# Patient Record
Sex: Male | Born: 1940
Health system: Southern US, Community
[De-identification: ages and names within clinical notes are randomized; demographics above are authoritative.]

## PROBLEM LIST (undated history)

## (undated) DIAGNOSIS — C801 Malignant (primary) neoplasm, unspecified: Secondary | ICD-10-CM

## (undated) DIAGNOSIS — H409 Unspecified glaucoma: Secondary | ICD-10-CM

## (undated) DIAGNOSIS — G610 Guillain-Barre syndrome: Secondary | ICD-10-CM

## (undated) DIAGNOSIS — D649 Anemia, unspecified: Secondary | ICD-10-CM

## (undated) DIAGNOSIS — K5730 Diverticulosis of large intestine without perforation or abscess without bleeding: Secondary | ICD-10-CM

## (undated) DIAGNOSIS — Z8781 Personal history of (healed) traumatic fracture: Secondary | ICD-10-CM

## (undated) DIAGNOSIS — Z860100 Personal history of colon polyps, unspecified: Secondary | ICD-10-CM

## (undated) DIAGNOSIS — N401 Enlarged prostate with lower urinary tract symptoms: Secondary | ICD-10-CM

## (undated) DIAGNOSIS — J189 Pneumonia, unspecified organism: Secondary | ICD-10-CM

## (undated) DIAGNOSIS — E039 Hypothyroidism, unspecified: Secondary | ICD-10-CM

## (undated) DIAGNOSIS — N138 Other obstructive and reflux uropathy: Secondary | ICD-10-CM

## (undated) DIAGNOSIS — R011 Cardiac murmur, unspecified: Secondary | ICD-10-CM

## (undated) DIAGNOSIS — I2510 Atherosclerotic heart disease of native coronary artery without angina pectoris: Secondary | ICD-10-CM

## (undated) DIAGNOSIS — I6523 Occlusion and stenosis of bilateral carotid arteries: Secondary | ICD-10-CM

## (undated) DIAGNOSIS — H9319 Tinnitus, unspecified ear: Secondary | ICD-10-CM

## (undated) DIAGNOSIS — E785 Hyperlipidemia, unspecified: Secondary | ICD-10-CM

## (undated) DIAGNOSIS — Z8551 Personal history of malignant neoplasm of bladder: Secondary | ICD-10-CM

## (undated) DIAGNOSIS — I8393 Asymptomatic varicose veins of bilateral lower extremities: Secondary | ICD-10-CM

## (undated) DIAGNOSIS — I714 Abdominal aortic aneurysm, without rupture, unspecified: Secondary | ICD-10-CM

## (undated) DIAGNOSIS — I350 Nonrheumatic aortic (valve) stenosis: Secondary | ICD-10-CM

## (undated) DIAGNOSIS — I8390 Asymptomatic varicose veins of unspecified lower extremity: Secondary | ICD-10-CM

## (undated) DIAGNOSIS — Z8709 Personal history of other diseases of the respiratory system: Secondary | ICD-10-CM

## (undated) HISTORY — DX: Unspecified glaucoma: H40.9

## (undated) HISTORY — DX: Pneumonia, unspecified organism: J18.9

## (undated) HISTORY — DX: Asymptomatic varicose veins of unspecified lower extremity: I83.90

## (undated) HISTORY — DX: Cardiac murmur, unspecified: R01.1

## (undated) HISTORY — DX: Malignant (primary) neoplasm, unspecified: C80.1

## (undated) HISTORY — PX: TONSILLECTOMY: SUR1361

## (undated) HISTORY — DX: Guillain-Barre syndrome: G61.0

## (undated) HISTORY — DX: Hypothyroidism, unspecified: E03.9

## (undated) HISTORY — DX: Abdominal aortic aneurysm, without rupture, unspecified: I71.40

## (undated) HISTORY — DX: Hyperlipidemia, unspecified: E78.5

## (undated) HISTORY — PX: OTHER SURGICAL HISTORY: SHX169

---

## 1998-08-02 ENCOUNTER — Ambulatory Visit: Admission: RE | Admit: 1998-08-02 | Discharge: 1998-08-02 | Payer: Self-pay | Admitting: *Deleted

## 1999-12-10 DIAGNOSIS — Z8781 Personal history of (healed) traumatic fracture: Secondary | ICD-10-CM

## 1999-12-10 DIAGNOSIS — Z8709 Personal history of other diseases of the respiratory system: Secondary | ICD-10-CM

## 1999-12-10 DIAGNOSIS — J189 Pneumonia, unspecified organism: Secondary | ICD-10-CM

## 1999-12-10 DIAGNOSIS — G610 Guillain-Barre syndrome: Secondary | ICD-10-CM

## 1999-12-10 HISTORY — DX: Personal history of (healed) traumatic fracture: Z87.81

## 1999-12-10 HISTORY — DX: Pneumonia, unspecified organism: J18.9

## 1999-12-10 HISTORY — DX: Guillain-Barre syndrome: G61.0

## 1999-12-10 HISTORY — DX: Personal history of other diseases of the respiratory system: Z87.09

## 2000-04-28 ENCOUNTER — Encounter: Payer: Self-pay | Admitting: *Deleted

## 2000-04-28 ENCOUNTER — Inpatient Hospital Stay (HOSPITAL_COMMUNITY): Admission: AD | Admit: 2000-04-28 | Discharge: 2000-05-06 | Payer: Self-pay | Admitting: Family Medicine

## 2000-04-29 ENCOUNTER — Encounter: Payer: Self-pay | Admitting: *Deleted

## 2000-05-01 ENCOUNTER — Encounter: Payer: Self-pay | Admitting: Family Medicine

## 2000-05-01 HISTORY — PX: VIDEO ASSISTED THORACOSCOPY (VATS)/EMPYEMA: SHX6172

## 2000-05-02 ENCOUNTER — Encounter: Payer: Self-pay | Admitting: Family Medicine

## 2000-05-03 ENCOUNTER — Encounter: Payer: Self-pay | Admitting: Thoracic Surgery (Cardiothoracic Vascular Surgery)

## 2000-05-04 ENCOUNTER — Encounter: Payer: Self-pay | Admitting: Family Medicine

## 2000-05-05 ENCOUNTER — Encounter: Payer: Self-pay | Admitting: Thoracic Surgery (Cardiothoracic Vascular Surgery)

## 2000-05-05 ENCOUNTER — Encounter: Payer: Self-pay | Admitting: Family Medicine

## 2000-05-06 ENCOUNTER — Encounter: Payer: Self-pay | Admitting: Family Medicine

## 2000-05-09 ENCOUNTER — Encounter: Admission: RE | Admit: 2000-05-09 | Discharge: 2000-05-09 | Payer: Self-pay | Admitting: Family Medicine

## 2000-05-14 ENCOUNTER — Encounter: Payer: Self-pay | Admitting: Thoracic Surgery (Cardiothoracic Vascular Surgery)

## 2000-05-14 ENCOUNTER — Encounter
Admission: RE | Admit: 2000-05-14 | Discharge: 2000-05-14 | Payer: Self-pay | Admitting: Thoracic Surgery (Cardiothoracic Vascular Surgery)

## 2000-05-16 ENCOUNTER — Encounter: Admission: RE | Admit: 2000-05-16 | Discharge: 2000-05-16 | Payer: Self-pay | Admitting: Family Medicine

## 2000-05-16 ENCOUNTER — Encounter: Payer: Self-pay | Admitting: Family Medicine

## 2000-05-17 ENCOUNTER — Inpatient Hospital Stay (HOSPITAL_COMMUNITY): Admission: EM | Admit: 2000-05-17 | Discharge: 2000-05-29 | Payer: Self-pay | Admitting: Emergency Medicine

## 2000-05-17 ENCOUNTER — Encounter: Payer: Self-pay | Admitting: Family Medicine

## 2000-05-29 ENCOUNTER — Inpatient Hospital Stay (HOSPITAL_COMMUNITY)
Admission: RE | Admit: 2000-05-29 | Discharge: 2000-06-20 | Payer: Self-pay | Admitting: Physical Medicine & Rehabilitation

## 2000-06-24 ENCOUNTER — Encounter
Admission: RE | Admit: 2000-06-24 | Discharge: 2000-08-04 | Payer: Self-pay | Admitting: Physical Medicine & Rehabilitation

## 2000-07-25 ENCOUNTER — Emergency Department (HOSPITAL_COMMUNITY): Admission: EM | Admit: 2000-07-25 | Discharge: 2000-07-25 | Payer: Self-pay | Admitting: Emergency Medicine

## 2000-07-25 ENCOUNTER — Encounter: Payer: Self-pay | Admitting: Emergency Medicine

## 2000-11-06 ENCOUNTER — Encounter (INDEPENDENT_AMBULATORY_CARE_PROVIDER_SITE_OTHER): Payer: Self-pay | Admitting: Specialist

## 2000-11-06 ENCOUNTER — Other Ambulatory Visit: Admission: RE | Admit: 2000-11-06 | Discharge: 2000-11-06 | Payer: Self-pay | Admitting: Gastroenterology

## 2002-12-09 DIAGNOSIS — Z8551 Personal history of malignant neoplasm of bladder: Secondary | ICD-10-CM

## 2002-12-09 DIAGNOSIS — C801 Malignant (primary) neoplasm, unspecified: Secondary | ICD-10-CM

## 2002-12-09 HISTORY — DX: Malignant (primary) neoplasm, unspecified: C80.1

## 2002-12-09 HISTORY — DX: Personal history of malignant neoplasm of bladder: Z85.51

## 2003-06-07 ENCOUNTER — Encounter (INDEPENDENT_AMBULATORY_CARE_PROVIDER_SITE_OTHER): Payer: Self-pay | Admitting: Specialist

## 2003-06-07 ENCOUNTER — Observation Stay (HOSPITAL_COMMUNITY): Admission: RE | Admit: 2003-06-07 | Discharge: 2003-06-08 | Payer: Self-pay | Admitting: Urology

## 2003-06-07 HISTORY — PX: TRANSURETHRAL RESECTION OF BLADDER TUMOR WITH MITOMYCIN-C: SHX6459

## 2005-11-15 ENCOUNTER — Ambulatory Visit: Payer: Self-pay | Admitting: Gastroenterology

## 2005-11-29 ENCOUNTER — Ambulatory Visit: Payer: Self-pay | Admitting: Gastroenterology

## 2007-01-28 ENCOUNTER — Encounter: Admission: RE | Admit: 2007-01-28 | Discharge: 2007-01-28 | Payer: Self-pay | Admitting: Family Medicine

## 2007-03-26 ENCOUNTER — Ambulatory Visit: Payer: Self-pay | Admitting: Vascular Surgery

## 2007-12-29 ENCOUNTER — Emergency Department (HOSPITAL_COMMUNITY): Admission: EM | Admit: 2007-12-29 | Discharge: 2007-12-29 | Payer: Self-pay | Admitting: Emergency Medicine

## 2007-12-31 ENCOUNTER — Ambulatory Visit (HOSPITAL_BASED_OUTPATIENT_CLINIC_OR_DEPARTMENT_OTHER): Admission: RE | Admit: 2007-12-31 | Discharge: 2007-12-31 | Payer: Self-pay | Admitting: Orthopaedic Surgery

## 2007-12-31 HISTORY — PX: OTHER SURGICAL HISTORY: SHX169

## 2009-03-12 IMAGING — CR DG WRIST COMPLETE 3+V*R*
4 series · 4 of 4 positions shown · non-contrast
Comparison: none

CLINICAL DATA: 66-year-old who fell.
 RIGHT WRIST ? 4 VIEW:

[x wrist pa right]
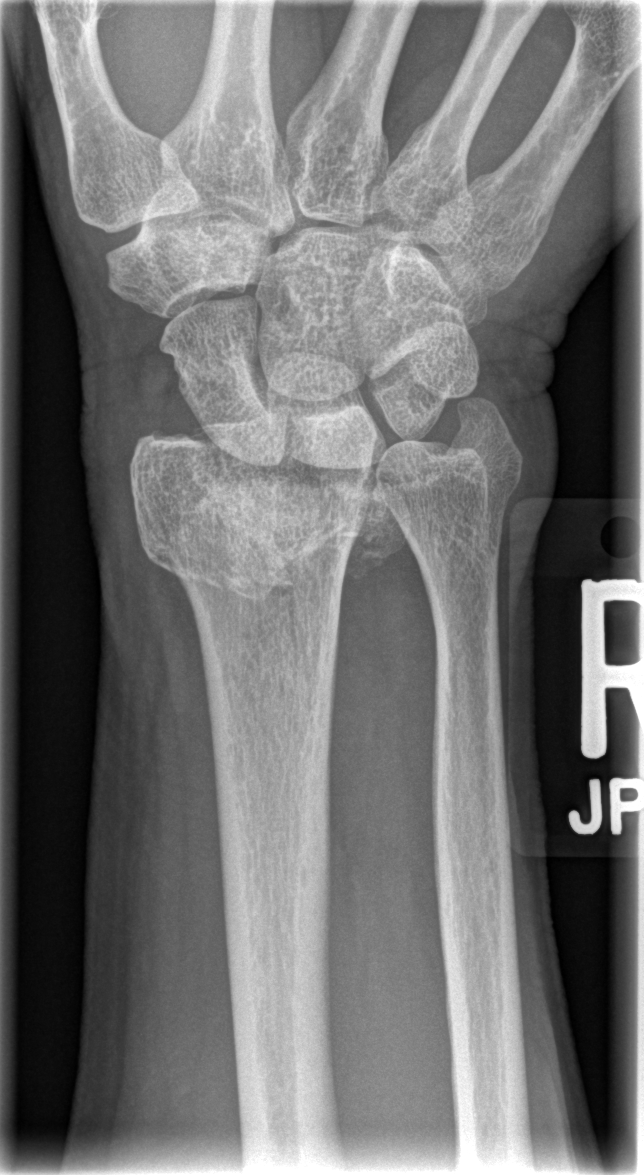

[x wrist obl right]
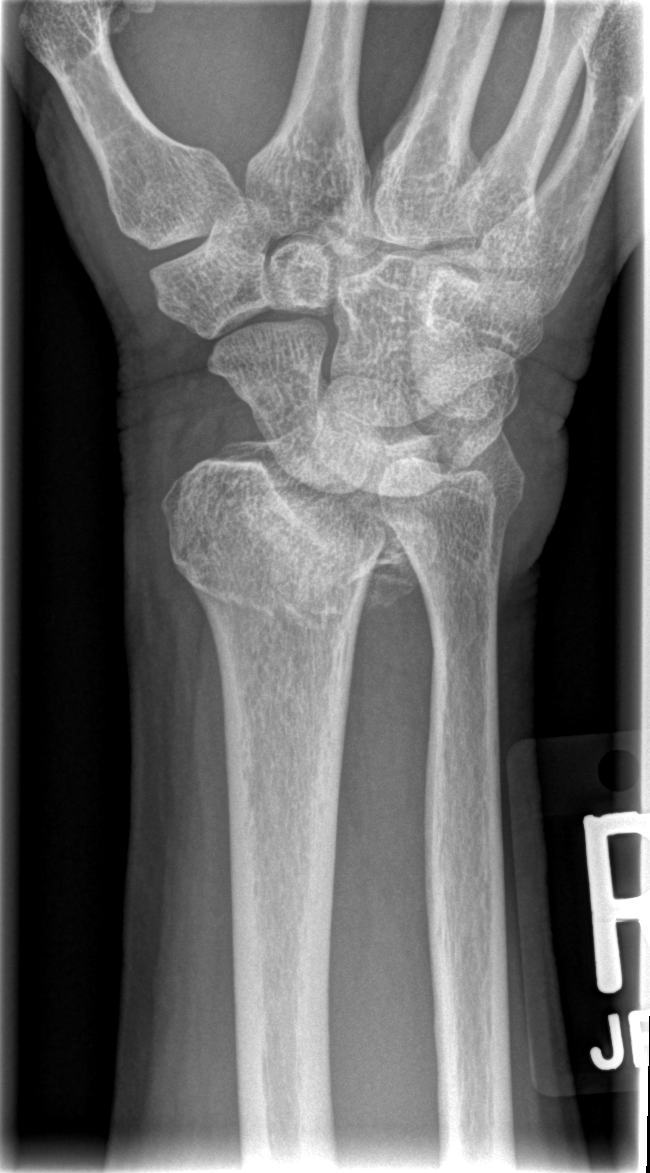

[x wrist lat right]
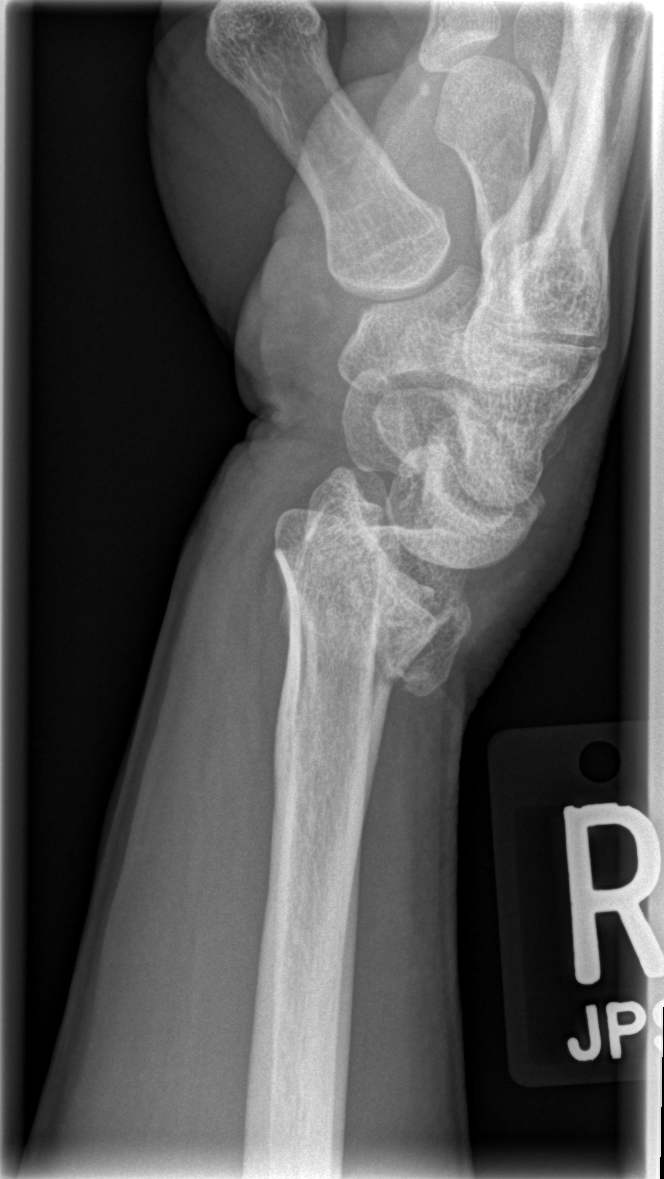

[x navicular]
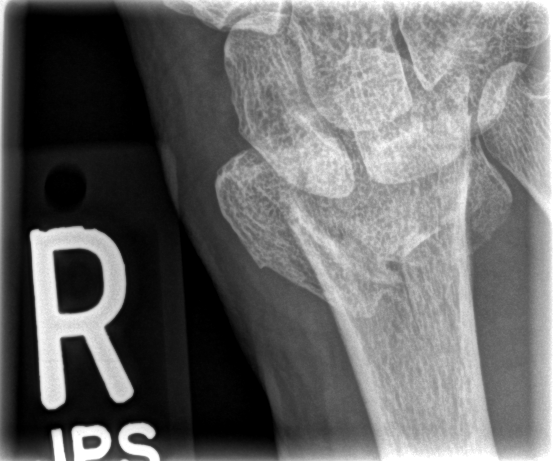

[4 of 4 positions shown; findings below may reference images not displayed]

FINDINGS: There is a comminuted intraarticular dorsally impacted distal radius fracture.  No ulnar fracture is seen.  The carpal bones are intact.
IMPRESSION: Dorsally impacted comminuted intraarticular distal radius fracture.

## 2009-03-29 ENCOUNTER — Encounter: Admission: RE | Admit: 2009-03-29 | Discharge: 2009-03-29 | Payer: Self-pay | Admitting: Family Medicine

## 2010-08-23 ENCOUNTER — Encounter (INDEPENDENT_AMBULATORY_CARE_PROVIDER_SITE_OTHER): Payer: Self-pay | Admitting: *Deleted

## 2011-01-08 NOTE — Letter (Signed)
Summary: Colonoscopy Date Change Letter  [facility]  [address] [place], [place] [address]   Phone: [phone]  Fax: [phone]      August 23, 2010 MRN: [id number]   Anthony Noble [address] [place], [place]  [address]   Dear Mr. Wolaver,   Previously you were recommended to have a repeat colonoscopy around this time. Your chart was recently reviewed by Dr. Judie Petit T. Russella Dar of [facility]. Follow up colonoscopy is now recommended in December 2016. This revised recommendation is based on current, nationally recognized guidelines for colorectal cancer screening and polyp surveillance. These guidelines are endorsed by the American Cancer Society, [employer] on Colorectal Cancer as well as numerous other major medical organizations.  Please understand that our recommendation assumes that you do not have any new symptoms such as bleeding, a change in bowel habits, anemia, or significant abdominal discomfort. If you do have any concerning GI symptoms or want to discuss the guideline recommendations, please call to arrange an office visit at your earliest convenience. Otherwise we will keep you in our reminder system and contact you 1-2 months prior to the date listed above to schedule your next colonoscopy.  Thank you,  Judie Petit T. Russella Dar, M.D.  [facility] [phone]

## 2011-04-23 NOTE — Op Note (Signed)
NAMENYLAN, NEVEL NO.:  [id number]   MEDICAL RECORD NO.:  [id number]          PATIENT TYPE:  AMB   LOCATION:  DSC                          FACILITY:  [facility]   PHYSICIAN:  Vanita Panda. Magnus Ivan, M.D.DATE OF BIRTH:  1941-08-04   DATE OF PROCEDURE:  12/31/2007  DATE OF DISCHARGE:                               OPERATIVE REPORT   PREOPERATIVE DIAGNOSIS:  Right unstable intra-articular distal radius  fracture.   POSTOPERATIVE DIAGNOSIS:  Right unstable intra-articular distal radius  fracture.   PROCEDURE:  Open reduction internal fixation of right unstable intra-  articular distal radius fracture.   IMPLANTS:  Synthes volar anatomic locking plate.   SURGEON:  Doneen Poisson, MD   ANESTHESIA:  1. Right shoulder interscalene block.  2. General anesthesia.   ANTIBIOTICS:  1 gram IV Ancef.   TOURNIQUET TIME:  1 hour 14 minutes.   COMPLICATIONS:  None.   INDICATIONS:  Briefly Mr. Xu is a right-hand-dominant 70 year old  who two days ago slipped on icy surface on his driveway falling on his  outstretched right wrist.  He was found to have a displaced intra-  articular distal radius fracture.  I saw him in the emergency room and  had the forearm closed reduction to improve the alignment.  He now  presents for definitive fixation of this intra-articular unstable  fracture.  The risks, benefits of surgery have been explained to him and  well understood.  He agrees proceed surgery.   PROCEDURE DESCRIPTION:  After informed consent, appropriate right arm  was marked and interscalene block was obtained by anesthesia.  He was  then brought to the operating room, placed supine on the operating  table.  General anesthesia was then obtained.  Of note, the patient had  asked me prior to coming to the room if I would provide a steroid  injection in a heel spur on his right foot while he was asleep and he  did put this on the permit and so I did oblige before  starting the case  and after cleaning his heel with alcohol provided a 1 mL 0.5% plain  Sensorcaine with 1 mL of 40 Depo-Medrol in the heel without  complications.  A band aid was placed over this.  I then placed a  nonsterile tourniquet around his upper right arm.  His arm was prepped  and draped from the upper arm down to the fingers with DuraPrep and  sterile drapes.  A time-out was called and he was identified as the  correct patient, correct extremity.  I then used Esmarch to wrap out the  wrist and hand.  The tourniquet inflated to 200 mmHg pressure.  I then  took a volar approach to the wrist and the skin was cut with knife and I  went down to the interval between the flexor carpi radialis tendon and  the radial artery.  I was easily able to see the pronator quadratus  tendon.  I took this down as a sleeve from radial to ulnar to expose the  volar cortex of this radius.  Slight reduction  maneuver was performed  and I was able to tease the fracture into reduced position.  I then used  a Synthes standard volar locking plate and fashioned this along the  volar cortex.  This was performed under direct fluoroscopy I then  secured bicortical screw in the slotted diaphyseal portion of the plate  to allow me for positioning the plate and along the metaphyseal and  articular aspect of the distal radius.  I then used locking screws to  further secure the fracture.  I put the wrist through a range of motion  under direct fluoroscopy and the wrist did move normally and there was  no widening of any interval of the carpal bones of the wrist.  I then  secured the wrist with two additional locking screws.  In the diaphysis.  I then copiously irrigated the wound and closed the pronator quadratus  back to this position with a 2-0 Vicryl suture.  I then used interrupted  3-0 Vicryl suture in subcutaneous tissue and interrupted 3-0 nylon on  the skin.  Adaptic followed by well-padded sterile dressing  applied.  I  put the patient's wrist in a removable Velcro wrist splint.  The  tourniquet was let down, the fingers did pink nicely.  The patient was  awakened, extubated, taken to recovery room in stable condition.  All  final counts were correct and no complications noted.      Vanita Panda. Magnus Ivan, M.D.  Electronically Signed     CYB/MEDQ  D:  12/31/2007  T:  12/31/2007  Job:  [id number]

## 2011-04-23 NOTE — Consult Note (Signed)
NAMEKALANI, STHILAIRE NO.:  [id number]   MEDICAL RECORD NO.:  [id number]          PATIENT TYPE:  EMS   LOCATION:  MAJO                         FACILITY:  [facility]   PHYSICIAN:  Vanita Panda. Magnus Ivan, M.D.DATE OF BIRTH:  [birth year]   DATE OF CONSULTATION:  12/29/2007  DATE OF DISCHARGE:                                 CONSULTATION   REASON FOR CONSULTATION:  Right displaced intra-articular distal radius  fracture.   HISTORY OF PRESENT ILLNESS:  Briefly, Marlinda Mike is a 70 year old right-hand-  dominant male who is a very active individual.  He slipped in the snow  on his driveway this morning and fell on an outstretched right wrist.  He had obvious deformity of his wrist and came to [facility]  Room for further evaluation and treatment.  X-ray showed a displaced  intra-articular distal radius fracture.  Orthopedic Surgery was called  for consultation for further management.  The patient denies any other  injuries, denies numbness and tingling in his fingers, and only reports  the pain from the deformity of the wrist.   PAST MEDICAL HISTORY:  1. Increased lipids.  2. Hypothyroid.   ALLERGIES:  SULFA.   MEDICATIONS:  Lipitor, Synthroid, baby aspirin, Omega-3, fish oil,  multivitamin.   SOCIAL HISTORY:  He denies smoking or drinking.  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   REVIEW OF SYSTEMS:  Negative for chest pain, shortness of breath, fever,  chills, nausea, vomiting.   VITAL SIGNS:  He is afebrile with stable vital signs.  GENERAL:  He is alert and oriented in no acute distress or obvious  discomfort.  EXTREMITIES:  Examination of the right upper extremity shows obvious  deformity with swelling at the wrist and an obvious dorsal displaced  fracture.  He has palpable pulses and normal sensation in his hand.  He  moves his fingers and thumb easily.  There is no tenderness of the elbow  or the  shoulder.  REMAINDER OF THE SYSTEMS:  Normal on exam.   X-rays reviewed and show a dorsally displaced intra-articular and  shortened distal radius fracture.   IMPRESSION:  This is a 70 year old right-hand-dominant male with a  displaced unstable right distal radius fracture.   PLAN:  In the emergency room, I was able to perform a hematoma block  after prepping the dorsum of the wrist with Betadine and alcohol, and  this was with 2% plain Xylocaine.  I then put him in 10 pounds of finger-  trap traction and then gradually increased this to 20 pounds in 5-minute  intervals.  Anatomically, he appeared to have much improved alignment of  the wrist; and I performed gentle reduction of this and placed him in a  well-padded plaster sugar-tong  splint.  Postreduction x-rays showed much better alignment.  He was  released from the ER in stable condition with close followup in my  office.  This will likely need an operative intervention, given this  gentleman's high level of activity, the unstable nature of the  fracture,  and this being his dominant wrist.  I will set up surgery at a later  date.      Vanita Panda. Magnus Ivan, M.D.  Electronically Signed     CYB/MEDQ  D:  12/29/2007  T:  12/29/2007  Job:  [id number]

## 2011-04-26 NOTE — Consult Note (Signed)
[place]  Patient:    Anthony Noble, Anthony Noble                        MRN: [id number] Proc. Date: 05/18/00 Adm. Date:  [id number] Disc. Date: [id number] Attending:  McDiarmid, Leighton Roach CC:         Family Practice                          Consultation Report  CHIEF COMPLAINT:  Cannot walk, vesiculations.  HISTORY OF PRESENT ILLNESS:  Anthony Noble is a 70 year old right-handed white male with a complicated history.  He was admitted in May of this year with right-sided empyema, had a VATS procedure to decorticate on the 21st.  On discharge, he did  well overall, but was active and then midway through this past week, began to feel weak.  He had some low back pain, Levaquin was stopped.  He had been on Lipitor for several years and that was stopped.  He has had multiple falls.  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  MRI of the lumbar spine and thoracic spine done to rule out epidural abscess and were largely unremarkable.  As mentioned, he was noted to be in SIADH.  He says that his arms are slightly weak this morning as well, although that is fairly new.  He has some tingling of his  feet.  REVIEW OF SYSTEMS:  Positive for back pain, leg pain and weakness.  No fever or  chills.  PAST MEDICAL HISTORY:  Significant for the recent VATS procedure for the right empyema, SIADH, hypothyroidism, tonsillectomy, which is remote, and dental extractions.  MEDICATIONS:  Currently, Synthroid, Percocet, Tylenol.  At home, he was also on  aspirin, vitamin E, iron, Pepto-Bismol, and he had been on Lipitor.  He had also been on Levaquin, that was stopped in case that might be causing toxicity.  ALLERGIES:  SEPTRA and LEVAQUIN.  SOCIAL HISTORY:  He is married with children.  Does smoke cigarettes, remotely. No alcohol use currently.  FAMILY HISTORY:  Positive for stroke and ovarian cancer, I believe.  PHYSICAL EXAMINATION:  Vital signs:   Temperature 97, pulse 84, respiratory rate 20, blood pressure 150/90.   HEAD:  Normocephalic, atraumatic.  NECK:  Supple without bruits.  LUNGS:  Clear to auscultation.  HEART:  Regular rate and rhythm, slightly tachycardic.  ABDOMEN:  Positive bowel sounds, soft and nontender.  EXTREMITIES:  Without edema.  NEUROLOGIC:  Mental status:  Awake and alert, fully oriented with normal language and comprehension.  Cranial nerves:  Pupils are equal and reactive, disk margins are poorly seen, visual fields are full to confrontation, extraocular movements aer intact.  Facial sensation is normal, facial motor activity is normal.  Hearing s intact.  Palate is symmetric and tongue is midline.  On the motor exam, I see no vesiculations anywhere when he is at rest.  He has mild bilateral upper extremity drift, fairly normal strength in the distal right upper extremity, but a little bit weak proximally, 3+/5, fairly weak in the left upper extremity with 3+ weakness  diffusely, lower extremities about 1/5 proximal, and 2-3/5 distal strength.  As  mentioned, no vesiculations to my exam.  Reflexes are absent diffusely.  He has no deep tendon reflexes.  He is areflexic.  Coordination:  Finger-to-nose is barely intact due to the weakness, cannot do heel-to-shin.  Sensory exam is  intact.  PRIMARY MODALITIES:  MRI of the thoracic and lumbar spine are unrevealing.  IMPRESSION:  Subacute progressive weakness in the lower extremities proceeding o the upper extremities, areflexia, and SIADH about three weeks after surgical procedure to me all add up to ______ syndrome.  RECOMMENDATIONS:  Lumbar puncture to look for elevated protein with normal white blood cells.  He needs to start on IVIg 400 mg/kg per day for five days.  We will need close monitoring of cardiac and respiratory function, subject to arrhythmias and respiratory compromise.  He will probably need long-term  rehabilitation. DD:  05/18/00 TD:  05/18/00 Job: [id number]

## 2011-04-26 NOTE — Discharge Summary (Signed)
[place]. [facility]  Patient:    Anthony Noble, Anthony Noble                        MRN: [id number] Adm. Date:  [id number] Disc. Date: 05/06/00 Attending:  McDiarmid, Anthony Roach. Dictator:   Anthony Noble, P.A. CC:         Anthony Noble. Anthony Noble, M.D.             Anthony Noble, M.D.                           Discharge Summary  DATE OF BIRTH:  [birth year]  ATTENDING SURGEON:  Dr. Charlett Noble.  PRIMARY CAREGIVER:  Dr. Rudi Noble, [facility].  FINAL DIAGNOSES: 1. Loculated right pleural effusion, empyema. 2. Postoperative anemia, not requiring transfusion. 3. Prominent nodal tissue in right hilar region on computerized tomographic    scan this admission.  Follow-up chest computerized tomography in three    months recommended.  SECONDARY DIAGNOSES: 1. Hypothyroidism. 2. Hyperlipidemia. 3. History of hematuria/urinary tract infection. 4. History of tobacco habituation, having quit in the early 1980s. 5. Status post tonsillectomy.  PROCEDURES:  May 01, 2000, bronchoscopy, right video-assisted thoracoscopy, drainage of a right pleural effusion, right minithoracotomy with decortication, Dr. Charlett Noble, surgeon.  The patient tolerated the procedure well and was transferred in stable and satisfactory condition to the recovery room.  DISPOSITION:  [name] is judged a suitable candidate for discharge on postoperative day #5, May 06, 2000.  He was having temperature elevations on his initial admission.  These have moderated and ameliorated by postoperative day #3.  He has been afebrile on postoperative days #4 and 5. He has been maintained on IV Rocephin during his whole hospitalization.  He will go home on p.o. Levaquin 500 mg daily for seven days.  His chest tube never did develop an air leak, and chest tubes were discontinued by postoperative day #4.  Pleural fluid showed no evidence of aerobic or anaerobic  contamination.  The studies were negative for Legionella, negative for acid-fast bacteria, negative for yeast and fungi.  Pathology showed granulation tissue with acute inflammation and necrosis.  No evidence of malignancy either in the decortication tissue or in the bronchial washings. Anthony Noble did experience postoperative anemia with a nadir, meaning low point, on postoperative day #6, a hemoglobin of 7.6.  It had risen to 7.8 by postoperative day #3.  He has not been symptomatic despite his anemia.  He will go home on iron supplementation.  He was relieved of all supplemental oxygen by postoperative day #2.  He was achieving oxygen saturations on room air at that time.  He was ambulating independently.  He has full GI tract function.  His appetite has returned.  His mental status has been clear throughout his hospitalization.  He has experienced no respiratory compromise, no cardiac dysrhythmias.  His incision is healing nicely.  There is no evidence of erythema, swelling, or drainage.  Pain is well controlled with oral analgesia.  DISCHARGE MEDICATIONS:  1. Percocet 5/325 1-2 tablets p.o. q.4-6h. p.r.n. pain.  2. Humibid L.A. 600 mg 2 p.o. b.i.d.  3. Levaquin 500 mg 1 p.o. q.d. x 7 days.  4. Chromagen twice daily with food.  5. Enteric-coated aspirin 81 mg daily.  6. Lipitor 10 mg at bedtime daily.  7. Synthroid 50 mcg daily.  8. Vitamin E 400 IU  daily.  9. Celebrex 100 mg b.i.d. 10. Zantac/Pepto-Bismol as needed. 11. Multivitamin daily.  ACTIVITY:  He may ambulate as tolerated.  He is asked not to drive for the next week.  DIET:  Low sodium, low cholesterol diet.  WOUND CARE:  He may bathe daily, which means shower, keeping his incisions clean and dry.  Sutures from chest tubes will be removed Wednesday, May 07, 2000, by his wife.  She is to paint the sutures with Betadine, place Steri-Strips over the incisions, and take out the sutures.  FOLLOW-UP:  He has an office  visit follow-up with Dr. Dorris Noble two weeks after his discharge.  He will get a chest x-ray one hour before the office visit is scheduled, and the chest x-ray will be taken at [facility] one hour before the visit.  HISTORY OF PRESENT ILLNESS:  Anthony Noble is a 70 year old male referred from the [facility]  He was demonstrated to have right-sided pneumonia with large right pleural effusion.  He describes 1-1/2 to 2 weeks of productive cough and right-sided chest pain.  He was treated with erythromycin May 15 through Apr 24, 2000.  Chest x-ray showed right-sided infiltrate on Apr 24, 2000.  He was started then on intramuscular Rocephin. He has had intermittent fevers during his illness.  Overall, he feels better than he did a week ago.  He did have some mild dyspnea, but this has resolved at the time of his admission.  Chest pain is well controlled with Percocet and Naprosyn.  He was referred to [facility] Apr 28, 2000, when follow-up x-rays showed large right pleural effusion and when his white blood cell count remained elevated at 17.9.  HOSPITAL COURSE:  Anthony Noble was admitted to [facility] Apr 28, 2000, and started on IV Rocephin.  His white cell count was 15.6 on admission.  He was having intermittent fevers in the 101-degree Fahrenheit range.  Chest x-ray showed loculated pleural effusion.  A CT scan of the chest was ordered on admission.  The CT of the chest, which was performed on Apr 29, 2000, showed a moderate amount of pleural effusion on the right.  The largest amount is loculated in the posterior and lateral component.  There is also a loculated medial component anteriorly.  There is also associated compressive atelectasis of the right lung, as well as a patchy right lung air space opacity with air bronchograms.  There is a mildly prominent soft tissue mass in the right hilar region at the  level of the distal right main pulmonary  artery.  No definite discrete mass is seen.  There is mild narrowing of the right mainstem bronchus due to compressive effects of the adjacent posterior loculated fluid.  A 1.3 cm right paratracheal lymph node is noted.  Minimal left pleural effusion noted posteriorly.  The mildly prominent nodal tissue in the right hilar region and right paratracheal region most likely represents mild reactive adenopathy.  A follow-up chest CT in three months would be useful for excluding an enlarging right hilar mass or progressive adenopathy.  Anthony Noble was seen here on Apr 29, 2000, by Dr. Edwyna Shell [facility], who recommended drainage of the right pleural effusion with decortication.  Due to scheduling difficulties, he was unable to perform the procedure.  Dr. Dorris Noble, in sequence, saw Anthony Noble.  Reviewed the risks and benefits.  Anthony Noble elected to undergo the treatment.  This was done on May 01, 2000.  A right video-assisted thoracoscopy with decortication and drainage of right pleural effusion was done.  The patient transferred the procedure well.  As dictated above, the pleural fluid was negative for aerobic, anaerobic, Legionella, acid-fast bacteria, yeast, and fungi.  Pathology of the tissue showed granulation tissue with acute inflammation and necrosis.  Abundant white blood cells were also seen.  On postoperative day #1, Anthony Noble was achieving 96% oxygen saturation on 2 L nasal cannula.  His hemoglobin was 8.6.  He was still with elevated temperature of 101.2.  He was in a sinus rhythm and remained in sinus rhythm throughout his hospitalization.  Chest tube had 100 cc per eight-hour shift, with no air leak.  On postoperative day #2, he was achieving 90% oxygen saturations on room air.  He still was febrile.  His hemoglobin was 7.6, a low point during his hospitalization.  He was off all  supplemental oxygen, ambulating.  Pain was controlled well with oral analgesia.  On postoperative day #3, his elevated temperatures were ameliorating.  He had been undergoing aggressive incentive spirometry throughout the postoperative period.  He was achieving 92% oxygen saturations on room air.  There was decreased chest tube output.  The PCA morphine pump was discontinued.  This had sustained him in the postoperative period.  Hemoglobin had risen from 7.6 on postoperative day #2 to 7.8.  On postoperative day #4, he was achieving 94% oxygen saturations on room air.  He was ambulating well.  His incision was healing nicely.  There was no drainage from the chest tube, no air leak.  The chest tube was discontinued, and he was judged a suitable candidate for discharge on postoperative day #5, going home on Levaquin, iron supplementation, and follow-up CT scan for the right hilar mass, thought to be a reactive lymph node currently, but would bear watching. DD:  05/05/00 TD:  05/06/00 Job: [id number]

## 2011-04-26 NOTE — H&P (Signed)
[place]. [facility]  Patient:    Anthony Noble, Anthony Noble                        MRN: [id number] Adm. Date:  [id number] Attending:  McDiarmid, Leighton Roach. Dictator:   Cheree Ditto, M.D. CC:         Dr. Tona Sensing or Jenetta Loges.             [facility]                         History and Physical  CHIEF COMPLAINT:  Right-sided chest pain.  HISTORY OF PRESENT ILLNESS:  A 70 year old male referred from [facility] for admission and evaluation with right-sided pneumonia and large pleural effusion.  He describes 1-1/2 to 2 weeks of productive cough and right-sided chest pain.  He was treated with azithromycin from 04/22/00 to 04/24/00.  After a chest x-ray revealed a right-sided infiltrate on 5/17, he was given IM Rocephin and sent home from clinic on Avelox.  He has had intermittent fevers during this illness.  Overall he feels better than he did a week ago.  His mild dyspnea has resolved.  His chest pain is well controlled with Percocet and Naprosyn.  He was referred to [facility] today when his follow-up chest x-ray showed a large right pleural effusion and his white blood count remained at 17.9.  PAST MEDICAL HISTORY:  Hyperlipidemia.  Hypothyroidism.  History of hematuria and possible UTI.  Obesity.  PAST SURGICAL HISTORY:  Tonsillectomy.  MEDICATIONS: 1. Lipitor 10 mg q.d. 2. Synthroid 0.05 q.d. 3. Multivitamin q.d. 4. Aspirin 81 mg q.d. 5. Vitamin E 400 mg q.d. 6. Zantac and Pepto-Bismol p.r.n.  ALLERGIES:  SEPTRA.  FAMILY HISTORY:  Mother died at 3 with renal failure and ovarian cancer.  His maternal grandmother had cancer.  His father is living at 87 with cataracts and is status post carotid endarterectomy.  SOCIAL HISTORY:  A 25 pack-year history of smoking.  He quit in the early 1980s.  He drinks one alcoholic beverage every Friday.  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  REVIEW OF  SYSTEMS:  No weight loss.  Occasional night sweats.  No fevers prior to this illness.  No hemoptysis.  No blood in the stool.  As above, otherwise unremarkable.  PHYSICAL EXAMINATION:  VITAL SIGNS:  Temperature 99.3, heart rate 100, respiratory rate 20, blood pressure 140/80, O2 saturations 91% on room air.  GENERAL:  He is pleasant and in no distress.  HEENT:  Pupils equal, round, and reactive to light and accommodation. Extraocular movements are intact.  Oropharynx is clear.  NECK:  Supple, without lymphadenopathy.  HEART:  Regular rate and rhythm with no murmur.  LUNGS:  Normal respiratory effort.  There are decreased breath sounds with crackles and dullness to percussion one-half to two-thirds the way up on the right.  ABDOMEN:  Mildly obese.  Positive bowel sounds.  Soft, nontender, no organomegaly.  EXTREMITIES:  No clubbing, cyanosis, or edema.  NEUROLOGIC:  Cranial nerves II-XII are intact.  He has normal strength and reflexes in the upper and lower extremities bilaterally.  LABORATORY DATA:  On 04/28/00 white count 17.9, hemoglobin 11.8, hematocrit 35.9, platelets 548.  MCV 87.5, RDW 14.  On 04/28/00 chest x-ray (PA and lateral) shows a right-sided infiltrate and effusion.  ASSESSMENT AND PLAN:  A 70 year old  male who presents with pneumonia and a right-sided pleural effusion.  #1 - RESPIRATORY:  We will start IV Levaquin.  Check decubitus.  X-ray to see if fluid layers out.  Consider thoracentesis.  Pleural effusion is less likely peripneumonic, but given history of smoking, postobstructive pneumonia from lung cancer is in the differential.  Follow-up chest x-ray and CBC in the a.m. Naprosyn and Darvocet for pleuritic chest pain.  #2 - HYPOTHYROIDISM:  Continue Synthroid.  #3 - HYPERLIPIDEMIA:  Continue Lipitor.  #4 - HISTORY OF GASTRITIS SYMPTOMS:  Given Pepcid while on Naprosyn. DD:  05/01/00 TD:  05/02/00 Job: [id number]

## 2011-04-26 NOTE — H&P (Signed)
[place]. [facility]  Patient:    Anthony Noble, Anthony Noble                        MRN: [id number] Adm. Date:  [id number] Disc. Date: [id number] Attending:  McDiarmid, Leighton Roach. Dictator:   Lavonia Dana, M.D.                         History and Physical  CHIEF COMPLAINT:  Lower extremity weakness.  HISTORY OF PRESENT ILLNESS:  This is a 70 year old who was admitted Apr 29, 1999 for a pneumonia that failed outpatient treatment and evolved into a right-sided empyema.  At that hospitalization he underwent VATS procedure, drainage of the effusion, and decortication.  He was then discharged on Levaquin and was doing well in follow-up until the past three days when he has developed relatively sudden onset of lower extremity weakness.  He has had multiple falls.  It is located mostly in his hips.  He has had some knee pain which he describes as more in the muscles surrounding his knee.  He has had no bowel or bladder incontinence.  He had been seen at [facility] and laboratory work was obtained on June 7, showing white count of 12.4, platelets of 967, hemoglobin of 10, and hematocrit of 30.  His sodium at that time was 133 and a TSH was normal, along with T4 and T3.  The patient notably has a history of hypothyroidism.  He was also discontinued on his Lipitor at that time.  He had follow-up with Dr. Dorris Fetch, his CVTS surgeon, who had ordered a CT of the chest and abdomen which results are pending at [facility].  REVIEW OF SYSTEMS:  He is not having any fevers.  He has been out playing golf up until three days ago.  He has described some possible visual changes, some heat intolerance.  Some slight constipation with bowel movements every other day.  No incontinence of urine, no incontinence of stool.  He has paresthesias in his waist and upper legs.  There is no radiating pain into his feet.  No URI symptoms.  He is having minimal chest discomfort from his procedure.   He has draining site from where his chest tube was taken out, which his wife has been caring for.  He is having no diplopia, no dyspnea or shortness of breath or orthopnea.  He has had a slight macular rash from the tape that has also shown up on his chest without pruritus.  He has had no personality changes or slurred speech.  PAST MEDICAL HISTORY:  Hyperlipidemia, hypothyroidism, history of hematuria, questionable UTI.  PAST SURGICAL HISTORY:  VATS procedure and decortication as above and tonsillectomy.  MEDICATIONS: 1. Lipitor 10 mg q.d. which is discontinued. 2. Synthroid 0.05 mg q.d. 3. Multivitamin. 4. Aspirin. 5. Vitamin E. 6. Zantac. 7. Pepto-Bismol p.r.n.  ALLERGIES:  SEPTRA.  FAMILY HISTORY:  His mother died at 26 with renal failure and ovarian cancer. Paternal grandmother had cancer.  Father living at 67 with cataracts.  Had carotid endarterectomy.  SOCIAL HISTORY:  He has a 24-pack-year history of smoking, quit in the early 80s.  He drinks one alcoholic beverage a day.  He plays golf avidly.  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  PHYSICAL EXAMINATION:  VITAL SIGNS:  He is afebrile, blood pressure 151/92, heart rate 192, 97% on room air.  GENERAL:  He is a well-appearing gentleman in no distress lying on his left side because his legs ache if he lays on his back.  HEENT:  Normocephalic, atraumatic.  TMs clear bilaterally.  Oropharynx moist, no erythema.  NECK:  Supple, no JVD, no lymphadenopathy.  CHEST:  Clear to auscultation bilaterally.  No wheezes, rales, or rhonchi.  He has scars from his thoracotomy and a healing chest tube wound which is no drainage or erythema.  CARDIOVASCULAR:  Normal S1, S2, regular rhythm.  ABDOMEN:  Soft, positive bowel sounds.  There is ecchymosis from heparin injection.  EXTREMITIES:  Lower extremities show no evidence of atrophy.  There are fasciculations noted in the calves bilaterally and in his  quadriceps intermittently.  NEUROLOGIC:  Extraocular movements are intact.  His pupils are equal, round, and reactive to light.  Funduscopic:  His discs appear normal bilaterally. Tongue is midline.  He has a normal gag.  No facial asymmetry.  Strength in upper extremities is equal, 5/5 bilaterally.  His upper extremity reflexes are symmetric.  Lower extremity reveals normal strength in his ankle dorsiflexors and plantar flexors.  He has downgoing Babinskis bilaterally.  He has inability to raise his entire leg off of the bed, left is slightly stronger than the right, but his proximal hip muscles show significant weakness bilaterally.  His sensation is grossly intact.  We were unable to elicit his patellar reflexes bilaterally.  LABORATORY DATA:  Sodium 115, potassium 4.1, chloride 81, bicarb 24, glucose 115, BUN 15, creatinine 0.7, calcium 8.6, total protein 7.8, albumin 3, AST 16, ALT 35, alkaline phosphatase 124, bilirubin 0.7.  White count 18.2, hemoglobin 10.7, hematocrit 32, MCV 79.5, platelets 967, 86% neutrophils, no eosinophilia.  ASSESSMENT/PLAN:  A 70 year old with recent history of pulmonary empyema, status post decortication and drainage, who now presents with acute onset of proximal leg weakness bilaterally and leukocytosis.  Most worrisome would be epidural abscess or other infectious process or other mass process of the spinal cord.  Will immediately get MRI of the T and L spine to rule out anatomic lesion.  If positive, we will consult neurosurgery.  Other possibilities could be a myositis secondary to medications.  It appears this is not thyroid related.  Will check a CK.  Will consider neurologic consult. In the meantime we will admit for further observation.DD:  05/17/00 TD:  05/17/00 Job: [id number]

## 2011-04-26 NOTE — Op Note (Signed)
   NAME:  Anthony Noble, Anthony Noble                      ACCOUNT NO.:  [id number]   MEDICAL RECORD NO.:  [id number]                   PATIENT TYPE:  AMB   LOCATION:  DAY                                  FACILITY:  [facility]   PHYSICIAN:  Excell Seltzer. Annabell Howells, M.D.                 DATE OF BIRTH:  08-19-1941   DATE OF PROCEDURE:  06/07/2003  DATE OF DISCHARGE:                                 OPERATIVE REPORT   PREOPERATIVE DIAGNOSIS:  Bladder tumor.   POSTOPERATIVE DIAGNOSIS:  Bladder tumor.   PROCEDURES:  1. Cystoscopy.  2. Transverse resection of bladder tumor (approximately 3 cm).  3. Instillation of mitomycin-C.   SURGEON:  Excell Seltzer. Annabell Howells, M.D.   ASSISTANT:  Melvyn Novas, M.D.   ANESTHESIA:  General.   ESTIMATED BLOOD LOSS:  Minimal.   DRAINS:  Foley catheter.   SPECIMENS:  TUR of the bladder chips.   DESCRIPTION OF PROCEDURE:  Following the administration of antibiotics and  anesthesia, the patient was prepped and draped in the dorsal supine position  and a cystoscopy was performed using 22-French sheath and 12_________.  The  entire bladder was visualized.  Both ureteral orifices were located in the  normal anatomic positions.  A questionable 3 cm papillary-appearing tumor  was present on the right lateral bladder wall.  There were no mucosal  abnormalities.  Following cystoscopy the cystoscope was removed.  The  urethra was calibrated to 30-French.  A 28-French resectoscope sheath was  placed using the obturator.  We proceeded to resect the tumor in its  entirety, using pier-cutting current.  The base and edges of the resection  bladder was then fulgurated, using Bovie cautery.  The bladder tips were  evacuated using Toomey syringe.  The bladder was then reinspected; there was  no evidence of any bleeding.  There were no retained tumor chips.  A Foley  catheter was then placed and the bladder emptied.  Then 30 mg of mitomycin-C  30 cc was then instilled into the bladder through the  Foley catheter, and  then the Foley catheter was clamped.  The procedure was then terminated.    DISPOSITION:  The patient was taken to the recovery room in stable  condition.  He will have his Foley catheter unclamped in 30 min.      Melvyn Novas, M.D.                      Excell Seltzer. Annabell Howells, M.D.    DK/MEDQ  D:  06/07/2003  T:  06/07/2003  Job:  [id number]

## 2011-04-26 NOTE — Discharge Summary (Signed)
. [facility]  Patient:    Anthony Noble, Anthony Noble                   MRN: [id number] Adm. Date:  [id number] Disc. Date: 06/20/00 Attending:  Herold Harms Dictator:   Mcarthur Rossetti. Angiulli, P.A. CC:         Daniel L. Thomasena Edis, M.D.             Catherine A. Orlin Hilding, M.D.             Wayne A. Sheffield Slider, M.D.                           Discharge Summary  DISCHARGE DIAGNOSES:  1. Guillain-Barre syndrome.  2. Syndrome of inappropriate antidiuretic hormone, resolved.  3. Hypothyroidism.  4. History of video assisted thoracoscopy procedure for right empyema,     May 2001.  HISTORY OF PRESENT ILLNESS: This patient is a 70 year old white male admitted May 17, 2000, recent admission Apr 08, 2000 for right-sided empyema and VATS procedure.  He was discharged home doing well until he developed increased weakness and gait disorder x 1 week.  On evaluation MRI of the thoracic, lumbar, and sacral spine was essentially unremarkable.  Neurosurgery (Dr. Orlin Hilding) saw the patient and lumbar puncture was carried out, with protein 173, glucose 69.  Suspected was Guillain-Barre syndrome and the patient was placed on IVIG therapy.  Ongoing hyponatremia.  December 24, 1999 treated for SIADH, 1000 cc fluid restriction.  Sodium improved to 134.  Lasix was slowly decreased.  He was moderate assistance for bed mobility, total assistance for transfers.  No chest pain or shortness of breath.  Wearing bilateral foot splints 8 a.m. to 8 p.m.  Subcutaneous Lovenox for deep vein thrombosis prophylaxis.  Latest hemoglobin 10, hematocrit 28.  He was admitted for comprehensive rehabilitation program.  PAST MEDICAL HISTORY: Hypothyroidism.  PAST SURGICAL HISTORY:  1. History of dental extractions.  2. Tonsillectomy.  3. VATS procedure, Apr 08, 2000, for right empyema.  ALLERGIES:  1. TRIMETHOPRIM.  2. SULFA.  PRIMARY CARE PHYSICIAN: Rudi Heap, M.D., [place], [place].  MEDICATIONS PRIOR TO ADMISSION:  1. Aspirin q.d.  2. Synthroid.  3. Vitamin E.  4. Lipitor.  SOCIAL HISTORY: [place] wife and 82 year old daughter.  Independent prior to admission, since his VATS procedure.  Employed as a Medical illustrator.  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  A second daughter, also in [place], [place].  HOSPITAL COURSE: The patient did well on rehabilitation services with therapies initiated on a b.i.d. basis.  The following issues were followed during the patients rehabilitation course.  Pertaining to [name] Guillain-Barre syndrome, this remained stable with progressive excellent overall progress, followed by neurology services.  He was on subcutaneous Lovenox for deep vein thrombosis prophylaxis and this was discontinued after his mobility improved.  His quadriceps strength greatly improved at 4/5.  He had some decreased knee extension.  He was now independent in his room for transfers, ambulating extended distances of 50 feet with contact guard assistance.  He was modified independent for activities of daily living.  He would receive ongoing therapies.  His hyponatremia had resolved, with latest sodium level of 137.  His fluid restrictions were discontinued.  He had completed his diuretics.  He continued on his hormone supplement for hypothyroidism as prior to hospital admission.  He had no bowel or bladder disturbances.  Full family teaching was  completed.  All day passes went well. The plan was to be discharged to home.  The latest laboratories show a sodium of 137, potassium 4.0, BUN 13, creatinine 0.8.  Hemoglobin 11.2, hematocrit 33.3, WBC 6.  DISCHARGE MEDICATIONS:  1. Vioxx 25 mg q.d.  2. Multivitamin q.d.  3. Synthroid 50 mcg q.d.  4. Darvocet-N 100 as-needed for pain.  DISCHARGE ACTIVITY: As tolerated, with walker and contact guard for assistance.  DISCHARGE DIET: Regular.  SPECIAL INSTRUCTIONS: No driving.   Ongoing therapies as per rehabilitation services.  FOLLOW-UP: Follow up with Dr. Orlin Hilding of neurologg services, Dr. Dorothy Puffer to take over medical care.  Follow up with Dr. Ellwood Dense with rehabilitation services as-needed. DD:  06/19/00 TD:  06/19/00 Job: [id number]

## 2011-04-26 NOTE — Op Note (Signed)
[place]. [facility]  Patient:    Anthony Noble, Anthony Noble                        MRN: [id number] Proc. Date: 05/01/00 Adm. Date:  [id number] Disc. Date: [id number] Attending:  McDiarmid, Leighton Roach.                           Operative Report  PREOPERATIVE DIAGNOSIS:  Complex right peripneumonic effusion.  POSTOPERATIVE DIAGNOSIS:  Right empyema.  PROCEDURE:  Bronchoscopy, right VATS, decortication.  SURGEON:  Salvatore Decent. Dorris Fetch, M.D.  ASSISTANT:  Sherrie George, P.A.  ANESTHESIA:  General.  FINDINGS:  Complicated early organizing empyema with thick ____ and visceral parietal peel with multiple loculated fluid collections.  Specimen sent for culture and pathology.  Bronchoscopy was within normal limits.  CLINICAL NOTE:  The patient is a 70 year old gentleman who has had a two to three week history of bronchitis/walking pneumonia.  He was treated with antibiotics as an outpatient, but had persistent fevers.  He was also found to have an elevated white blood cell count.  A chest x-ray was obtained which showed a right peripneumonic effusion.  The patient was admitted and started on IV antibiotics, and had a chest CT obtained.  The chest CT revealed a complex peripneumonic effusion, possible empyema in the right chest.  The patient was initially seen in consultation by D. Karle Plumber, M.D. who recommended that VATS approach under general anesthesia, and felt that a chest tube would not be adequate.  The patient then was referred to me for surgical consideration.  I discussed the indications, risks, benefits, and alternative treatments in detail with the patient and his family.  He understood the risks and agreed to proceed.  DESCRIPTION OF PROCEDURE:  The patient was brought to the preoperative holding area on May 01, 2000.  Arterial blood pressure monitoring and intravenous access were obtained.  The patient was taken to the operating room, anesthetized, and  intubated with a single lumen endotracheal tube.  Flexible fiberoptic bronchoscopy was performed via the endotracheal tube.  It revealed normal endobronchial anatomy and no endobronchial lesions.  Bronchoalveolar lavage was performed of the right lower and middle lobe bronchi, and the specimen was sent for cultures for bacteria, fungus, AFB, and virus.  The patient then was reintubated with a double lumen endotracheal tube and placed in a left lateral decubitus position.  The right chest was prepped and draped in the usual fashion.  An incision was made in the right chest, in the mid axillary line at the 8th intercostal space.  It was carried through the skin and subcutaneous tissue. Hemostasis was achieved with cautery.  A single lung ventilation of the left lung was carried out and the right lung was deflated.  The chest was entered bluntly using a hemostat.  A port was inserted and the thoracoscope was inserted into the chest.  It was obvious immediately that there was a complex multiloculated effusion with thick peels on both the visceral and parietal pleura.  Fluid was drained and sent for cultures.  A second incision was made.  This was 1.5 cm long.  It was located at approximately the 5th interspace posteriorly.  Instrumentation was placed through this incision and stripping of the parietal pleural peel was initiated.  This resulted in considerable bleeding.  It was a thick peel, but it did strip off easily.  Specimens were sent for  both fungal, bacterial, viral, and AFB cultures, as well as to pathology.  An extensive parietal pleurectomy was performed.  The majority of the peel was located in the mid half of the chest wall, extending posteriorly and anteriorly.  The apex and base of the parietal pleural surface were relatively spared, as were the apex of the lung and the lower lobe basilar segments.  Attempts to strip the visceral pleural peel were unsuccessful via this  limited incision.  Therefore, the incision was lengthened to approximately 5 cm in length.  The latissimus was divided.  The serratus was mobilized anteriorly. The intercostal muscles were divided.  A laminar spreader was used to spread the ribs.  No ribs were cut or broken.  Using the thoracoscope for visualization, the additional room for instrumentation allowed stripping of the visceral peel, and this was most heavily concentrated on the superior segment of the lower lobe, as well as the right middle lobe.  The basilar segments were relatively spared posteriorly.  After stripping the pleural peel, there was some air leakage from the lung when it was inflated.  The chest was irrigated with a large amount of warm normal saline containing vancomycin.  Inspection was made for hemostasis. There was diffuse oozing from the chest wall.  A 36-French straight chest tube was placed anteriorly, and directed to the apex, and a 36-French right angle chest tube was placed posteriorly and directed into the paravertebral gutter. The chest tubes were placed to suction.  The scope was removed.  The mini-thoracotomy incision was closed with running 0 Vicryl in the serratus area, a running 0 Vicryl in the latissimus fascia, and 2-0 Vicryl subcutaneous suture, and a 3-0 Vicryl subcuticular suture.  All sponge, needle, and instrument counts were correct at the end of the procedure.  There were no intraoperative complications.  The patient was taken from the operating room to the postanesthetic care unit, extubated, and in stable condition. DD:  05/01/00 TD:  05/06/00 Job: [id number]

## 2011-04-26 NOTE — Discharge Summary (Signed)
[place]  Patient:    Anthony Noble, Anthony Noble                   MRN: [id number] Adm. Date:  [id number] Disc. Date: 05/29/00 Attending:  Tobin Chad Dictator:   Gwenlyn Perking, M.D. CC:         Monica Becton, M.D.             [employer] [phone]             Santina Evans A. Orlin Hilding, M.D.             Daniel L. Thomasena Edis, M.D.                           Discharge Summary  SERVICE:  [facility].  RESIDENTS: 1. Wyline Copas, M.D. 2. Gwenlyn Perking, M.D.  ADMITTING DIAGNOSES: 1. Lower extremity muscle weakness. 2. Hyponatremia. 3. History of hyperlipidemia. 4. History of hypothyroidism. 5. History of hematuria. 6. History of heme positive stools. 7. Recent history of a severe right-sided pneumonia with empyema status post    decortication and VATs procedure.  DISCHARGE DIAGNOSES: 1. Guillain-Barre syndrome, resolving. 2. Syndrome of inappropriate antidiuretic hormone secretions (SIADH). 3. Otherwise see above.  REFERRING PHYSICIAN:  Monica Becton, M.D. at [facility].  CONSULTANTS: 1. Catherine A. Orlin Hilding, M.D., neurology. 2. Rande Brunt. Thomasena Edis, M.D., physical rehabilitation medicine.  PROCEDURES: 1. Magnetic resonance imaging of the lumbar and thoracic spine on    May 17, 2000. 2. Lumbar puncture on May 18, 2000.  HISTORY OF PRESENT ILLNESS:  [name] is a 70 year old white male who was just recently admitted Apr 28, 2000 for a pneumonia that failed outpatient treatment.  It evolved into a right-sided empyema.  He comes in with a chief complaint to [facility] of lower extremity weakness.  At the previous hospitalization, he underwent VATs procedure, drainage of the effusion, as well as a decortication procedure.  He was subsequently discharged on Levaquin and was doing well in follow-up until  the past three days prior to presentation to the emergency room, when he has developed relatively sudden onset of lower extremity weakness.  He has had multiple falls and is located mostly in his hips.  He has had some knee pain which he describes more in the muscle surrounding his knee.  He has had no bowel or bladder incontinence.  He had been seen at [facility], Dr. Monica Becton, and laboratory work had been obtained on May 15, 2000 showing a white count of 12.4, platelets of 967,000, hemoglobin of 10, and hematocrit of 30. His sodium at that time was 133 and TSH was normal along with T4 and T3.  The patient notably has a history of hypothyroidism.  He was also discontinued on his Lipitor at that time.  He had follow-up with Dr. Viviann Spare C. Hendrickson, his Physicist, medical, who had ordered a CT of the chest and abdomen which results are pending at the time of presentation.  REVIEW OF SYSTEMS:  The patient denies a history of fevers.  He has been out playing golf up until three days ago.  He describes some possible visual changes, some heat intolerance, some slight constipation with bowel movements. No incontinence of urine, no incontinence of stool.  He has paresthesias  in his waist and upper legs which he describes as needle pricking sensation. There is no radiating pain into his feet or back.  No upper respiratory symptoms.  He is having minimal chest discomfort from his procedure.  He has a draining site from his chest tube which was taken out, which his wife had been taking care of.  His wife is R.N. occupational Geneticist, molecular.  He is having no diplopia, no dyspnea, no shortness of breath, no orthopnea.  He has had a macular rash from the tape that has also shown up on his chest without pruritus.  He has had no personality changes or slurred speech.  PAST MEDICAL HISTORY:  Again, significant for hyperlipidemia, hypothyroidism, history of hematuria,  questionable UTI.  PAST SURGICAL HISTORY:  VATs procedure.  Decortication as above. Tonsillectomy.  MEDICATIONS: 1. Lipitor 10 mg p.o. q.d. which was discontinued. 2. Synthroid 0.05 mg p.o. q.d. 3. Multivitamins. 4. Aspirin. 5. Vitamin E. 6. Zantac. 7. Pepto-Bismol p.r.n.  ALLERGIES:  SEPTRA, SULFA.  FAMILY HISTORY:  His mother died at 43 with renal failure and ovarian cancer. Paternal grandmother had cancer.  Father living at 27 with cataracts.  He had carotid endarterectomy.  SOCIAL HISTORY:  He has a 24-year history of smoking and quit in the early 80s.  He drinks one alcoholic beverage a day.  He plays gold avidly.  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  PHYSICAL EXAMINATION:  VITAL SIGNS:  Vital signs stable and afebrile.  Blood pressure 151/92, heart rate 102.  O2 saturation is 97% on room air.  GENERAL:  Well-developed, well-nourished gentleman lying on his left side because of legs ache if he lays on his back.  HEENT:  Normocephalic and atraumatic.  Pupils are equal, round and reactive to light and accommodation.  Tympanic membranes are clear bilaterally. Oropharynx is moist.  No erythema.  NECK:  Supple.  No JVD.  No lymphadenopathy.  CHEST:  Clear to auscultation bilaterally.  No wheezes, rales, or rhonchi.  He has scar from his thoracotomy.  A healing chest tube wound which is not draining or has no erythema.  CARDIOVASCULAR:  Regular rate and rhythm without murmurs, rubs, or gallops.  ABDOMEN:  Soft.  Positive bowel sounds.  There is ecchymosis from heparin injection.  EXTREMITIES:  Lower extremity shows no evidence of atrophy.  There are fasciculations noted in the calves bilaterally and in his quadriceps intermittently.  NEUROLOGICAL:  Extraocular movements are intact.  Pupils are equal, round and reactive to light and accommodation.  Funduscopic disk appear normal  bilaterally.  Tongue is midline.  Normal gag.  No facial asymmetry.   Strength in the upper extremity is biceps 5/5 bilaterally, triceps 5/5 bilaterally. Upper extremity reflexes are symmetric and appreciable.  Lower extremity reveals normal strength in his ankle and plantar flexors; however, there is some diminished strength in his ankle dorsiflexors.  He has downgoing Babinskis bilaterally.  He has ability to raise the entire leg off the bed, left is slightly stronger than the right but his proximal limb muscle shows significant weakness bilaterally.  His sensation is grossly intact.  We are only able to elicit his patella reflexes or his ankle reflexes bilaterally.  LABORATORY DATA:  On admission, sodium 115, potassium 4.1, chloride 81, bicarbonate 24, glucose 115, BUN 15, creatinine 0.7, calcium 8.6, total protein 7.8, albumin 3, AST 165, ALT 35, alkaline phosphatase 124, bilirubin 0.7.  White count 18.2, hemoglobin 10.7, hematocrit 32, MCV of 79.5, platelets 967,000  with the differential 86% neutrophils, no eosinophils.  ASSESSMENT:  A 70 year old with recent history of pulmonary empyema status post decortication and drainage who now presents with acute onset of proximal leg weakness bilaterally and leukocytosis which is worrisome with the epidural abscess or other infectious process/mass process of the spinal cord.  We will immediately get MRI of the thoracic and lumbar spine to rule out anatomic lesions.  If positive, we will consult neurosurgery.  Other possibility could be myositis secondary to medications or an immune neuropathy such as Guillain-Barre.  It appears that this is not thyroid related.  We will check a CK.  We will consider neurologic consult.  In the meantime, we will admit for further observation.  HOSPITAL COURSE:  The patient was admitted with the above assessment and plan. Neurology was consulted who came to evaluate this patient immediately.  Of note, that further labs were obtained in light of this patients hyponatremia. A  urine osmolarity was 14, serum osmolarity was 249.  In light of these findings, it was found that the patient had an inappropriate concentrated urine and a diagnosis of syndrome of inappropriate antidiuretic hormone secretion was made and treated accordingly with fluid restriction and Lasix.  Neurology came back to evaluate this patient and suggested that this would likely be a picture of Guillain-Barre syndrome with findings of a progressive neuropathy, peripheral, after a pulmonary infection and pulmonary surgery. Suggestion was made to get a lumbar puncture.  This was readily obtained.  The procedure was carried out at May 18, 2000 without complications.  The CSF studies revealed a very high protein at 173 and one white blood cell and otherwise within normal limits.  This was suggestive of Guillain-Barre syndrome.  Neurology therefore recommended a five-day course of IVIG treatment.  This was subsequently initiated.  The weakness progressed and on May 19, 2000 the patient had torso and upper extremity weakness as well.  Respiratory was consulted to follow this patient with negative inspiratory forces as well as peak flows to alert [place] to any possible ventilatory problems with this patient.  The patient was also subsequently moved to a subacute step-down bed to monitor him closely for any autonomic dysfunction or ventilatory depression.  The patient was also started on PT.  Prevent issues were addressed with compression stockings as well as daily injections of Lovenox to prevent deep venous thromboses.  In addition to fluid restriction and Lasix, the patient was also started on salt tablets which the patient tolerated well.  The patient continued to progress with muscle weakness and on May 20, 2000 he had his nadir with fairly pronounced deltoid weakness bilaterally.  However, this subsequently plateaued over the ensuing days.  On May 23, 2000, after having received the full  course of five days of IVIG, the patient continued to stabilize.  His SIADH improved on our regimen prescribed and on May 28, 2000, it was decided the patient had benefited maximally from an acute hospital stay and should be safely discharged to acute rehabilitation after a physical and rehabilitation medicine consult was obtained to assess this patient for his rehabilitation needs and status.  On May 28, 2000, the patient had Gibbs negative inspiratory force.  Bottle capacity was at 1.3 liters.  He was otherwise stable.  His SIADH had almost resolved with a sodium of 134 and 4.2.  Therefore the patient was discharged to acute rehabilitation.  CONDITION ON DISCHARGE:  Stable.  DISPOSITION:  Discharge the patient to acute rehabilitation.  DISCHARGE  MEDICATIONS: 1. Lasix 20 mg p.o. q.d. 2. Desitin 30 gm 2 t.i.d. to perineal and sacral area p.r.n. 3. Potassium chloride tablets 40 mEq p.o. q.d. 4. Docusate sodium 100 mg 1 p.o. q.d. 5. Guaifenesin 600 mg 1 p.o. q.12h. p.o. p.r.n. for cough. 6. Lovenox 40 mg subcutaneously q.d. 7. Levothyroxine 50 mcg 1 p.o. q.d. 8. Laxative of choice p.r.n.  DISCHARGE INSTRUCTIONS:  Activity as prescribed by physical and rehabilitation medicine.  Diet is regular with some fiber for irregularity.  Symptoms to warn further assessment should the patient have any signs and symptoms of ventilatory compromise, he will need to be reevaluated and assessed.  FOLLOW-UP:  The patient is to follow up after acute rehabilitation with his primary care physician, Dr. Monica Becton, at [facility].  This patient is also to follow up with neurology.  He is to call Dr. Gustavus Messing. [facility] for an appointment.  Lastly, the patient will need to follow up with Dr. Viviann Spare C. Hendricksons office to follow up his empyema. DD:  05/29/00 TD:  05/29/00 Job: [id number]

## 2011-06-06 ENCOUNTER — Other Ambulatory Visit: Payer: Self-pay | Admitting: Family Medicine

## 2011-06-06 DIAGNOSIS — R0989 Other specified symptoms and signs involving the circulatory and respiratory systems: Secondary | ICD-10-CM

## 2011-06-18 ENCOUNTER — Other Ambulatory Visit: Payer: Self-pay

## 2011-06-18 ENCOUNTER — Ambulatory Visit
Admission: RE | Admit: 2011-06-18 | Discharge: 2011-06-18 | Disposition: A | Discharge status: Home / Self Care | Payer: Medicare Other | Source: Ambulatory Visit | Attending: Family Medicine | Admitting: Family Medicine

## 2011-06-18 DIAGNOSIS — R0989 Other specified symptoms and signs involving the circulatory and respiratory systems: Secondary | ICD-10-CM

## 2011-07-19 ENCOUNTER — Encounter: Payer: Self-pay | Admitting: Vascular Surgery

## 2011-07-25 ENCOUNTER — Ambulatory Visit (INDEPENDENT_AMBULATORY_CARE_PROVIDER_SITE_OTHER): Payer: Medicare Other | Admitting: Vascular Surgery

## 2011-07-25 ENCOUNTER — Other Ambulatory Visit (INDEPENDENT_AMBULATORY_CARE_PROVIDER_SITE_OTHER): Payer: Medicare Other

## 2011-07-25 ENCOUNTER — Encounter: Payer: Self-pay | Admitting: Vascular Surgery

## 2011-07-25 VITALS — BP 144/73 | HR 64 | Ht 69.0 in | Wt 172.0 lb

## 2011-07-25 DIAGNOSIS — I6529 Occlusion and stenosis of unspecified carotid artery: Secondary | ICD-10-CM

## 2011-07-25 NOTE — Progress Notes (Signed)
Subjective:     Patient ID: Anthony Noble, male   DOB: [birth year], 70 y.o.   MRN: [id number]  HPI Patient is a 70 year old male referred by Dr. Rudi Heap for evaluation of carotid stenosis. He denies symptoms of TIA amaurosis or stroke. His carotid stenosis was picked up on physical exam by left-sided carotid bruit. Recent carotid duplex scan that showed progression of  his stenosis.  Carotid duplex dated 06/18/2011 from [facility] was reviewed which shows his stenosis was 50-70% on the left internal carotid artery and less than 50% on the right internal carotid artery. He also had bilateral external carotid artery stenosis. He had antegrade vertebral flow. He takes aspirin every other day.   Other chronic medical problems include elevated cholesterol, hyperlipidemia, and bladder cancer. All these problems are currently controlled and followed by Dr. Christell Constant.  Past Medical History  Diagnosis Date  . GERD (gastroesophageal reflux disease)   . Carotid stenosis   . Varicose veins   . Hyperlipidemia   . Arthritis   . Diverticulosis   . Leg cramps   . Guillain-Barre syndrome   . Colon polyps   . Cancer 2004    Bladder , had chemo  . Guillain-Barre syndrome   . Closed T12 spinal fracture     Family History  Problem Relation Age of Onset  . Other Mother     varicose veins  . COPD Father     History  Substance Use Topics  . Smoking status: Former Smoker -- 20 years    Types: Cigarettes    Quit date: 12/09/1981  . Smokeless tobacco: Not on file  . Alcohol Use: No      Review of Systems  HENT: Positive for hearing loss.   Eyes: Positive for visual disturbance.       Objective:   Physical Exam Blood pressure 144/73, pulse 64, height 5\' 9"  (1.753 m), weight 172 lb (78.019 kg), SpO2 99.00%.  HEENT: Negative  Neck: 2+ carotid pulses no bruit appreciated today  Cardiac: Regular rate and rhythm without murmur  Abdomen: Soft nontender nondistended no  mass  Musculoskeletal: No major joint deformities  Neuro: Symmetric upper extremity and lower extremity motor strength which is 5 over 5  Skin: No open ulcer or rash    Carotid duplex exam: Repeated the patient's carotid duplex exam in our lab today. This showed less than 40% carotid stenosis bilaterally with external carotid artery stenosis bilaterally. He had antegrade vertebral flow.     Assessment:     External carotid artery stenosis bilaterally with mild carotid plaquing. He had no significant stenosis of the internal carotid artery on our exam today. He is asymptomatic the    Plan:     #1 continue aspirin every other day  #2 followup carotid duplex exam in 6 months time if he has less than 40% stenosis at that time we can probably space at his follow up appointments to every 2 years. If he has a moderate carotid stenosis we will consider followup every 6 months  3 patient was informed of signs symptoms of TIA or stroke in if he has any symptoms prior to his scheduled duplex exam he'll return for followup sooner.

## 2011-07-30 ENCOUNTER — Ambulatory Visit (HOSPITAL_BASED_OUTPATIENT_CLINIC_OR_DEPARTMENT_OTHER)
Admission: RE | Admit: 2011-07-30 | Discharge: 2011-07-30 | Disposition: A | Discharge status: Home / Self Care | Payer: Medicare Other | Source: Ambulatory Visit | Attending: Urology | Admitting: Urology

## 2011-07-30 ENCOUNTER — Other Ambulatory Visit: Payer: Self-pay | Admitting: Urology

## 2011-07-30 DIAGNOSIS — N138 Other obstructive and reflux uropathy: Secondary | ICD-10-CM | POA: Insufficient documentation

## 2011-07-30 DIAGNOSIS — Z79899 Other long term (current) drug therapy: Secondary | ICD-10-CM | POA: Insufficient documentation

## 2011-07-30 DIAGNOSIS — C679 Malignant neoplasm of bladder, unspecified: Secondary | ICD-10-CM | POA: Insufficient documentation

## 2011-07-30 DIAGNOSIS — Z01812 Encounter for preprocedural laboratory examination: Secondary | ICD-10-CM | POA: Insufficient documentation

## 2011-07-30 DIAGNOSIS — Z01810 Encounter for preprocedural cardiovascular examination: Secondary | ICD-10-CM | POA: Insufficient documentation

## 2011-07-30 DIAGNOSIS — N401 Enlarged prostate with lower urinary tract symptoms: Secondary | ICD-10-CM | POA: Insufficient documentation

## 2011-07-30 DIAGNOSIS — E039 Hypothyroidism, unspecified: Secondary | ICD-10-CM | POA: Insufficient documentation

## 2011-07-30 DIAGNOSIS — E7800 Pure hypercholesterolemia, unspecified: Secondary | ICD-10-CM | POA: Insufficient documentation

## 2011-07-30 DIAGNOSIS — Z7982 Long term (current) use of aspirin: Secondary | ICD-10-CM | POA: Insufficient documentation

## 2011-07-30 DIAGNOSIS — Z8551 Personal history of malignant neoplasm of bladder: Secondary | ICD-10-CM | POA: Insufficient documentation

## 2011-07-30 HISTORY — PX: OTHER SURGICAL HISTORY: SHX169

## 2011-07-30 LAB — POCT HEMOGLOBIN-HEMACUE: Hemoglobin: 14.1 g/dL (ref 13.0–17.0)

## 2011-08-02 NOTE — Op Note (Signed)
NAMEOLUWATOMISIN, DEMAN NO.:  [id number]  MEDICAL RECORD NO.:  [id number]  LOCATION:                                 FACILITY:  PHYSICIAN:  Excell Seltzer. Annabell Howells, M.D.    DATE OF BIRTH:  [birth year]  DATE OF PROCEDURE: DATE OF DISCHARGE:                              OPERATIVE REPORT   PROCEDURE:  Cystoscopy; bilateral retrograde pyelograms with interpretation; and transurethral resection of bladder tumor, 2-5 cm.  PREOPERATIVE DIAGNOSIS:  Bladder lesion, rule out tumor.  POSTOPERATIVE DIAGNOSIS:  Bladder tumors of the left lateral wall and bladder neck and right trigone.  SURGEON:  Dr. Bjorn Pippin.  ANESTHESIA:  General.  SPECIMEN:  Biopsies from the left bladder neck, left lateral wall, and right trigone.  DRAINS:  20-French Foley catheter.  COMPLICATIONS:  None.  INDICATIONS:  Mr. Cloe is a 70 year old white male with a history of bladder tumors, who underwent surveillance cystoscopy recently, who was found to have red patch on the left wall of the bladder worrisome for carcinoma in situ.  It was felt that cysto, bladder biopsy, and bilateral retrograde pyelograms were indicated.  FINDINGS OF PROCEDURE:  He was taken to operating room where he was given Cipro.  General anesthetic was induced.  He was fitted with PAS hose and placed in lithotomy positions.  His perineum and genitalia were prepped with Betadine solution.  He was draped in usual sterile fashion. Time-out was performed.  Cystoscopy was performed using a 22-French scope and 12 degrees lens. Examination revealed a normal urethra.  The external sphincter was intact.  The prostatic urethra showed bilobar hyperplasia with minimal obstruction.  Examination of bladder revealed the ureteral orifices in the normal anatomic position.  Just lateral to the right ureteral orifice was a small area of tiny papillary lesions, worrisome for tumor. On the left of bladder neck, there was a patch of fronds  consistent with transitional cell carcinoma and on the left lateral wall, there was some patchy fronds as well as some small erythematous areas that are worrisome for neoplasm.  After a thorough cystoscopic inspection the to 12 and 70 degrees lenses, the bilateral retrograde pyelograms were performed with a 5-French open- end catheter and Omnipaque.  The right retrograde pyelogram revealed a normal internal collecting system and ureter.  Left retrograde pyelogram revealed a normal left renal collecting system and ureter.  After completion of the retrograde pyelography, a cup biopsy forceps with a 12 degrees lens was passed through the scope and two biopsies were obtained from left bladder neck; one biopsy from the left lateral wall and then a separate biopsy from the right trigone.  I then passed the 28-French resectoscope sheath and fulgurated the remaining tumors which were in an area approximately 3 x 4 cm in size, so this entire area was fulgurated and then the area lateral to the right trigone was fulgurated with care being taken to avoid the ureteral orifice.  Once all lesions had been fulgurated, further inspection revealed no residual lesions.  The resectoscope was removed and a 20-French Foley catheter was inserted.  The balloon was filled with 10 mL sterile fluid. The catheter was placed  for straight drainage.  The patient was taken down from lithotomy position.  His anesthetic was reversed.  He was moved to recovery room in stable condition.  There were no complications.     Excell Seltzer. Annabell Howells, M.D.     JJW/MEDQ  D:  07/30/2011  T:  07/30/2011  Job:  [id number]  cc:   Ernestina Penna, M.D. Fax: [phone]  Electronically Signed by Bjorn Pippin M.D. on 08/02/2011 07:37:10 AM

## 2011-08-09 NOTE — Procedures (Unsigned)
CAROTID DUPLEX EXAM  INDICATION:  Follow up known carotid stenosis.  HISTORY: Diabetes:  No. Cardiac:  No. Hypertension:  No. Smoking:  Quit in 1983. Previous Surgery:  No. CV History: Amaurosis Fugax No, Paresthesias No, Hemiparesis No.                                      RIGHT             LEFT Brachial systolic pressure:         112               112 Brachial Doppler waveforms:         WNL               WNL Vertebral direction of flow:        Antegrade         Antegrade DUPLEX VELOCITIES (cm/sec) CCA peak systolic                   104               102 ECA peak systolic                   323               303 ICA peak systolic                   106               91 ICA end diastolic                   34                23 PLAQUE MORPHOLOGY:                  Heterogenous      Heterogenous PLAQUE AMOUNT:                      Moderate          Moderate PLAQUE LOCATION:                    ECA/ICA           CCA/ECA/ICA  IMPRESSION: 1. 1% to 39% bilateral internal carotid artery plaquing. 2. Significant stenosis of the bilateral external carotid artery     stenoses. 3. Bilateral vertebral arteries are within normal limits.  ___________________________________________ Janetta Hora Fields, MD  LT/MEDQ  D:  07/25/2011  T:  07/25/2011  Job:  [id number]

## 2012-01-13 ENCOUNTER — Encounter: Payer: Self-pay | Admitting: Internal Medicine

## 2012-01-13 ENCOUNTER — Ambulatory Visit: Payer: Medicare Other | Admitting: Internal Medicine

## 2012-01-13 ENCOUNTER — Ambulatory Visit (INDEPENDENT_AMBULATORY_CARE_PROVIDER_SITE_OTHER): Payer: Medicare Other | Admitting: Internal Medicine

## 2012-01-13 VITALS — BP 158/81 | HR 85 | Temp 97.9°F | Ht 69.0 in | Wt 180.0 lb

## 2012-01-13 DIAGNOSIS — N451 Epididymitis: Secondary | ICD-10-CM | POA: Insufficient documentation

## 2012-01-13 DIAGNOSIS — N453 Epididymo-orchitis: Secondary | ICD-10-CM

## 2012-01-13 MED ORDER — ISONIAZID 300 MG PO TABS: 300.0000 mg | ORAL_TABLET | Freq: Every day | ORAL | Status: DC

## 2012-01-13 MED ORDER — PYRIDOXINE HCL 50 MG PO TABS: 50.0000 mg | ORAL_TABLET | Freq: Every day | ORAL | Status: DC

## 2012-01-13 NOTE — Progress Notes (Signed)
  Subjective:    Patient ID: Anthony Noble, male    DOB: Apr 29, 1941, 71 y.o.   MRN: [id number]  HPI this is a new patient sent by Dr. Annabell Howells with a concern of granulomatous epididymitis.  He has a remote history of smoking and was noted to have bladder cancer in mid 2012 and underwent chemo and then BCG therapy 10/2011.  He then had symptoms of prostatitis after a prostate exam and took a course of cipro but then had improvement of symptoms on Flomax.  Then he developed pain in his right testicle and noted to have a mass in the right epididymis, concerning for the epididymitis.  Additionally, he has noted having drenching night sweats that occurred for about a week not long after treatment, though it had resolved until it happened again last night.  He does not feel a fever is associated with it and denies any recent significant weight loss.      Review of Systems  Constitutional: Negative for fever, chills, fatigue and unexpected weight change.       +night sweats  Genitourinary: Positive for difficulty urinating. Negative for hematuria, discharge and testicular pain.  Musculoskeletal: Negative for myalgias and arthralgias.  Skin: Negative for pallor and rash.  Hematological: Negative for adenopathy.       Objective:   Physical Exam  Constitutional: He appears well-developed and well-nourished. No distress.  Cardiovascular: Normal rate, regular rhythm and normal heart sounds.   Genitourinary: Penis normal.  Skin: Skin is warm and dry. No rash noted. No erythema.  Psychiatric: He has a normal mood and affect. His behavior is normal.          Assessment & Plan:

## 2012-01-13 NOTE — Assessment & Plan Note (Signed)
It is unclear if he has an active infection at this time related to the BCG.  He does have an enlarged epididymis and did have pain, though that has resolved.  I am also concerned about the night sweats and a more disseminated course.  Though night sweats can be transient after therapy, it returned last night.  At this point, I think it would be best to start with INH and if he continues to have night sweats at his follow up in 2 weeks, I would consider adding rifampin and extending his course.  He is very concerned about side effects and I discussed at length the possible side effects and what to watch out for.  I also will check baseline LFTs today and he will receive B6.

## 2012-01-13 NOTE — Patient Instructions (Signed)
Start the medicine and follow up in 2 weeks

## 2012-01-15 ENCOUNTER — Ambulatory Visit: Payer: Medicare Other | Admitting: Internal Medicine

## 2012-01-23 ENCOUNTER — Other Ambulatory Visit (INDEPENDENT_AMBULATORY_CARE_PROVIDER_SITE_OTHER): Payer: Medicare Other | Admitting: Vascular Surgery

## 2012-01-23 DIAGNOSIS — I6529 Occlusion and stenosis of unspecified carotid artery: Secondary | ICD-10-CM

## 2012-01-28 ENCOUNTER — Ambulatory Visit (INDEPENDENT_AMBULATORY_CARE_PROVIDER_SITE_OTHER): Payer: Medicare Other | Admitting: Internal Medicine

## 2012-01-28 ENCOUNTER — Encounter: Payer: Self-pay | Admitting: Internal Medicine

## 2012-01-28 VITALS — BP 170/76 | HR 90 | Temp 98.7°F | Wt 180.0 lb

## 2012-01-28 DIAGNOSIS — N451 Epididymitis: Secondary | ICD-10-CM

## 2012-01-28 DIAGNOSIS — N453 Epididymo-orchitis: Secondary | ICD-10-CM

## 2012-01-28 MED ORDER — PYRIDOXINE HCL 50 MG PO TABS: 50.0000 mg | ORAL_TABLET | Freq: Every day | ORAL | Status: DC

## 2012-01-28 MED ORDER — ISONIAZID 300 MG PO TABS: 300.0000 mg | ORAL_TABLET | Freq: Every day | ORAL | Status: DC

## 2012-01-29 ENCOUNTER — Encounter: Payer: Self-pay | Admitting: Internal Medicine

## 2012-01-29 NOTE — Assessment & Plan Note (Signed)
At this time, symptoms concerning for M. Bovis infection have resolved.  I have advised him to finish two more weeks of INH therapy and then stop.  He is to return if he has any further episodes of night sweats or testicular pain, swelling.     He can return PRN.

## 2012-01-29 NOTE — Progress Notes (Signed)
  Subjective:    Patient ID: Anthony Noble, male    DOB: 12-06-1941, 71 y.o.   MRN: [id number]  HPI He comes in for follow up of possible BCG;/bovis infection following therapy for his bladder cancer.  Concern of infection was raised with the possible epididymitis and also with persistent night sweats.  He had initial night sweats following therapy that had resolved but returned x 1 prior to his first appointment with me.  He also had some signs of epididymitis that resolved as well.  He was started on single therapy with INH for the above concerns and returns today for follow up.  He has had no further episodes of night sweats nor new concerns.  He does still complain of symtpoms of prostatitis, which predates the therapy and several other complaints that also predate the therapy, but nothing related to the therapy.      Review of Systems  Constitutional: Negative for fever, chills, fatigue and unexpected weight change.  Respiratory: Negative for cough, shortness of breath and wheezing.   Cardiovascular: Negative for chest pain, palpitations and leg swelling.  Genitourinary: Positive for difficulty urinating. Negative for urgency, frequency, penile swelling, scrotal swelling and testicular pain.  Skin: Negative for pallor and rash.  Neurological: Negative for headaches.  Hematological: Negative for adenopathy.  Psychiatric/Behavioral: Negative for dysphoric mood. The patient is not nervous/anxious.        Objective:   Physical Exam  Constitutional: He appears well-developed and well-nourished. No distress.  Cardiovascular: Normal rate, regular rhythm and normal heart sounds.  Exam reveals no gallop and no friction rub.   No murmur heard. Pulmonary/Chest: Effort normal and breath sounds normal. No respiratory distress. He has no wheezes.          Assessment & Plan:

## 2012-01-31 ENCOUNTER — Telehealth: Payer: Self-pay | Admitting: *Deleted

## 2012-01-31 ENCOUNTER — Encounter: Payer: Self-pay | Admitting: Vascular Surgery

## 2012-01-31 NOTE — Telephone Encounter (Signed)
Patient's wife called c/o he has fever of 102 and back pain.  Spoke with Dr. Luciana Axe and he thinks he may have a UTI, advised to call PCP or urologist. Patient agreed to call. Wendall Mola CMA

## 2012-01-31 NOTE — Procedures (Unsigned)
CAROTID DUPLEX EXAM  INDICATION:  Carotid stenosis.  HISTORY: Diabetes:  No Cardiac:  No Hypertension:  No Smoking:  Previous Previous Surgery:  No CV History:  Currently asymptomatic. Amaurosis Fugax No, Paresthesias No, Hemiparesis No                                      RIGHT             LEFT Brachial systolic pressure:         122               128 Brachial Doppler waveforms:         WNL               WNL Vertebral direction of flow:        antegrade         antegrade DUPLEX VELOCITIES (cm/sec) CCA peak systolic                   112               107 ECA peak systolic                   193               413 ICA peak systolic                   109               82 ICA end diastolic                   26                23 PLAQUE MORPHOLOGY:                  heterogeneous     heterogeneous PLAQUE AMOUNT:                      mild              mild PLAQUE LOCATION:                    CCA/ICA/ECA       CCA/ICA/ECA  IMPRESSION: 1. Bilateral internal carotid artery stenosis present in the 1% to 39%     range. 2. Bilateral external carotid artery stenosis present. 3. Bilateral vertebral arteries are patent and antegrade. 4. Essentially unchanged since previous study on 07/25/2011.  ___________________________________________ Janetta Hora. Fields, MD  SH/MEDQ  D:  01/23/2012  T:  01/23/2012  Job:  [id number]

## 2012-02-05 ENCOUNTER — Telehealth: Payer: Self-pay | Admitting: *Deleted

## 2012-02-05 NOTE — Telephone Encounter (Signed)
Dr. Christell Constant wants Dr. Luciana Axe to call him . His cell is [phone] & back door office number is [phone]. I offered him our md's pager, as Dr. Luciana Axe will not be here until 2pm. He said it could wait until he gets in

## 2012-02-06 ENCOUNTER — Encounter: Payer: Self-pay | Admitting: Internal Medicine

## 2012-02-06 ENCOUNTER — Ambulatory Visit (INDEPENDENT_AMBULATORY_CARE_PROVIDER_SITE_OTHER): Payer: Medicare Other | Admitting: Internal Medicine

## 2012-02-06 VITALS — BP 143/70 | HR 72 | Temp 97.4°F | Ht 69.0 in | Wt 183.0 lb

## 2012-02-06 DIAGNOSIS — A491 Streptococcal infection, unspecified site: Secondary | ICD-10-CM

## 2012-02-06 DIAGNOSIS — T50A95A Adverse effect of other bacterial vaccines, initial encounter: Secondary | ICD-10-CM

## 2012-02-06 MED ORDER — ISONIAZID 300 MG PO TABS: 300.0000 mg | ORAL_TABLET | Freq: Every day | ORAL | Status: DC

## 2012-02-06 MED ORDER — ISONIAZID 300 MG PO TABS: 300.0000 mg | ORAL_TABLET | Freq: Every day | ORAL | Status: AC

## 2012-02-06 MED ORDER — PYRIDOXINE HCL 50 MG PO TABS: 50.0000 mg | ORAL_TABLET | Freq: Every day | ORAL | Status: AC

## 2012-02-06 MED ORDER — PYRIDOXINE HCL 50 MG PO TABS: 50.0000 mg | ORAL_TABLET | Freq: Every day | ORAL | Status: DC

## 2012-02-06 MED ORDER — RIFAMPIN 300 MG PO CAPS: 600.0000 mg | ORAL_CAPSULE | Freq: Every day | ORAL | Status: DC

## 2012-02-06 MED ORDER — RIFAMPIN 300 MG PO CAPS: 600.0000 mg | ORAL_CAPSULE | Freq: Every day | ORAL | Status: AC

## 2012-02-06 NOTE — Assessment & Plan Note (Addendum)
These symptoms are concerning for disseminated BCG/bovis infection and I have upped his therapy to Rifampin + INH.  I cautioned him that it can take weeks for the fever and night sweats to dissapate in some cases.  I also cautioned on the signs of SIRS like symtpoms, though this is rare.  Other possible manifestations can be hepatitis, osteomyelitis, pneumonitis, arthritis, but he has no localizing signs at this time.    He will return here in 2 weeks to monitor symptoms and recheck his LFTs and he was advised to contact myself or Dr. Christell Constant if he has any problems prior to that.

## 2012-02-06 NOTE — Progress Notes (Signed)
  Subjective:    Patient ID: Anthony Noble, male    DOB: March 27, 1941, 71 y.o.   MRN: [id number]  HPI here for follow up after discussing with his PCP.  He was last seen 1 week ago and doing well with no night sweats, fevers or other new symptoms however starting Friday had a fever to 104 and night sweats described as soaking the sheets at least 2 nights.  No SOB, no new pain anywhere, though has had some myalgias attributed to exercising.  He did have one episode of night sweats previously outside the window of post treatment but had resolved.  He has been on INH for possible granulomatous epididymitis but is not having any signficant symptoms with this.      Review of Systems  Constitutional: Positive for fever and chills. Negative for appetite change and unexpected weight change.       Night sweats  Respiratory: Negative for cough, chest tightness, shortness of breath and wheezing.   Cardiovascular: Negative for chest pain, palpitations and leg swelling.  Gastrointestinal: Negative for vomiting, abdominal pain and diarrhea.  Musculoskeletal: Positive for myalgias. Negative for joint swelling and arthralgias.  Skin: Positive for rash.  Neurological: Negative for headaches.  Hematological: Negative for adenopathy.       Objective:   Physical Exam  Constitutional: He appears well-developed and well-nourished. No distress.  HENT:  Mouth/Throat: No oropharyngeal exudate.  Cardiovascular: Normal rate, regular rhythm and normal heart sounds.  Exam reveals no gallop and no friction rub.   No murmur heard. Pulmonary/Chest: Effort normal and breath sounds normal. No respiratory distress. He has no wheezes. He has no rales.  Abdominal: Soft. Bowel sounds are normal. He exhibits no distension. There is no tenderness. There is no rebound.  Lymphadenopathy:    He has no cervical adenopathy.  Skin: Skin is warm. Rash noted.       + maculopapilary rash on right arm  Psychiatric: He has a normal  mood and affect. His behavior is normal.          Assessment & Plan:

## 2012-02-12 ENCOUNTER — Other Ambulatory Visit: Payer: Self-pay | Admitting: *Deleted

## 2012-02-12 DIAGNOSIS — I6529 Occlusion and stenosis of unspecified carotid artery: Secondary | ICD-10-CM

## 2012-02-20 ENCOUNTER — Ambulatory Visit (INDEPENDENT_AMBULATORY_CARE_PROVIDER_SITE_OTHER): Payer: Medicare Other | Admitting: Internal Medicine

## 2012-02-20 VITALS — BP 147/76 | HR 64 | Temp 97.4°F | Wt 173.2 lb

## 2012-02-20 DIAGNOSIS — A491 Streptococcal infection, unspecified site: Secondary | ICD-10-CM

## 2012-02-20 DIAGNOSIS — D649 Anemia, unspecified: Secondary | ICD-10-CM

## 2012-02-21 NOTE — Assessment & Plan Note (Addendum)
He is doing better and I encouraged continued compliance.  I also urged avoiding alcohol due to the LFTs.  I do not think any of the other complaints are associated with the medicines.  I will have him back in one month and plan on 3 months of therapy if it continues to go well.  If he is still symptomatic in a month, I would consider a 6 month course.

## 2012-02-21 NOTE — Progress Notes (Signed)
  Subjective:    Patient ID: Anthony Noble, male    DOB: 02/08/1941, 71 y.o.   MRN: [id number]  HPI Here for follow up on his presumed BCG infection.  He has multiple complaints but his systemic symptoms have actually improved.  He is having less fever and night sweats.  He does complain about fatigue, less activity, rash, anemia but overall tolerating the therapy well.  LFTs from Dr.  Christell Constant noted and ok, no significant elevation.     Review of Systems  Constitutional: Positive for activity change and fatigue. Negative for fever, chills and unexpected weight change.  Respiratory: Negative for cough, shortness of breath and wheezing.   Cardiovascular: Negative for chest pain, palpitations and leg swelling.  Gastrointestinal: Negative for abdominal pain and abdominal distention.  Skin: Positive for rash.  Hematological: Negative for adenopathy.  Psychiatric/Behavioral: Negative for dysphoric mood. The patient is not nervous/anxious.        Objective:   Physical Exam  Constitutional: He appears well-developed and well-nourished. No distress.  Cardiovascular: Normal rate, regular rhythm and normal heart sounds.  Exam reveals no gallop and no friction rub.   No murmur heard. Pulmonary/Chest: Breath sounds normal. No respiratory distress. He has no wheezes. He has no rales.          Assessment & Plan:

## 2012-03-24 ENCOUNTER — Ambulatory Visit (INDEPENDENT_AMBULATORY_CARE_PROVIDER_SITE_OTHER): Payer: Medicare Other | Admitting: Internal Medicine

## 2012-03-24 ENCOUNTER — Encounter: Payer: Self-pay | Admitting: Internal Medicine

## 2012-03-24 VITALS — BP 153/66 | HR 73 | Temp 97.8°F | Ht 69.0 in | Wt 175.0 lb

## 2012-03-24 DIAGNOSIS — T50A95A Adverse effect of other bacterial vaccines, initial encounter: Secondary | ICD-10-CM

## 2012-03-24 DIAGNOSIS — R21 Rash and other nonspecific skin eruption: Secondary | ICD-10-CM

## 2012-03-24 NOTE — Assessment & Plan Note (Signed)
He continues to do well on his current therapy. He is having no further signs of disseminated infection. I have discussed with him that he should continue to complete a three-month course which will end on May 28. If he continues to be asymptomatic, I have told him that he can then stop his therapy and if desired, continue with the BCG treatments if urology deems appropriate. Additionally, I told him that he can followup p.r.n. But if he does have a reoccurrence of any symptoms that he should call and return to see me to consider prolonging the therapy beyond 3 months.

## 2012-03-24 NOTE — Progress Notes (Signed)
  Subjective:    Patient ID: Anthony Noble, male    DOB: [birth year], 71 y.o.   MRN: [id number]  HPI He comes in today for followup of his BCG infection. He continues on his regimen of INH and rifampin. He reports no further episodes of night sweats, no lymphadenopathy, no significant rashes and his energy level is improving. He is able to continue with his activities almost as much as prior to this illness. He has had no weight loss and today has no significant complaints.   Review of Systems  Constitutional: Negative for fever, chills, diaphoresis, activity change, appetite change, fatigue and unexpected weight change.  Respiratory: Negative for cough.   Cardiovascular: Negative for leg swelling.  Skin: Positive for rash.       Not c/w a drug rash  Hematological: Negative for adenopathy.       Objective:   Physical Exam  Constitutional: He appears well-developed and well-nourished. No distress.  Cardiovascular: Normal rate, regular rhythm and normal heart sounds.  Exam reveals no gallop and no friction rub.   No murmur heard. Lymphadenopathy:    He has no cervical adenopathy.  Skin:       Several pinpoint erythematous lesions on stomach          Assessment & Plan:

## 2013-01-22 ENCOUNTER — Ambulatory Visit: Payer: Medicare Other | Admitting: Neurosurgery

## 2013-01-22 ENCOUNTER — Other Ambulatory Visit: Payer: Medicare Other

## 2013-01-27 ENCOUNTER — Encounter: Payer: Self-pay | Admitting: Neurosurgery

## 2013-01-28 ENCOUNTER — Other Ambulatory Visit (INDEPENDENT_AMBULATORY_CARE_PROVIDER_SITE_OTHER): Payer: 59 | Admitting: *Deleted

## 2013-01-28 ENCOUNTER — Other Ambulatory Visit: Payer: Medicare Other

## 2013-01-28 ENCOUNTER — Encounter: Payer: Self-pay | Admitting: Neurosurgery

## 2013-01-28 ENCOUNTER — Ambulatory Visit: Payer: Medicare Other | Admitting: Neurosurgery

## 2013-01-28 ENCOUNTER — Ambulatory Visit (INDEPENDENT_AMBULATORY_CARE_PROVIDER_SITE_OTHER): Payer: 59 | Admitting: Neurosurgery

## 2013-01-28 VITALS — BP 139/79 | HR 68 | Resp 16 | Ht 69.0 in | Wt 176.0 lb

## 2013-01-28 DIAGNOSIS — I6529 Occlusion and stenosis of unspecified carotid artery: Secondary | ICD-10-CM

## 2013-01-28 NOTE — Progress Notes (Signed)
[facility] Carotid Office Note  CC: Carotid surveillance Referring Physician: Fields  History of Present Illness: 72 year old male patient of Dr. Darrick Penna with known mild bilateral carotid stenosis. The patient denies any signs or symptoms of CVA, TIA, amaurosis fugax. The patient denies any new medical diagnoses or recent surgery.  Past Medical History  Diagnosis Date  . GERD (gastroesophageal reflux disease)   . Carotid stenosis   . Varicose veins   . Hyperlipidemia   . Arthritis   . Diverticulosis   . Leg cramps   . Guillain-Barre syndrome   . Colon polyps   . Cancer 2004    Bladder , had chemo  . Guillain-Barre syndrome   . Closed T12 spinal fracture     ROS: [x]  Positive   [ ]  Denies    General: [ ]  Weight loss, [ ]  Fever, [ ]  chills Neurologic: [ ]  Dizziness, [ ]  Blackouts, [ ]  Seizure [ ]  Stroke, [ ]  "Mini stroke", [ ]  Slurred speech, [ ]  Temporary blindness; [ ]  weakness in arms or legs, [ ]  Hoarseness Cardiac: [ ]  Chest pain/pressure, [ ]  Shortness of breath at rest [ ]  Shortness of breath with exertion, [ ]  Atrial fibrillation or irregular heartbeat Vascular: [ ]  Pain in legs with walking, [ ]  Pain in legs at rest, [ ]  Pain in legs at night,  [ ]  Non-healing ulcer, [ ]  Blood clot in vein/DVT,   Pulmonary: [ ]  Home oxygen, [ ]  Productive cough, [ ]  Coughing up blood, [ ]  Asthma,  [ ]  Wheezing Musculoskeletal:  [ ]  Arthritis, [ ]  Low back pain, [ ]  Joint pain Hematologic: [ ]  Easy Bruising, [ ]  Anemia; [ ]  Hepatitis Gastrointestinal: [ ]  Blood in stool, [ ]  Gastroesophageal Reflux/heartburn, [ ]  Trouble swallowing Urinary: [ ]  chronic Kidney disease, [ ]  on HD - [ ]  MWF or [ ]  TTHS, [ ]  Burning with urination, [ ]  Difficulty urinating Skin: [ ]  Rashes, [ ]  Wounds Psychological: [ ]  Anxiety, [ ]  Depression   Social History History  Substance Use Topics  . Smoking status: Former Smoker -- 20 years    Types: Cigarettes    Quit date:  12/09/1981  . Smokeless tobacco: Never Used  . Alcohol Use: No     Comment: occasional wine    Family History Family History  Problem Relation Age of Onset  . Other Mother     varicose veins  . Cancer Mother   . COPD Father   . Cancer Maternal Grandmother     Allergies  Allergen Reactions  . Septra (Bactrim)     Muscle weakness, numbness  . Sulfa Antibiotics     Current Outpatient Prescriptions  Medication Sig Dispense Refill  . aspirin 81 MG EC tablet Take 81 mg by mouth daily.        Marland Kitchen atorvastatin (LIPITOR) 10 MG tablet Take 20 mg by mouth daily.       . Calcium Carbonate (CALCIUM 500 PO) Take by mouth.        . Cholecalciferol (VITAMIN D) 2000 UNITS tablet Take 2,000 Units by mouth daily.        . fish oil-omega-3 fatty acids 1000 MG capsule Take 2 g by mouth daily.        Marland Kitchen isoniazid (NYDRAZID) 300 MG tablet Take 1 tablet (300 mg total) by mouth daily.  30 tablet  3  . levothyroxine (SYNTHROID, LEVOTHROID) 100 MCG tablet Take 100 mcg by mouth  daily.        . Multiple Vitamin (MULTIVITAMIN PO) Take by mouth daily.        Marland Kitchen triamcinolone cream (KENALOG) 0.1 % continuous as needed.      . pyridOXINE (B-6) 50 MG tablet Take 1 tablet (50 mg total) by mouth daily.  30 tablet  3  . rifampin (RIFADIN) 300 MG capsule Take 300 mg by mouth 2 (two) times daily.       No current facility-administered medications for this visit.    Physical Examination  Filed Vitals:   01/28/13 1358  BP: 139/79  Pulse: 68  Resp: 16    Body mass index is 25.98 kg/(m^2).  General:  WDWN in NAD Gait: Normal HEENT: WNL Eyes: Pupils equal Pulmonary: normal non-labored breathing , without Rales, rhonchi,  wheezing Cardiac: RRR, without  Murmurs, rubs or gallops; Abdomen: soft, NT, no masses Skin: no rashes, ulcers noted  Vascular Exam Pulses: 2+ radial pulses bilaterally Carotid bruits: Carotid pulses to auscultation no bruits are heard Extremities without ischemic changes, no Gangrene  , no cellulitis; no open wounds;  Musculoskeletal: no muscle wasting or atrophy   Neurologic: A&O X 3; Appropriate Affect ; SENSATION: normal; MOTOR FUNCTION:  moving all extremities equally. Speech is fluent/normal  Non-Invasive Vascular Imaging CAROTID DUPLEX 01/28/2013  Right ICA 20 - 39 % stenosis Left ICA 20 - 39 % stenosis   ASSESSMENT/PLAN: Asymptomatic patient will followup in one year with repeat carotid duplex. The carotid duplex today is unchanged from previous exam. The patient's questions were encouraged and answered, he is in agreement with this plan.  Lauree Chandler ANP   Clinic MD: Darrick Penna

## 2013-01-29 NOTE — Addendum Note (Signed)
Addended by: Sharee Pimple on: 01/29/2013 08:33 AM   Modules accepted: Orders

## 2013-03-24 ENCOUNTER — Other Ambulatory Visit (INDEPENDENT_AMBULATORY_CARE_PROVIDER_SITE_OTHER): Payer: 59

## 2013-03-24 DIAGNOSIS — E785 Hyperlipidemia, unspecified: Secondary | ICD-10-CM

## 2013-03-24 DIAGNOSIS — E559 Vitamin D deficiency, unspecified: Secondary | ICD-10-CM

## 2013-03-24 DIAGNOSIS — Z125 Encounter for screening for malignant neoplasm of prostate: Secondary | ICD-10-CM

## 2013-03-24 DIAGNOSIS — Z0000 Encounter for general adult medical examination without abnormal findings: Secondary | ICD-10-CM

## 2013-03-24 LAB — POCT CBC
MCH, POC: 29.4 pg (ref 27–31.2)
MCHC: 33.5 g/dL (ref 31.8–35.4)
MCV: 87.5 fL (ref 80–97)
POC LYMPH PERCENT: 21.2 %L (ref 10–50)
Platelet Count, POC: 241 10*3/uL (ref 142–424)
RDW, POC: 14 %
WBC: 6.1 10*3/uL (ref 4.6–10.2)

## 2013-03-24 LAB — LIPID PANEL
HDL: 64 mg/dL (ref >39)
LDL Cholesterol: 158 mg/dL — ABNORMAL HIGH (ref 0–99)
Total CHOL/HDL Ratio: 3.7 Ratio

## 2013-03-24 LAB — COMPREHENSIVE METABOLIC PANEL
AST: 24 U/L (ref 0–37)
Alkaline Phosphatase: 50 U/L (ref 39–117)
BUN: 15 mg/dL (ref 6–23)
Calcium: 9.5 mg/dL (ref 8.4–10.5)
Creat: 1.08 mg/dL (ref 0.50–1.35)
Total Bilirubin: 0.6 mg/dL (ref 0.3–1.2)

## 2013-03-24 LAB — THYROID PANEL WITH TSH: TSH: 4.36 u[IU]/mL (ref 0.350–4.500)

## 2013-03-24 LAB — PSA: PSA: 1.16 ng/mL (ref <=4.00)

## 2013-03-24 NOTE — Progress Notes (Signed)
Pt came in for labs for upcoming appt

## 2013-03-30 ENCOUNTER — Ambulatory Visit (INDEPENDENT_AMBULATORY_CARE_PROVIDER_SITE_OTHER): Payer: 59 | Admitting: Family Medicine

## 2013-03-30 ENCOUNTER — Encounter: Payer: Self-pay | Admitting: Family Medicine

## 2013-03-30 ENCOUNTER — Ambulatory Visit (INDEPENDENT_AMBULATORY_CARE_PROVIDER_SITE_OTHER): Payer: 59

## 2013-03-30 VITALS — BP 122/65 | HR 69 | Temp 97.1°F | Ht 67.5 in | Wt 176.2 lb

## 2013-03-30 DIAGNOSIS — E785 Hyperlipidemia, unspecified: Secondary | ICD-10-CM

## 2013-03-30 DIAGNOSIS — Z87891 Personal history of nicotine dependence: Secondary | ICD-10-CM

## 2013-03-30 DIAGNOSIS — E039 Hypothyroidism, unspecified: Secondary | ICD-10-CM | POA: Insufficient documentation

## 2013-03-30 DIAGNOSIS — Z8551 Personal history of malignant neoplasm of bladder: Secondary | ICD-10-CM

## 2013-03-30 DIAGNOSIS — Z0000 Encounter for general adult medical examination without abnormal findings: Secondary | ICD-10-CM

## 2013-03-30 DIAGNOSIS — N529 Male erectile dysfunction, unspecified: Secondary | ICD-10-CM

## 2013-03-30 LAB — POCT URINALYSIS DIPSTICK
Glucose, UA: NEGATIVE
Nitrite, UA: NEGATIVE
Urobilinogen, UA: NEGATIVE

## 2013-03-30 LAB — POCT UA - MICROSCOPIC ONLY
Crystals, Ur, HPF, POC: NEGATIVE
Yeast, UA: NEGATIVE

## 2013-03-30 NOTE — Patient Instructions (Addendum)
FOBT given Continue current meds and therapeutic lifestyle changes Cialis samples 5 mg given for patient to try for erectile dysfunction, one daily as needed Consider trying a prescription drug for hyperlipidemia versus red yeast rice,- consider trying a statin drug instead

## 2013-03-30 NOTE — Progress Notes (Signed)
Subjective:    Patient ID: Anthony Noble, male    DOB: March 27, 1941, 72 y.o.   MRN: [id number]  HPI Patient comes in today for complete physical exam and health maintenance checkup. He has a history of hyperlipidemia, hypothyroidism, and bladder cancer. See review of systems.   Review of Systems  Constitutional: Negative.   HENT: Negative.   Eyes: Negative.   Respiratory: Negative.   Cardiovascular: Negative.   Gastrointestinal: Negative.   Endocrine: Negative.   Genitourinary: Negative.   Musculoskeletal: Negative.   Skin: Positive for rash (dry skin, top of feet, back of ankles).  Allergic/Immunologic: Negative.   Neurological: Negative.   Psychiatric/Behavioral: Negative.    Last eye exam January 2014.,. in addition he notices some tinnitus at times and  some mild ED. He also recently has some carotid Dopplers. Recent lab work was reviewed with patient.    Objective:   Physical Exam BP 122/65  Pulse 69  Temp(Src) 97.1 F (36.2 C) (Oral)  Ht 5' 7.5" (1.715 m)  Wt 176 lb 3.2 oz (79.924 kg)  BMI 27.17 kg/m2  The patient appeared well nourished and normally developed, alert and oriented to time and place. Speech, behavior and judgement appear normal. Vital signs as documented.  Head exam is unremarkable. No scleral icterus or pallor noted. Ears and mouth were normal. Neck is without jugular venous distension or thyromegaly. He does have carotid bruits left greater than right. Carotid upstrokes are brisk bilaterally. No cervical adenopathy. Lungs are clear anteriorly and posteriorly to auscultation. Normal respiratory effort. Cardiac exam reveals regular rate and rhythm@ 72/min. First and second heart sounds normal. No murmurs, rubs or gallops.  Abdominal exam reveals normal bowl sounds, no masses, no organomegaly and no aortic enlargement. No inguinal adenopathy. Rectal exam was normal. There were no inguinal hernias. Prostate was slightly enlarged right greater than left.  No nodule. Extremities are nonedematous and both femoral and pedal pulses are normal. He did have significant varicose veins in both lower extremity Skin without pallor or jaundice.  Warm and dry, without rash. Neurologic exam reveals normal deep tendon reflexes and normal sensation. Results for orders placed in visit on 03/24/13  COMPREHENSIVE METABOLIC PANEL      Result Value Range   Sodium 136  135 - 145 mEq/L   Potassium 4.0  3.5 - 5.3 mEq/L   Chloride 101  96 - 112 mEq/L   CO2 25  19 - 32 mEq/L   Glucose, Bld 94  70 - 99 mg/dL   BUN 15  6 - 23 mg/dL   Creat 7.82  9.56 - 2.13 mg/dL   Total Bilirubin 0.6  0.3 - 1.2 mg/dL   Alkaline Phosphatase 50  39 - 117 U/L   AST 24  0 - 37 U/L   ALT 13  0 - 53 U/L   Total Protein 6.9  6.0 - 8.3 g/dL   Albumin 4.5  3.5 - 5.2 g/dL   Calcium 9.5  8.4 - 08.6 mg/dL  LIPID PANEL      Result Value Range   Cholesterol 238 (*) 0 - 200 mg/dL   Triglycerides 79  <578 mg/dL   HDL 64  >46 mg/dL   Total CHOL/HDL Ratio 3.7     VLDL 16  0 - 40 mg/dL   LDL Cholesterol 962 (*) 0 - 99 mg/dL  PSA      Result Value Range   PSA 1.16  <=4.00 ng/mL  THYROID PANEL WITH TSH  Result Value Range   T4, Total 8.7  5.0 - 12.5 ug/dL   T3 Uptake 16.1  09.6 - 37.0 %   Free Thyroxine Index 2.4  1.0 - 3.9   TSH 4.360  0.350 - 4.500 uIU/mL  VITAMIN D 25 HYDROXY      Result Value Range   Vit D, 25-Hydroxy 42  30 - 89 ng/mL  POCT CBC      Result Value Range   WBC 6.1  4.6 - 10.2 K/uL   Lymph, poc 1.3  0.6 - 3.4   POC LYMPH PERCENT 21.2  10 - 50 %L   MID (cbc)    0 - 0.9   POC MID %    0 - 12 %M   POC Granulocyte 4.5  2 - 6.9   Granulocyte percent 73.7  37 - 80 %G   RBC 4.8  4.69 - 6.13 M/uL   Hemoglobin 14.2  14.1 - 18.1 g/dL   HCT, POC 04.5 (*) 40.9 - 53.7 %   MCV 87.5  80 - 97 fL   MCH, POC 29.4  27 - 31.2 pg   MCHC 33.5  31.8 - 35.4 g/dL   RDW, POC 81.1     Platelet Count, POC 241.0  142 - 424 K/uL   MPV 6.8  0 - 99.8 fL     [facility] reading  (PRIMARY) by  Dr. Christell Constant: No acute abnormality                                EKG: normal EKG, normal sinus rhythm, unchanged from previous tracings.        Assessment & Plan:  1. Encounter for Medicare annual wellness exam - DG Chest 2 View; Future - EKG 12-Lead  2. Hyperlipidemia  3. Hypothyroidism  4. History of bladder cancer - POCT urinalysis dipstick - POCT UA - Microscopic Only - Urine culture  5. Erectile dysfunction Samples of Cialis given for patient to try

## 2013-03-31 LAB — URINE CULTURE
Colony Count: NO GROWTH
Organism ID, Bacteria: NO GROWTH

## 2013-04-02 ENCOUNTER — Telehealth: Payer: Self-pay | Admitting: *Deleted

## 2013-04-02 NOTE — Telephone Encounter (Signed)
Pt notified of result Copy sent to Dr Annabell Howells

## 2013-04-02 NOTE — Telephone Encounter (Signed)
Message copied by Bearl Mulberry on Fri Apr 02, 2013 12:29 PM ------      Message from: Ernestina Penna      Created: Tue Mar 30, 2013  9:52 PM       Urinalysis within normal limits except 4+ protein. Send copy of this report to Dr. Annabell Howells.      Repeat urinalysis in 2 week ------

## 2014-01-04 ENCOUNTER — Telehealth: Payer: Self-pay | Admitting: Family Medicine

## 2014-01-04 NOTE — Telephone Encounter (Signed)
Nothing in epic at this time-jhb

## 2014-01-28 ENCOUNTER — Ambulatory Visit: Payer: 59 | Admitting: Family

## 2014-01-28 ENCOUNTER — Encounter: Payer: Self-pay | Admitting: Family

## 2014-01-28 ENCOUNTER — Other Ambulatory Visit (HOSPITAL_COMMUNITY): Payer: 59

## 2014-01-31 ENCOUNTER — Other Ambulatory Visit (HOSPITAL_COMMUNITY): Payer: 59

## 2014-01-31 ENCOUNTER — Ambulatory Visit: Payer: 59 | Admitting: Family

## 2014-02-01 ENCOUNTER — Ambulatory Visit (HOSPITAL_COMMUNITY)
Admission: RE | Admit: 2014-02-01 | Discharge: 2014-02-01 | Disposition: A | Discharge status: Home / Self Care | Payer: 59 | Source: Ambulatory Visit | Attending: Family | Admitting: Family

## 2014-02-01 ENCOUNTER — Ambulatory Visit (INDEPENDENT_AMBULATORY_CARE_PROVIDER_SITE_OTHER): Payer: 59 | Admitting: Family

## 2014-02-01 ENCOUNTER — Encounter: Payer: Self-pay | Admitting: Family

## 2014-02-01 VITALS — BP 128/80 | HR 61 | Resp 16 | Ht 69.0 in | Wt 178.0 lb

## 2014-02-01 DIAGNOSIS — I658 Occlusion and stenosis of other precerebral arteries: Secondary | ICD-10-CM | POA: Diagnosis not present

## 2014-02-01 DIAGNOSIS — I6529 Occlusion and stenosis of unspecified carotid artery: Secondary | ICD-10-CM | POA: Diagnosis present

## 2014-02-01 NOTE — Patient Instructions (Signed)

## 2014-02-01 NOTE — Progress Notes (Signed)
Established Carotid Patient   History of Present Illness  Anthony Noble is a 73 y.o. male patient of Dr. Oneida Alar with known mild bilateral internal carotid artery stenosis. He returns today for routine surveillance.  Patient has not had previous carotid artery intervention. Works out 5 days/week for an hour, and golfs 3 days /week. He denies claudication symptoms.  Patient has Negative history of TIA or stroke symptom.  The patient denies amaurosis fugax or monocular blindness.  The patient  denies facial drooping.  Pt. denies hemiplegia.  The patient denies receptive or expressive aphasia.  Pt. denies extremity weakness.  Patient denies New Medical or Surgical History:    Pt Diabetic: No Pt smoker: former smoker, quit in 1983  Pt meds include: Statin : No: takes Red Yeast Rice instead ASA: Yes, 81 mg every other day, bruises easily Other anticoagulants/antiplatelets: no   Past Medical History  Diagnosis Date  . GERD (gastroesophageal reflux disease)   . Carotid stenosis   . Varicose veins   . Hyperlipidemia   . Arthritis   . Diverticulosis   . Leg cramps   . Colon polyps   . Cancer 2004    Bladder , had chemo  . Closed T12 spinal fracture   . Guillain-Barre syndrome   . Guillain-Barre syndrome     Social History History  Substance Use Topics  . Smoking status: Former Smoker -- 20 years    Types: Cigarettes    Quit date: 12/09/1981  . Smokeless tobacco: Never Used  . Alcohol Use: No     Comment: occasional wine    Family History Family History  Problem Relation Age of Onset  . Other Mother     varicose veins  . Cancer Mother   . COPD Father   . Cancer Maternal Grandmother     Surgical History Past Surgical History  Procedure Laterality Date  . Tonsillectomy    . Thoracotomy  2001    Right side for empyema  . Fracture surgery  2009    ORIF    Allergies  Allergen Reactions  . Bcg Live Other (See Comments)  . Septra [Bactrim]     Muscle  weakness, numbness  . Sulfa Antibiotics     Current Outpatient Prescriptions  Medication Sig Dispense Refill  . aspirin 81 MG EC tablet Take 81 mg by mouth daily.        . Calcium Carbonate-Vitamin D (CALCIUM 500/VITAMIN D PO) Take 1 tablet by mouth. 800 IU      . Cholecalciferol (VITAMIN D) 2000 UNITS tablet Take 2,000 Units by mouth daily.        . Coenzyme Q10 (COQ10) 100 MG CAPS Take 1 tablet by mouth daily.      . fish oil-omega-3 fatty acids 1000 MG capsule Take 2 g by mouth daily.        Marland Kitchen levothyroxine (SYNTHROID, LEVOTHROID) 100 MCG tablet Take 100 mcg by mouth daily. 1/2 tablet qd      . magnesium oxide (MAG-OX) 400 MG tablet Take 400 mg by mouth daily.      . Multiple Vitamin (MULTIVITAMIN PO) Take by mouth daily.        . Red Yeast Rice 600 MG CAPS Take 1 capsule by mouth daily.      . Saw Palmetto 450 MG CAPS Take 1 capsule by mouth.       No current facility-administered medications for this visit.    Review of Systems : See HPI for pertinent positives  and negatives.  Physical Examination  Filed Vitals:   02/01/14 1512  BP: 128/80  Pulse: 61  Resp: 16   Filed Weights   02/01/14 1512  Weight: 178 lb (80.74 kg)   Body mass index is 26.27 kg/(m^2).  General: WDWN male in NAD GAIT: normal Eyes: PERRLA Pulmonary:  Non-labored, CTAB, Negative  Rales, Negative rhonchi, & Negative wheezing.  Cardiac: regular Rhythm ,  No detected murmur.  VASCULAR EXAM Carotid Bruits Left Right   Negative Negative     Radial pulses are 3+ palpable and equal.                                                                                                                            LE Pulses LEFT RIGHT       POPLITEAL  not palpable   not palpable       POSTERIOR TIBIAL   palpable    palpable        DORSALIS PEDIS      ANTERIOR TIBIAL  palpable   palpable     Gastrointestinal: soft, nontender, BS WNL, no r/g,  negative masses.  Musculoskeletal: Negative muscle  atrophy/wasting. M/S 5/5 throughout, Extremities without ischemic changes.  Neurologic: A&O X 3; Appropriate Affect ; SENSATION ;normal;  Speech is normal CN 2-12 intact, Pain and light touch intact in extremities, Motor exam as listed above.   Non-Invasive Vascular Imaging CAROTID DUPLEX 02/01/2014   CEREBROVASCULAR DUPLEX EVALUATION    INDICATION: Follow-up carotid artery disease     PREVIOUS INTERVENTION(S):     DUPLEX EXAM:     RIGHT  LEFT  Peak Systolic Velocities (cm/s) End Diastolic Velocities (cm/s) Plaque LOCATION Peak Systolic Velocities (cm/s) End Diastolic Velocities (cm/s) Plaque  83 12  CCA PROXIMAL 112 25   76 17  CCA MID 104 24   83 19  CCA DISTAL 86 24   301 18 HT ECA 255 28 HT  116 28 HT ICA PROXIMAL 81 27 HT  106 30  ICA MID 87 28   105 33  ICA DISTAL 83 24     1.4 ICA / CCA Ratio (PSV) .94  Antegrade  Vertebral Flow Antegrade   [id number] Brachial Systolic Pressure (mmHg) [id number]  Within normal limits  Brachial Artery Waveforms Within normal limits     Plaque Morphology:  HM = Homogeneous, HT = Heterogeneous, CP = Calcific Plaque, SP = Smooth Plaque, IP = Irregular Plaque     ADDITIONAL FINDINGS:     IMPRESSION: 1. Evidence of < 40% stenosis of the bilateral internal carotid artery. 2. Stenosis is present in the bilateral external carotid artery. 3. Bilateral vertebral artery is antegrade.    Compared to the previous exam:  No significant change compared to prior exam.      Assessment: Anthony Noble is a 73 y.o. male who presents with asymptomatic minimal bilateral internal carotid artery stenosis. The  ICA stenosis is  Unchanged from previous exam.  Plan: Follow-up in 1 year with Carotid Duplex scan.   I discussed in depth with the patient the nature of atherosclerosis, and emphasized the importance of maximal medical management including strict control of blood pressure, blood glucose, and lipid levels, obtaining regular exercise, and continued  cessation of smoking.  The patient is aware that without maximal medical management the underlying atherosclerotic disease process will progress, limiting the benefit of any interventions. The patient was given information about stroke prevention and what symptoms should prompt the patient to seek immediate medical care. Thank you for allowing [place] to participate in this patient's care.  Clemon Chambers, RN, MSN, FNP-C [facility] Office: [phone]  Clinic Physician: Early  02/01/2014 2:46 PM

## 2014-02-02 NOTE — Addendum Note (Signed)
Addended by: Dorthula Rue L on: 02/02/2014 12:16 PM   Modules accepted: Orders

## 2014-03-28 ENCOUNTER — Other Ambulatory Visit: Payer: 59

## 2014-03-28 DIAGNOSIS — Z1212 Encounter for screening for malignant neoplasm of rectum: Secondary | ICD-10-CM

## 2014-03-28 NOTE — Progress Notes (Signed)
Pt came in for labs only 

## 2014-03-29 LAB — FECAL OCCULT BLOOD, IMMUNOCHEMICAL: FECAL OCCULT BLOOD: POSITIVE — AB

## 2014-03-31 ENCOUNTER — Other Ambulatory Visit (INDEPENDENT_AMBULATORY_CARE_PROVIDER_SITE_OTHER): Payer: 59

## 2014-03-31 DIAGNOSIS — E039 Hypothyroidism, unspecified: Secondary | ICD-10-CM

## 2014-03-31 DIAGNOSIS — Z0000 Encounter for general adult medical examination without abnormal findings: Secondary | ICD-10-CM

## 2014-03-31 DIAGNOSIS — Z1212 Encounter for screening for malignant neoplasm of rectum: Secondary | ICD-10-CM

## 2014-03-31 LAB — POCT CBC
Granulocyte percent: 75.9 %G (ref 37–80)
HCT, POC: 41.3 % — AB (ref 43.5–53.7)
HEMOGLOBIN: 13.3 g/dL — AB (ref 14.1–18.1)
LYMPH, POC: 1.4 (ref 0.6–3.4)
MCH: 28.3 pg (ref 27–31.2)
MCHC: 32.2 g/dL (ref 31.8–35.4)
MCV: 88.1 fL (ref 80–97)
MPV: 7.2 fL (ref 0–99.8)
PLATELET COUNT, POC: 219 10*3/uL (ref 142–424)
POC GRANULOCYTE: 4.9 (ref 2–6.9)
POC LYMPH %: 21 % (ref 10–50)
RBC: 4.7 M/uL (ref 4.69–6.13)
RDW, POC: 14.2 %
WBC: 6.5 10*3/uL (ref 4.6–10.2)

## 2014-03-31 NOTE — Progress Notes (Signed)
Pt came in for labs only 

## 2014-04-01 ENCOUNTER — Other Ambulatory Visit: Payer: 59

## 2014-04-01 DIAGNOSIS — Z1212 Encounter for screening for malignant neoplasm of rectum: Secondary | ICD-10-CM

## 2014-04-01 LAB — THYROID PANEL WITH TSH
FREE THYROXINE INDEX: 2.2 (ref 1.2–4.9)
T3 Uptake Ratio: 26 % (ref 24–39)
T4, Total: 8.6 ug/dL (ref 4.5–12.0)
TSH: 6.84 u[IU]/mL — AB (ref 0.450–4.500)

## 2014-04-01 LAB — BMP8+EGFR
BUN/Creatinine Ratio: 15 (ref 10–22)
BUN: 16 mg/dL (ref 8–27)
CALCIUM: 9.9 mg/dL (ref 8.6–10.2)
CHLORIDE: 99 mmol/L (ref 97–108)
CO2: 25 mmol/L (ref 18–29)
Creatinine, Ser: 1.09 mg/dL (ref 0.76–1.27)
GFR calc Af Amer: 79 mL/min/{1.73_m2} (ref >59)
GFR, EST NON AFRICAN AMERICAN: 68 mL/min/{1.73_m2} (ref >59)
GLUCOSE: 89 mg/dL (ref 65–99)
Potassium: 5.2 mmol/L (ref 3.5–5.2)
SODIUM: 140 mmol/L (ref 134–144)

## 2014-04-01 LAB — LIPID PANEL
CHOLESTEROL TOTAL: 235 mg/dL — AB (ref 100–199)
Chol/HDL Ratio: 3.5 ratio units (ref 0.0–5.0)
HDL: 68 mg/dL (ref >39)
LDL Calculated: 150 mg/dL — ABNORMAL HIGH (ref 0–99)
TRIGLYCERIDES: 84 mg/dL (ref 0–149)
VLDL CHOLESTEROL CAL: 17 mg/dL (ref 5–40)

## 2014-04-01 LAB — HEPATIC FUNCTION PANEL
ALT: 16 IU/L (ref 0–44)
AST: 23 IU/L (ref 0–40)
Albumin: 4.3 g/dL (ref 3.5–4.8)
Alkaline Phosphatase: 55 IU/L (ref 39–117)
BILIRUBIN DIRECT: 0.11 mg/dL (ref 0.00–0.40)
TOTAL PROTEIN: 6.4 g/dL (ref 6.0–8.5)
Total Bilirubin: 0.4 mg/dL (ref 0.0–1.2)

## 2014-04-01 LAB — VITAMIN D 25 HYDROXY (VIT D DEFICIENCY, FRACTURES): Vit D, 25-Hydroxy: 34.5 ng/mL (ref 30.0–100.0)

## 2014-04-01 NOTE — Progress Notes (Signed)
Pt came in for labs only 

## 2014-04-03 LAB — FECAL OCCULT BLOOD, IMMUNOCHEMICAL: FECAL OCCULT BLD: NEGATIVE

## 2014-04-04 ENCOUNTER — Encounter: Payer: Self-pay | Admitting: *Deleted

## 2014-04-06 ENCOUNTER — Ambulatory Visit (INDEPENDENT_AMBULATORY_CARE_PROVIDER_SITE_OTHER): Payer: 59 | Admitting: Family Medicine

## 2014-04-06 ENCOUNTER — Encounter: Payer: Self-pay | Admitting: Family Medicine

## 2014-04-06 VITALS — BP 156/75 | HR 63 | Temp 97.5°F | Ht 69.0 in | Wt 179.0 lb

## 2014-04-06 DIAGNOSIS — Z0000 Encounter for general adult medical examination without abnormal findings: Secondary | ICD-10-CM

## 2014-04-06 DIAGNOSIS — E785 Hyperlipidemia, unspecified: Secondary | ICD-10-CM

## 2014-04-06 DIAGNOSIS — Z860100 Personal history of colon polyps, unspecified: Secondary | ICD-10-CM | POA: Insufficient documentation

## 2014-04-06 DIAGNOSIS — Z8669 Personal history of other diseases of the nervous system and sense organs: Secondary | ICD-10-CM

## 2014-04-06 DIAGNOSIS — E039 Hypothyroidism, unspecified: Secondary | ICD-10-CM

## 2014-04-06 DIAGNOSIS — Z8551 Personal history of malignant neoplasm of bladder: Secondary | ICD-10-CM

## 2014-04-06 DIAGNOSIS — N529 Male erectile dysfunction, unspecified: Secondary | ICD-10-CM | POA: Insufficient documentation

## 2014-04-06 DIAGNOSIS — N400 Enlarged prostate without lower urinary tract symptoms: Secondary | ICD-10-CM | POA: Insufficient documentation

## 2014-04-06 DIAGNOSIS — C679 Malignant neoplasm of bladder, unspecified: Secondary | ICD-10-CM | POA: Insufficient documentation

## 2014-04-06 MED ORDER — SILDENAFIL CITRATE 20 MG PO TABS: ORAL_TABLET | ORAL | Status: DC

## 2014-04-06 MED ORDER — ATORVASTATIN CALCIUM 20 MG PO TABS: 20.0000 mg | ORAL_TABLET | Freq: Every day | ORAL | Status: DC

## 2014-04-06 NOTE — Addendum Note (Signed)
Addended by: Zannie Cove on: 04/06/2014 03:46 PM   Modules accepted: Orders

## 2014-04-06 NOTE — Progress Notes (Signed)
Subjective:    Patient ID: Anthony Noble, male    DOB: 09-Dec-1941, 73 y.o.   MRN: [id number]  HPI Patient is here today for annual wellness exam and follow up of chronic medical problems. The patient has no particular complaints. He Z. to have a prostate and PSA today and we will go over the remainder of his lab work. He refuses the flu shot and a Prevnar vaccine. He is up-to-date on his other help maintenance parameters. After talking with the patient he has agreed to take the Prevnar vaccine and he will wait one year and take the Pneumovax.       Patient Active Problem List   Diagnosis Date Noted  . Occlusion and stenosis of carotid artery without mention of cerebral infarction 02/01/2014  . Hypothyroidism 03/30/2013  . BCG vaccine causing adverse effect in therapeutic use 02/06/2012  . Epididymitis 01/13/2012  . Carotid stenosis 07/25/2011   Outpatient Encounter Prescriptions as of 04/06/2014  Medication Sig  . aspirin 81 MG EC tablet Take 81 mg by mouth daily.    . Calcium Carbonate-Vitamin D (CALCIUM 500/VITAMIN D PO) Take 1 tablet by mouth. 800 IU  . Cholecalciferol (VITAMIN D) 2000 UNITS tablet Take 2,000 Units by mouth daily.    . Coenzyme Q10 (COQ10) 100 MG CAPS Take 1 tablet by mouth daily.  . fish oil-omega-3 fatty acids 1000 MG capsule Take 2 g by mouth daily.    Marland Kitchen levothyroxine (SYNTHROID, LEVOTHROID) 100 MCG tablet Take 100 mcg by mouth daily. 1/2 tablet qd  . magnesium oxide (MAG-OX) 400 MG tablet Take 400 mg by mouth daily.  . Multiple Vitamin (MULTIVITAMIN PO) Take by mouth daily.    . Red Yeast Rice 600 MG CAPS Take 1 capsule by mouth daily.  . [DISCONTINUED] Saw Palmetto 450 MG CAPS Take 1 capsule by mouth.    Review of Systems  Constitutional: Negative.   HENT: Negative.   Eyes: Negative.   Respiratory: Negative.   Cardiovascular: Negative.   Gastrointestinal: Negative.   Endocrine: Negative.   Genitourinary: Negative.   Musculoskeletal: Negative.     Skin: Negative.   Allergic/Immunologic: Negative.   Neurological: Negative.   Hematological: Negative.   Psychiatric/Behavioral: Negative.        Objective:   Physical Exam  Nursing note and vitals reviewed. Constitutional: He is oriented to person, place, and time. He appears well-developed and well-nourished. No distress.  HENT:  Head: Normocephalic and atraumatic.  Right Ear: External ear normal.  Left Ear: External ear normal.  Nose: Nose normal.  Mouth/Throat: Oropharynx is clear and moist. No oropharyngeal exudate.  Eyes: Conjunctivae and EOM are normal. Pupils are equal, round, and reactive to light. Right eye exhibits no discharge. Left eye exhibits no discharge. No scleral icterus.  Neck: Normal range of motion. Neck supple. No thyromegaly present.  Cardiovascular: Normal rate, regular rhythm, normal heart sounds and intact distal pulses.  Exam reveals no gallop and no friction rub.   No murmur heard. At 60 per minute  Pulmonary/Chest: Effort normal and breath sounds normal. No respiratory distress. He has no wheezes. He has no rales. He exhibits no tenderness.  Abdominal: Soft. Bowel sounds are normal. He exhibits no mass. There is no tenderness. There is no rebound and no guarding.  Genitourinary: Rectum normal and penis normal.  Minimal prostate enlargement  Musculoskeletal: Normal range of motion. He exhibits no edema and no tenderness.  Lymphadenopathy:    He has no cervical adenopathy.  Neurological: He  is alert and oriented to person, place, and time. He has normal reflexes. No cranial nerve deficit.  Skin: Skin is warm and dry. No rash noted. No erythema. No pallor.  Psychiatric: He has a normal mood and affect. His behavior is normal. Judgment and thought content normal.   BP 156/75  Pulse 63  Temp(Src) 97.5 F (36.4 C) (Oral)  Ht 5\' 9"  (1.753 m)  Wt 179 lb (81.194 kg)  BMI 26.42 kg/m2        Assessment & Plan:  1. Hypothyroidism  2. Annual  physical exam - PSA, total and free  3. BPH (benign prostatic hyperplasia) - PSA, total and free  4. History of bladder cancer  5. History of Guillain-Barre syndrome  6. Personal history of colonic polyp  7. Hyperlipidemia - atorvastatin (LIPITOR) 20 MG tablet; Take 1 tablet (20 mg total) by mouth daily.  Dispense: 90 tablet; Refill: 3  8. Erectile dysfunction -Prescription for generic Viagra 20 mg  Meds ordered this encounter  Medications  . atorvastatin (LIPITOR) 20 MG tablet    Sig: Take 1 tablet (20 mg total) by mouth daily.    Dispense:  90 tablet    Refill:  3   Patient Instructions                       Medicare Annual Wellness Visit  [facility] and the medical providers at [facility] strive to bring you the best medical care.  In doing so we not only want to address your current medical conditions and concerns but also to detect new conditions early and prevent illness, disease and health-related problems.    Medicare offers a yearly Wellness Visit which allows our clinical staff to assess your need for preventative services including immunizations, lifestyle education, counseling to decrease risk of preventable diseases and screening for fall risk and other medical concerns.    This visit is provided free of charge (no copay) for all Medicare recipients. The clinical pharmacists at [facility] have begun to conduct these Wellness Visits which will also include a thorough review of all your medications.    As you primary medical provider recommend that you make an appointment for your Annual Wellness Visit if you have not done so already this year.  You may set up this appointment before you leave today or you may call back ([phone]) and schedule an appointment.  Please make sure when you call that you mention that you are scheduling your Annual Wellness Visit with the clinical pharmacist so that the appointment may be made  for the proper length of time.       Continue current medications. Continue good therapeutic lifestyle changes which include good diet and exercise. Fall precautions discussed with patient. If an FOBT was given today- please return it to our front desk. If you are over 57 years old - you may need Prevnar 24 or the adult Pneumonia vaccine.  Repeat return the FOBT in about one week At the same time we will repeat the CBC A prescription has been called in for Lipitor #90  ,20 mg with refills He should have her liver function tests checked in about 4 weeks after starting the Lipitor Continue with aggressive diet habits and exercise Leave off the red yeast rice The generic Viagra prescription will be given to you today to refill. Keep followup visits with urology Increase vitamin D3 to 2000 daily   Arrie Senate  MD   

## 2014-04-06 NOTE — Patient Instructions (Addendum)
Medicare Annual Wellness Visit  [facility] and the medical providers at [facility] strive to bring you the best medical care.  In doing so we not only want to address your current medical conditions and concerns but also to detect new conditions early and prevent illness, disease and health-related problems.    Medicare offers a yearly Wellness Visit which allows our clinical staff to assess your need for preventative services including immunizations, lifestyle education, counseling to decrease risk of preventable diseases and screening for fall risk and other medical concerns.    This visit is provided free of charge (no copay) for all Medicare recipients. The clinical pharmacists at [facility] have begun to conduct these Wellness Visits which will also include a thorough review of all your medications.    As you primary medical provider recommend that you make an appointment for your Annual Wellness Visit if you have not done so already this year.  You may set up this appointment before you leave today or you may call back ([phone]) and schedule an appointment.  Please make sure when you call that you mention that you are scheduling your Annual Wellness Visit with the clinical pharmacist so that the appointment may be made for the proper length of time.       Continue current medications. Continue good therapeutic lifestyle changes which include good diet and exercise. Fall precautions discussed with patient. If an FOBT was given today- please return it to our front desk. If you are over 67 years old - you may need Prevnar 52 or the adult Pneumonia vaccine.  Repeat return the FOBT in about one week At the same time we will repeat the CBC A prescription has been called in for Lipitor #90  ,20 mg with refills He should have her liver function tests checked in about 4 weeks after starting the Lipitor Continue with aggressive  diet habits and exercise Leave off the red yeast rice The generic Viagra prescription will be given to you today to refill. Keep followup visits with urology Increase vitamin D3 to 2000 daily

## 2014-04-07 LAB — PSA, TOTAL AND FREE
PSA FREE PCT: 30 %
PSA, Free: 0.18 ng/mL
PSA: 0.6 ng/mL (ref 0.0–4.0)

## 2014-04-20 ENCOUNTER — Other Ambulatory Visit: Payer: 59

## 2014-04-20 DIAGNOSIS — Z1212 Encounter for screening for malignant neoplasm of rectum: Secondary | ICD-10-CM

## 2014-04-20 NOTE — Progress Notes (Signed)
Pt came in for labs only 

## 2014-04-22 LAB — FECAL OCCULT BLOOD, IMMUNOCHEMICAL: FECAL OCCULT BLD: NEGATIVE

## 2014-04-24 ENCOUNTER — Other Ambulatory Visit: Payer: Self-pay | Admitting: Family Medicine

## 2014-04-27 ENCOUNTER — Telehealth: Payer: Self-pay | Admitting: Family Medicine

## 2014-04-27 NOTE — Telephone Encounter (Signed)
PAtient awrae

## 2014-06-14 ENCOUNTER — Telehealth: Payer: Self-pay | Admitting: Family Medicine

## 2014-06-15 NOTE — Telephone Encounter (Signed)
Please discuss this with me

## 2014-06-15 NOTE — Telephone Encounter (Signed)
Patient says Dr.moore knows about this

## 2014-06-20 ENCOUNTER — Telehealth: Payer: Self-pay | Admitting: Family Medicine

## 2014-06-20 NOTE — Telephone Encounter (Signed)
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 Not a medical matter. Will be handled outside of EMR.

## 2014-10-06 ENCOUNTER — Telehealth: Payer: Self-pay | Admitting: Family Medicine

## 2014-10-06 NOTE — Telephone Encounter (Signed)
Pt is seen yearly- appt postponed- aware

## 2014-10-11 ENCOUNTER — Ambulatory Visit: Payer: 59 | Admitting: Family Medicine

## 2014-10-20 ENCOUNTER — Ambulatory Visit: Payer: 59 | Admitting: Family Medicine

## 2014-12-15 ENCOUNTER — Telehealth: Payer: Self-pay | Admitting: Family Medicine

## 2014-12-15 ENCOUNTER — Other Ambulatory Visit (INDEPENDENT_AMBULATORY_CARE_PROVIDER_SITE_OTHER): Payer: Medicare Other

## 2014-12-15 DIAGNOSIS — R350 Frequency of micturition: Secondary | ICD-10-CM

## 2014-12-15 DIAGNOSIS — M545 Low back pain: Secondary | ICD-10-CM

## 2014-12-15 LAB — POCT URINALYSIS DIPSTICK
BILIRUBIN UA: NEGATIVE
GLUCOSE UA: NEGATIVE
Ketones, UA: NEGATIVE
Nitrite, UA: NEGATIVE
PROTEIN UA: NEGATIVE
Spec Grav, UA: >=1.03
Urobilinogen, UA: NEGATIVE
pH, UA: 6

## 2014-12-15 LAB — POCT UA - MICROSCOPIC ONLY
Casts, Ur, LPF, POC: NEGATIVE
Mucus, UA: NEGATIVE
Yeast, UA: NEGATIVE

## 2014-12-15 NOTE — Addendum Note (Signed)
Addended by: Earlene Plater on: 12/15/2014 12:37 PM   Modules accepted: Orders

## 2014-12-15 NOTE — Progress Notes (Signed)
Lab only 

## 2014-12-15 NOTE — Telephone Encounter (Signed)
Patient will leave a urine specimen and we will call with results.

## 2014-12-18 LAB — URINE CULTURE

## 2014-12-19 ENCOUNTER — Other Ambulatory Visit: Payer: Self-pay | Admitting: *Deleted

## 2014-12-19 DIAGNOSIS — N3001 Acute cystitis with hematuria: Secondary | ICD-10-CM

## 2014-12-19 MED ORDER — DOXYCYCLINE HYCLATE 100 MG PO TABS: 100.0000 mg | ORAL_TABLET | Freq: Two times a day (BID) | ORAL | Status: DC

## 2014-12-19 NOTE — Progress Notes (Signed)
Pt aware of urine cx & rx sent to pharmacy. Informed to return after antibiotic is finished.

## 2015-01-02 DIAGNOSIS — Z86008 Personal history of in-situ neoplasm of other site: Secondary | ICD-10-CM | POA: Diagnosis not present

## 2015-01-09 ENCOUNTER — Other Ambulatory Visit: Payer: Self-pay | Admitting: Urology

## 2015-01-09 ENCOUNTER — Encounter (HOSPITAL_BASED_OUTPATIENT_CLINIC_OR_DEPARTMENT_OTHER): Payer: Self-pay | Admitting: *Deleted

## 2015-01-10 ENCOUNTER — Encounter (HOSPITAL_BASED_OUTPATIENT_CLINIC_OR_DEPARTMENT_OTHER): Payer: Self-pay | Admitting: *Deleted

## 2015-01-11 ENCOUNTER — Encounter (HOSPITAL_BASED_OUTPATIENT_CLINIC_OR_DEPARTMENT_OTHER): Payer: Self-pay | Admitting: *Deleted

## 2015-01-11 NOTE — H&P (Signed)
Active Problems Problems  1. Balanitis (N48.1) 2. Benign prostatic hyperplasia with urinary obstruction (N40.1) 3. Bladder cancer (C67.9) 4. History of carcinoma in situ of bladder (Z86.008) 5. Nocturia (R35.1) 6. Post-void dribbling (N39.43)  History of Present Illness Anthony Noble returns today in f/u. He was seen by Dr. Laurance Flatten on 1/7 for back pain and a UA had 7-10 RBC's. The culture grew e. coli and he was treated with doxycycline. He had no dysuria or hematuria. He has a history of low grade non-invasive bladder tumors and a small patch of CIS resected on 07/30/11. He completed a course of BCG on 10/30/11. He had a systemic BCG infection so he was not given maintenance. His last cytology was negative. He continues to use the mycolog for his recurrent balanitis and that has really helped. His PSA was 0.6 in 4/15. He had a negative CT AP in 1/15.   Past Medical History Problems  1. Bladder cancer (C67.9) 2. History of Epididymitis, right (N45.1) 3. History of Guillain-Barre Syndrome 4. History of carcinoma in situ of bladder (Z86.008) 5. History of hypercholesterolemia (Z86.39) 6. History of prostatitis (Z87.438) 7. History of urinary tract infection (Z87.440) 8. History of Primary hypothyroidism (E03.9) 9. History of Vertebral Artery Stenosis  Surgical History Problems  1. History of Cystoscopy With Fulguration Medium Lesion (2-5cm) 2. History of Cystoscopy With Fulguration Medium Lesion (2-5cm) 3. History of Tonsillectomy 4. History of Wrist Surgery  Current Meds 1. Aspirin 81 MG Oral Tablet;  Therapy: (Recorded:08Aug2012) to Recorded 2. Calcium Citrate-Vitamin D3 TABS;  Therapy: (Recorded:16Jul2014) to Recorded 3. CoQ-10 200 MG Oral Capsule;  Therapy: (Recorded:16Jul2014) to Recorded 4. Fish Oil CAPS;  Therapy: (Recorded:16Jul2014) to Recorded 5. Levothyroxine Sodium 50 MCG Oral Tablet;  Therapy: (Recorded:16Jul2014) to Recorded 6. Magnesium 400 MG Oral Capsule;  Therapy:  (Recorded:16Jul2014) to Recorded 7. Multi-Vitamin TABS;  Therapy: (Recorded:03May2010) to Recorded 8. Nystatin 100000 UNIT/GM External Ointment; apply to affected area sparingly 2-3x daily;  Therapy: [id number] to (Last Rx:24Sep2015)  Requested for: 24Sep2015 Ordered 9. Nystatin-Triamcinolone 010932-3.5 UNIT/GM-% External Cream; APPLY SPARINGLY TO  AFFECTED AREA(S) 3 TIMES A DAY  Requested for:  21Jan2015; Last Rx:21Jan2015 Ordered 10. Vitamin D3 2000 UNIT Oral Tablet;   Therapy: (Recorded:16Jul2014) to Recorded 11. Zinc TABS;   Therapy: (Recorded:16Jul2014) to Recorded  Allergies Medication  1. Septra TABS 2. Sulfur  Family History Problems  1. Family history of Breast Cancer : Maternal Grandmother 2. Family history of Family Health Status Number Of Children : Maternal Grandmother   2 daughters 3. Family history of Ovarian Cancer : Mother  Social History Problems  1. Alcohol Use   ocassionally 2. Caffeine Use   4 3. Former smoker [phone]) 4. Marital History - Currently Married 5. Occupation:   Press photographer 6. History of Tobacco Use   quit 20 years ago; smoked one pack per day for 18 years  Review of Systems  Genitourinary: penile lesion, but no dysuria and no hematuria.  Musculoskeletal: no back pain.    Vitals Vital Signs [Data Includes: Last 1 Day]  Recorded: 25Jan2016 03:13PM  Height: 5 ft 9 in Weight: 176 lb  BMI Calculated: 25.99 BSA Calculated: 1.96 Blood Pressure: 145 / 72 Temperature: 97.9 F Heart Rate: 73  Results/Data Urine [Data Includes: Last 1 Day]   [id number]  COLOR YELLOW   APPEARANCE CLEAR   SPECIFIC GRAVITY 1.030   pH 5.5   GLUCOSE NEG mg/dL  BILIRUBIN NEG   KETONE NEG mg/dL  BLOOD TRACE   PROTEIN NEG mg/dL  UROBILINOGEN  0.2 mg/dL  NITRITE NEG   LEUKOCYTE ESTERASE NEG   SQUAMOUS EPITHELIAL/HPF MODERATE   WBC NONE SEEN WBC/hpf  RBC 0-2 RBC/hpf  BACTERIA NONE SEEN   CRYSTALS NONE SEEN   CASTS NONE SEEN     Procedure  Procedure: Cystoscopy   Indication: History of Urothelial Carcinoma.  Informed Consent: Risks, benefits, and potential adverse events were discussed and informed consent was obtained from the patient.  Prep: The patient was prepped with betadine.  Antibiotic prophylaxis: Ciprofloxacin.  Procedure Note:  Urethral meatus:. No abnormalities.  Anterior urethra: No abnormalities.  Prostatic urethra: No abnormalities . Estimated length was 2 cm. There was visual obstruction of the prostatic urethra. The lateral prostatic lobes were enlarged.  Bladder: Visulization was clear. The ureteral orifices were in the normal anatomic position bilaterally and had clear efflux of urine. A systematic survey of the bladder demonstrated no bladder tumors or stones. The mucosa was smooth without abnormalities. Examination of the bladder demonstrated mild trabeculation erythematous mucosa (there is a small faintly erythematous area on the right base of the bladder about 1.5cm in size and there is some increased vascularity on the left bladder wall. ). A saline bladder washing was obtained and sent for cytologic analysis. The patient tolerated the procedure well.  Complications: None.    Assessment Assessed  1. History of carcinoma in situ of bladder (Z86.008) 2. History of urinary tract infection (Z87.440)  He has two small areas of erythema in the bladder but was just treated for a UTI.   Plan Balanitis, Benign prostatic hyperplasia with urinary obstruction, Bladder cancer, Health Maintenance, PMH: History of carcinoma in situ of bladder, Nocturia  1. Follow-up Month x 3 Office  Follow-up  Status: Hold For - Appointment,Date of Service   Requested for: 25Jan2016 2. Cysto; Status:Hold For - Appointment,Date of Service; Requested [id number];  Health Maintenance  3. UA With REFLEX; [Do Not Release]; Status:Resulted - Requires Verification;   Done:  [id number] 03:01PM PMH: History of carcinoma  in situ of bladder  4. URINE CYTOLOGY; Status:In Progress - Specimen/Data Collected;   Done: [id number]  I am going to send a cytology and if that is clear I will just repeat a cystoscopy in 3 months.  If the cytology is abnormal I will set him up for an outpatient biopsy. I reviewed the risks of bleeding, infection, bladder and urethral injury and anesthetic risks.   Discussion/Summary CC: Dr. Morrie Sheldon.

## 2015-01-11 NOTE — Progress Notes (Signed)
NPO AFTER MN. ARRIVE AT 0830. NEEDS HG .  WILL TAKE LIPITOR AND SYNTHROID AM DOS W/ SIPS OF WATER.

## 2015-01-12 ENCOUNTER — Ambulatory Visit (HOSPITAL_BASED_OUTPATIENT_CLINIC_OR_DEPARTMENT_OTHER): Payer: 59 | Admitting: Anesthesiology

## 2015-01-12 ENCOUNTER — Encounter (HOSPITAL_BASED_OUTPATIENT_CLINIC_OR_DEPARTMENT_OTHER): Payer: Self-pay | Admitting: Anesthesiology

## 2015-01-12 ENCOUNTER — Ambulatory Visit (HOSPITAL_BASED_OUTPATIENT_CLINIC_OR_DEPARTMENT_OTHER)
Admission: RE | Admit: 2015-01-12 | Discharge: 2015-01-12 | Disposition: A | Discharge status: Home / Self Care | Payer: 59 | Source: Ambulatory Visit | Attending: Urology | Admitting: Urology

## 2015-01-12 ENCOUNTER — Encounter (HOSPITAL_BASED_OUTPATIENT_CLINIC_OR_DEPARTMENT_OTHER)
Admission: RE | Disposition: A | Discharge status: Home / Self Care | Payer: Self-pay | Source: Ambulatory Visit | Attending: Urology

## 2015-01-12 DIAGNOSIS — D090 Carcinoma in situ of bladder: Secondary | ICD-10-CM | POA: Diagnosis not present

## 2015-01-12 DIAGNOSIS — N3020 Other chronic cystitis without hematuria: Secondary | ICD-10-CM | POA: Insufficient documentation

## 2015-01-12 DIAGNOSIS — Z882 Allergy status to sulfonamides status: Secondary | ICD-10-CM | POA: Diagnosis not present

## 2015-01-12 DIAGNOSIS — N401 Enlarged prostate with lower urinary tract symptoms: Secondary | ICD-10-CM | POA: Insufficient documentation

## 2015-01-12 DIAGNOSIS — Z87440 Personal history of urinary (tract) infections: Secondary | ICD-10-CM | POA: Diagnosis not present

## 2015-01-12 DIAGNOSIS — Z7982 Long term (current) use of aspirin: Secondary | ICD-10-CM | POA: Insufficient documentation

## 2015-01-12 DIAGNOSIS — Z87891 Personal history of nicotine dependence: Secondary | ICD-10-CM | POA: Insufficient documentation

## 2015-01-12 DIAGNOSIS — Z888 Allergy status to other drugs, medicaments and biological substances status: Secondary | ICD-10-CM | POA: Insufficient documentation

## 2015-01-12 DIAGNOSIS — E039 Hypothyroidism, unspecified: Secondary | ICD-10-CM | POA: Insufficient documentation

## 2015-01-12 DIAGNOSIS — G610 Guillain-Barre syndrome: Secondary | ICD-10-CM | POA: Insufficient documentation

## 2015-01-12 DIAGNOSIS — Z8551 Personal history of malignant neoplasm of bladder: Secondary | ICD-10-CM | POA: Diagnosis not present

## 2015-01-12 DIAGNOSIS — C675 Malignant neoplasm of bladder neck: Secondary | ICD-10-CM | POA: Diagnosis not present

## 2015-01-12 DIAGNOSIS — E780 Pure hypercholesterolemia: Secondary | ICD-10-CM | POA: Insufficient documentation

## 2015-01-12 DIAGNOSIS — N139 Obstructive and reflux uropathy, unspecified: Secondary | ICD-10-CM | POA: Diagnosis not present

## 2015-01-12 DIAGNOSIS — N3289 Other specified disorders of bladder: Secondary | ICD-10-CM | POA: Diagnosis not present

## 2015-01-12 DIAGNOSIS — C679 Malignant neoplasm of bladder, unspecified: Secondary | ICD-10-CM | POA: Diagnosis present

## 2015-01-12 DIAGNOSIS — C672 Malignant neoplasm of lateral wall of bladder: Secondary | ICD-10-CM | POA: Insufficient documentation

## 2015-01-12 HISTORY — DX: Personal history of other diseases of the respiratory system: Z87.09

## 2015-01-12 HISTORY — DX: Personal history of (healed) traumatic fracture: Z87.81

## 2015-01-12 HISTORY — DX: Personal history of colonic polyps: Z86.010

## 2015-01-12 HISTORY — DX: Benign prostatic hyperplasia with lower urinary tract symptoms: N40.1

## 2015-01-12 HISTORY — DX: Other obstructive and reflux uropathy: N13.8

## 2015-01-12 HISTORY — DX: Personal history of malignant neoplasm of bladder: Z85.51

## 2015-01-12 HISTORY — DX: Personal history of colon polyps, unspecified: Z86.0100

## 2015-01-12 HISTORY — DX: Occlusion and stenosis of bilateral carotid arteries: I65.23

## 2015-01-12 HISTORY — PX: CYSTOSCOPY WITH BIOPSY: SHX5122

## 2015-01-12 HISTORY — DX: Diverticulosis of large intestine without perforation or abscess without bleeding: K57.30

## 2015-01-12 LAB — POCT HEMOGLOBIN-HEMACUE: Hemoglobin: 13.7 g/dL (ref 13.0–17.0)

## 2015-01-12 SURGERY — CYSTOSCOPY, WITH BIOPSY
Anesthesia: General

## 2015-01-12 MED ORDER — STERILE WATER FOR IRRIGATION IR SOLN
Status: DC | PRN
  Administered 2015-01-12: 3000 mL

## 2015-01-12 MED ORDER — DEXAMETHASONE SODIUM PHOSPHATE 4 MG/ML IJ SOLN
INTRAMUSCULAR | Status: DC | PRN
  Administered 2015-01-12: 10 mg via INTRAVENOUS

## 2015-01-12 MED ORDER — FENTANYL CITRATE 0.05 MG/ML IJ SOLN
INTRAMUSCULAR | Status: DC | PRN
  Administered 2015-01-12: 50 ug via INTRAVENOUS

## 2015-01-12 MED ORDER — SODIUM CHLORIDE 0.9 % IV SOLN
250.0000 mL | INTRAVENOUS | Status: DC | PRN
  Filled 2015-01-12: qty 250

## 2015-01-12 MED ORDER — IOHEXOL 350 MG/ML SOLN
INTRAVENOUS | Status: DC | PRN
  Administered 2015-01-12: 10 mL via URETHRAL

## 2015-01-12 MED ORDER — HYDROMORPHONE HCL 1 MG/ML IJ SOLN
0.2500 mg | INTRAMUSCULAR | Status: DC | PRN
  Filled 2015-01-12: qty 1

## 2015-01-12 MED ORDER — HYDROCODONE-ACETAMINOPHEN 5-325 MG PO TABS: 1.0000 | ORAL_TABLET | Freq: Four times a day (QID) | ORAL | Status: DC | PRN

## 2015-01-12 MED ORDER — PHENAZOPYRIDINE HCL 200 MG PO TABS: 200.0000 mg | ORAL_TABLET | Freq: Three times a day (TID) | ORAL | Status: DC | PRN

## 2015-01-12 MED ORDER — FENTANYL CITRATE 0.05 MG/ML IJ SOLN
25.0000 ug | INTRAMUSCULAR | Status: DC | PRN
  Filled 2015-01-12: qty 1

## 2015-01-12 MED ORDER — CIPROFLOXACIN IN D5W 400 MG/200ML IV SOLN
400.0000 mg | INTRAVENOUS | Status: AC
  Administered 2015-01-12: 400 mg via INTRAVENOUS
  Filled 2015-01-12: qty 200

## 2015-01-12 MED ORDER — OXYCODONE HCL 5 MG PO TABS
5.0000 mg | ORAL_TABLET | ORAL | Status: DC | PRN
  Filled 2015-01-12: qty 2

## 2015-01-12 MED ORDER — OXYCODONE HCL 5 MG PO TABS
5.0000 mg | ORAL_TABLET | Freq: Once | ORAL | Status: DC | PRN
  Filled 2015-01-12: qty 1

## 2015-01-12 MED ORDER — ACETAMINOPHEN 650 MG RE SUPP
650.0000 mg | RECTAL | Status: DC | PRN
  Filled 2015-01-12: qty 1

## 2015-01-12 MED ORDER — ONDANSETRON HCL 4 MG/2ML IJ SOLN
INTRAMUSCULAR | Status: DC | PRN
  Administered 2015-01-12: 4 mg via INTRAVENOUS

## 2015-01-12 MED ORDER — PROMETHAZINE HCL 25 MG/ML IJ SOLN
6.2500 mg | INTRAMUSCULAR | Status: DC | PRN
  Filled 2015-01-12: qty 1

## 2015-01-12 MED ORDER — SODIUM CHLORIDE 0.9 % IJ SOLN
3.0000 mL | INTRAMUSCULAR | Status: DC | PRN
  Filled 2015-01-12: qty 3

## 2015-01-12 MED ORDER — SODIUM CHLORIDE 0.9 % IJ SOLN
3.0000 mL | Freq: Two times a day (BID) | INTRAMUSCULAR | Status: DC
  Filled 2015-01-12: qty 3

## 2015-01-12 MED ORDER — EPHEDRINE SULFATE 50 MG/ML IJ SOLN
INTRAMUSCULAR | Status: DC | PRN
  Administered 2015-01-12 (×2): 10 mg via INTRAVENOUS

## 2015-01-12 MED ORDER — CIPROFLOXACIN IN D5W 400 MG/200ML IV SOLN
INTRAVENOUS | Status: AC
  Filled 2015-01-12: qty 200

## 2015-01-12 MED ORDER — PROPOFOL 10 MG/ML IV BOLUS
INTRAVENOUS | Status: DC | PRN
  Administered 2015-01-12: 150 mg via INTRAVENOUS

## 2015-01-12 MED ORDER — ACETAMINOPHEN 325 MG PO TABS
650.0000 mg | ORAL_TABLET | ORAL | Status: DC | PRN
Start: 2015-01-12 — End: 2015-01-12
  Filled 2015-01-12: qty 2

## 2015-01-12 MED ORDER — OXYCODONE HCL 5 MG/5ML PO SOLN
5.0000 mg | Freq: Once | ORAL | Status: DC | PRN
  Filled 2015-01-12: qty 5

## 2015-01-12 MED ORDER — LIDOCAINE HCL (CARDIAC) 20 MG/ML IV SOLN
INTRAVENOUS | Status: DC | PRN
  Administered 2015-01-12: 50 mg via INTRAVENOUS

## 2015-01-12 MED ORDER — LACTATED RINGERS IV SOLN
INTRAVENOUS | Status: DC
  Administered 2015-01-12: 10:00:00 via INTRAVENOUS
  Filled 2015-01-12: qty 1000

## 2015-01-12 MED ORDER — FENTANYL CITRATE 0.05 MG/ML IJ SOLN
INTRAMUSCULAR | Status: AC
  Filled 2015-01-12: qty 4

## 2015-01-12 SURGICAL SUPPLY — 20 items
BAG DRAIN URO-CYSTO SKYTR STRL (DRAIN) ×2 IMPLANT
BAG DRN UROCATH (DRAIN) ×1
CANISTER SUCT LVC 12 LTR MEDI- (MISCELLANEOUS) ×1 IMPLANT
CATH FOLEY 2WAY SLVR  5CC 16FR (CATHETERS)
CATH FOLEY 2WAY SLVR 5CC 16FR (CATHETERS) IMPLANT
CATH URET 5FR 28IN OPEN ENDED (CATHETERS) ×1 IMPLANT
CLOTH BEACON ORANGE TIMEOUT ST (SAFETY) ×2 IMPLANT
DRAPE CAMERA CLOSED 9X96 (DRAPES) ×2 IMPLANT
ELECT REM PT RETURN 9FT ADLT (ELECTROSURGICAL) ×2
ELECTRODE REM PT RTRN 9FT ADLT (ELECTROSURGICAL) ×1 IMPLANT
GLOVE SURG SS PI 8.0 STRL IVOR (GLOVE) ×6 IMPLANT
GOWN PREVENTION PLUS LG XLONG (DISPOSABLE) ×4 IMPLANT
GOWN STRL REIN XL XLG (GOWN DISPOSABLE) ×1 IMPLANT
NDL SAFETY ECLIPSE 18X1.5 (NEEDLE) IMPLANT
NEEDLE HYPO 18GX1.5 SHARP (NEEDLE)
NEEDLE HYPO 22GX1.5 SAFETY (NEEDLE) IMPLANT
NS IRRIG 500ML POUR BTL (IV SOLUTION) IMPLANT
PACK CYSTO (CUSTOM PROCEDURE TRAY) ×2 IMPLANT
SYR 20CC LL (SYRINGE) IMPLANT
WATER STERILE IRR 3000ML UROMA (IV SOLUTION) ×1 IMPLANT

## 2015-01-12 NOTE — Anesthesia Postprocedure Evaluation (Signed)
Anesthesia Post Note  Patient: Anthony Noble [facility].  Procedure(s) Performed: Procedure(s) (LRB): CYSTOSCOPY WITH BLADDER  BIOPSY, FULGERATION (N/A)  Anesthesia type: general  Patient location: PACU  Post pain: Pain level controlled  Post assessment: Patient's Cardiovascular Status Stable  Last Vitals:  Filed Vitals:   01/12/15 1115  BP: 155/75  Pulse: 67  Temp:   Resp: 11    Post vital signs: Reviewed and stable  Level of consciousness: sedated  Complications: No apparent anesthesia complications

## 2015-01-12 NOTE — Transfer of Care (Signed)
Immediate Anesthesia Transfer of Care Note  Patient: Anthony Noble.  Procedure(s) Performed: Procedure(s): CYSTOSCOPY WITH BLADDER  BIOPSY, FULGERATION (N/A)  Patient Location: PACU  Anesthesia Type:General  Level of Consciousness: awake and oriented  Airway & Oxygen Therapy: Patient Spontanous Breathing and Patient connected to nasal cannula oxygen  Post-op Assessment: Report given to RN  Post vital signs: Reviewed and stable  Last Vitals:  Filed Vitals:   01/12/15 0843  BP: 149/75  Pulse: 65  Temp: 36.6 C  Resp: 16    Complications: No apparent anesthesia complications

## 2015-01-12 NOTE — Anesthesia Procedure Notes (Signed)
Procedure Name: LMA Insertion Date/Time: 01/12/2015 10:02 AM Performed by: Bethena Roys T Pre-anesthesia Checklist: Patient identified, Emergency Drugs available, Suction available and Patient being monitored Patient Re-evaluated:Patient Re-evaluated prior to inductionOxygen Delivery Method: Circle System Utilized Preoxygenation: Pre-oxygenation with 100% oxygen Intubation Type: IV induction Ventilation: Mask ventilation without difficulty LMA: LMA inserted LMA Size: 4.0 Number of attempts: 1 Airway Equipment and Method: bite block Placement Confirmation: positive ETCO2 Dental Injury: Teeth and Oropharynx as per pre-operative assessment

## 2015-01-12 NOTE — Interval H&P Note (Signed)
History and Physical Interval Note:  01/12/2015 8:44 AM  Anthony Noble.  has presented today for surgery, with the diagnosis of HISTORY OF BLADDER CANCER  The various methods of treatment have been discussed with the patient and family. After consideration of risks, benefits and other options for treatment, the patient has consented to  Procedure(s): CYSTOSCOPY WITH BLADDER  BIOPSY (N/A) as a surgical intervention .  The patient's history has been reviewed, patient examined, no change in status, stable for surgery.  I have reviewed the patient's chart and labs.  Questions were answered to the patient's satisfaction.     Giorgio Chabot J

## 2015-01-12 NOTE — Anesthesia Preprocedure Evaluation (Addendum)
Anesthesia Evaluation  Patient identified by MRN, date of birth, ID band Patient awake    Reviewed: Allergy & Precautions, NPO status , Patient's Chart, lab work & pertinent test results  History of Anesthesia Complications Negative for: history of anesthetic complications  Airway Mallampati: II  TM Distance: >3 FB Neck ROM: Full    Dental  (+) Teeth Intact, Dental Advisory Given   Pulmonary COPDformer smoker,    Pulmonary exam normal       Cardiovascular + Peripheral Vascular Disease Rhythm:Regular Rate:Normal + Systolic murmurs    Neuro/Psych    GI/Hepatic Neg liver ROS, GERD-  ,  Endo/Other  Hypothyroidism   Renal/GU negative Renal ROS     Musculoskeletal  (+) Arthritis -,   Abdominal   Peds  Hematology negative hematology ROS (+)   Anesthesia Other Findings   Reproductive/Obstetrics                            Anesthesia Physical Anesthesia Plan  ASA: III  Anesthesia Plan: General   Post-op Pain Management:    Induction: Intravenous  Airway Management Planned: LMA  Additional Equipment:   Intra-op Plan:   Post-operative Plan: Extubation in OR  Informed Consent: I have reviewed the patients History and Physical, chart, labs and discussed the procedure including the risks, benefits and alternatives for the proposed anesthesia with the patient or authorized representative who has indicated his/her understanding and acceptance.   Dental advisory given  Plan Discussed with: CRNA  Anesthesia Plan Comments:         Anesthesia Quick Evaluation

## 2015-01-12 NOTE — Discharge Instructions (Addendum)
CYSTOSCOPY HOME CARE INSTRUCTIONS  Activity: Rest for the remainder of the day.  Do not drive or operate equipment today.  You may resume normal activities on Sunday as instructed by your physician.   Meals: Drink plenty of liquids and eat light foods such as gelatin or soup this evening.  You may return to a normal meal plan tomorrow.  Return to Work: You may return to work in one to two days or as instructed by your physician.  Special Instructions / Symptoms: Call your physician if any of these symptoms occur:   -persistent or heavy bleeding  -bleeding which continues after first few urination  -large blood clots that are difficult to pass  -urine stream diminishes or stops completely  -fever equal to or higher than 101 degrees Farenheit.  -cloudy urine with a strong, foul odor  -severe pain  Females should always wipe from front to back after elimination.  You may feel some burning pain when you urinate.  This should disappear with time.  Applying moist heat to the lower abdomen or a hot tub bath may help relieve the pain. \   Patient Signature:  ________________________________________________________  Nurse's Signature:  ________________________________________________________  Post Anesthesia Home Care Instructions  Activity: Get plenty of rest for the remainder of the day. A responsible adult should stay with you for 24 hours following the procedure.  For the next 24 hours, DO NOT: -Drive a car -Paediatric nurse -Drink alcoholic beverages -Take any medication unless instructed by your physician -Make any legal decisions or sign important papers.  Meals: Start with liquid foods such as gelatin or soup. Progress to regular foods as tolerated. Avoid greasy, spicy, heavy foods. If nausea and/or vomiting occur, drink only clear liquids until the nausea and/or vomiting subsides. Call your physician if vomiting continues.  Special Instructions/Symptoms: Your throat may  feel dry or sore from the anesthesia or the breathing tube placed in your throat during surgery. If this causes discomfort, gargle with warm salt water. The discomfort should disappear within 24 hours.

## 2015-01-12 NOTE — Brief Op Note (Signed)
01/12/2015  10:33 AM  PATIENT:  Anthony Noble.  74 y.o. male  PRE-OPERATIVE DIAGNOSIS:  HISTORY OF BLADDER CANCER  POST-OPERATIVE DIAGNOSIS:  Recurrent  bladder cancer  PROCEDURE:  Procedure(s): CYSTOSCOPY WITH BLADDER  BIOPSY, FULGERATION (N/A) of 3cm tumor on right bladder neck and 0.44mm on left lateral wall. BILATERAL RTG's with Interp.   SURGEON:  Surgeon(s) and Role:    * Malka So, MD - Primary  PHYSICIAN ASSISTANT:   ASSISTANTS: none   ANESTHESIA:   general  EBL:  Total I/O In: 200 [I.V.:200] Out: -   BLOOD ADMINISTERED:none  DRAINS: none   LOCAL MEDICATIONS USED:  NONE  SPECIMEN:  Source of Specimen:  biopsies from right bladder neck, right lateral bladder neck and left lateral wall.   DISPOSITION OF SPECIMEN:  PATHOLOGY  COUNTS:  YES  TOURNIQUET:  * No tourniquets in log *  DICTATION: .Other Dictation: Dictation Number [id number]  PLAN OF CARE: Discharge to home after PACU  PATIENT DISPOSITION:  PACU - hemodynamically stable.   Delay start of Pharmacological VTE agent (>24hrs) due to surgical blood loss or risk of bleeding: yes

## 2015-01-13 ENCOUNTER — Encounter (HOSPITAL_BASED_OUTPATIENT_CLINIC_OR_DEPARTMENT_OTHER): Payer: Self-pay | Admitting: Urology

## 2015-01-13 NOTE — Op Note (Signed)
NAME:  Anthony Noble, Anthony Noble             ACCOUNT NO.:  [id number]  MEDICAL RECORD NO.:  [id number]  LOCATION:                                 FACILITY:  PHYSICIAN:  Marshall Cork. Jeffie Pollock, M.D.    DATE OF BIRTH:  1941-04-08  DATE OF PROCEDURE:  01/12/2015 DATE OF DISCHARGE:  01/12/2015                              OPERATIVE REPORT   PROCEDURE: 1. Cystoscopy with bilateral retrograde pyelograms and interpretation. 2. Biopsy and fulguration of right bladder neck and lateral wall     tumor, 3 cm in size. 3. Biopsy and fulguration of 0.5 mm tumor on left lateral wall.  PREOPERATIVE DIAGNOSIS:  History of bladder cancer with probable recurrence.  POSTOPERATIVE DIAGNOSIS:  Recurrent tumors of the right bladder neck and left lateral wall.  SURGEON:  Marshall Cork. Jeffie Pollock, M.D.  ANESTHESIA:  General.  SPECIMEN:  Cup biopsies from the right bladder neck, right lateral bladder neck, and left lateral wall.  BLOOD LOSS:  Minimal.  DRAINS:  None.  COMPLICATIONS:  None.  INDICATIONS:  Su is a 74 year old white male with a history of bladder tumors, who was found on recent surveillance cystoscopy to have possible recurrence at the right bladder neck that was felt to require biopsy.  FINDINGS AND PROCEDURE:  He was given Cipro.  He was taken to the operating room where general anesthetic was induced.  He was placed in lithotomy position.  His perineum and genitalia were prepped with Betadine solution and he was draped in usual sterile fashion.  He was fitted with PAS hose.  Cystoscopy was performed using the 22-French scope, 12 and 70-degree lenses.  Examination revealed a normal urethra.  The external sphincter was intact.  The prostatic urethra showed bilobar hyperplasia with minimal obstruction.  Examination of the bladder revealed mild trabeculation.  Ureteral orifice was in normal anatomic position effluxing clear urine.  On the left lateral wall, there was a very small approximately 5 mm area at  the confluence of some blood vessels with some early papillary changes.  There were couple of satellite lesions that also had some tiny papillary lesions.  In the right bladder neck, there was an area of papillary frond formation with mucosal changes that extended out approximately 3 cm on the lateral wall that had multiple individual papilla without convalescence into a papillary tumor, it was worrisome for carcinoma in situ.  Once thorough cystoscopy had been performed, bilateral retrograde pyelograms were performed with a 5-French open-end catheter and Omnipaque.  Right retrograde pyelogram revealed a normal ureter and intrarenal collecting system.  Left retrograde pyelogram revealed a normal ureter and intrarenal collecting system.  After completion of the retrograde pyelograms, a cup biopsy forceps was inserted and biopsies were obtained from the largest tumor at the right bladder neck.  Additional biopsy was obtained from the right lateral bladder neck followed by biopsy from the left lateral wall.  The Bugbee electrode was then used to generously fulgurate the biopsy sites, but also the surrounding area of involved mucosa, which in the right bladder neck and lateral bladder neck was approximately 3 cm in diameter.  The left lateral wall 5 mm tumor was removed with biopsy, but that and  the surrounding satellite lesions were fulgurated in an area approximately 10 x 20 mm in size.  Once all the biopsy sites had been fulgurated and all abnormal mucosa was destroyed, the bladder was drained with clear return.  The patient was taken down from lithotomy position.  His anesthetic was reversed. He was moved to recovery room in stable condition.  There were no complications.     Marshall Cork. Jeffie Pollock, M.D.     JJW/MEDQ  D:  01/12/2015  T:  01/12/2015  Job:  [id number]

## 2015-01-24 DIAGNOSIS — D090 Carcinoma in situ of bladder: Secondary | ICD-10-CM | POA: Diagnosis not present

## 2015-01-30 DIAGNOSIS — C679 Malignant neoplasm of bladder, unspecified: Secondary | ICD-10-CM | POA: Diagnosis not present

## 2015-01-30 DIAGNOSIS — Z5111 Encounter for antineoplastic chemotherapy: Secondary | ICD-10-CM | POA: Diagnosis not present

## 2015-02-06 DIAGNOSIS — B9620 Unspecified Escherichia coli [E. coli] as the cause of diseases classified elsewhere: Secondary | ICD-10-CM | POA: Diagnosis not present

## 2015-02-06 DIAGNOSIS — N390 Urinary tract infection, site not specified: Secondary | ICD-10-CM | POA: Diagnosis not present

## 2015-02-06 DIAGNOSIS — R509 Fever, unspecified: Secondary | ICD-10-CM | POA: Diagnosis not present

## 2015-02-07 ENCOUNTER — Ambulatory Visit: Payer: 59 | Admitting: Family

## 2015-02-07 ENCOUNTER — Other Ambulatory Visit (HOSPITAL_COMMUNITY): Payer: 59

## 2015-02-13 DIAGNOSIS — B9620 Unspecified Escherichia coli [E. coli] as the cause of diseases classified elsewhere: Secondary | ICD-10-CM | POA: Diagnosis not present

## 2015-02-13 DIAGNOSIS — N390 Urinary tract infection, site not specified: Secondary | ICD-10-CM | POA: Diagnosis not present

## 2015-02-13 DIAGNOSIS — D090 Carcinoma in situ of bladder: Secondary | ICD-10-CM | POA: Diagnosis not present

## 2015-02-20 DIAGNOSIS — Z5111 Encounter for antineoplastic chemotherapy: Secondary | ICD-10-CM | POA: Diagnosis not present

## 2015-02-20 DIAGNOSIS — D090 Carcinoma in situ of bladder: Secondary | ICD-10-CM | POA: Diagnosis not present

## 2015-02-23 ENCOUNTER — Encounter: Payer: Self-pay | Admitting: Family Medicine

## 2015-02-23 ENCOUNTER — Ambulatory Visit (INDEPENDENT_AMBULATORY_CARE_PROVIDER_SITE_OTHER): Payer: 59 | Admitting: Family Medicine

## 2015-02-23 ENCOUNTER — Encounter (INDEPENDENT_AMBULATORY_CARE_PROVIDER_SITE_OTHER): Payer: Self-pay

## 2015-02-23 VITALS — BP 129/69 | HR 79 | Temp 97.0°F | Ht 69.0 in

## 2015-02-23 DIAGNOSIS — E031 Congenital hypothyroidism without goiter: Secondary | ICD-10-CM

## 2015-02-23 DIAGNOSIS — Z8551 Personal history of malignant neoplasm of bladder: Secondary | ICD-10-CM | POA: Diagnosis not present

## 2015-02-23 DIAGNOSIS — R011 Cardiac murmur, unspecified: Secondary | ICD-10-CM | POA: Diagnosis not present

## 2015-02-23 DIAGNOSIS — R252 Cramp and spasm: Secondary | ICD-10-CM | POA: Diagnosis not present

## 2015-02-23 NOTE — Patient Instructions (Signed)
Make a special effort to drink a lot more water, carry a bottle of water with you in the car, drink a couple bottles of water while you're playing golf, drink water when you're exercising and when you finished exercising We will call you with the lab work results once we get those results back Because of the systolic ejection murmur we will arrange for you to see the cardiologist and possibly have an echocardiogram Please get the carotid Dopplers that you have planned to make sure if you've get those that you we get a copy of the results and that you take a copy of the results to the cardiology visit

## 2015-02-23 NOTE — Progress Notes (Signed)
Subjective:    Patient ID: [name], male    DOB: 17-Feb-1941, 74 y.o.   MRN: [id number]  HPI Patient here today for cramps in hands and legs frequently. He also states that Dr Jeffie Pollock mentioned that he has a heart murmur. He is receiving treatments for his bladder cancer. He gives a history that his mom had a lot of cramps 2. He says he drinks plenty of water. He indicates the cramps seem to be worse after playing golf and after more activity or even after exercising. He indicates he may not be drinking enough water. We will look at other causes for his cramping with the lab work that we plan to do today.         Patient Active Problem List   Diagnosis Date Noted  . History of bladder cancer 04/06/2014  . History of Guillain-Barre syndrome 04/06/2014  . Personal history of colonic polyps 04/06/2014  . BPH (benign prostatic hyperplasia) 04/06/2014  . Erectile dysfunction 04/06/2014  . Hyperlipidemia 04/06/2014  . Occlusion and stenosis of carotid artery without mention of cerebral infarction 02/01/2014  . Hypothyroidism 03/30/2013  . BCG vaccine causing adverse effect in therapeutic use 02/06/2012  . Epididymitis 01/13/2012  . Carotid stenosis 07/25/2011   Outpatient Encounter Prescriptions as of 02/23/2015  Medication Sig  . aspirin 81 MG EC tablet Take 81 mg by mouth daily.    Marland Kitchen atorvastatin (LIPITOR) 20 MG tablet Take 1 tablet (20 mg total) by mouth daily. (Patient taking differently: Take 20 mg by mouth every morning. )  . Calcium Carbonate-Vitamin D (CALCIUM 500/VITAMIN D PO) Take 1 tablet by mouth. 800 IU  . Cholecalciferol (VITAMIN D) 2000 UNITS tablet Take 2,000 Units by mouth daily.    . Coenzyme Q10 (COQ10) 200 MG CAPS Take 1 capsule by mouth daily.  Marland Kitchen levothyroxine (SYNTHROID, LEVOTHROID) 50 MCG tablet Take 50 mcg by mouth daily before breakfast.  . Magnesium 500 MG CAPS Take 1 capsule by mouth daily.  . Multiple Minerals-Vitamins (CALCIUM-MAGNESIUM-ZINC-D3)  TABS Take 1 capsule by mouth daily.  . Multiple Vitamin (MULTIVITAMIN PO) Take 1 tablet by mouth daily.   . Omega-3 Fatty Acids (FISH OIL) 1000 MG CAPS Take 1 capsule by mouth daily.  . [DISCONTINUED] HYDROcodone-acetaminophen (NORCO) 5-325 MG per tablet Take 1 tablet by mouth every 6 (six) hours as needed for moderate pain.  . [DISCONTINUED] phenazopyridine (PYRIDIUM) 200 MG tablet Take 1 tablet (200 mg total) by mouth 3 (three) times daily as needed for pain.    Review of Systems  Constitutional: Negative.   HENT: Negative.   Eyes: Negative.   Respiratory: Negative.   Cardiovascular: Negative.   Gastrointestinal: Negative.   Endocrine: Negative.   Genitourinary: Negative.   Musculoskeletal: Negative.        Cramping of legs and hands  Skin: Negative.   Allergic/Immunologic: Negative.   Neurological: Negative.   Hematological: Negative.   Psychiatric/Behavioral: Negative.        Objective:   Physical Exam  Constitutional: He is oriented to person, place, and time. He appears well-developed and well-nourished.  HENT:  Head: Normocephalic and atraumatic.  Eyes: Conjunctivae and EOM are normal. Pupils are equal, round, and reactive to light. Right eye exhibits no discharge. Left eye exhibits no discharge. No scleral icterus.  Neck: Normal range of motion. Neck supple. No thyromegaly present.  Cardiovascular: Normal rate, regular rhythm and intact distal pulses.  Exam reveals no gallop and no friction rub.   Murmur heard.  Grade 2/6 systolic ejection murmur. At 72/m  Pulmonary/Chest: Effort normal and breath sounds normal. No respiratory distress. He has no wheezes. He has no rales. He exhibits no tenderness.  Abdominal: Soft. Bowel sounds are normal. He exhibits no mass. There is no tenderness. There is no rebound and no guarding.  Musculoskeletal: Normal range of motion. He exhibits no edema or tenderness.  The patient has very) on both feet and ankle  Lymphadenopathy:    He  has no cervical adenopathy.  Neurological: He is alert and oriented to person, place, and time. He has normal reflexes.  Skin: Skin is warm and dry. No rash noted. No erythema. No pallor.  Psychiatric: He has a normal mood and affect. His behavior is normal. Judgment and thought content normal.  Nursing note and vitals reviewed.  BP 129/69 mmHg  Pulse 79  Temp(Src) 97 F (36.1 C) (Oral)  Ht 5' 9"  (1.753 m)  PF 184 L/min        Assessment & Plan:  1. Hand cramps - BMP8+EGFR - Anemia Profile B - Magnesium  2. Cramp of both lower extremities -These are worse particularly after exercise and later in the day - BMP8+EGFR - Anemia Profile B - Magnesium  3. 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 History of primary bladder cancer -This is been followed by the urologist  5. Systolic ejection murmur -We will arrange for him to have a visit with the cardiologist and get an echocardiogram  Patient Instructions  Make a special effort to drink a lot more water, carry a bottle of water with you in the car, drink a couple bottles of water while you're playing golf, drink water when you're exercising and when you finished exercising We will call you with the lab work results once we get those results back Because of the systolic ejection murmur we will arrange for you to see the cardiologist and possibly have an echocardiogram Please get the carotid Dopplers that you have planned to make sure if you've get those that you we get a copy of the results and that you take a copy of the results to the cardiology visit   Arrie Senate MD

## 2015-02-23 NOTE — Addendum Note (Signed)
Addended by: Zannie Cove on: 02/23/2015 05:11 PM   Modules accepted: Orders

## 2015-02-24 ENCOUNTER — Other Ambulatory Visit: Payer: Self-pay | Admitting: *Deleted

## 2015-02-24 LAB — BMP8+EGFR
BUN/Creatinine Ratio: 15 (ref 10–22)
BUN: 15 mg/dL (ref 8–27)
CO2: 22 mmol/L (ref 18–29)
Calcium: 8.9 mg/dL (ref 8.6–10.2)
Chloride: 104 mmol/L (ref 97–108)
Creatinine, Ser: 1 mg/dL (ref 0.76–1.27)
GFR calc Af Amer: 87 mL/min/{1.73_m2} (ref >59)
GFR calc non Af Amer: 75 mL/min/{1.73_m2} (ref >59)
Glucose: 131 mg/dL — ABNORMAL HIGH (ref 65–99)
Potassium: 4.3 mmol/L (ref 3.5–5.2)
Sodium: 144 mmol/L (ref 134–144)

## 2015-02-24 LAB — ANEMIA PROFILE B
Basophils Absolute: 0 10*3/uL (ref 0.0–0.2)
Basos: 1 %
Eos: 2 %
Eosinophils Absolute: 0.1 10*3/uL (ref 0.0–0.4)
FOLATE: 19.3 ng/mL (ref >3.0)
Ferritin: 437 ng/mL — ABNORMAL HIGH (ref 30–400)
HEMATOCRIT: 37.1 % — AB (ref 37.5–51.0)
Hemoglobin: 12.1 g/dL — ABNORMAL LOW (ref 12.6–17.7)
Immature Grans (Abs): 0 10*3/uL (ref 0.0–0.1)
Immature Granulocytes: 0 %
Iron Saturation: 14 % — ABNORMAL LOW (ref 15–55)
Iron: 38 ug/dL (ref 38–169)
LYMPHS: 29 %
Lymphocytes Absolute: 1.7 10*3/uL (ref 0.7–3.1)
MCH: 29.2 pg (ref 26.6–33.0)
MCHC: 32.6 g/dL (ref 31.5–35.7)
MCV: 90 fL (ref 79–97)
Monocytes Absolute: 0.4 10*3/uL (ref 0.1–0.9)
Monocytes: 7 %
Neutrophils Absolute: 3.6 10*3/uL (ref 1.4–7.0)
Neutrophils Relative %: 61 %
Platelets: 348 10*3/uL (ref 150–379)
RBC: 4.14 x10E6/uL (ref 4.14–5.80)
RDW: 13.9 % (ref 12.3–15.4)
Retic Ct Pct: 1.4 % (ref 0.6–2.6)
Total Iron Binding Capacity: 276 ug/dL (ref 250–450)
UIBC: 238 ug/dL (ref 111–343)
Vitamin B-12: 479 pg/mL (ref 211–946)
WBC: 5.9 10*3/uL (ref 3.4–10.8)

## 2015-02-24 LAB — MAGNESIUM: Magnesium: 2.2 mg/dL (ref 1.6–2.3)

## 2015-02-24 MED ORDER — INTEGRA F 125-1 MG PO CAPS: 1.0000 | ORAL_CAPSULE | Freq: Every day | ORAL | Status: DC

## 2015-02-27 ENCOUNTER — Telehealth: Payer: Self-pay | Admitting: Family Medicine

## 2015-02-27 NOTE — Telephone Encounter (Signed)
[  pt aware that this would have to be drawn - grey tube septately. He does not want a order placed at this time.     For cramps

## 2015-03-21 ENCOUNTER — Ambulatory Visit (INDEPENDENT_AMBULATORY_CARE_PROVIDER_SITE_OTHER): Payer: 59 | Admitting: *Deleted

## 2015-03-21 ENCOUNTER — Encounter: Payer: Self-pay | Admitting: *Deleted

## 2015-03-21 VITALS — BP 134/70 | HR 69 | Ht 67.0 in | Wt 183.0 lb

## 2015-03-21 DIAGNOSIS — Z0000 Encounter for general adult medical examination without abnormal findings: Secondary | ICD-10-CM

## 2015-03-21 NOTE — Patient Instructions (Signed)

## 2015-03-29 NOTE — Progress Notes (Signed)
Subjective:   Anthony Fireman Sulaiman Imbert. is a 74 y.o. male who presents for an Initial Medicare Annual Wellness Visit. Anthony Noble lives at home with his wife. He is active and plays golf 3 days per week.    Review of Systems   Cardiac Risk Factors include: advanced age (>50men, >48 women);dyslipidemia;male gender Other systems negative.    Objective:    Today's Vitals   03/21/15 1414  BP: 134/70  Pulse: 69  Height: 5\' 7"  (1.702 m)  Weight: 183 lb (83.008 kg)  BMI                 28.66  Current Medications (verified) Outpatient Encounter Prescriptions as of 03/21/2015  Medication Sig  . aspirin 81 MG EC tablet Take 81 mg by mouth daily.    Marland Kitchen atorvastatin (LIPITOR) 20 MG tablet Take 1 tablet (20 mg total) by mouth daily. (Patient taking differently: Take 20 mg by mouth every morning. )  . Calcium Carbonate-Vitamin D (CALCIUM 500/VITAMIN D PO) Take 1 tablet by mouth. 800 IU  . Cholecalciferol (VITAMIN D) 2000 UNITS tablet Take 2,000 Units by mouth daily.    . Coenzyme Q10 (COQ10) 200 MG CAPS Take 1 capsule by mouth daily.  . Fe Fum-FePoly-FA-Vit C-Vit B3 (INTEGRA F) 125-1 MG CAPS Take 1 tablet by mouth daily.  Marland Kitchen levothyroxine (SYNTHROID, LEVOTHROID) 50 MCG tablet Take 50 mcg by mouth daily before breakfast.  . Magnesium 500 MG CAPS Take 1 capsule by mouth daily.  . Multiple Minerals-Vitamins (CALCIUM-MAGNESIUM-ZINC-D3) TABS Take 1 capsule by mouth daily.  . Omega-3 Fatty Acids (FISH OIL) 1000 MG CAPS Take 1 capsule by mouth daily.  . [DISCONTINUED] Multiple Vitamin (MULTIVITAMIN PO) Take 1 tablet by mouth daily.     Allergies (verified) Bcg live; Septra; and Sulfa antibiotics   History: Past Medical History  Diagnosis Date  . Varicose veins   . Hyperlipidemia   . Leg cramps   . History of bladder cancer     first dx 2004--  tx with chemo bladder instillation  . Bilateral carotid artery stenosis without cerebral infarction     Asymptomatic---bilateral ICA <40%  and mild  stenosis bilateral ECA  . History of colon polyps   . Diverticulosis of colon   . History of pleural effusion     2001  . Guillain-Barre syndrome     DX 2001  . BPH (benign prostatic hypertrophy) with urinary obstruction   . History of vertebral fracture     2001--   T12  . Nocturia   . Wears glasses   . Atypical rash     winter rash  . Guillain-Barre 2001   Past Surgical History  Procedure Laterality Date  . Tonsillectomy  as child  . Video assisted thoracoscopy (vats)/empyema Right 05-01-2000    and Drainage of effusion  . Orif right distal radius fx  12-31-2007  . Transurethral resection of bladder tumor with mitomycin-c  06-07-2003  . Cysto/  bilateral retrograde pylogram/  resection bladder tumor  07-30-2011  . Negative sleep study  YRS AGO per pt  . Cystoscopy with biopsy N/A 01/12/2015    Procedure: CYSTOSCOPY WITH BLADDER  BIOPSY, FULGERATION;  Surgeon: Malka So, MD;  Location[facility];  Service: Urology;  Laterality: N/A;   Family History  Problem Relation Age of Onset  . Other Mother     varicose veins  . Cancer Mother   . COPD Father   . Cancer Maternal Grandmother   .  Bipolar disorder Daughter    Social History   Occupational History  . Not on file.   Social History Main Topics  . Smoking status: Former Smoker -- 1.00 packs/day for 20 years    Types: Cigarettes    Quit date: 12/09/1981  . Smokeless tobacco: Never Used  . Alcohol Use: Yes     Comment: occasional wine  . Drug Use: No  . Sexual Activity: Not on file    Activities of Daily Living In your present state of health, do you have any difficulty performing the following activities: 03/21/2015 01/12/2015  Hearing? N N  Vision? N N  Difficulty concentrating or making decisions? N N  Walking or climbing stairs? N N  Dressing or bathing? N N  Doing errands, shopping? N -  Preparing Food and eating ? N -  Using the Toilet? N -  In the past six months, have you accidently  leaked urine? N -  Do you have problems with loss of bowel control? N -  Managing your Medications? N -  Managing your Finances? N -  Housekeeping or managing your Housekeeping? N -    Immunizations and Health Maintenance  There is no immunization history on file for this patient. Health Maintenance Due  Topic Date Due  . PNA vac Low Risk Adult (1 of 2 - PCV13) 04/10/2007  . TETANUS/TDAP  08/09/2014    Patient Care Team: Chipper Herb, MD as PCP - General (Family Medicine) Irine Seal, MD (Urology) Rolm Bookbinder, MD (Dermatology) Ruta Hinds, MD (Vascular)     Assessment:   This is a routine wellness examination for Anthony Noble.  Hearing/Vision screen No hearing or vision deficits noted during visit today.   Dietary issues and exercise activities discussed: Current Exercise Habits:: Home exercise routine, Type of exercise: strength training/weights;walking;Other - see comments (Elipt[employer]. Plays 18 holes of golf 3 days per week), Time (Minutes): 60, Frequency (Times/Week): 5, Weekly Exercise (Minutes/Week): 300, Intensity: Moderate  Goals    . Exercise 3x per week (30 min per time)      Depression Screen PHQ 2/9 Scores 03/21/2015 04/06/2014 03/24/2012 02/06/2012  PHQ - 2 Score 0 0 0 0    Fall Risk Fall Risk  03/21/2015 04/06/2014  Falls in the past year? No No    Cognitive Function: MMSE - Mini Mental State Exam 03/21/2015  Orientation to time 5  Orientation to Place 5  Registration 3  Attention/ Calculation 5  Recall 3  Language- name 2 objects 2  Language- repeat 1  Language- follow 3 step command 3  Language- read & follow direction 1  Write a sentence 1  Copy design 1  Total score 30    Screening Tests Health Maintenance  Topic Date Due  . PNA vac Low Risk Adult (1 of 2 - PCV13) 04/10/2007  . TETANUS/TDAP  08/09/2014  . ZOSTAVAX  04/06/2016 (Originally 04/09/2002)  . INFLUENZA VACCINE  07/10/2015  . COLONOSCOPY  11/09/2015        Plan:  Schedule eye exam  and have them send a copy of their report to our office.  Keep physical appointment with Dr Laurance Flatten on 04/12/15.   During the course of the visit Anthony Noble was educated and counseled about the following appropriate screening and preventive services:   Vaccines to include Pneumoccal-Prevnar due, Influenza-yearly, Tdap-check cost at next visit, Zostavax-check cost at next visit  Electrocardiogram-last done 2014  Colorectal cancer screening-colonoscopy due this year  Cardiovascular disease screening-Followed by cardiology  Diabetes  screening-glucose checked at eat visit  Glaucoma screening-Schedule eye exam   Nutrition counseling-Balanced diet with lean proteins, fruits, and vegetables  Prostate cancer screening-due at physical on 04/12/15  Exercise-Continue exercise routine  Patient Instructions (the written plan) were given to the patient.   Chong Sicilian, RN   03/29/2015       I have reviewed and agree with the above AWV documentation.  Claretta Fraise, M.D.

## 2015-04-02 ENCOUNTER — Other Ambulatory Visit: Payer: Self-pay | Admitting: Family Medicine

## 2015-04-07 ENCOUNTER — Other Ambulatory Visit (HOSPITAL_COMMUNITY): Payer: Medicare Other

## 2015-04-07 ENCOUNTER — Ambulatory Visit: Payer: Medicare Other | Admitting: Family

## 2015-04-10 ENCOUNTER — Other Ambulatory Visit (INDEPENDENT_AMBULATORY_CARE_PROVIDER_SITE_OTHER): Payer: 59

## 2015-04-10 DIAGNOSIS — Z8551 Personal history of malignant neoplasm of bladder: Secondary | ICD-10-CM

## 2015-04-10 DIAGNOSIS — N400 Enlarged prostate without lower urinary tract symptoms: Secondary | ICD-10-CM

## 2015-04-10 DIAGNOSIS — E031 Congenital hypothyroidism without goiter: Secondary | ICD-10-CM | POA: Diagnosis not present

## 2015-04-10 DIAGNOSIS — Z8669 Personal history of other diseases of the nervous system and sense organs: Secondary | ICD-10-CM | POA: Diagnosis not present

## 2015-04-10 DIAGNOSIS — E559 Vitamin D deficiency, unspecified: Secondary | ICD-10-CM

## 2015-04-10 DIAGNOSIS — Z0000 Encounter for general adult medical examination without abnormal findings: Secondary | ICD-10-CM

## 2015-04-10 DIAGNOSIS — E785 Hyperlipidemia, unspecified: Secondary | ICD-10-CM | POA: Diagnosis not present

## 2015-04-10 LAB — POCT CBC
GRANULOCYTE PERCENT: 80 % (ref 37–80)
HEMATOCRIT: 42.4 % — AB (ref 43.5–53.7)
HEMOGLOBIN: 13.3 g/dL — AB (ref 14.1–18.1)
LYMPH, POC: 1.2 (ref 0.6–3.4)
MCH, POC: 28 pg (ref 27–31.2)
MCHC: 31.3 g/dL — AB (ref 31.8–35.4)
MCV: 89.5 fL (ref 80–97)
MPV: 7.9 fL (ref 0–99.8)
PLATELET COUNT, POC: 200 10*3/uL (ref 142–424)
POC Granulocyte: 6.2 (ref 2–6.9)
POC LYMPH PERCENT: 16.1 %L (ref 10–50)
RBC: 4.74 M/uL (ref 4.69–6.13)
RDW, POC: 15.2 %
WBC: 7.7 10*3/uL (ref 4.6–10.2)

## 2015-04-10 NOTE — Addendum Note (Signed)
Addended by: Zannie Cove on: 04/10/2015 11:11 AM   Modules accepted: Orders

## 2015-04-10 NOTE — Progress Notes (Signed)
Lab only 

## 2015-04-11 DIAGNOSIS — H40033 Anatomical narrow angle, bilateral: Secondary | ICD-10-CM | POA: Diagnosis not present

## 2015-04-11 DIAGNOSIS — H43393 Other vitreous opacities, bilateral: Secondary | ICD-10-CM | POA: Diagnosis not present

## 2015-04-11 LAB — NMR, LIPOPROFILE
CHOLESTEROL: 182 mg/dL (ref 100–199)
HDL CHOLESTEROL BY NMR: 73 mg/dL (ref >39)
HDL PARTICLE NUMBER: 40.9 umol/L (ref >=30.5)
LDL Particle Number: 1148 nmol/L — ABNORMAL HIGH (ref <1000)
LDL SIZE: 20.6 nm (ref >20.5)
LDL-C: 93 mg/dL (ref 0–99)
Small LDL Particle Number: 391 nmol/L (ref <=527)
TRIGLYCERIDES BY NMR: 79 mg/dL (ref 0–149)

## 2015-04-11 LAB — BMP8+EGFR
BUN / CREAT RATIO: 13 (ref 10–22)
BUN: 15 mg/dL (ref 8–27)
CHLORIDE: 100 mmol/L (ref 97–108)
CO2: 23 mmol/L (ref 18–29)
Calcium: 9.4 mg/dL (ref 8.6–10.2)
Creatinine, Ser: 1.16 mg/dL (ref 0.76–1.27)
GFR calc Af Amer: 72 mL/min/{1.73_m2} (ref >59)
GFR calc non Af Amer: 62 mL/min/{1.73_m2} (ref >59)
Glucose: 91 mg/dL (ref 65–99)
Potassium: 4.7 mmol/L (ref 3.5–5.2)
SODIUM: 139 mmol/L (ref 134–144)

## 2015-04-11 LAB — HEPATIC FUNCTION PANEL
ALK PHOS: 62 IU/L (ref 39–117)
ALT: 15 IU/L (ref 0–44)
AST: 25 IU/L (ref 0–40)
Albumin: 4.6 g/dL (ref 3.5–4.8)
BILIRUBIN, DIRECT: 0.11 mg/dL (ref 0.00–0.40)
Bilirubin Total: 0.5 mg/dL (ref 0.0–1.2)
Total Protein: 6.6 g/dL (ref 6.0–8.5)

## 2015-04-11 LAB — VITAMIN D 25 HYDROXY (VIT D DEFICIENCY, FRACTURES): Vit D, 25-Hydroxy: 43.7 ng/mL (ref 30.0–100.0)

## 2015-04-12 ENCOUNTER — Encounter: Payer: Self-pay | Admitting: Family Medicine

## 2015-04-12 ENCOUNTER — Ambulatory Visit (INDEPENDENT_AMBULATORY_CARE_PROVIDER_SITE_OTHER): Payer: 59

## 2015-04-12 ENCOUNTER — Encounter: Payer: Self-pay | Admitting: Family

## 2015-04-12 ENCOUNTER — Ambulatory Visit (INDEPENDENT_AMBULATORY_CARE_PROVIDER_SITE_OTHER): Payer: 59 | Admitting: Family Medicine

## 2015-04-12 VITALS — BP 133/68 | HR 70 | Temp 97.0°F | Ht 67.0 in | Wt 184.0 lb

## 2015-04-12 DIAGNOSIS — Z8551 Personal history of malignant neoplasm of bladder: Secondary | ICD-10-CM

## 2015-04-12 DIAGNOSIS — E785 Hyperlipidemia, unspecified: Secondary | ICD-10-CM | POA: Diagnosis not present

## 2015-04-12 DIAGNOSIS — N400 Enlarged prostate without lower urinary tract symptoms: Secondary | ICD-10-CM

## 2015-04-12 DIAGNOSIS — Z0000 Encounter for general adult medical examination without abnormal findings: Secondary | ICD-10-CM

## 2015-04-12 DIAGNOSIS — Z8669 Personal history of other diseases of the nervous system and sense organs: Secondary | ICD-10-CM

## 2015-04-12 DIAGNOSIS — E031 Congenital hypothyroidism without goiter: Secondary | ICD-10-CM

## 2015-04-12 DIAGNOSIS — M79643 Pain in unspecified hand: Secondary | ICD-10-CM

## 2015-04-12 LAB — POCT UA - MICROSCOPIC ONLY
Bacteria, U Microscopic: NEGATIVE
Casts, Ur, LPF, POC: NEGATIVE
Crystals, Ur, HPF, POC: NEGATIVE
Epithelial cells, urine per micros: NEGATIVE
YEAST UA: NEGATIVE

## 2015-04-12 LAB — POCT URINALYSIS DIPSTICK
BILIRUBIN UA: NEGATIVE
GLUCOSE UA: NEGATIVE
Ketones, UA: NEGATIVE
LEUKOCYTES UA: NEGATIVE
Nitrite, UA: NEGATIVE
PH UA: 6
Protein, UA: NEGATIVE
Spec Grav, UA: 1.02
Urobilinogen, UA: NEGATIVE

## 2015-04-12 NOTE — Patient Instructions (Signed)
The patient should take ibuprofen or Tylenol as needed for aches pains and fever especially in his hands. He should follow-up regularly with his urologist for his bladder cancer He should get his eye exams regularly We will check with infectious disease regarding the administration of tetanus vaccine and Prevnar vaccine in light of the history of  Barr syndrome We will also check on the date for the next colonoscopy We will arrange for you to have an exercise treadmill test this fall hopefully in our office. We will call you with the chest x-ray results as soon as it becomes available Please return the FOBT

## 2015-04-12 NOTE — Progress Notes (Signed)
Subjective:    Patient ID: Anthony Fireman [name], male    DOB: 30-Apr-1941, 74 y.o.   MRN: [id number]  HPI Patient is here today for annual wellness exam and follow up of chronic medical problems which includes hyperlipidemia and hypothyroid. He is taking medications regularly. The patient's only complaints today are bilateral hand pain. The patient's history is significant in that he sees the urologist regularly because of bladder cancer. He also has a history of colon polyps and carotid stenosis.         Patient Active Problem List   Diagnosis Date Noted  . History of bladder cancer 04/06/2014  . History of Guillain-Barre syndrome 04/06/2014  . Personal history of colonic polyps 04/06/2014  . BPH (benign prostatic hyperplasia) 04/06/2014  . Erectile dysfunction 04/06/2014  . Hyperlipidemia 04/06/2014  . Occlusion and stenosis of carotid artery without mention of cerebral infarction 02/01/2014  . Hypothyroidism 03/30/2013  . BCG vaccine causing adverse effect in therapeutic use 02/06/2012  . Epididymitis 01/13/2012  . Carotid stenosis 07/25/2011   Outpatient Encounter Prescriptions as of 04/12/2015  Medication Sig  . aspirin 81 MG EC tablet Take 81 mg by mouth daily.    Marland Kitchen atorvastatin (LIPITOR) 20 MG tablet TAKE 1 TABLET (20 MG TOTAL) BY MOUTH DAILY.  . Calcium Carbonate-Vitamin D (CALCIUM 500/VITAMIN D PO) Take 1 tablet by mouth. 800 IU  . Cholecalciferol (VITAMIN D) 2000 UNITS tablet Take 2,000 Units by mouth daily.    . Coenzyme Q10 (COQ10) 200 MG CAPS Take 1 capsule by mouth daily.  . Fe Fum-FePoly-FA-Vit C-Vit B3 (INTEGRA F) 125-1 MG CAPS Take 1 tablet by mouth daily.  Marland Kitchen levothyroxine (SYNTHROID, LEVOTHROID) 50 MCG tablet Take 50 mcg by mouth daily before breakfast.  . Magnesium 500 MG CAPS Take 1 capsule by mouth daily.  . Multiple Minerals-Vitamins (CALCIUM-MAGNESIUM-ZINC-D3) TABS Take 1 capsule by mouth daily.  . Omega-3 Fatty Acids (FISH OIL) 1000 MG CAPS Take 1  capsule by mouth daily.   No facility-administered encounter medications on file as of 04/12/2015.     Review of Systems  Constitutional: Negative.   HENT: Negative.   Eyes: Negative.   Respiratory: Negative.   Cardiovascular: Negative.   Gastrointestinal: Negative.   Endocrine: Negative.   Genitourinary: Negative.   Musculoskeletal: Arthralgias: bilateral hand pain.  Skin: Negative.   Allergic/Immunologic: Negative.   Neurological: Negative.   Hematological: Negative.   Psychiatric/Behavioral: Negative.        Objective:   Physical Exam  Constitutional: He is oriented to person, place, and time. He appears well-developed and well-nourished. No distress.  HENT:  Head: Normocephalic and atraumatic.  Right Ear: External ear normal.  Left Ear: External ear normal.  Nose: Nose normal.  Mouth/Throat: Oropharynx is clear and moist. No oropharyngeal exudate.  Eyes: Conjunctivae and EOM are normal. Pupils are equal, round, and reactive to light. Right eye exhibits no discharge. Left eye exhibits no discharge. No scleral icterus.  Neck: Normal range of motion. Neck supple. No thyromegaly present.  It is a left carotid bruit and patient is due to get carotid Dopplers tomorrow.  Cardiovascular: Normal rate, regular rhythm, normal heart sounds and intact distal pulses.   No murmur heard. The patient has a grade 2/6 systolic ejection murmur  Pulmonary/Chest: Effort normal and breath sounds normal. No respiratory distress. He has no wheezes. He has no rales. He exhibits no tenderness.  Lungs were clear anteriorly and posteriorly and there is no axillary adenopathy  Abdominal: Soft. Bowel  sounds are normal. He exhibits no mass. There is no tenderness. There is no rebound and no guarding.  No abdominal bruits, no masses or organ enlargement  Genitourinary: Rectum normal and penis normal.  The prostate is slightly enlarged but soft and smooth. There are no rectal masses. There are no inguinal  hernias or inguinal nodes and the external genitalia were normal.  Musculoskeletal: Normal range of motion. He exhibits no edema or tenderness.  Lymphadenopathy:    He has no cervical adenopathy.  Neurological: He is alert and oriented to person, place, and time. He has normal reflexes. No cranial nerve deficit.  Skin: Skin is warm and dry. No rash noted. No erythema. No pallor.  Psychiatric: He has a normal mood and affect. His behavior is normal. Judgment and thought content normal.  Nursing note and vitals reviewed.  BP 133/68 mmHg  Pulse 70  Temp(Src) 97 F (36.1 C) (Oral)  Ht 5\' 7"  (1.702 m)  Wt 184 lb (83.462 kg)  BMI 28.81 kg/m2  [facility] reading (PRIMARY) by  Dr. Laurance Flatten- CXR   --no active disease                                    Assessment & Plan:  1. 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 The patient should continue with his current treatment.  2. History of primary bladder cancer -Continue follow-up with urology  3. Hyperlipidemia -Continue current treatment but try to do better with therapeutic lifestyle changes and diet - DG Chest 2 View; Future  4. History of Guillain-Barre syndrome -No problems with this at the present time. The patient is concerned about any further vaccinations because of the possibility of this flaring up this syndrome. We will discuss these issues with infectious disease and make decisions regarding the Prevnar vaccine and his tetanus vaccine - DG Chest 2 View; Future  5. BPH (benign prostatic hyperplasia) -He is having no specific issues with voiding but he still sees the urologist regularly because of his latter cancer - POCT UA - Microscopic Only - POCT urinalysis dipstick  6. Annual physical exam -Please return the FOBT, we will check into the need for a colonoscopy, arrange a stress test this summer, and look into future vaccinations. - POCT UA - Microscopic Only - POCT urinalysis dipstick - DG Chest 2  View; Future  7. Pain of hand, unspecified laterality -Take Tylenol or ibuprofen on an as-needed basis  Patient Instructions  The patient should take ibuprofen or Tylenol as needed for aches pains and fever especially in his hands. He should follow-up regularly with his urologist for his bladder cancer He should get his eye exams regularly We will check with infectious disease regarding the administration of tetanus vaccine and Prevnar vaccine in light of the history of  Barr syndrome We will also check on the date for the next colonoscopy We will arrange for you to have an exercise treadmill test this fall hopefully in our office. We will call you with the chest x-ray results as soon as it becomes available Please return the FOBT    Arrie Senate MD

## 2015-04-13 ENCOUNTER — Ambulatory Visit (INDEPENDENT_AMBULATORY_CARE_PROVIDER_SITE_OTHER): Payer: 59 | Admitting: Family

## 2015-04-13 ENCOUNTER — Encounter: Payer: Self-pay | Admitting: Family

## 2015-04-13 ENCOUNTER — Ambulatory Visit (HOSPITAL_COMMUNITY)
Admission: RE | Admit: 2015-04-13 | Discharge: 2015-04-13 | Disposition: A | Discharge status: Home / Self Care | Payer: 59 | Source: Ambulatory Visit | Attending: Family | Admitting: Family

## 2015-04-13 ENCOUNTER — Other Ambulatory Visit: Payer: Self-pay | Admitting: Vascular Surgery

## 2015-04-13 VITALS — BP 143/76 | HR 64 | Resp 16 | Ht 69.0 in | Wt 186.0 lb

## 2015-04-13 DIAGNOSIS — I6523 Occlusion and stenosis of bilateral carotid arteries: Secondary | ICD-10-CM | POA: Diagnosis not present

## 2015-04-13 DIAGNOSIS — Z87891 Personal history of nicotine dependence: Secondary | ICD-10-CM | POA: Diagnosis not present

## 2015-04-13 LAB — VAS US CAROTID
LEFT BRACHIAL SYS: 142 cm/s
LEFT CCA MID DIAS: 19 R/I
LEFT CCA MID SYS: 91 cm/s
LEFT ECA DIAS: 30 cm/s
LEFT ICA/CCA RATIO: 0.97 cm/s
LEFT VERTEBRAL DIAS: 8 cm/s
LICAMSYS: 79 cm/s
LVERTSYS: 38 cm/s
Left CCA dist dias: 20 cm/s
Left CCA dist sys: 94 cm/s
Left CCA prox dias: 17 cm/s
Left CCA prox sys: 92 cm/s
Left ECA sys: 275 cm/s
Left ICA dist dias: 21 cm/s
Left ICA dist sys: 71 cm/s
Left ICA mid dias: 22 cm/s
Left ICA prox dias: 24 cm/s
Left ICA prox sys: 89 cm/s
RCCADDIAS: 19 cm/s
RCCAPSYS: 90 cm/s
RECASYS: 326 cm/s
RICADSYS: 77 cm/s
RICAPDIAS: 34 cm/s
RIGHT BRACHIAL SYS: 148 cm/s
RIGHT CCA MID DIAS: 22 cm/s
RIGHT CCA MID SYS: 107 cm/s
RIGHT ECA DIAS: 24 cm/s
RIGHT VERTEBRAL DIAS: 12 cm/s
RVERTSYS: 43 cm/s
Right CCA prox dias: 18 cm/s
Right ICA dist dias: 23 cm/s
Right ICA mid dias: 31 cm/s
Right ICA mid sys: 120 cm/s
Right ICA prox sys: 130 cm/s
Right cca dist sys: 101 cm/s

## 2015-04-13 LAB — URINE CULTURE

## 2015-04-13 NOTE — Patient Instructions (Signed)
Stroke Prevention Some medical conditions and behaviors are associated with an increased chance of having a stroke. You may prevent a stroke by making healthy choices and managing medical conditions. HOW CAN I REDUCE MY RISK OF HAVING A STROKE?   Stay physically active. Get at least 30 minutes of activity on most or all days.  Do not smoke. It may also be helpful to avoid exposure to secondhand smoke.  Limit alcohol use. Moderate alcohol use is considered to be:  No more than 2 drinks per day for men.  No more than 1 drink per day for nonpregnant women.  Eat healthy foods. This involves:  Eating 5 or more servings of fruits and vegetables a day.  Making dietary changes that address high blood pressure (hypertension), high cholesterol, diabetes, or obesity.  Manage your cholesterol levels.  Making food choices that are high in fiber and low in saturated fat, trans fat, and cholesterol may control cholesterol levels.  Take any prescribed medicines to control cholesterol as directed by your health care provider.  Manage your diabetes.  Controlling your carbohydrate and sugar intake is recommended to manage diabetes.  Take any prescribed medicines to control diabetes as directed by your health care provider.  Control your hypertension.  Making food choices that are low in salt (sodium), saturated fat, trans fat, and cholesterol is recommended to manage hypertension.  Take any prescribed medicines to control hypertension as directed by your health care provider.  Maintain a healthy weight.  Reducing calorie intake and making food choices that are low in sodium, saturated fat, trans fat, and cholesterol are recommended to manage weight.  Stop drug abuse.  Avoid taking birth control pills.  Talk to your health care provider about the risks of taking birth control pills if you are over 35 years old, smoke, get migraines, or have ever had a blood clot.  Get evaluated for sleep  disorders (sleep apnea).  Talk to your health care provider about getting a sleep evaluation if you snore a lot or have excessive sleepiness.  Take medicines only as directed by your health care provider.  For some people, aspirin or blood thinners (anticoagulants) are helpful in reducing the risk of forming abnormal blood clots that can lead to stroke. If you have the irregular heart rhythm of atrial fibrillation, you should be on a blood thinner unless there is a good reason you cannot take them.  Understand all your medicine instructions.  Make sure that other conditions (such as anemia or atherosclerosis) are addressed. SEEK IMMEDIATE MEDICAL CARE IF:   You have sudden weakness or numbness of the face, arm, or leg, especially on one side of the body.  Your face or eyelid droops to one side.  You have sudden confusion.  You have trouble speaking (aphasia) or understanding.  You have sudden trouble seeing in one or both eyes.  You have sudden trouble walking.  You have dizziness.  You have a loss of balance or coordination.  You have a sudden, severe headache with no known cause.  You have new chest pain or an irregular heartbeat. Any of these symptoms may represent a serious problem that is an emergency. Do not wait to see if the symptoms will go away. Get medical help at once. Call your local emergency services (911 in [place] Do not drive yourself to the hospital. Document Released: 01/02/2005 Document Revised: 04/11/2014 Document Reviewed: 05/28/2013 [employer] Patient Information 2015 [employer]. This information is not intended to replace advice given   to you by your health care provider. Make sure you discuss any questions you have with your health care provider.  

## 2015-04-13 NOTE — Progress Notes (Signed)
Filed Vitals:   04/13/15 1122 04/13/15 1125 04/13/15 1132 04/13/15 1135  BP: 151/80 146/80 147/80 143/76  Pulse: 62 61 66 64  Resp:  16    Height:  5\' 9"  (1.753 m)    Weight:  186 lb (84.369 kg)    SpO2:  100%

## 2015-04-13 NOTE — Progress Notes (Signed)
Established Carotid Patient   History of Present Illness  Anthony Noble. is a 74 y.o. male patient of Dr. Oneida Alar with known mild bilateral internal carotid artery stenosis. He returns today for routine surveillance.  Patient has not had previous carotid artery intervention. Works out 5 days/week for an hour, and golfs 3 days /week. He denies claudication symptoms.  Patient has a negative history of TIA or stroke symptoms; he denies amaurosis fugax or monocular blindness, denies facial drooping, denies hemiplegia, denies receptive or expressive aphasia.   Patient denies New Medical or Surgical History:    Pt Diabetic: No Pt smoker: former smoker, quit in 1983  Pt meds include: Statin : yes, atrovastatin ASA: Yes, 81 mg every other day, bruises easily Other anticoagulants/antiplatelets: no    Past Medical History  Diagnosis Date  . Varicose veins   . Hyperlipidemia   . Leg cramps   . History of bladder cancer     first dx 2004--  tx with chemo bladder instillation  . Bilateral carotid artery stenosis without cerebral infarction     Asymptomatic---bilateral ICA <40%  and mild stenosis bilateral ECA  . History of colon polyps   . Diverticulosis of colon   . History of pleural effusion     2001  . Guillain-Barre syndrome     DX 2001  . BPH (benign prostatic hypertrophy) with urinary obstruction   . History of vertebral fracture     2001--   T12  . Nocturia   . Wears glasses   . Atypical rash     winter rash  . Guillain-Barre 2001  . Pneumonia 2001  . Heart murmur March /  April 2016    Social History History  Substance Use Topics  . Smoking status: Former Smoker -- 1.00 packs/day for 20 years    Types: Cigarettes    Quit date: 12/09/1981  . Smokeless tobacco: Never Used  . Alcohol Use: Yes     Comment: occasional wine    Family History Family History  Problem Relation Age of Onset  . Other Mother     varicose veins  . Cancer Mother      Ovarian  . Varicose Veins Mother   . COPD Father   . Hyperlipidemia Father   . Cancer Maternal Grandmother   . Bipolar disorder Daughter     Surgical History Past Surgical History  Procedure Laterality Date  . Tonsillectomy  as child  . Video assisted thoracoscopy (vats)/empyema Right 05-01-2000    and Drainage of effusion  . Orif right distal radius fx  12-31-2007  . Transurethral resection of bladder tumor with mitomycin-c  06-07-2003  . Cysto/  bilateral retrograde pylogram/  resection bladder tumor  07-30-2011  . Negative sleep study  YRS AGO per pt  . Cystoscopy with biopsy N/A 01/12/2015    Procedure: CYSTOSCOPY WITH BLADDER  BIOPSY, FULGERATION;  Surgeon: Malka So, MD;  Location[facility];  Service: Urology;  Laterality: N/A;    Allergies  Allergen Reactions  . Bcg Live Other (See Comments)    Treatment pt had- shakes  . Pertussis Vaccines     History of Gillian barrie   . Septra [Bactrim] Other (See Comments)    Muscle weakness, numbness  . Sulfa Antibiotics Other (See Comments)    Severe weakness    Current Outpatient Prescriptions  Medication Sig Dispense Refill  . aspirin 81 MG EC tablet Take 81 mg by mouth every other day.     Marland Kitchen  atorvastatin (LIPITOR) 20 MG tablet TAKE 1 TABLET (20 MG TOTAL) BY MOUTH DAILY. 90 tablet 0  . Calcium Carbonate-Vitamin D (CALCIUM 500/VITAMIN D PO) Take 1 tablet by mouth. 800 IU    . Cholecalciferol (VITAMIN D) 2000 UNITS tablet Take 2,000 Units by mouth daily.      . Coenzyme Q10 (COQ10) 200 MG CAPS Take 1 capsule by mouth daily.    . Fe Fum-FePoly-FA-Vit C-Vit B3 (INTEGRA F) 125-1 MG CAPS Take 1 tablet by mouth daily. 30 capsule 1  . levothyroxine (SYNTHROID, LEVOTHROID) 50 MCG tablet Take 50 mcg by mouth daily before breakfast.    . Magnesium 500 MG CAPS Take 1 capsule by mouth daily.    . Multiple Minerals-Vitamins (CALCIUM-MAGNESIUM-ZINC-D3) TABS Take 1 capsule by mouth daily.    . Omega-3 Fatty Acids (FISH  OIL) 1000 MG CAPS Take 1 capsule by mouth daily.     No current facility-administered medications for this visit.    Review of Systems : See HPI for pertinent positives and negatives.  Physical Examination  Filed Vitals:   04/13/15 1122 04/13/15 1125 04/13/15 1132 04/13/15 1135  BP: 151/80 146/80 147/80 143/76  Pulse: 62 61 66 64  Resp:  16    Height:  5\' 9"  (1.753 m)    Weight:  186 lb (84.369 kg)    SpO2:  100%     Body mass index is 27.45 kg/(m^2).  General: WDWN male in NAD GAIT: normal Eyes: PERRLA Pulmonary: Non-labored, CTAB, Negative Rales, Negative rhonchi, & Negative wheezing.  Cardiac: regular Rhythm, + murmur.  VASCULAR EXAM Carotid Bruits Left Right   Negative Negative   aorta is not palpable Radial pulses are 3+ palpable and equal.      LE Pulses LEFT RIGHT   POPLITEAL not palpable  not palpable   POSTERIOR TIBIAL  palpable   palpable    DORSALIS PEDIS  ANTERIOR TIBIAL palpable  palpable     Gastrointestinal: soft, nontender, BS WNL, no r/g, no palpable masses.  Musculoskeletal: Negative muscle atrophy/wasting. M/S 5/5 throughout, Extremities without ischemic changes.  Neurologic: A&O X 3; Appropriate Affect ; SENSATION ;normal;  Speech is normal CN 2-12 intact, Pain and light touch intact in extremities, Motor exam as listed above.         Non-Invasive Vascular Imaging CAROTID DUPLEX 04/13/2015   CEREBROVASCULAR DUPLEX EVALUATION    INDICATION: Carotid stenosis    PREVIOUS INTERVENTION(S): NA    DUPLEX EXAM:     RIGHT  LEFT  Peak Systolic Velocities (cm/s) End Diastolic Velocities (cm/s) Plaque LOCATION Peak Systolic Velocities (cm/s) End Diastolic Velocities (cm/s) Plaque  90 18  CCA PROXIMAL 92 17   107 22  CCA MID 91 19   101 19 HT CCA DISTAL 94 20 HT   326 24 HT ECA 275 30 HT  130 30 HT ICA PROXIMAL 89 24 HT  120 31  ICA MID 78 23   77 23  ICA DISTAL 71 21     1.21 ICA / CCA Ratio (PSV) 0.97  Antegrade Vertebral Flow Antegrade  245 Brachial Systolic Pressure (mmHg) 809  Triphasic Brachial Artery Waveforms Triphasic    Plaque Morphology:  HM = Homogeneous, HT = Heterogeneous, CP = Calcific Plaque, SP = Smooth Plaque, IP = Irregular Plaque  ADDITIONAL FINDINGS:     IMPRESSION: Bilateral internal carotid artery stenosis present in the 1%-49% range. Bilateral external carotid artery stenosis present in the greater than 50% range.    Compared to the previous  exam:  Essentially unchanged since previous study on 02/01/2014.      Assessment: Anthony Fireman Pruitt Taboada. is a 74 y.o. male who has been monitored for mild bilateral internal carotid artery stenosis. He has no history of stroke or TIA.  Today's carotid Duplex suggests bilateral internal carotid artery stenosis present in the 1%-49% range and bilateral external carotid artery stenosis present in the greater than 50% range. Essentially unchanged since previous study on 02/01/2014 and as far back as August of 2012 (review of records).   Plan: Follow-up in 18 months with Carotid Duplex.   I discussed in depth with the patient the nature of atherosclerosis, and emphasized the importance of maximal medical management including strict control of blood pressure, blood glucose, and lipid levels, obtaining regular exercise, and continued cessation of smoking.  The patient is aware that without maximal medical management the underlying atherosclerotic disease process will progress, limiting the benefit of any interventions. The patient was given information about stroke prevention and what symptoms should prompt the patient to seek immediate medical care. Thank you for allowing [place] to participate in this patient's care.  Clemon Chambers, RN, MSN, FNP-C [facility] Office: [facility] Physician: Oneida Alar  04/13/2015 11:56 AM

## 2015-04-14 LAB — PSA, TOTAL AND FREE
PSA, Free Pct: 19.3 %
PSA, Free: 0.27 ng/mL
Prostate Specific Ag, Serum: 1.4 ng/mL (ref 0.0–4.0)

## 2015-04-14 LAB — SPECIMEN STATUS REPORT

## 2015-04-18 ENCOUNTER — Telehealth: Payer: Self-pay | Admitting: *Deleted

## 2015-04-18 NOTE — Telephone Encounter (Signed)
-----   Message from Chipper Herb, MD sent at 04/17/2015  4:53 PM EDT ----- Because the rate of rise of the PSA is 0.8, repeat the PSA again in 4 weeks

## 2015-04-19 ENCOUNTER — Other Ambulatory Visit: Payer: Self-pay | Admitting: Family Medicine

## 2015-04-20 ENCOUNTER — Telehealth: Payer: Self-pay | Admitting: Family Medicine

## 2015-04-20 NOTE — Telephone Encounter (Signed)
Patient aware of results.

## 2015-05-17 ENCOUNTER — Institutional Professional Consult (permissible substitution): Payer: Medicare Other | Admitting: Cardiology

## 2015-06-28 ENCOUNTER — Encounter: Payer: Self-pay | Admitting: Cardiology

## 2015-06-28 ENCOUNTER — Ambulatory Visit (INDEPENDENT_AMBULATORY_CARE_PROVIDER_SITE_OTHER): Payer: Medicare Other | Admitting: Cardiology

## 2015-06-28 VITALS — BP 130/80 | HR 63 | Ht 69.0 in | Wt 187.0 lb

## 2015-06-28 DIAGNOSIS — I351 Nonrheumatic aortic (valve) insufficiency: Secondary | ICD-10-CM | POA: Insufficient documentation

## 2015-06-28 DIAGNOSIS — R011 Cardiac murmur, unspecified: Secondary | ICD-10-CM | POA: Diagnosis not present

## 2015-06-28 NOTE — Patient Instructions (Signed)
Medication Instructions:  Your physician recommends that you continue on your current medications as directed. Please refer to the Current Medication list given to you today.   Follow-Up: As needed  Thank you for choosing [facility]!!

## 2015-06-28 NOTE — Progress Notes (Signed)
Cardiology Office Note   Date:  06/28/2015   ID:  Anthony Fireman [name], DOB 04-04-1941, MRN [id number]  PCP:  Redge Gainer, MD  Cardiologist:   Minus Breeding, MD   No chief complaint on file.     History of Present Illness: Anthony Riling. is a 74 y.o. male who presents for evaluation of a heart murmur.   He has no prior cardiac history other than the stress test years ago. He was noted recently to have a heart murmur. He's never been told this before. He is an active gentleman. He exercises on his elliptical 9000 right daily and walks the golf course. With this he denies any cardiovascular symptoms. The patient denies any new symptoms such as chest discomfort, neck or arm discomfort. There has been no new shortness of breath, PND or orthopnea. There have been no reported palpitations, presyncope or syncope.   Past Medical History  Diagnosis Date  . Varicose veins   . Hyperlipidemia   . History of bladder cancer     first dx 2004--  tx with chemo bladder instillation  . Bilateral carotid artery stenosis without cerebral infarction     Asymptomatic---bilateral ICA <40%  and mild stenosis bilateral ECA  . History of colon polyps   . Diverticulosis of colon   . History of pleural effusion     2001  . Guillain-Barre syndrome     DX 2001  . BPH (benign prostatic hypertrophy) with urinary obstruction   . History of vertebral fracture     2001--   T12  . Pneumonia 2001  . Heart murmur March /  April 2016    Past Surgical History  Procedure Laterality Date  . Tonsillectomy  as child  . Video assisted thoracoscopy (vats)/empyema Right 05-01-2000    and Drainage of effusion  . Orif right distal radius fx  12-31-2007  . Transurethral resection of bladder tumor with mitomycin-c  06-07-2003  . Cysto/  bilateral retrograde pylogram/  resection bladder tumor  07-30-2011  . Negative sleep study  YRS AGO per pt  . Cystoscopy with biopsy N/A 01/12/2015    Procedure:  CYSTOSCOPY WITH BLADDER  BIOPSY, FULGERATION;  Surgeon: Malka So, MD;  Location: [facility];  Service: Urology;  Laterality: N/A;     Current Outpatient Prescriptions  Medication Sig Dispense Refill  . aspirin 81 MG EC tablet Take 81 mg by mouth every other day.     Marland Kitchen atorvastatin (LIPITOR) 20 MG tablet TAKE 1 TABLET (20 MG TOTAL) BY MOUTH DAILY. 90 tablet 0  . BIOTIN 5000 PO Take 5,000 mg by mouth daily.    . Cholecalciferol (VITAMIN D) 2000 UNITS tablet Take 2,000 Units by mouth daily.      . Coenzyme Q10 (COQ10) 200 MG CAPS Take 1 capsule by mouth daily.    Marland Kitchen CRANBERRY PO Take 4,200 mg by mouth daily.    . Fe Fum-FePoly-FA-Vit C-Vit B3 (FOLIVANE-F) 125-1 MG CAPS TAKE ONE CAPSULE BY MOUTH ONE TIME DAILY 30 capsule 5  . levothyroxine (SYNTHROID, LEVOTHROID) 50 MCG tablet Take 50 mcg by mouth daily before breakfast.    . Magnesium 500 MG CAPS Take 1 capsule by mouth daily.    . Omega-3 Fatty Acids (FISH OIL) 1000 MG CAPS Take 1 capsule by mouth daily.    . Probiotic Product (PROBIOTIC ACIDOPHILUS) CAPS Take by mouth daily.     No current facility-administered medications for this visit.    Allergies:  Bcg live; Pertussis vaccines; Septra; and Sulfa antibiotics    Social History:  The patient  reports that he quit smoking about 33 years ago. His smoking use included Cigarettes. He has a 20 pack-year smoking history. He has never used smokeless tobacco. He reports that he drinks alcohol. He reports that he does not use illicit drugs.   Family History:  The patient's family history includes Bipolar disorder in his daughter; COPD in his father; Cancer in his maternal grandmother and mother; Hyperlipidemia in his father; Other in his mother; Varicose Veins in his mother.    ROS:  Please see the history of present illness.   Otherwise, review of systems are positive for leg cramps.   All other systems are reviewed and negative.    PHYSICAL EXAM: VS:  BP 130/80 mmHg   Pulse 63  Ht 5\' 9"  (1.753 m)  Wt 187 lb (84.823 kg)  BMI 27.60 kg/m2 , BMI Body mass index is 27.6 kg/(m^2). GENERAL:  Well appearing HEENT:  Pupils equal round and reactive, fundi not visualized, oral mucosa unremarkable NECK:  No jugular venous distention, waveform within normal limits, carotid upstroke brisk and symmetric, no bruits, no thyromegaly LYMPHATICS:  No cervical, inguinal adenopathy LUNGS:  Clear to auscultation bilaterally BACK:  No CVA tenderness CHEST:  Unremarkable HEART:  PMI not displaced or sustained,S1 and S2 within normal limits, no S3, no S4, no clicks, no rubs, 2 out of 6 apical systolic murmur heard best at the right upper sternal border brief and radiating slightly to the carotids, no diastolic murmurs ABD:  Flat, positive bowel sounds normal in frequency in pitch, no bruits, no rebound, no guarding, no midline pulsatile mass, no hepatomegaly, no splenomegaly EXT:  2 plus pulses throughout, no edema, no cyanosis no clubbing SKIN:  No rashes no nodules NEURO:  Cranial nerves II through XII grossly intact, motor grossly intact throughout PSYCH:  Cognitively intact, oriented to person place and time    EKG:  EKG is ordered today. The ekg ordered today demonstrates sinus rhythm, rate 63, axis within normal limits, intervals within normal limits, no acute ST-T wave changes.   Recent Labs: 02/23/2015: Magnesium 2.2; Platelets 348 04/10/2015: ALT 15; BUN 15; Creatinine, Ser 1.16; Hemoglobin 13.3*; Potassium 4.7; Sodium 139    Lipid Panel    Component Value Date/Time   CHOL 182 04/10/2015 1121   CHOL 235* 03/31/2014 0805   TRIG 79 04/10/2015 1121   TRIG 84 03/31/2014 0805   HDL 73 04/10/2015 1121   HDL 68 03/31/2014 0805   HDL 64 03/24/2013 0832   CHOLHDL 3.5 03/31/2014 0805   CHOLHDL 3.7 03/24/2013 0832   VLDL 16 03/24/2013 0832   LDLCALC 150* 03/31/2014 0805   LDLCALC 158* 03/24/2013 0832      Wt Readings from Last 3 Encounters:  06/28/15 187 lb  (84.823 kg)  04/13/15 186 lb (84.369 kg)  04/12/15 184 lb (83.462 kg)      Other studies Reviewed: Additional studies/ records that were reviewed today include: None. Review of the above records demonstrates:  Please see elsewhere in the note.     ASSESSMENT AND PLAN:  MURMUR:  He has a very systolic murmur representing aortic sclerosis with very mild stenosis. He has no other symptoms and no other physical findings. No further cardiovascular testing is suggested. This can be followed clinically with physical exam alone.  CAROTID STENOSIS:  He has very mild plaque. No change in therapy is indicated.   Current medicines are  reviewed at length with the patient today.  The patient does not have concerns regarding medicines.  The following changes have been made:  no change  Labs/ tests ordered today include: None  No orders of the defined types were placed in this encounter.     Disposition:   FU with me as needed.     Signed, Minus Breeding, MD  06/28/2015 3:26 PM    [place]

## 2015-07-27 ENCOUNTER — Other Ambulatory Visit: Payer: Self-pay | Admitting: Family Medicine

## 2015-08-21 DIAGNOSIS — Z86008 Personal history of in-situ neoplasm of other site: Secondary | ICD-10-CM | POA: Diagnosis not present

## 2015-10-20 ENCOUNTER — Telehealth: Payer: Self-pay

## 2015-10-20 MED ORDER — HYOSCYAMINE SULFATE 0.125 MG SL SUBL
0.1250 mg | SUBLINGUAL_TABLET | Freq: Three times a day (TID) | SUBLINGUAL | Status: DC | PRN
Start: 2015-10-20 — End: 2016-05-03

## 2015-10-20 NOTE — Telephone Encounter (Signed)
Anthony Noble wife saw Dr. Warrick Parisian today and wanted to let you know her husband has been taking her Levsin recently.  She would like for you to send him in a prescription for this to [facility].

## 2015-10-20 NOTE — Telephone Encounter (Signed)
Prescription sent in for Levsin SL 0.125 per Dr. Laurance Flatten

## 2015-10-20 NOTE — Telephone Encounter (Signed)
It is okay to call in a prescription for levsin for this patient

## 2015-10-23 ENCOUNTER — Other Ambulatory Visit: Payer: Self-pay | Admitting: Family Medicine

## 2015-11-14 ENCOUNTER — Telehealth: Payer: Self-pay | Admitting: Family Medicine

## 2015-11-15 ENCOUNTER — Other Ambulatory Visit: Payer: Self-pay | Admitting: *Deleted

## 2015-11-15 DIAGNOSIS — Z1211 Encounter for screening for malignant neoplasm of colon: Secondary | ICD-10-CM

## 2015-11-15 NOTE — Telephone Encounter (Signed)
Pt has a kit at home

## 2015-11-16 ENCOUNTER — Other Ambulatory Visit: Payer: Self-pay | Admitting: Family Medicine

## 2015-11-21 ENCOUNTER — Other Ambulatory Visit: Payer: 59

## 2015-11-21 DIAGNOSIS — Z1211 Encounter for screening for malignant neoplasm of colon: Secondary | ICD-10-CM

## 2015-11-21 DIAGNOSIS — Z1212 Encounter for screening for malignant neoplasm of rectum: Secondary | ICD-10-CM

## 2015-11-21 NOTE — Progress Notes (Signed)
Lab only 

## 2015-11-24 LAB — FECAL OCCULT BLOOD, IMMUNOCHEMICAL: FECAL OCCULT BLD: NEGATIVE

## 2015-11-26 ENCOUNTER — Other Ambulatory Visit: Payer: Self-pay | Admitting: Family Medicine

## 2015-11-28 NOTE — Telephone Encounter (Signed)
This is okay to refill 1. Please remind the patient that it has been over 6 months since he had his liver function and cholesterol checked and these need to be done soon.

## 2015-11-28 NOTE — Telephone Encounter (Signed)
Last seen 04/12/15  DWM  Last lipid 04/10/15

## 2015-11-28 NOTE — Telephone Encounter (Signed)
Last seen 04/12/15  Last lipid 04/10/15

## 2015-12-01 ENCOUNTER — Encounter: Payer: Self-pay | Admitting: Gastroenterology

## 2015-12-28 ENCOUNTER — Other Ambulatory Visit: Payer: Self-pay | Admitting: Family Medicine

## 2016-01-15 ENCOUNTER — Encounter: Payer: Self-pay | Admitting: Gastroenterology

## 2016-01-23 ENCOUNTER — Other Ambulatory Visit: Payer: Self-pay

## 2016-01-23 MED ORDER — ATORVASTATIN CALCIUM 20 MG PO TABS: 20.0000 mg | ORAL_TABLET | Freq: Every day | ORAL | Status: DC

## 2016-01-23 MED ORDER — LEVOTHYROXINE SODIUM 50 MCG PO TABS: 50.0000 ug | ORAL_TABLET | Freq: Every day | ORAL | Status: DC

## 2016-01-23 NOTE — Telephone Encounter (Signed)
Is okay to refill these medications 1,  but patient needs to come in and get lab work and especially a thyroid profile as well as a lipid liver panel

## 2016-01-23 NOTE — Telephone Encounter (Signed)
Last seen 04/12/15  DWM  Last thyroid 3/17/216  This is for mail order

## 2016-02-01 ENCOUNTER — Other Ambulatory Visit: Payer: Self-pay | Admitting: Family Medicine

## 2016-02-01 DIAGNOSIS — Z86008 Personal history of in-situ neoplasm of other site: Secondary | ICD-10-CM | POA: Diagnosis not present

## 2016-02-29 ENCOUNTER — Other Ambulatory Visit: Payer: Self-pay | Admitting: Family Medicine

## 2016-02-29 ENCOUNTER — Ambulatory Visit (AMBULATORY_SURGERY_CENTER): Payer: Self-pay | Admitting: *Deleted

## 2016-02-29 VITALS — Ht 69.0 in | Wt 184.6 lb

## 2016-02-29 DIAGNOSIS — Z86010 Personal history of colonic polyps: Secondary | ICD-10-CM

## 2016-02-29 MED ORDER — NA SULFATE-K SULFATE-MG SULF 17.5-3.13-1.6 GM/177ML PO SOLN: ORAL | Status: DC

## 2016-02-29 NOTE — Progress Notes (Signed)
No allergies to eggs or soy. No problems with anesthesia.  Pt given Emmi instructions for colonoscopy  No oxygen use  No diet drug use  

## 2016-03-01 ENCOUNTER — Encounter: Payer: Self-pay | Admitting: Gastroenterology

## 2016-03-14 ENCOUNTER — Encounter: Payer: Self-pay | Admitting: Gastroenterology

## 2016-03-14 ENCOUNTER — Ambulatory Visit (AMBULATORY_SURGERY_CENTER): Payer: 59 | Admitting: Gastroenterology

## 2016-03-14 VITALS — BP 113/62 | HR 58 | Temp 98.7°F | Resp 11 | Ht 69.0 in | Wt 184.0 lb

## 2016-03-14 DIAGNOSIS — Z1211 Encounter for screening for malignant neoplasm of colon: Secondary | ICD-10-CM

## 2016-03-14 MED ORDER — SODIUM CHLORIDE 0.9 % IV SOLN: 500.0000 mL | INTRAVENOUS | Status: DC

## 2016-03-14 NOTE — Progress Notes (Signed)
A/ox3 pleased with MAC, report to Jane RN 

## 2016-03-14 NOTE — Op Note (Signed)
[facility] Patient Name: Anthony Noble Procedure Date: 03/14/2016 9:37 AM MRN: [id number] Endoscopist: Ladene Artist , MD Age: 75 Date of Birth: 1941-06-02 Gender: Male Procedure:                Colonoscopy Indications:              Screening for colorectal malignant neoplasm Medicines:                Monitored Anesthesia Care Procedure:                Pre-Anesthesia Assessment:                           - Prior to the procedure, a History and Physical                            was performed, and patient medications and                            allergies were reviewed. The patient's tolerance of                            previous anesthesia was also reviewed. The risks                            and benefits of the procedure and the sedation                            options and risks were discussed with the patient.                            All questions were answered, and informed consent                            was obtained. Prior Anticoagulants: The patient has                            taken no previous anticoagulant or antiplatelet                            agents. ASA Grade Assessment: II - A patient with                            mild systemic disease. After reviewing the risks                            and benefits, the patient was deemed in                            satisfactory condition to undergo the procedure.                           After obtaining informed consent, the colonoscope  was passed under direct vision. Throughout the                            procedure, the patient's blood pressure, pulse, and                            oxygen saturations were monitored continuously. The                            Model PCF-H190DL [id number]) scope was introduced                            through the anus and advanced to the the cecum,                            identified by appendiceal orifice and ileocecal                  valve. The colonoscopy was performed without                            difficulty. The patient tolerated the procedure                            well. The quality of the bowel preparation was                            adequate to identify polyps 6 mm and larger in                            size. The ileocecal valve, appendiceal orifice, and                            rectum were photographed. Scope In: 10:24:24 AM Scope Out: 10:38:46 AM Scope Withdrawal Time: 0 hours 12 minutes 12 seconds  Total Procedure Duration: 0 hours 14 minutes 22 seconds  Findings:                 The digital rectal exam was normal.                           Multiple diverticula were found in the sigmoid                            colon and descending colon.                           The exam was otherwise normal throughout the                            examined colon.                           Internal hemorrhoids were found during  retroflexion. The hemorrhoids were small and Grade                            I (internal hemorrhoids that do not prolapse).                           No additional abnormalities were found on                            retroflexion. Complications:            No immediate complications. Estimated Blood Loss:     Estimated blood loss: none. Impression:               - Moderate diverticulosis in the sigmoid colon and                            in the descending colon.                           - Internal hemorrhoids. Recommendation:           - Patient has a contact number available for                            emergencies. The signs and symptoms of potential                            delayed complications were discussed with the                            patient. Return to normal activities tomorrow.                            Written discharge instructions were provided to the                            patient.                            - Resume previous diet.                           - Continue present medications.                           - No repeat colonoscopy due to age. Ladene Artist, MD 03/14/2016 10:51:21 AM This report has been signed electronically.

## 2016-03-14 NOTE — Patient Instructions (Signed)
  YOU HAD AN ENDOSCOPIC PROCEDURE TODAY AT [facility]:   Refer to the procedure report that was given to you for any specific questions about what was found during the examination.  If the procedure report does not answer your questions, please call your gastroenterologist to clarify.  If you requested that your care partner not be given the details of your procedure findings, then the procedure report has been included in a sealed envelope for you to review at your convenience later.  YOU SHOULD EXPECT: Some feelings of bloating in the abdomen. Passage of more gas than usual.  Walking can help get rid of the air that was put into your GI tract during the procedure and reduce the bloating. If you had a lower endoscopy (such as a colonoscopy or flexible sigmoidoscopy) you may notice spotting of blood in your stool or on the toilet paper. If you underwent a bowel prep for your procedure, you may not have a normal bowel movement for a few days.  Please Note:  You might notice some irritation and congestion in your nose or some drainage.  This is from the oxygen used during your procedure.  There is no need for concern and it should clear up in a day or so.  SYMPTOMS TO REPORT IMMEDIATELY:   Following lower endoscopy (colonoscopy or flexible sigmoidoscopy):  Excessive amounts of blood in the stool  Significant tenderness or worsening of abdominal pains  Swelling of the abdomen that is new, acute  Fever of 100F or higher    For urgent or emergent issues, a gastroenterologist can be reached at any hour by calling [phone].   DIET: Your first meal following the procedure should be a small meal and then it is ok to progress to your normal diet. Heavy or fried foods are harder to digest and may make you feel nauseous or bloated.  Likewise, meals heavy in dairy and vegetables can increase bloating.  Drink plenty of fluids but you should avoid alcoholic beverages for 24  hours.  ACTIVITY:  You should plan to take it easy for the rest of today and you should NOT DRIVE or use heavy machinery until tomorrow (because of the sedation medicines used during the test).    FOLLOW UP: Our staff will call the number listed on your records the next business day following your procedure to check on you and address any questions or concerns that you may have regarding the information given to you following your procedure. If we do not reach you, we will leave a message.  However, if you are feeling well and you are not experiencing any problems, there is no need to return our call.  We will assume that you have returned to your regular daily activities without incident.  If any biopsies were taken you will be contacted by phone or by letter within the next 1-3 weeks.  Please call us at [phone] if you have not heard about the biopsies in 3 weeks.    SIGNATURES/CONFIDENTIALITY: You and/or your care partner have signed paperwork which will be entered into your electronic medical record.  These signatures attest to the fact that that the information above on your After Visit Summary has been reviewed and is understood.  Full responsibility of the confidentiality of this discharge information lies with you and/or your care-partner.  Diverticulosis, high fiber diet information and hemorrhoid information given.

## 2016-03-15 ENCOUNTER — Telehealth: Payer: Self-pay | Admitting: *Deleted

## 2016-03-15 NOTE — Telephone Encounter (Signed)
Left message for patient to return our call if he has any questions or concerns. SM

## 2016-04-01 ENCOUNTER — Other Ambulatory Visit: Payer: Self-pay | Admitting: Family Medicine

## 2016-04-02 NOTE — Telephone Encounter (Signed)
This patient needs an appointment to be seen.

## 2016-04-02 NOTE — Telephone Encounter (Signed)
Last seen 5.20.16

## 2016-04-10 DIAGNOSIS — H2513 Age-related nuclear cataract, bilateral: Secondary | ICD-10-CM | POA: Diagnosis not present

## 2016-04-10 DIAGNOSIS — H40033 Anatomical narrow angle, bilateral: Secondary | ICD-10-CM | POA: Diagnosis not present

## 2016-04-15 ENCOUNTER — Encounter (INDEPENDENT_AMBULATORY_CARE_PROVIDER_SITE_OTHER): Payer: Self-pay

## 2016-04-15 ENCOUNTER — Ambulatory Visit (INDEPENDENT_AMBULATORY_CARE_PROVIDER_SITE_OTHER): Payer: 59 | Admitting: Family Medicine

## 2016-04-15 ENCOUNTER — Encounter: Payer: Self-pay | Admitting: Family Medicine

## 2016-04-15 VITALS — BP 115/65 | HR 72 | Temp 97.1°F | Ht 69.0 in | Wt 181.0 lb

## 2016-04-15 DIAGNOSIS — Z8551 Personal history of malignant neoplasm of bladder: Secondary | ICD-10-CM

## 2016-04-15 DIAGNOSIS — E031 Congenital hypothyroidism without goiter: Secondary | ICD-10-CM

## 2016-04-15 DIAGNOSIS — N5201 Erectile dysfunction due to arterial insufficiency: Secondary | ICD-10-CM

## 2016-04-15 DIAGNOSIS — Z0000 Encounter for general adult medical examination without abnormal findings: Secondary | ICD-10-CM

## 2016-04-15 DIAGNOSIS — E785 Hyperlipidemia, unspecified: Secondary | ICD-10-CM

## 2016-04-15 DIAGNOSIS — I6529 Occlusion and stenosis of unspecified carotid artery: Secondary | ICD-10-CM

## 2016-04-15 DIAGNOSIS — N400 Enlarged prostate without lower urinary tract symptoms: Secondary | ICD-10-CM

## 2016-04-15 DIAGNOSIS — Z8669 Personal history of other diseases of the nervous system and sense organs: Secondary | ICD-10-CM

## 2016-04-15 DIAGNOSIS — C679 Malignant neoplasm of bladder, unspecified: Secondary | ICD-10-CM

## 2016-04-15 DIAGNOSIS — R011 Cardiac murmur, unspecified: Secondary | ICD-10-CM

## 2016-04-15 LAB — URINALYSIS, COMPLETE
BILIRUBIN UA: NEGATIVE
GLUCOSE, UA: NEGATIVE
KETONES UA: NEGATIVE
NITRITE UA: NEGATIVE
Protein, UA: NEGATIVE
Specific Gravity, UA: 1.01 (ref 1.005–1.030)
UUROB: 0.2 mg/dL (ref 0.2–1.0)
pH, UA: 6.5 (ref 5.0–7.5)

## 2016-04-15 LAB — MICROSCOPIC EXAMINATION
BACTERIA UA: NONE SEEN
Epithelial Cells (non renal): NONE SEEN /hpf (ref 0–10)

## 2016-04-15 NOTE — Progress Notes (Signed)
Subjective:    Patient ID: Anthony Fireman [name], male    DOB: [birth year], 75 y.o.   MRN: [id number]  HPI Patient is here today for annual wellness exam and follow up of chronic medical problems which includes hypothyroid and hyperlipidemia. He is taking medications regularly.The patient does complain of some low back pain which is worse in the morning. He has a history of bladder cancer and continues to be followed by the urologist. We will do a PSA and prostate today and make sure that a copy of the lab work is sent to his urologist. He sees the urologist every 6 months on a regular basis. This is Dr. Jeffie Pollock. Family history wise his father had a history of hypertension his mother had ovarian cancer. He denies any chest pain or shortness of breath. He has no trouble with heartburn indigestion nausea vomiting diarrhea or blood in the stool. He is passing his water well other than passing frequently. He has no burning. He is seen the eye doctor recently. He recently had a colonoscopy in the gastroenterologist said that he did not need to have another one. He does not have my chart he was encouraged to sign up for this.     Patient Active Problem List   Diagnosis Date Noted  . Systolic murmur 91/66/0600  . History of bladder cancer 04/06/2014  . History of Guillain-Barre syndrome 04/06/2014  . Personal history of colonic polyps 04/06/2014  . BPH (benign prostatic hyperplasia) 04/06/2014  . Erectile dysfunction 04/06/2014  . Hyperlipidemia 04/06/2014  . Occlusion and stenosis of carotid artery without mention of cerebral infarction 02/01/2014  . Hypothyroidism 03/30/2013  . BCG vaccine causing adverse effect in therapeutic use 02/06/2012  . Epididymitis 01/13/2012  . Carotid stenosis 07/25/2011   Outpatient Encounter Prescriptions as of 04/15/2016  Medication Sig  . aspirin 81 MG EC tablet Take 81 mg by mouth every other day.   Marland Kitchen atorvastatin (LIPITOR) 20 MG tablet Take 1 tablet by mouth   daily  . BIOTIN 5000 PO Take 5,000 mg by mouth daily.  . Cholecalciferol (VITAMIN D) 2000 UNITS tablet Take 1,000 Units by mouth daily.   . Coenzyme Q10 (COQ10) 200 MG CAPS Take 1 capsule by mouth daily.  Marland Kitchen CRANBERRY PO Take 4,200 mg by mouth daily.  . Fe Fum-FePoly-FA-Vit C-Vit B3 (FOLIVANE-F) 125-1 MG CAPS TAKE ONE CAPSULE BY MOUTH ONE TIME DAILY  . Flaxseed, Linseed, (FLAX SEED OIL PO) Take 1,000 mg by mouth daily.  . hyoscyamine (LEVSIN SL) 0.125 MG SL tablet Place 1 tablet (0.125 mg total) under the tongue every 8 (eight) hours as needed.  Marland Kitchen levothyroxine (SYNTHROID, LEVOTHROID) 50 MCG tablet Take 1 tablet (50 mcg total) by mouth daily before breakfast.  . Magnesium 500 MG CAPS Take 1 capsule by mouth daily.   No facility-administered encounter medications on file as of 04/15/2016.      Review of Systems  Constitutional: Negative.   HENT: Negative.   Eyes: Negative.   Respiratory: Negative.   Cardiovascular: Negative.   Gastrointestinal: Negative.   Endocrine: Negative.   Genitourinary: Negative.   Musculoskeletal: Positive for back pain (low back - usually early morning).  Skin: Negative.   Allergic/Immunologic: Negative.   Neurological: Negative.   Hematological: Negative.   Psychiatric/Behavioral: Negative.        Objective:   Physical Exam  Constitutional: He is oriented to person, place, and time. He appears well-developed and well-nourished. No distress.  HENT:  Head: Normocephalic and atraumatic.  Right Ear: External ear normal.  Left Ear: External ear normal.  Nose: Nose normal.  Mouth/Throat: Oropharynx is clear and moist. No oropharyngeal exudate.  Eyes: Conjunctivae and EOM are normal. Pupils are equal, round, and reactive to light. Right eye exhibits no discharge. Left eye exhibits no discharge. No scleral icterus.  Neck: Normal range of motion. Neck supple. No thyromegaly present.  Radiated bruit from aortic sclerosis  Cardiovascular: Normal rate, regular  rhythm and intact distal pulses.  Exam reveals no gallop and no friction rub.   Murmur heard. Grade 3/6 systolic ejection murmur with a regular rate and rhythm at 72/m  Pulmonary/Chest: Effort normal and breath sounds normal. No respiratory distress. He has no wheezes. He has no rales. He exhibits no tenderness.  No axillary adenopathy  Abdominal: Soft. Bowel sounds are normal. He exhibits no mass. There is no tenderness. There is no rebound and no guarding.  No abdominal tenderness liver or spleen enlargement or bruits or inguinal adenopathy  Genitourinary: Rectum normal, prostate normal and penis normal.  Minimal if any enlargement with no lumps or masses. No rectal masses. No inguinal hernias bilaterally. No inguinal adenopathy and external genitalia were within normal limits  Musculoskeletal: Normal range of motion. He exhibits no edema or tenderness.  Lymphadenopathy:    He has no cervical adenopathy.  Neurological: He is alert and oriented to person, place, and time. He has normal reflexes. No cranial nerve deficit.  Skin: Skin is warm and dry. Rash noted.  Slight symmetrical rash around the anus on medial buttocks  Psychiatric: He has a normal mood and affect. His behavior is normal. Judgment and thought content normal.  Nursing note and vitals reviewed.  BP 115/65 mmHg  Pulse 72  Temp(Src) 97.1 F (36.2 C) (Oral)  Ht 5' 9"  (1.753 m)  Wt 181 lb (82.101 kg)  BMI 26.72 kg/m2        Assessment & Plan:  1. Annual physical exam -Continue to follow-up with urologist for bladder cancer - Urinalysis, Complete - BMP8+EGFR - CBC with Differential/Platelet - Hepatic function panel - NMR, lipoprofile - PSA, total and free - VITAMIN D 25 Hydroxy (Vit-D Deficiency, Fractures) - Thyroid Panel With TSH  2. BPH (benign prostatic hyperplasia) -Take lab results with you to next visit with urologist - Urinalysis, Complete - CBC with Differential/Platelet - PSA, total and free  3.  History of Guillain-Barre syndrome - CBC with Differential/Platelet  4. Hyperlipidemia -Continue atorvastatin and aggressive therapeutic lifestyle changes - CBC with Differential/Platelet - NMR, lipoprofile  5. History of primary bladder cancer -Continue to follow-up with urology - CBC with Differential/Platelet  6. Congenital hypothyroidism without goiter -Continue current treatment pending results of lab work - CBC with Differential/Platelet - Thyroid Panel With TSH  7. Systolic murmur -The patient has seen the cardiologist and he did not request any further follow-ups. Use thinking murmur was secondary to aortic sclerosis.  8. Erectile dysfunction due to arterial insufficiency -The patient's erectile dysfunction has improved.  9. Carotid stenosis, unspecified laterality  10. Malignant neoplasm of urinary bladder, unspecified site [facility]) -Continue to follow-up with urology currently on a six-month basis, Dr. Jeffie Pollock.  Patient Instructions                       Medicare Annual Wellness Visit  Anthony Noble and the medical providers at [facility] strive to bring you the best medical care.  In doing so we not only want to address your  current medical conditions and concerns but also to detect new conditions early and prevent illness, disease and health-related problems.    Medicare offers a yearly Wellness Visit which allows our clinical staff to assess your need for preventative services including immunizations, lifestyle education, counseling to decrease risk of preventable diseases and screening for fall risk and other medical concerns.    This visit is provided free of charge (no copay) for all Medicare recipients. The clinical pharmacists at [facility] have begun to conduct these Wellness Visits which will also include a thorough review of all your medications.    As you primary medical provider recommend that you make an appointment  for your Annual Wellness Visit if you have not done so already this year.  You may set up this appointment before you leave today or you may call back ([phone]) and schedule an appointment.  Please make sure when you call that you mention that you are scheduling your Annual Wellness Visit with the clinical pharmacist so that the appointment may be made for the proper length of time.     Continue current medications. Continue good therapeutic lifestyle changes which include good diet and exercise. Fall precautions discussed with patient. If an FOBT was given today- please return it to our front desk. If you are over 70 years old - you may need Prevnar 39 or the adult Pneumonia vaccine.  **Flu shots are available--- please call and schedule a FLU-CLINIC appointment**  After your visit with [place] today you will receive a survey in the mail or online from [employer] regarding your care with [place]. Please take a moment to fill this out. Your feedback is very important to [place] as you can help [place] better understand your patient needs as well as improve your experience and satisfaction. WE CARE ABOUT YOU!!!   The patient should continue to follow-up with the urologist and he should take a copy of his lab work with him to his next visit with the urologist. Since he's had a couple falls he needs to be more careful with this and more cautious about putting himself and to circumstances which could increase the risk of falls. We will call with lab work results as soon as those results become available   Arrie Senate MD

## 2016-04-15 NOTE — Patient Instructions (Addendum)
Medicare Annual Wellness Visit  [facility] and the medical providers at [facility] strive to bring you the best medical care.  In doing so we not only want to address your current medical conditions and concerns but also to detect new conditions early and prevent illness, disease and health-related problems.    Medicare offers a yearly Wellness Visit which allows our clinical staff to assess your need for preventative services including immunizations, lifestyle education, counseling to decrease risk of preventable diseases and screening for fall risk and other medical concerns.    This visit is provided free of charge (no copay) for all Medicare recipients. The clinical pharmacists at [facility] have begun to conduct these Wellness Visits which will also include a thorough review of all your medications.    As you primary medical provider recommend that you make an appointment for your Annual Wellness Visit if you have not done so already this year.  You may set up this appointment before you leave today or you may call back [phone] and schedule an appointment.  Please make sure when you call that you mention that you are scheduling your Annual Wellness Visit with the clinical pharmacist so that the appointment may be made for the proper length of time.     Continue current medications. Continue good therapeutic lifestyle changes which include good diet and exercise. Fall precautions discussed with patient. If an FOBT was given today- please return it to our front desk. If you are over 45 years old - you may need Prevnar 18 or the adult Pneumonia vaccine.  **Flu shots are available--- please call and schedule a FLU-CLINIC appointment**  After your visit with [place] today you will receive a survey in the mail or online from [employer] regarding your care with [place]. Please take a moment to fill this out. Your feedback is very  important to [place] as you can help [place] better understand your patient needs as well as improve your experience and satisfaction. WE CARE ABOUT YOU!!!   The patient should continue to follow-up with the urologist and he should take a copy of his lab work with him to his next visit with the urologist. Since he's had a couple falls he needs to be more careful with this and more cautious about putting himself and to circumstances which could increase the risk of falls. We will call with lab work results as soon as those results become available

## 2016-04-16 ENCOUNTER — Telehealth: Payer: Self-pay | Admitting: Family Medicine

## 2016-04-16 DIAGNOSIS — E039 Hypothyroidism, unspecified: Secondary | ICD-10-CM

## 2016-04-16 LAB — CBC WITH DIFFERENTIAL/PLATELET
BASOS: 0 %
Basophils Absolute: 0 10*3/uL (ref 0.0–0.2)
EOS (ABSOLUTE): 0.1 10*3/uL (ref 0.0–0.4)
EOS: 1 %
HEMATOCRIT: 40.7 % (ref 37.5–51.0)
Hemoglobin: 13.5 g/dL (ref 12.6–17.7)
IMMATURE GRANULOCYTES: 0 %
Immature Grans (Abs): 0 10*3/uL (ref 0.0–0.1)
LYMPHS ABS: 1.1 10*3/uL (ref 0.7–3.1)
Lymphs: 14 %
MCH: 29.4 pg (ref 26.6–33.0)
MCHC: 33.2 g/dL (ref 31.5–35.7)
MCV: 89 fL (ref 79–97)
MONOS ABS: 0.5 10*3/uL (ref 0.1–0.9)
Monocytes: 6 %
Neutrophils Absolute: 6.2 10*3/uL (ref 1.4–7.0)
Neutrophils: 79 %
PLATELETS: 196 10*3/uL (ref 150–379)
RBC: 4.59 x10E6/uL (ref 4.14–5.80)
RDW: 14.7 % (ref 12.3–15.4)
WBC: 8 10*3/uL (ref 3.4–10.8)

## 2016-04-16 LAB — NMR, LIPOPROFILE
Cholesterol: 169 mg/dL (ref 100–199)
HDL Cholesterol by NMR: 72 mg/dL (ref >39)
HDL Particle Number: 43.6 umol/L (ref >=30.5)
LDL Particle Number: 1236 nmol/L — ABNORMAL HIGH (ref <1000)
LDL SIZE: 20.4 nm (ref >20.5)
LDL-C: 86 mg/dL (ref 0–99)
LP-IR Score: <25 (ref <=45)
SMALL LDL PARTICLE NUMBER: 620 nmol/L — AB (ref <=527)
Triglycerides by NMR: 57 mg/dL (ref 0–149)

## 2016-04-16 LAB — BMP8+EGFR
BUN / CREAT RATIO: 15 (ref 10–24)
BUN: 17 mg/dL (ref 8–27)
CO2: 24 mmol/L (ref 18–29)
CREATININE: 1.11 mg/dL (ref 0.76–1.27)
Calcium: 9.1 mg/dL (ref 8.6–10.2)
Chloride: 101 mmol/L (ref 96–106)
GFR calc Af Amer: 75 mL/min/{1.73_m2} (ref >59)
GFR, EST NON AFRICAN AMERICAN: 65 mL/min/{1.73_m2} (ref >59)
GLUCOSE: 91 mg/dL (ref 65–99)
Potassium: 4.5 mmol/L (ref 3.5–5.2)
SODIUM: 143 mmol/L (ref 134–144)

## 2016-04-16 LAB — PSA, TOTAL AND FREE
PROSTATE SPECIFIC AG, SERUM: 1.3 ng/mL (ref 0.0–4.0)
PSA FREE: 0.26 ng/mL
PSA, Free Pct: 20 %

## 2016-04-16 LAB — THYROID PANEL WITH TSH
FREE THYROXINE INDEX: 2.2 (ref 1.2–4.9)
T3 Uptake Ratio: 25 % (ref 24–39)
T4 TOTAL: 8.6 ug/dL (ref 4.5–12.0)
TSH: 5.44 u[IU]/mL — ABNORMAL HIGH (ref 0.450–4.500)

## 2016-04-16 LAB — HEPATIC FUNCTION PANEL
ALBUMIN: 4.5 g/dL (ref 3.5–4.8)
ALT: 15 IU/L (ref 0–44)
AST: 21 IU/L (ref 0–40)
Alkaline Phosphatase: 61 IU/L (ref 39–117)
BILIRUBIN TOTAL: 0.5 mg/dL (ref 0.0–1.2)
Bilirubin, Direct: 0.12 mg/dL (ref 0.00–0.40)
TOTAL PROTEIN: 6.4 g/dL (ref 6.0–8.5)

## 2016-04-16 LAB — VITAMIN D 25 HYDROXY (VIT D DEFICIENCY, FRACTURES): Vit D, 25-Hydroxy: 43.3 ng/mL (ref 30.0–100.0)

## 2016-04-16 MED ORDER — LEVOTHYROXINE SODIUM 75 MCG PO TABS: 75.0000 ug | ORAL_TABLET | Freq: Every day | ORAL | Status: DC

## 2016-04-16 NOTE — Telephone Encounter (Signed)
Patient aware of results.

## 2016-05-03 ENCOUNTER — Other Ambulatory Visit: Payer: Self-pay | Admitting: *Deleted

## 2016-05-03 ENCOUNTER — Telehealth: Payer: Self-pay | Admitting: Family Medicine

## 2016-05-03 MED ORDER — HYOSCYAMINE SULFATE 0.125 MG SL SUBL: 0.1250 mg | SUBLINGUAL_TABLET | Freq: Three times a day (TID) | SUBLINGUAL | Status: DC | PRN

## 2016-05-03 NOTE — Telephone Encounter (Signed)
Pharm aware and rx sent

## 2016-05-22 ENCOUNTER — Other Ambulatory Visit: Payer: Self-pay | Admitting: Family Medicine

## 2016-06-10 DIAGNOSIS — G44219 Episodic tension-type headache, not intractable: Secondary | ICD-10-CM | POA: Diagnosis not present

## 2016-07-09 ENCOUNTER — Other Ambulatory Visit: Payer: Self-pay | Admitting: Family Medicine

## 2016-07-10 DIAGNOSIS — Z8551 Personal history of malignant neoplasm of bladder: Secondary | ICD-10-CM | POA: Diagnosis not present

## 2016-07-18 ENCOUNTER — Other Ambulatory Visit: Payer: Self-pay | Admitting: Family Medicine

## 2016-08-08 DIAGNOSIS — D225 Melanocytic nevi of trunk: Secondary | ICD-10-CM | POA: Diagnosis not present

## 2016-08-08 DIAGNOSIS — L821 Other seborrheic keratosis: Secondary | ICD-10-CM | POA: Diagnosis not present

## 2016-08-08 DIAGNOSIS — D1801 Hemangioma of skin and subcutaneous tissue: Secondary | ICD-10-CM | POA: Diagnosis not present

## 2016-08-08 DIAGNOSIS — L820 Inflamed seborrheic keratosis: Secondary | ICD-10-CM | POA: Diagnosis not present

## 2016-08-08 DIAGNOSIS — L570 Actinic keratosis: Secondary | ICD-10-CM | POA: Diagnosis not present

## 2016-08-08 DIAGNOSIS — D692 Other nonthrombocytopenic purpura: Secondary | ICD-10-CM | POA: Diagnosis not present

## 2016-08-20 ENCOUNTER — Ambulatory Visit (INDEPENDENT_AMBULATORY_CARE_PROVIDER_SITE_OTHER): Payer: 59 | Admitting: Pharmacist

## 2016-08-20 ENCOUNTER — Encounter: Payer: Self-pay | Admitting: Pharmacist

## 2016-08-20 VITALS — BP 132/70 | HR 74 | Ht 69.0 in | Wt 186.0 lb

## 2016-08-20 DIAGNOSIS — E039 Hypothyroidism, unspecified: Secondary | ICD-10-CM

## 2016-08-20 DIAGNOSIS — Z0000 Encounter for general adult medical examination without abnormal findings: Secondary | ICD-10-CM

## 2016-08-20 NOTE — Progress Notes (Signed)
Patient ID: Anthony Fireman [name], male   DOB: May 09, 1941, 75 y.o.   MRN: [id number]    Subjective:   Anthony Oakley Mahendra Pruitte. is a 75 y.o. male who presents for a subsequent Medicare Annual Wellness Visit.  Anthony, Noble reports that he feels well today.  He is very active - does yardwork and golfs regularly.   He is married and still works.  He has a Radio producer business.  He hs 2 grown daughters.    Current Medications (verified) Outpatient Encounter Prescriptions as of 08/20/2016  Medication Sig  . aspirin 81 MG EC tablet Take 81 mg by mouth every other day.   Marland Kitchen atorvastatin (LIPITOR) 20 MG tablet TAKE ONE TABLET BY MOUTH ONE TIME DAILY  . BIOTIN 5000 PO Take 5,000 mg by mouth daily.  . Cholecalciferol 10000 units CAPS Take 1,000 Units by mouth daily.  . Coenzyme Q10 (COQ10) 200 MG CAPS Take 1 capsule by mouth daily.  Marland Kitchen CRANBERRY PO Take 4,200 mg by mouth daily.  . Fe Fum-FePoly-FA-Vit C-Vit B3 (FOLIVANE-F) 125-1 MG CAPS TAKE ONE CAPSULE BY MOUTH ONE TIME DAILY  . Flaxseed, Linseed, (FLAX SEED OIL PO) Take 1,000 mg by mouth daily.  . hyoscyamine (LEVSIN SL) 0.125 MG SL tablet Place 1 tablet (0.125 mg total) under the tongue every 8 (eight) hours as needed.  Marland Kitchen levothyroxine (SYNTHROID, LEVOTHROID) 75 MCG tablet TAKE 1 TABLET (75 MCG TOTAL) BY MOUTH DAILY BEFORE BREAKFAST.  . Magnesium 500 MG CAPS Take 1 capsule by mouth daily.  . Multiple Vitamin (MULTIVITAMIN WITH MINERALS) TABS tablet Take 1 tablet by mouth daily.  . [DISCONTINUED] Cholecalciferol (VITAMIN D) 2000 UNITS tablet Take 1,000 Units by mouth daily.    No facility-administered encounter medications on file as of 08/20/2016.     Allergies (verified) Bcg live; Septra [bactrim]; Sulfa antibiotics; and Pertussis vaccines   History: Past Medical History:  Diagnosis Date  . Bilateral carotid artery stenosis without cerebral infarction    Asymptomatic---bilateral ICA <40%  and mild stenosis bilateral ECA  . BPH (benign  prostatic hypertrophy) with urinary obstruction   . Cancer [facility]) 2004   bladder  . Diverticulosis of colon   . Guillain Barr syndrome ([place]) 2001  . Heart murmur March /  April 2016  . History of bladder cancer    first dx 2004--  tx with chemo bladder instillation  . History of colon polyps   . History of pleural effusion    2001  . History of vertebral fracture    2001--   T12  . Hyperlipidemia   . Hypothyroidism   . Pneumonia 2001  . Varicose veins    Past Surgical History:  Procedure Laterality Date  . CYSTO/  BILATERAL RETROGRADE PYLOGRAM/  RESECTION BLADDER TUMOR  07-30-2011  . CYSTOSCOPY WITH BIOPSY N/A 01/12/2015   Procedure: CYSTOSCOPY WITH BLADDER  BIOPSY, FULGERATION;  Surgeon: Malka So, MD;  Location: [facility];  Service: Urology;  Laterality: N/A;  . NEGATIVE SLEEP STUDY  YRS AGO per pt  . ORIF RIGHT DISTAL RADIUS FX  12-31-2007  . TONSILLECTOMY  as child  . TRANSURETHRAL RESECTION OF BLADDER TUMOR WITH MITOMYCIN-C  06-07-2003  . VIDEO ASSISTED THORACOSCOPY (VATS)/EMPYEMA Right 05-01-2000   and Drainage of effusion   Family History  Problem Relation Age of Onset  . Other Mother     varicose veins  . Cancer Mother     Ovarian  . Varicose Veins Mother   . Nephrolithiasis Mother   .  COPD Father   . Hyperlipidemia Father   . Cancer Maternal Grandmother   . Bipolar disorder Daughter   . Colon cancer Neg Hx    Social History   Occupational History  . Not on file.   Social History Main Topics  . Smoking status: Former Smoker    Packs/day: 1.00    Years: 20.00    Types: Cigarettes    Quit date: 12/09/1981  . Smokeless tobacco: Never Used  . Alcohol use 1.2 oz/week    1 Standard drinks or equivalent, 1 Shots of liquor per week  . Drug use: No  . Sexual activity: Not on file    Do you feel safe at home?  No Are there smokers in your home (other than you)? No  Dietary issues and exercise activities discussed: Current Exercise  Habits: Home exercise routine, Type of exercise: Other - see comments (ellipitcal and golfing), Time (Minutes): 45, Frequency (Times/Week): 7, Weekly Exercise (Minutes/Week): 315, Intensity: Moderate  Current Dietary habits:  Eats small portions to maintain weight.    Cardiac Risk Factors include: advanced age (>50men, >68 women);dyslipidemia;male gender  Objective:    Today's Vitals   08/20/16 1026  BP: 132/70  Pulse: 74  Weight: 186 lb (84.4 kg)  Height: 5\' 9"  (1.753 m)  PainSc: 0-No pain   Body mass index is 27.47 kg/m.   Activities of Daily Living In your present state of health, do you have any difficulty performing the following activities: 08/20/2016  Hearing? N  Vision? N  Difficulty concentrating or making decisions? N  Walking or climbing stairs? N  Dressing or bathing? N  Doing errands, shopping? N  Preparing Food and eating ? N  Using the Toilet? N  In the past six months, have you accidently leaked urine? N  Do you have problems with loss of bowel control? N  Managing your Medications? N  Managing your Finances? N  Housekeeping or managing your Housekeeping? N  Some recent data might be hidden     Depression Screen PHQ 2/9 Scores 08/20/2016 04/15/2016 04/12/2015 03/21/2015  PHQ - 2 Score 0 0 0 0     Fall Risk Fall Risk  08/20/2016 04/15/2016 04/12/2015 03/21/2015 04/06/2014  Falls in the past year? Yes No No No No  Number falls in past yr: 1 - - - -  Injury with Fall? No - - - -    Cognitive Function: MMSE - Mini Mental State Exam 03/21/2015  Orientation to time 5  Orientation to Place 5  Registration 3  Attention/ Calculation 5  Recall 3  Language- name 2 objects 2  Language- repeat 1  Language- follow 3 step command 3  Language- read & follow direction 1  Write a sentence 1  Copy design 1  Total score 30    Immunizations and Health Maintenance Immunization History  Administered Date(s) Administered  . Pneumococcal Polysaccharide-23 02/06/2009  .  Td 12/10/2003   There are no preventive care reminders to display for this patient.  Patient Care Team: Chipper Herb, MD as PCP - General (Family Medicine) Irine Seal, MD (Urology) Rolm Bookbinder, MD (Dermatology) Viann Fish, NP as Nurse Practitioner (Vascular Surgery) Ladene Artist, MD as Consulting Physician (Gastroenterology)  Indicate any recent Medical Services you may have received from other than [facility] providers in the past year (date may be approximate).    Assessment:    Annual Wellness Visit  hypothyroidism - due to recheck today due to recent change in levothyroxine  dose.   Screening Tests Health Maintenance  Topic Date Due  . TETANUS/TDAP  11/15/2016 (Originally 08/09/2014)  . ZOSTAVAX  12/16/2016 (Originally 04/09/2002)  . INFLUENZA VACCINE  03/08/2017 (Originally 07/09/2016)  . PNA vac Low Risk Adult (2 of 2 - PCV13) 04/11/2018 (Originally 02/06/2010)        Plan:   During the course of the visit [place] was educated and counseled about the following appropriate screening and preventive services:   Vaccines to include Pneumoccal, Influenza,  Td, Zostavax - patient refuses all vaccines.  Has history of Gullian Barre syndrome  Colorectal cancer screening - UTD  Cardiovascular disease screening - UTD both carotid artery [place] and EKG  Diabetes screening - UTD  Glaucoma screening / Eye Exam - UTD  Nutrition counseling - continue to limit serving sizes.  Increase fruits and vegetables  Prostate cancer screening - UTD  UTD on yearly bladder cancer check (history of bladder cancer)  Advanced Directives - information discussed and given  Physical Activity - continue exercise daily great job   Orders Placed This Encounter  Procedures  . Thyroid Panel With TSH    Patient Instructions (the written plan) were given to the patient.   Cherre Robins, PharmD   08/21/2016

## 2016-08-20 NOTE — Patient Instructions (Addendum)
  Anthony Noble , Thank you for taking time to come for your Medicare Wellness Visit. I appreciate your ongoing commitment to your health goals. Please review the following plan we discussed and let me know if I can assist you in the future.   These are the goals we discussed: Continue to exercise regularly - you are doing great!  Increase intake of fruits and vegetables.    This is a list of the screening recommended for you and due dates:  Health Maintenance  Topic Date Due  . Flu Shot  No vaccines due to history of Gullian Barre Syndrome  . Tetanus Vaccine    . Shingles Vaccine    . Pneumonia vaccines (2 of 2 - PCV13)   *Topic was postponed. The date shown is not the original due date.

## 2016-08-21 LAB — THYROID PANEL WITH TSH
Free Thyroxine Index: 2.1 (ref 1.2–4.9)
T3 Uptake Ratio: 26 % (ref 24–39)
T4 TOTAL: 8 ug/dL (ref 4.5–12.0)
TSH: 2.64 u[IU]/mL (ref 0.450–4.500)

## 2016-10-04 ENCOUNTER — Other Ambulatory Visit: Payer: Self-pay | Admitting: Family Medicine

## 2016-10-15 ENCOUNTER — Other Ambulatory Visit: Payer: Self-pay | Admitting: *Deleted

## 2016-10-15 DIAGNOSIS — I6523 Occlusion and stenosis of bilateral carotid arteries: Secondary | ICD-10-CM

## 2016-10-16 ENCOUNTER — Encounter: Payer: Self-pay | Admitting: Family

## 2016-10-16 DIAGNOSIS — Z8551 Personal history of malignant neoplasm of bladder: Secondary | ICD-10-CM | POA: Diagnosis not present

## 2016-10-17 ENCOUNTER — Ambulatory Visit: Payer: Medicare Other | Admitting: Family

## 2016-10-17 ENCOUNTER — Inpatient Hospital Stay (HOSPITAL_COMMUNITY): Admission: RE | Admit: 2016-10-17 | Payer: Medicare Other | Source: Ambulatory Visit

## 2016-10-19 ENCOUNTER — Other Ambulatory Visit: Payer: Self-pay | Admitting: Family Medicine

## 2016-11-19 ENCOUNTER — Ambulatory Visit (INDEPENDENT_AMBULATORY_CARE_PROVIDER_SITE_OTHER): Payer: 59 | Admitting: Family Medicine

## 2016-11-19 ENCOUNTER — Encounter: Payer: Self-pay | Admitting: Family Medicine

## 2016-11-19 VITALS — BP 134/66 | HR 70 | Temp 97.3°F | Ht 69.0 in | Wt 182.0 lb

## 2016-11-19 DIAGNOSIS — J019 Acute sinusitis, unspecified: Secondary | ICD-10-CM

## 2016-11-19 MED ORDER — FLUTICASONE PROPIONATE 50 MCG/ACT NA SUSP: 1.0000 | Freq: Two times a day (BID) | NASAL | 6 refills | Status: DC | PRN

## 2016-11-19 NOTE — Progress Notes (Addendum)
BP 134/66   Pulse 70   Temp 97.3 F (36.3 C) (Oral)   Ht 5\' 9"  (1.753 m)   Wt 182 lb (82.6 kg)   BMI 26.88 kg/m    Subjective:    Patient ID: Anthony Monte., male    DOB: 1941/06/25, 75 y.o.   MRN: [id number]  HPI: Anthony Noble. is a 75 y.o. male presenting on 11/19/2016 for Sinusitis (nasal congestion, pressure; symptoms began 8 days ago) and Cough (productive; patient is taking Mucinex)   HPI Sinus congestion and drainage and cough Patient has been having sinus congestion and drainage and cough that's been going on for the past 8 days. He denies any fevers or chills or shortness of breath. He thinks he may have a little bit a wheeze last night but does not have it currently. His cough has been productive and he is been taking Mucinex which has been helping cleared out. He denies any sick contacts that he knows of. He is also been having postnasal drainage and nasal drainage.  Relevant past medical, surgical, family and social history reviewed and updated as indicated. Interim medical history since our last visit reviewed. Allergies and medications reviewed and updated.  Review of Systems  Constitutional: Negative for chills and fever.  HENT: Positive for congestion, postnasal drip, rhinorrhea, sinus pressure and sore throat. Negative for ear discharge, ear pain, sneezing and voice change.   Eyes: Negative for pain, discharge, redness and visual disturbance.  Respiratory: Positive for cough and wheezing. Negative for chest tightness and shortness of breath.   Cardiovascular: Negative for chest pain and leg swelling.  Musculoskeletal: Negative for gait problem.  Skin: Negative for rash.  All other systems reviewed and are negative.   Per HPI unless specifically indicated above     Objective:    BP 134/66   Pulse 70   Temp 97.3 F (36.3 C) (Oral)   Ht 5\' 9"  (1.753 m)   Wt 182 lb (82.6 kg)   BMI 26.88 kg/m   Wt Readings from Last 3 Encounters:    11/19/16 182 lb (82.6 kg)  08/20/16 186 lb (84.4 kg)  04/15/16 181 lb (82.1 kg)    Physical Exam  Constitutional: He is oriented to person, place, and time. He appears well-developed and well-nourished. No distress.  HENT:  Right Ear: Tympanic membrane, external ear and ear canal normal.  Left Ear: Tympanic membrane, external ear and ear canal normal.  Nose: Mucosal edema and rhinorrhea present. No sinus tenderness. No epistaxis. Right sinus exhibits no maxillary sinus tenderness and no frontal sinus tenderness. Left sinus exhibits no maxillary sinus tenderness and no frontal sinus tenderness.  Mouth/Throat: Uvula is midline and mucous membranes are normal. Posterior oropharyngeal edema and posterior oropharyngeal erythema present. No oropharyngeal exudate or tonsillar abscesses.  Eyes: Conjunctivae are normal. Right eye exhibits no discharge. Left eye exhibits no discharge. No scleral icterus.  Neck: Neck supple. No thyromegaly present.  Cardiovascular: Normal rate, regular rhythm and intact distal pulses.   Murmur (Holosystolic murmur, patient about this previously) heard. Pulmonary/Chest: Effort normal and breath sounds normal. No respiratory distress. He has no wheezes. He has no rales.  Musculoskeletal: Normal range of motion. He exhibits no edema.  Lymphadenopathy:    He has no cervical adenopathy.  Neurological: He is alert and oriented to person, place, and time. Coordination normal.  Skin: Skin is warm and dry. No rash noted. He is not diaphoretic.  Psychiatric: He has a normal mood  and affect. His behavior is normal.  Nursing note and vitals reviewed.     Assessment & Plan:   Problem List Items Addressed This Visit    None    Visit Diagnoses    Acute rhinosinusitis    -  Primary   Use Flonase at night and Mucinex and antihistamine, not improved in 3 or 4 days call back for Z-Pak   Relevant Medications   fluticasone (FLONASE) 50 MCG/ACT nasal spray       Follow up  plan: Return if symptoms worsen or fail to improve.  Counseling provided for all of the vaccine components No orders of the defined types were placed in this encounter.   Caryl Pina, MD [facility] 11/19/2016, 8:44 AM

## 2016-11-22 ENCOUNTER — Telehealth (HOSPITAL_COMMUNITY): Payer: Self-pay | Admitting: *Deleted

## 2016-11-22 NOTE — Telephone Encounter (Signed)
Anthony Noble will be rescheduled for q4 surveillance of his mild carotid stenosis.  Patient in agreement.

## 2016-12-18 ENCOUNTER — Ambulatory Visit: Payer: 59 | Admitting: Family

## 2016-12-18 ENCOUNTER — Inpatient Hospital Stay (HOSPITAL_COMMUNITY)
Admission: RE | Admit: 2016-12-18 | Discharge: 2016-12-18 | Disposition: A | Discharge status: Home / Self Care | Payer: 59 | Source: Ambulatory Visit

## 2016-12-18 DIAGNOSIS — I6523 Occlusion and stenosis of bilateral carotid arteries: Secondary | ICD-10-CM

## 2017-01-12 ENCOUNTER — Other Ambulatory Visit: Payer: Self-pay | Admitting: Family Medicine

## 2017-01-13 ENCOUNTER — Other Ambulatory Visit: Payer: Self-pay | Admitting: Family Medicine

## 2017-01-29 ENCOUNTER — Other Ambulatory Visit: Payer: Self-pay | Admitting: *Deleted

## 2017-01-29 DIAGNOSIS — Z8551 Personal history of malignant neoplasm of bladder: Secondary | ICD-10-CM | POA: Diagnosis not present

## 2017-01-29 MED ORDER — LEVOTHYROXINE SODIUM 75 MCG PO TABS: 75.0000 ug | ORAL_TABLET | Freq: Every day | ORAL | 1 refills | Status: DC

## 2017-03-20 DIAGNOSIS — H578 Other specified disorders of eye and adnexa: Secondary | ICD-10-CM | POA: Diagnosis not present

## 2017-03-24 ENCOUNTER — Other Ambulatory Visit: Payer: Self-pay

## 2017-03-24 MED ORDER — FOLIVANE-F 125-1 MG PO CAPS: 1.0000 | ORAL_CAPSULE | Freq: Every day | ORAL | 0 refills | Status: DC

## 2017-04-22 ENCOUNTER — Other Ambulatory Visit: Payer: Self-pay | Admitting: Family Medicine

## 2017-04-25 ENCOUNTER — Other Ambulatory Visit: Payer: Medicare Other

## 2017-04-25 DIAGNOSIS — E7800 Pure hypercholesterolemia, unspecified: Secondary | ICD-10-CM

## 2017-04-25 DIAGNOSIS — Z0000 Encounter for general adult medical examination without abnormal findings: Secondary | ICD-10-CM

## 2017-04-25 DIAGNOSIS — E039 Hypothyroidism, unspecified: Secondary | ICD-10-CM | POA: Diagnosis not present

## 2017-04-25 DIAGNOSIS — E559 Vitamin D deficiency, unspecified: Secondary | ICD-10-CM | POA: Diagnosis not present

## 2017-04-25 DIAGNOSIS — N400 Enlarged prostate without lower urinary tract symptoms: Secondary | ICD-10-CM

## 2017-04-25 LAB — URINALYSIS, COMPLETE
Bilirubin, UA: NEGATIVE
GLUCOSE, UA: NEGATIVE
Leukocytes, UA: NEGATIVE
Nitrite, UA: NEGATIVE
SPEC GRAV UA: 1.02 (ref 1.005–1.030)
Urobilinogen, Ur: 0.2 mg/dL (ref 0.2–1.0)
pH, UA: 6 (ref 5.0–7.5)

## 2017-04-25 LAB — MICROSCOPIC EXAMINATION
Bacteria, UA: NONE SEEN
Epithelial Cells (non renal): NONE SEEN /hpf (ref 0–10)
RBC MICROSCOPIC, UA: NONE SEEN /HPF (ref 0–?)
Renal Epithel, UA: NONE SEEN /hpf
WBC UA: NONE SEEN /HPF (ref 0–?)

## 2017-04-26 LAB — BMP8+EGFR
BUN / CREAT RATIO: 14 (ref 10–24)
BUN: 15 mg/dL (ref 8–27)
CHLORIDE: 102 mmol/L (ref 96–106)
CO2: 24 mmol/L (ref 18–29)
Calcium: 9.3 mg/dL (ref 8.6–10.2)
Creatinine, Ser: 1.06 mg/dL (ref 0.76–1.27)
GFR calc Af Amer: 79 mL/min/{1.73_m2} (ref >59)
GFR calc non Af Amer: 68 mL/min/{1.73_m2} (ref >59)
GLUCOSE: 95 mg/dL (ref 65–99)
POTASSIUM: 4.4 mmol/L (ref 3.5–5.2)
Sodium: 139 mmol/L (ref 134–144)

## 2017-04-26 LAB — CBC WITH DIFFERENTIAL/PLATELET
BASOS ABS: 0 10*3/uL (ref 0.0–0.2)
BASOS: 0 %
EOS (ABSOLUTE): 0.1 10*3/uL (ref 0.0–0.4)
Eos: 1 %
HEMOGLOBIN: 13.2 g/dL (ref 13.0–17.7)
Hematocrit: 41.1 % (ref 37.5–51.0)
Immature Grans (Abs): 0 10*3/uL (ref 0.0–0.1)
Immature Granulocytes: 0 %
LYMPHS ABS: 1.1 10*3/uL (ref 0.7–3.1)
Lymphs: 14 %
MCH: 29 pg (ref 26.6–33.0)
MCHC: 32.1 g/dL (ref 31.5–35.7)
MCV: 90 fL (ref 79–97)
Monocytes Absolute: 0.5 10*3/uL (ref 0.1–0.9)
Monocytes: 7 %
Neutrophils Absolute: 5.8 10*3/uL (ref 1.4–7.0)
Neutrophils: 78 %
PLATELETS: 200 10*3/uL (ref 150–379)
RBC: 4.55 x10E6/uL (ref 4.14–5.80)
RDW: 14.3 % (ref 12.3–15.4)
WBC: 7.5 10*3/uL (ref 3.4–10.8)

## 2017-04-26 LAB — LIPID PANEL
Chol/HDL Ratio: 2.5 ratio (ref 0.0–5.0)
Cholesterol, Total: 161 mg/dL (ref 100–199)
HDL: 65 mg/dL
LDL Calculated: 83 mg/dL (ref 0–99)
Triglycerides: 66 mg/dL (ref 0–149)
VLDL Cholesterol Cal: 13 mg/dL (ref 5–40)

## 2017-04-26 LAB — HEPATIC FUNCTION PANEL
ALK PHOS: 54 IU/L (ref 39–117)
ALT: 15 IU/L (ref 0–44)
AST: 22 IU/L (ref 0–40)
Albumin: 4.3 g/dL (ref 3.5–4.8)
BILIRUBIN, DIRECT: 0.18 mg/dL (ref 0.00–0.40)
Bilirubin Total: 0.7 mg/dL (ref 0.0–1.2)
TOTAL PROTEIN: 6.5 g/dL (ref 6.0–8.5)

## 2017-04-26 LAB — THYROID PANEL WITH TSH
Free Thyroxine Index: 2 (ref 1.2–4.9)
T3 UPTAKE RATIO: 27 % (ref 24–39)
T4 TOTAL: 7.4 ug/dL (ref 4.5–12.0)
TSH: 3.3 u[IU]/mL (ref 0.450–4.500)

## 2017-04-26 LAB — VITAMIN D 25 HYDROXY (VIT D DEFICIENCY, FRACTURES): VIT D 25 HYDROXY: 49.4 ng/mL (ref 30.0–100.0)

## 2017-04-28 ENCOUNTER — Ambulatory Visit (INDEPENDENT_AMBULATORY_CARE_PROVIDER_SITE_OTHER): Payer: Medicare Other | Admitting: Family Medicine

## 2017-04-28 ENCOUNTER — Ambulatory Visit (INDEPENDENT_AMBULATORY_CARE_PROVIDER_SITE_OTHER): Payer: Medicare Other

## 2017-04-28 ENCOUNTER — Ambulatory Visit: Payer: 59 | Admitting: Family Medicine

## 2017-04-28 ENCOUNTER — Encounter: Payer: Self-pay | Admitting: Family Medicine

## 2017-04-28 VITALS — BP 115/67 | HR 69 | Temp 96.7°F | Ht 69.0 in | Wt 177.0 lb

## 2017-04-28 DIAGNOSIS — H6122 Impacted cerumen, left ear: Secondary | ICD-10-CM

## 2017-04-28 DIAGNOSIS — E7800 Pure hypercholesterolemia, unspecified: Secondary | ICD-10-CM | POA: Diagnosis not present

## 2017-04-28 DIAGNOSIS — E039 Hypothyroidism, unspecified: Secondary | ICD-10-CM

## 2017-04-28 DIAGNOSIS — R011 Cardiac murmur, unspecified: Secondary | ICD-10-CM

## 2017-04-28 DIAGNOSIS — R0989 Other specified symptoms and signs involving the circulatory and respiratory systems: Secondary | ICD-10-CM

## 2017-04-28 DIAGNOSIS — Z0000 Encounter for general adult medical examination without abnormal findings: Secondary | ICD-10-CM | POA: Diagnosis not present

## 2017-04-28 DIAGNOSIS — Z8669 Personal history of other diseases of the nervous system and sense organs: Secondary | ICD-10-CM

## 2017-04-28 DIAGNOSIS — Z8551 Personal history of malignant neoplasm of bladder: Secondary | ICD-10-CM

## 2017-04-28 DIAGNOSIS — L570 Actinic keratosis: Secondary | ICD-10-CM | POA: Diagnosis not present

## 2017-04-28 DIAGNOSIS — N400 Enlarged prostate without lower urinary tract symptoms: Secondary | ICD-10-CM

## 2017-04-28 NOTE — Patient Instructions (Signed)
Medicare Annual Wellness Visit  [facility] and the medical providers at [facility] strive to bring you the best medical care.  In doing so we not only want to address your current medical conditions and concerns but also to detect new conditions early and prevent illness, disease and health-related problems.    Medicare offers a yearly Wellness Visit which allows our clinical staff to assess your need for preventative services including immunizations, lifestyle education, counseling to decrease risk of preventable diseases and screening for fall risk and other medical concerns.    This visit is provided free of charge (no copay) for all Medicare recipients. The clinical pharmacists at [facility] have begun to conduct these Wellness Visits which will also include a thorough review of all your medications.    As you primary medical provider recommend that you make an appointment for your Annual Wellness Visit if you have not done so already this year.  You may set up this appointment before you leave today or you may call back ([phone]) and schedule an appointment.  Please make sure when you call that you mention that you are scheduling your Annual Wellness Visit with the clinical pharmacist so that the appointment may be made for the proper length of time.     Continue current medications. Continue good therapeutic lifestyle changes which include good diet and exercise. Fall precautions discussed with patient. If an FOBT was given today- please return it to our front desk. If you are over 50 years old - you may need Prevnar 13 or the adult Pneumonia vaccine.  **Flu shots are available--- please call and schedule a FLU-CLINIC appointment**  After your visit with us today you will receive a survey in the mail or online from [employer] regarding your care with us. Please take a moment to fill this out. Your feedback is very  important to us as you can help us better understand your patient needs as well as improve your experience and satisfaction. WE CARE ABOUT YOU!!!    

## 2017-04-28 NOTE — Progress Notes (Signed)
Subjective:    Patient ID: Anthony Fireman [name], male    DOB: 1941/05/01, 76 y.o.   MRN: [id number]  HPI Patient is here today for annual wellness exam and follow up of chronic medical problems which includes hyperlipidemia and hypothyroid. He is taking medication regularly.The patient is followed regularly by the urologist for his PSA and prostate. He also has bladder cancer. He will be given an FOBT to return and he is had lab work done today we will will review during the visit today. He will also get a chest x-ray today. His vital signs are stable. Today he complains of some ringing in his ears and some tingling in his right hip. He also wants his face checked for skin lesions. 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 He also has hypothyroidism and is on thyroid replacement. His last colonoscopy was in April 2017. The patient's lab work has a urinalysis that is within normal limits. There are no white blood cells and no red blood cells. The blood sugar was good at 95 and the creatinine was within normal limits with good electrolytes including potassium. The CBC including white count and hemoglobin and platelet count were adequate. All liver function tests were good. The vitamin D level was good. All thyroid tests were within normal limits and he should continue with his current treatment. Cholesterol numbers with traditional lipid testing were also good with an LDL C being 83 and he should continue with his atorvastatin and diet and exercise. The patient denies any chest pain or shortness of breath. He denies any trouble with swallowing heartburn indigestion nausea vomiting diarrhea or blood in the stool. He's passing his water well and sees the urologist for his bladder cancer again in August. The urologist does not check his prostate. So we will do this. He does complain of some tinnitus which seems to be going on all the time now. He does not have any trouble  with his hearing. He has determined that his wallet has caused tingling in his right hip when he wears his wallet regularly. He also sees the dermatologist every year and we'll see her in the fall but also said check some dry patches of skin on his face today.    Patient Active Problem List   Diagnosis Date Noted  . Systolic murmur 21/19/4174  . Bladder cancer ([place]) 04/06/2014  . History of Guillain-Barre syndrome 04/06/2014  . Personal history of colonic polyps 04/06/2014  . BPH (benign prostatic hyperplasia) 04/06/2014  . Erectile dysfunction 04/06/2014  . Hyperlipidemia 04/06/2014  . Occlusion and stenosis of carotid artery without mention of cerebral infarction 02/01/2014  . Hypothyroidism 03/30/2013  . BCG vaccine causing adverse effect in therapeutic use 02/06/2012  . Epididymitis 01/13/2012  . Carotid stenosis 07/25/2011   Outpatient Encounter Prescriptions as of 04/28/2017  Medication Sig  . aspirin 81 MG EC tablet Take 81 mg by mouth every other day.   Marland Kitchen atorvastatin (LIPITOR) 20 MG tablet TAKE ONE TABLET BY MOUTH ONE TIME DAILY  . BIOTIN 5000 PO Take 5,000 mg by mouth daily.  . cholecalciferol (VITAMIN D) 1000 units tablet Take 5,000 Units by mouth daily.   . Coenzyme Q10 (COQ10) 200 MG CAPS Take 1 capsule by mouth daily.  Marland Kitchen CRANBERRY PO Take 4,200 mg by mouth daily.  . Fe Fum-FePoly-FA-Vit C-Vit B3 (FOLIVANE-F) 125-1 MG CAPS Take 1 capsule by mouth daily.  . Flaxseed, Linseed, (FLAX SEED OIL  PO) Take 1,000 mg by mouth daily.  Marland Kitchen levothyroxine (SYNTHROID, LEVOTHROID) 75 MCG tablet Take 1 tablet (75 mcg total) by mouth daily before breakfast.  . Magnesium 500 MG CAPS Take 1 capsule by mouth daily.  . Multiple Vitamin (MULTIVITAMIN WITH MINERALS) TABS tablet Take 1 tablet by mouth daily.  . fluticasone (FLONASE) 50 MCG/ACT nasal spray Place 1 spray into both nostrils 2 (two) times daily as needed for allergies or rhinitis. (Patient not taking: Reported on 04/28/2017)  .  [DISCONTINUED] hyoscyamine (LEVSIN SL) 0.125 MG SL tablet Place 1 tablet (0.125 mg total) under the tongue every 8 (eight) hours as needed.   No facility-administered encounter medications on file as of 04/28/2017.       Review of Systems  Constitutional: Negative.   HENT: Negative.        Ringing in ears  Eyes: Negative.   Respiratory: Negative.   Cardiovascular: Negative.   Gastrointestinal: Negative.   Endocrine: Negative.   Genitourinary: Negative.   Musculoskeletal: Negative.        Numbness in right hip area  Skin: Negative.   Allergic/Immunologic: Negative.   Neurological: Negative.   Hematological: Negative.   Psychiatric/Behavioral: Negative.        Objective:   Physical Exam  Constitutional: He is oriented to person, place, and time. He appears well-developed and well-nourished. No distress.  HENT:  Head: Normocephalic and atraumatic.  Right Ear: External ear normal.  Nose: Nose normal.  Mouth/Throat: Oropharynx is clear and moist. No oropharyngeal exudate.  Ear cerumen left ear canal  Eyes: Conjunctivae and EOM are normal. Pupils are equal, round, and reactive to light. Right eye exhibits no discharge. Left eye exhibits no discharge. No scleral icterus.  Neck: Normal range of motion. Neck supple. No thyromegaly present.  Bilateral carotid bruits left more prominent than right  Cardiovascular: Normal rate, regular rhythm and intact distal pulses.   No murmur heard. The heart has a regular rate and rhythm with a grade 1/6 systolic ejection murmur with a rate of 72/m  Pulmonary/Chest: Effort normal and breath sounds normal. No respiratory distress. He has no wheezes. He has no rales. He exhibits no tenderness.  Clear anteriorly and posteriorly and no axillary adenopathy  Abdominal: Soft. Bowel sounds are normal. He exhibits no mass. There is tenderness. There is no rebound and no guarding.  No abdominal  masses or bruits. Slight epigastric tenderness. No inguinal  adenopathy.  Genitourinary: Rectum normal and penis normal.  Genitourinary Comments: The prostate was minimally enlarged without any lumps or masses. The rectal exam was negative. There were no inguinal hernias palpable in neck supple genitalia were within normal limits.  Musculoskeletal: Normal range of motion. He exhibits no edema.  Lower extremity varicosities  Lymphadenopathy:    He has no cervical adenopathy.  Neurological: He is alert and oriented to person, place, and time. He has normal reflexes. No cranial nerve deficit.  Skin: Skin is warm and dry. No rash noted.  There were several actinic keratoses on the patient's face and cryotherapy was performed on these and the patient tolerated the procedure well.  Psychiatric: He has a normal mood and affect. His behavior is normal. Judgment and thought content normal.  Nursing note and vitals reviewed.   BP 115/67 (BP Location: Left Arm)   Pulse 69   Temp (!) 96.7 F (35.9 C) (Oral)   Ht 5\' 9"  (1.753 m)   Wt 177 lb (80.3 kg)   BMI 26.14 kg/m  Assessment & Plan:  1. Annual physical exam -All labs were reviewed with patient and he had no questions regarding these labs. - DG Chest 2 View; Future  2. Hypothyroidism (acquired) -Continue current dose of thyroid medicine  3. Benign prostatic hyperplasia, unspecified whether lower urinary tract symptoms present -The prostate gland is only slightly enlarged and he's having no symptoms other than his regular follow-up for his bladder cancer with the urologist.  4. Pure hypercholesterolemia -Cholesterol numbers were good and he will continue to follow-up with taking his cholesterol medicine and following aggressive therapeutic lifestyle changes - DG Chest 2 View; Future  5. History of primary bladder cancer -Continue with urology visits  6. History of Guillain-Barre syndrome -No tetanus given due to this history of DM, Wray syndrome - DG Chest 2 View; Future  7. Left ear  impacted cerumen -Ear irrigation  8. Systolic murmur -This is stable and it is a systolic ejection murmur.  9. Bilateral carotid bruits -The patient will be referred back for carotid Dopplers.  10. Actinic keratoses -Cryotherapy to 3 separate lesions on face and patient tolerated procedure well  No orders of the defined types were placed in this encounter.  Patient Instructions                       Medicare Annual Wellness Visit  [facility] and the medical providers at [facility] strive to bring you the best medical care.  In doing so we not only want to address your current medical conditions and concerns but also to detect new conditions early and prevent illness, disease and health-related problems.    Medicare offers a yearly Wellness Visit which allows our clinical staff to assess your need for preventative services including immunizations, lifestyle education, counseling to decrease risk of preventable diseases and screening for fall risk and other medical concerns.    This visit is provided free of charge (no copay) for all Medicare recipients. The clinical pharmacists at [facility] have begun to conduct these Wellness Visits which will also include a thorough review of all your medications.    As you primary medical provider recommend that you make an appointment for your Annual Wellness Visit if you have not done so already this year.  You may set up this appointment before you leave today or you may call back ([phone]) and schedule an appointment.  Please make sure when you call that you mention that you are scheduling your Annual Wellness Visit with the clinical pharmacist so that the appointment may be made for the proper length of time.     Continue current medications. Continue good therapeutic lifestyle changes which include good diet and exercise. Fall precautions discussed with patient. If an FOBT was given today- please  return it to our front desk. If you are over 1 years old - you may need Prevnar 51 or the adult Pneumonia vaccine.  **Flu shots are available--- please call and schedule a FLU-CLINIC appointment**  After your visit with [place] today you will receive a survey in the mail or online from [employer] regarding your care with [place]. Please take a moment to fill this out. Your feedback is very important to [place] as you can help [place] better understand your patient needs as well as improve your experience and satisfaction. WE CARE ABOUT YOU!!!     Arrie Senate MD

## 2017-07-14 ENCOUNTER — Telehealth: Payer: Self-pay | Admitting: Family Medicine

## 2017-07-14 NOTE — Telephone Encounter (Signed)
Please review and advise.

## 2017-07-15 MED ORDER — HYOSCYAMINE SULFATE 0.125 MG SL SUBL: 0.1250 mg | SUBLINGUAL_TABLET | Freq: Three times a day (TID) | SUBLINGUAL | 1 refills | Status: DC | PRN

## 2017-07-15 NOTE — Telephone Encounter (Signed)
Hyoscyamine is okay to refill

## 2017-07-15 NOTE — Telephone Encounter (Signed)
Pt notified of RX Okayed per Dr Laurance Flatten

## 2017-07-24 ENCOUNTER — Other Ambulatory Visit: Payer: Self-pay | Admitting: Family Medicine

## 2017-07-30 DIAGNOSIS — N401 Enlarged prostate with lower urinary tract symptoms: Secondary | ICD-10-CM | POA: Diagnosis not present

## 2017-07-30 DIAGNOSIS — Z8551 Personal history of malignant neoplasm of bladder: Secondary | ICD-10-CM | POA: Diagnosis not present

## 2017-08-07 DIAGNOSIS — L821 Other seborrheic keratosis: Secondary | ICD-10-CM | POA: Diagnosis not present

## 2017-08-07 DIAGNOSIS — L820 Inflamed seborrheic keratosis: Secondary | ICD-10-CM | POA: Diagnosis not present

## 2017-08-07 DIAGNOSIS — L570 Actinic keratosis: Secondary | ICD-10-CM | POA: Diagnosis not present

## 2017-08-13 ENCOUNTER — Other Ambulatory Visit: Payer: Self-pay | Admitting: Family Medicine

## 2017-10-20 ENCOUNTER — Other Ambulatory Visit: Payer: Self-pay | Admitting: Family Medicine

## 2017-10-20 NOTE — Telephone Encounter (Signed)
Last lipid 04/25/17  DWM

## 2017-10-21 NOTE — Telephone Encounter (Signed)
Last refill without being seen 

## 2017-11-10 ENCOUNTER — Other Ambulatory Visit: Payer: Self-pay | Admitting: Family Medicine

## 2017-12-16 DIAGNOSIS — R69 Illness, unspecified: Secondary | ICD-10-CM | POA: Diagnosis not present

## 2017-12-18 ENCOUNTER — Telehealth: Payer: Self-pay | Admitting: Family Medicine

## 2017-12-18 NOTE — Telephone Encounter (Signed)
Please have patient speak with the pharmacist and see if there is an alternative that will provide the same ingredients at a lower cost

## 2017-12-18 NOTE — Telephone Encounter (Signed)
LM for pt to call insurance or pharmacy - and to let us know what we need to send in

## 2018-01-24 ENCOUNTER — Other Ambulatory Visit: Payer: Self-pay | Admitting: Nurse Practitioner

## 2018-01-26 DIAGNOSIS — Z8551 Personal history of malignant neoplasm of bladder: Secondary | ICD-10-CM | POA: Diagnosis not present

## 2018-01-26 DIAGNOSIS — R351 Nocturia: Secondary | ICD-10-CM | POA: Diagnosis not present

## 2018-01-26 DIAGNOSIS — N401 Enlarged prostate with lower urinary tract symptoms: Secondary | ICD-10-CM | POA: Diagnosis not present

## 2018-01-26 NOTE — Telephone Encounter (Signed)
Last lipid 04/25/17  DWM

## 2018-02-10 ENCOUNTER — Other Ambulatory Visit: Payer: Self-pay | Admitting: Family Medicine

## 2018-02-15 ENCOUNTER — Other Ambulatory Visit: Payer: Self-pay | Admitting: Family Medicine

## 2018-02-25 ENCOUNTER — Other Ambulatory Visit: Payer: Self-pay | Admitting: Family Medicine

## 2018-02-25 MED ORDER — LEVOTHYROXINE SODIUM 75 MCG PO TABS: 75.0000 ug | ORAL_TABLET | Freq: Every day | ORAL | 2 refills | Status: DC

## 2018-02-25 NOTE — Telephone Encounter (Signed)
Refill sent in

## 2018-03-19 ENCOUNTER — Encounter: Payer: Self-pay | Admitting: *Deleted

## 2018-03-19 ENCOUNTER — Ambulatory Visit (INDEPENDENT_AMBULATORY_CARE_PROVIDER_SITE_OTHER): Payer: Medicare HMO | Admitting: *Deleted

## 2018-03-19 VITALS — BP 110/58 | HR 64 | Ht 67.0 in | Wt 184.0 lb

## 2018-03-19 DIAGNOSIS — Z0000 Encounter for general adult medical examination without abnormal findings: Secondary | ICD-10-CM | POA: Diagnosis not present

## 2018-03-19 NOTE — Patient Instructions (Signed)
  Mr. Sollenberger , Thank you for taking time to come for your Medicare Wellness Visit. I appreciate your ongoing commitment to your health goals. Please review the following plan we discussed and let me know if I can assist you in the future.   These are the goals we discussed: Goals    . Exercise 3x per week (30 min per time)       This is a list of the screening recommended for you and due dates:  Health Maintenance  Topic Date Due  . Flu Shot  Discontinued  . Tetanus Vaccine  Discontinued  . Pneumonia vaccines  Discontinued

## 2018-03-19 NOTE — Progress Notes (Addendum)
Pl  Subjective:   Anthony Noble. is a 77 y.o. male who presents for a subsequent Medicare Annual Wellness Visit. Mr Macrae is married and lives at home with his wife. They have two adult daughters. Their oldest daughter has bipolar disorder and lives with them. Their youngest is married and lives in [place]. She is doing well. He is very active physically and socially. He plays golf several times a week and works out at [employer] every weekday morning. He uses the elliptical and weights. 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 He was recently named [employer] [place] of the [place]. 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   Review of Systems  Health is about the same as last year.   Cardiac Risk Factors include: advanced age (>25men, >63 women);dyslipidemia;male gender    Objective:    Today's Vitals   03/19/18 1504  BP: (!) 110/58  Pulse: 64  Weight: 184 lb (83.5 kg)  Height: 5\' 7"  (1.702 m)   Body mass index is 28.82 kg/m.  Advanced Directives 08/20/2016 02/29/2016 04/13/2015 03/21/2015 01/12/2015  Does Patient Have a Medical Advance Directive? No No No No No  Would patient like information on creating a medical advance directive? Yes - Scientist, clinical (histocompatibility and immunogenetics) given - Yes - Educational materials given Yes - Scientist, clinical (histocompatibility and immunogenetics) given Yes - Educational materials given    Current Medications (verified) Outpatient Encounter Medications as of 03/19/2018  Medication Sig  . aspirin 81 MG EC tablet Take 81 mg by mouth every other day.   Marland Kitchen atorvastatin (LIPITOR) 20 MG tablet TAKE 1 TABLET BY MOUTH EVERY DAY  . BIOTIN 5000 PO Take 5,000 mg by mouth daily.  . cholecalciferol (VITAMIN D) 1000 units tablet Take 5,000 Units by mouth daily.   . Coenzyme Q10 (COQ10) 200 MG CAPS Take 1 capsule by mouth daily.  Marland Kitchen CRANBERRY PO Take 4,200 mg by mouth daily.  . Fe  Fum-FePoly-FA-Vit C-Vit B3 (FOLIVANE-F) 125-1 MG CAPS TAKE 1 CAPSULE BY MOUTH EVERY DAY  . Flaxseed, Linseed, (FLAX SEED OIL PO) Take 1,000 mg by mouth daily.  . fluticasone (FLONASE) 50 MCG/ACT nasal spray Place 1 spray into both nostrils 2 (two) times daily as needed for allergies or rhinitis.  . hyoscyamine (LEVSIN SL) 0.125 MG SL tablet Place 1 tablet (0.125 mg total) under the tongue every 8 (eight) hours as needed.  Marland Kitchen levothyroxine (SYNTHROID, LEVOTHROID) 75 MCG tablet Take 1 tablet (75 mcg total) by mouth daily before breakfast.  . Magnesium 500 MG CAPS Take 1 capsule by mouth daily.  . Multiple Vitamin (MULTIVITAMIN WITH MINERALS) TABS tablet Take 1 tablet by mouth daily.   No facility-administered encounter medications on file as of 03/19/2018.     Allergies (verified) Bcg live; Septra [bactrim]; Sulfa antibiotics; and Pertussis vaccines   History: Past Medical History:  Diagnosis Date  . Bilateral carotid artery stenosis without cerebral infarction    Asymptomatic---bilateral ICA <40%  and mild stenosis bilateral ECA  . BPH (benign prostatic hypertrophy) with urinary obstruction   . Cancer [facility]) 2004   bladder  . Diverticulosis of colon   . Guillain Barr syndrome ([place]) 2001  . Heart murmur March /  April 2016  . History of bladder cancer    first dx 2004--  tx with chemo bladder instillation  . History of colon polyps   . History  of pleural effusion    2001  . History of vertebral fracture    2001--   T12  . Hyperlipidemia   . Hypothyroidism   . Pneumonia 2001  . Varicose veins    Past Surgical History:  Procedure Laterality Date  . CYSTO/  BILATERAL RETROGRADE PYLOGRAM/  RESECTION BLADDER TUMOR  07-30-2011  . CYSTOSCOPY WITH BIOPSY N/A 01/12/2015   Procedure: CYSTOSCOPY WITH BLADDER  BIOPSY, FULGERATION;  Surgeon: Malka So, MD;  Location: [facility];  Service: Urology;  Laterality: N/A;  . NEGATIVE SLEEP STUDY  YRS AGO per pt  . ORIF RIGHT  DISTAL RADIUS FX  12-31-2007  . TONSILLECTOMY  as child  . TRANSURETHRAL RESECTION OF BLADDER TUMOR WITH MITOMYCIN-C  06-07-2003  . VIDEO ASSISTED THORACOSCOPY (VATS)/EMPYEMA Right 05-01-2000   and Drainage of effusion   Family History  Problem Relation Age of Onset  . Other Mother        varicose veins  . Cancer Mother        Ovarian  . Varicose Veins Mother   . Nephrolithiasis Mother   . COPD Father   . Hyperlipidemia Father   . Cancer Maternal Grandmother   . Bipolar disorder Daughter   . Healthy Daughter   . Hypertension Sister   . Healthy Daughter   . Colon cancer Neg Hx    Social History   Socioeconomic History  . Marital status: Married    Spouse name: Not on file  . Number of children: 2  . Years of education: Not on file  . Highest education level: Not on file  Occupational History  . Occupation: retired    Comment: Electrical engineer  Social Needs  . Financial resource strain: Not hard at all  . Food insecurity:    Worry: Never true    Inability: Never true  . Transportation needs:    Medical: No    Non-medical: No  Tobacco Use  . Smoking status: Former Smoker    Packs/day: 1.00    Years: 20.00    Pack years: 20.00    Types: Cigarettes    Last attempt to quit: 12/09/1981    Years since quitting: 36.3  . Smokeless tobacco: Never Used  Substance and Sexual Activity  . Alcohol use: Yes    Alcohol/week: 1.2 oz    Types: 1 Shots of liquor, 1 Standard drinks or equivalent per week  . Drug use: No  . Sexual activity: Not on file  Lifestyle  . Physical activity:    Days per week: 5 days    Minutes per session: 60 min  . Stress: Not at all  Relationships  . Social connections:    Talks on phone: More than three times a week    Gets together: More than three times a week    Attends religious service: More than 4 times per year    Active member of club or organization: Yes    Attends meetings of clubs or organizations: More than 4 times per year     Relationship status: Not on file  Other Topics Concern  . Not on file  Social History Narrative   Lives at home with wife and daughter.      Tobacco Counseling No tobacco use  Clinical Intake: Pain : No/denies pain  Nutritional Status: BMI of 19-24  Normal Diabetes: No  How often do you need to have someone help you when you read instructions, pamphlets, or other written materials from your  doctor or pharmacy?: 1 - Never What is the last grade level you completed in school?: associates degree in management  Interpreter Needed?: No  Information entered by :: Chong Sicilian, RN    Activities of Daily Living In your present state of health, do you have any difficulty performing the following activities: 03/19/2018  Hearing? N  Comment tinittus, mostly at night  Vision? N  Difficulty concentrating or making decisions? N  Walking or climbing stairs? N  Dressing or bathing? N  Preparing Food and eating ? N  Using the Toilet? N  In the past six months, have you accidently leaked urine? N  Do you have problems with loss of bowel control? N  Managing your Medications? N  Managing your Finances? N  Housekeeping or managing your Housekeeping? N  Some recent data might be hidden     Immunizations and Health Maintenance Immunization History  Administered Date(s) Administered  . Pneumococcal Polysaccharide-23 02/06/2009  . Td 12/10/2003   There are no preventive care reminders to display for this patient.  Patient Care Team: Chipper Herb, MD as PCP - General (Family Medicine) Irine Seal, MD (Urology) Rolm Bookbinder, MD (Dermatology) Nickel, Sharmon Leyden, NP as Nurse Practitioner (Vascular Surgery) Ladene Artist, MD as Consulting Physician (Gastroenterology)  No hospitalizations, ER visits, or surgeries this past year.     Assessment:   This is a routine wellness examination for [place].  Hearing/Vision screen No deficits noted during visit. Last eye exam was 3 years ago.     Dietary issues and exercise activities discussed:  Diet Drinks about 32 oz water. She also drinks tea and 2 or 3 cokes a day. Eats two meals a day and popcorn as a snack and trail mix  Current Exercise Habits: Structured exercise class, Type of exercise: walking;strength training/weights;Other - see comments(golf and eliptical), Time (Minutes): 60, Frequency (Times/Week): 5, Weekly Exercise (Minutes/Week): 300, Intensity: Moderate, Exercise limited by: None identified  Goals    . Exercise 3x per week (30 min per time)      Depression Screen PHQ 2/9 Scores 03/19/2018 11/19/2016 08/20/2016 04/15/2016  PHQ - 2 Score 0 0 0 0    Fall Risk Fall Risk  03/19/2018 11/19/2016 08/20/2016 04/15/2016 04/12/2015  Falls in the past year? Yes Yes Yes No No  Number falls in past yr: 1 1 1  - -  Injury with Fall? Yes Yes No - -  Comment minor knee abrasion skinned elbow and bruised hip - - -  Risk for fall due to : History of fall(s) - - - -  Follow up Falls prevention discussed - - - -    Is the patient's home free of loose throw rugs in walkways, pet beds, electrical cords, etc?   yes      Grab bars in the bathroom? no      Handrails on the stairs?   yes      Adequate lighting?   no  Cognitive Function: MMSE - Mini Mental State Exam 03/19/2018 03/21/2015  Orientation to time 5 5  Orientation to Place 5 5  Registration 3 3  Attention/ Calculation 4 5  Recall 3 3  Language- name 2 objects 2 2  Language- repeat 1 1  Language- follow 3 step command 3 3  Language- read & follow direction 1 1  Write a sentence 1 1  Copy design 1 1  Total score 29 30  Normal  Screening Tests Health Maintenance  Topic Date Due  .  INFLUENZA VACCINE  Discontinued  . TETANUS/TDAP  Discontinued  . PNA vac Low Risk Adult  Discontinued      Plan:  Schedule eye exam Keep follow up appt with PCP Continue to stay active for at least 150 minutes a week  I have personally reviewed and noted the following in the  patient's chart:   . Medical and social history . Use of alcohol, tobacco or illicit drugs  . Current medications and supplements . Functional ability and status . Nutritional status . Physical activity . Advanced directives . List of other physicians . Hospitalizations, surgeries, and ER visits in previous 12 months . Vitals . Screenings to include cognitive, depression, and falls . Referrals and appointments  In addition, I have reviewed and discussed with patient certain preventive protocols, quality metrics, and best practice recommendations. A written personalized care plan for preventive services as well as general preventive health recommendations were provided to patient.     Chong Sicilian, RN   03/19/2018  I have reviewed and agree with the above AWV documentation.   Mary-Margaret Hassell Done, FNP

## 2018-04-28 ENCOUNTER — Other Ambulatory Visit: Payer: Medicare HMO

## 2018-04-28 DIAGNOSIS — E559 Vitamin D deficiency, unspecified: Secondary | ICD-10-CM

## 2018-04-28 DIAGNOSIS — N400 Enlarged prostate without lower urinary tract symptoms: Secondary | ICD-10-CM

## 2018-04-28 DIAGNOSIS — Z8669 Personal history of other diseases of the nervous system and sense organs: Secondary | ICD-10-CM | POA: Diagnosis not present

## 2018-04-28 DIAGNOSIS — Z8551 Personal history of malignant neoplasm of bladder: Secondary | ICD-10-CM

## 2018-04-28 DIAGNOSIS — E7800 Pure hypercholesterolemia, unspecified: Secondary | ICD-10-CM | POA: Diagnosis not present

## 2018-04-28 DIAGNOSIS — E039 Hypothyroidism, unspecified: Secondary | ICD-10-CM

## 2018-04-28 LAB — URINALYSIS, COMPLETE
Bilirubin, UA: NEGATIVE
Glucose, UA: NEGATIVE
Ketones, UA: NEGATIVE
Leukocytes, UA: NEGATIVE
NITRITE UA: NEGATIVE
Protein, UA: NEGATIVE
RBC, UA: NEGATIVE
Specific Gravity, UA: 1.01 (ref 1.005–1.030)
Urobilinogen, Ur: 0.2 mg/dL (ref 0.2–1.0)
pH, UA: 5.5 (ref 5.0–7.5)

## 2018-04-28 LAB — MICROSCOPIC EXAMINATION
BACTERIA UA: NONE SEEN
Epithelial Cells (non renal): NONE SEEN /hpf (ref 0–10)
RBC, UA: NONE SEEN /hpf (ref 0–2)
Renal Epithel, UA: NONE SEEN /hpf
WBC UA: NONE SEEN /HPF (ref 0–5)

## 2018-04-29 ENCOUNTER — Ambulatory Visit (INDEPENDENT_AMBULATORY_CARE_PROVIDER_SITE_OTHER): Payer: Medicare HMO | Admitting: Family Medicine

## 2018-04-29 ENCOUNTER — Telehealth: Payer: Self-pay | Admitting: Family Medicine

## 2018-04-29 ENCOUNTER — Encounter: Payer: Self-pay | Admitting: Family Medicine

## 2018-04-29 VITALS — BP 124/60 | HR 67 | Temp 95.7°F | Ht 67.0 in | Wt 179.0 lb

## 2018-04-29 DIAGNOSIS — N400 Enlarged prostate without lower urinary tract symptoms: Secondary | ICD-10-CM

## 2018-04-29 DIAGNOSIS — Z8669 Personal history of other diseases of the nervous system and sense organs: Secondary | ICD-10-CM

## 2018-04-29 DIAGNOSIS — J019 Acute sinusitis, unspecified: Secondary | ICD-10-CM

## 2018-04-29 DIAGNOSIS — E7800 Pure hypercholesterolemia, unspecified: Secondary | ICD-10-CM | POA: Diagnosis not present

## 2018-04-29 DIAGNOSIS — C679 Malignant neoplasm of bladder, unspecified: Secondary | ICD-10-CM

## 2018-04-29 DIAGNOSIS — Z6828 Body mass index (BMI) 28.0-28.9, adult: Secondary | ICD-10-CM | POA: Diagnosis not present

## 2018-04-29 DIAGNOSIS — D692 Other nonthrombocytopenic purpura: Secondary | ICD-10-CM | POA: Diagnosis not present

## 2018-04-29 DIAGNOSIS — R011 Cardiac murmur, unspecified: Secondary | ICD-10-CM | POA: Diagnosis not present

## 2018-04-29 DIAGNOSIS — E039 Hypothyroidism, unspecified: Secondary | ICD-10-CM

## 2018-04-29 DIAGNOSIS — H6122 Impacted cerumen, left ear: Secondary | ICD-10-CM | POA: Diagnosis not present

## 2018-04-29 DIAGNOSIS — Z8551 Personal history of malignant neoplasm of bladder: Secondary | ICD-10-CM | POA: Diagnosis not present

## 2018-04-29 LAB — BMP8+EGFR
BUN/Creatinine Ratio: 12 (ref 10–24)
BUN: 14 mg/dL (ref 8–27)
CHLORIDE: 105 mmol/L (ref 96–106)
CO2: 23 mmol/L (ref 20–29)
Calcium: 9.1 mg/dL (ref 8.6–10.2)
Creatinine, Ser: 1.16 mg/dL (ref 0.76–1.27)
GFR calc Af Amer: 70 mL/min/{1.73_m2} (ref >59)
GFR calc non Af Amer: 61 mL/min/{1.73_m2} (ref >59)
GLUCOSE: 91 mg/dL (ref 65–99)
POTASSIUM: 4.5 mmol/L (ref 3.5–5.2)
SODIUM: 142 mmol/L (ref 134–144)

## 2018-04-29 LAB — CBC WITH DIFFERENTIAL/PLATELET
BASOS ABS: 0 10*3/uL (ref 0.0–0.2)
Basos: 1 %
EOS (ABSOLUTE): 0.1 10*3/uL (ref 0.0–0.4)
Eos: 1 %
Hematocrit: 40.9 % (ref 37.5–51.0)
Hemoglobin: 13.6 g/dL (ref 13.0–17.7)
IMMATURE GRANS (ABS): 0 10*3/uL (ref 0.0–0.1)
Immature Granulocytes: 0 %
LYMPHS: 18 %
Lymphocytes Absolute: 1.2 10*3/uL (ref 0.7–3.1)
MCH: 29.2 pg (ref 26.6–33.0)
MCHC: 33.3 g/dL (ref 31.5–35.7)
MCV: 88 fL (ref 79–97)
Monocytes Absolute: 0.5 10*3/uL (ref 0.1–0.9)
Monocytes: 7 %
NEUTROS ABS: 4.8 10*3/uL (ref 1.4–7.0)
Neutrophils: 73 %
PLATELETS: 217 10*3/uL (ref 150–450)
RBC: 4.66 x10E6/uL (ref 4.14–5.80)
RDW: 14.2 % (ref 12.3–15.4)
WBC: 6.5 10*3/uL (ref 3.4–10.8)

## 2018-04-29 LAB — HEPATIC FUNCTION PANEL
ALBUMIN: 4.3 g/dL (ref 3.5–4.8)
ALT: 17 IU/L (ref 0–44)
AST: 24 IU/L (ref 0–40)
Alkaline Phosphatase: 59 IU/L (ref 39–117)
BILIRUBIN TOTAL: 0.3 mg/dL (ref 0.0–1.2)
BILIRUBIN, DIRECT: 0.1 mg/dL (ref 0.00–0.40)
TOTAL PROTEIN: 6.4 g/dL (ref 6.0–8.5)

## 2018-04-29 LAB — LIPID PANEL
CHOLESTEROL TOTAL: 162 mg/dL (ref 100–199)
Chol/HDL Ratio: 2.6 ratio (ref 0.0–5.0)
HDL: 62 mg/dL (ref >39)
LDL Calculated: 88 mg/dL (ref 0–99)
Triglycerides: 61 mg/dL (ref 0–149)
VLDL Cholesterol Cal: 12 mg/dL (ref 5–40)

## 2018-04-29 LAB — URINE CULTURE

## 2018-04-29 LAB — THYROID PANEL WITH TSH
Free Thyroxine Index: 1.6 (ref 1.2–4.9)
T3 Uptake Ratio: 22 % — ABNORMAL LOW (ref 24–39)
T4, Total: 7.4 ug/dL (ref 4.5–12.0)
TSH: 4.1 u[IU]/mL (ref 0.450–4.500)

## 2018-04-29 LAB — VITAMIN D 25 HYDROXY (VIT D DEFICIENCY, FRACTURES): VIT D 25 HYDROXY: 58.3 ng/mL (ref 30.0–100.0)

## 2018-04-29 LAB — PSA, TOTAL AND FREE
PROSTATE SPECIFIC AG, SERUM: 1.3 ng/mL (ref 0.0–4.0)
PSA, Free Pct: 17.7 %
PSA, Free: 0.23 ng/mL

## 2018-04-29 MED ORDER — ATORVASTATIN CALCIUM 20 MG PO TABS: 20.0000 mg | ORAL_TABLET | Freq: Every day | ORAL | 3 refills | Status: DC

## 2018-04-29 MED ORDER — FLUTICASONE PROPIONATE 50 MCG/ACT NA SUSP: 1.0000 | Freq: Two times a day (BID) | NASAL | 11 refills | Status: DC | PRN

## 2018-04-29 NOTE — Progress Notes (Signed)
Subjective:    Patient ID: Anthony Fireman [name], male    DOB: 1941/03/26, 77 y.o.   MRN: [id number]  HPI Pt here for follow up and management of chronic medical problems which includes hyperlipidemia and hypothyroid. He is taking medication regularly.  Patient today is doing well with no specific complaints.  He sees the urologist regularly because of bladder cancer.  He is requesting refills on his Flonase and atorvastatin.  His vital signs are stable and his weight is actually down from the previous visit by 5 pounds and this is good.  He has had lab work done and we will review this with him during the visit today.  He will also need to get an EKG.  The CBC was within normal limits with a normal white blood cell count good hemoglobin and adequate platelet count.  The blood sugar creatinine and electrolytes were normal.  All cholesterol numbers were good and at goal with traditional lipid testing.  The vitamin D level was good at 58.3 and all liver function tests were normal.  TSH was good at 4.1 and the PSA was stable at 1.3.  The urinalysis was clear of infection and red blood cells.  The patient's history is remarkable in that he has bladder cancer and he had Guyon Barr syndrome several years ago.  He is on thyroid replacement a statin drug vitamin D replacement and continues to take multivitamin with iron.  And is doing well looks great and has no complaints.  He is active in the gym and plays golf regularly.  He denies any chest pain pressure tightness or shortness of breath.  He denies any reflux symptoms but does occasionally have these but rarely.  He denies any trouble with swallowing heartburn indigestion nausea vomiting diarrhea blood in the stool or black tarry bowel movements.  He is passing his water without problems.  He sees the urologist only once a year now in February and he continues to see Dr. Jeffie Pollock for this bladder cancer.  He has been very blessed to have had this cleared up.  He is  in need of getting an eye exam as his last ophthalmology visit was 3 years ago.  With his exercise and activity his weight is down and this is great.  His last colonoscopy was in 2017 and he was told by the gastroenterologist that he did not need any further colonoscopies.   Patient Active Problem List   Diagnosis Date Noted  . Systolic murmur 24/26/8341  . Bladder cancer ([place]) 04/06/2014  . History of Guillain-Barre syndrome 04/06/2014  . Personal history of colonic polyps 04/06/2014  . BPH (benign prostatic hyperplasia) 04/06/2014  . Erectile dysfunction 04/06/2014  . Hyperlipidemia 04/06/2014  . Occlusion and stenosis of carotid artery without mention of cerebral infarction 02/01/2014  . Hypothyroidism 03/30/2013  . BCG vaccine causing adverse effect in therapeutic use 02/06/2012  . Epididymitis 01/13/2012  . Carotid stenosis 07/25/2011   Outpatient Encounter Medications as of 04/29/2018  Medication Sig  . aspirin 81 MG EC tablet Take 81 mg by mouth every other day.   Marland Kitchen atorvastatin (LIPITOR) 20 MG tablet TAKE 1 TABLET BY MOUTH EVERY DAY  . BIOTIN 5000 PO Take 5,000 mg by mouth daily.  . cholecalciferol (VITAMIN D) 1000 units tablet Take 5,000 Units by mouth daily.   . Coenzyme Q10 (COQ10) 200 MG CAPS Take 1 capsule by mouth daily.  Marland Kitchen CRANBERRY PO Take 4,200 mg by mouth daily.  Marland Kitchen Fe  Fum-FePoly-FA-Vit C-Vit B3 (FOLIVANE-F) 125-1 MG CAPS TAKE 1 CAPSULE BY MOUTH EVERY DAY  . Flaxseed, Linseed, (FLAX SEED OIL PO) Take 1,000 mg by mouth daily.  . fluticasone (FLONASE) 50 MCG/ACT nasal spray Place 1 spray into both nostrils 2 (two) times daily as needed for allergies or rhinitis.  . hyoscyamine (LEVSIN SL) 0.125 MG SL tablet Place 1 tablet (0.125 mg total) under the tongue every 8 (eight) hours as needed.  Marland Kitchen levothyroxine (SYNTHROID, LEVOTHROID) 75 MCG tablet Take 1 tablet (75 mcg total) by mouth daily before breakfast.  . Magnesium 500 MG CAPS Take 1 capsule by mouth daily.  . Multiple  Vitamin (MULTIVITAMIN WITH MINERALS) TABS tablet Take 1 tablet by mouth daily.   No facility-administered encounter medications on file as of 04/29/2018.       Review of Systems  Constitutional: Negative.   HENT: Negative.   Eyes: Negative.   Respiratory: Negative.   Cardiovascular: Negative.   Gastrointestinal: Negative.   Endocrine: Negative.   Genitourinary: Negative.   Musculoskeletal: Negative.   Skin: Negative.   Allergic/Immunologic: Negative.   Neurological: Negative.   Hematological: Negative.   Psychiatric/Behavioral: Negative.        Objective:   Physical Exam  Constitutional: He is oriented to person, place, and time. He appears well-developed and well-nourished.  The patient is pleasant and relaxed and seems to be in very good spirits with his activity levels and lifestyle  HENT:  Head: Normocephalic and atraumatic.  Right Ear: External ear normal.  Left Ear: External ear normal.  Mouth/Throat: Oropharynx is clear and moist. No oropharyngeal exudate.  Nasal turbinate congestion  Eyes: Pupils are equal, round, and reactive to light. Conjunctivae and EOM are normal. Right eye exhibits no discharge. Left eye exhibits no discharge. No scleral icterus.  Patient has not had an eye exam in over 3 years and he was reminded this is important to get this done.  Neck: Normal range of motion. Neck supple. No thyromegaly present.  No bruits thyromegaly or anterior cervical adenopathy  Cardiovascular: Normal rate, regular rhythm and intact distal pulses.  Murmur heard. Patient has systolic ejection murmur at 60/min with a regular rate and rhythm  Pulmonary/Chest: Effort normal and breath sounds normal. He has no wheezes. He has no rales. He exhibits no tenderness.  Clear anteriorly and posteriorly and no axillary adenopathy  Abdominal: Soft. Bowel sounds are normal. He exhibits no mass. There is no tenderness.  No liver or spleen enlargement no epigastric tenderness no  bruits no inguinal adenopathy and no masses.  Genitourinary: Rectum normal and penis normal.  Genitourinary Comments: The prostate was minimally enlarged if any.  There were no rectal masses.  The external genitalia were within normal limits with no testicular masses and no inguinal hernias being palpated.  Musculoskeletal: Normal range of motion. He exhibits no edema.  Lymphadenopathy:    He has no cervical adenopathy.  Neurological: He is alert and oriented to person, place, and time. He has normal reflexes. No cranial nerve deficit.  Skin: Skin is warm and dry. No rash noted.  Psychiatric: He has a normal mood and affect. His behavior is normal. Judgment and thought content normal.  Mood affect and behavior normal.  Nursing note and vitals reviewed.   BP 124/60 (BP Location: Left Arm)   Pulse 67   Temp (!) 95.7 F (35.4 C) (Oral)   Ht 5\' 7"  (1.702 m)   Wt 179 lb (81.2 kg)   BMI 28.04 kg/m  EKG with results pending     Assessment & Plan:  1. Hypothyroidism (acquired) -All thyroid tests are good and the patient will continue with current treatment  2. Pure hypercholesterolemia -Continue with atorvastatin and as aggressive therapeutic lifestyle changes as possible including diet and exercise - EKG 12-Lead  3. Benign prostatic hyperplasia, unspecified whether lower urinary tract symptoms present -The prostate has minimal enlargement with no lumps or masses.  He is having no symptoms with voiding.  4. History of primary bladder cancer -The patient will continue to follow-up yearly with the urologist because of his bladder cancer.  5. History of Guillain-Barre syndrome   6. Acute rhinosinusitis -This is more allergic rhinitis than sinusitis.  He will continue with his regular use of Flonase. - fluticasone (FLONASE) 50 MCG/ACT nasal spray; Place 1 spray into both nostrils 2 (two) times daily as needed for allergies or rhinitis.  Dispense: 16 g; Refill: 11  7. Systolic  murmur -The murmur is still present grade 2/6 and he will continue with his current physical activity as he is having no problems with chest pain pressure tightness or shortness of breath.  8. Left ear impacted cerumen -Ear irrigation to remove cerumen  9. BMI 28.0-28.9,adult -Continue aggressive therapeutic lifestyle changes with all efforts through diet and exercise to reduce BMI closer to 25.  10.  Senile purpura -There are bruises with thin skin under both forearms.  11.  Bladder cancer -The patient will continue to be followed yearly by the urologist with cystoscopies.  Meds ordered this encounter  Medications  . fluticasone (FLONASE) 50 MCG/ACT nasal spray    Sig: Place 1 spray into both nostrils 2 (two) times daily as needed for allergies or rhinitis.    Dispense:  16 g    Refill:  11  . atorvastatin (LIPITOR) 20 MG tablet    Sig: Take 1 tablet (20 mg total) by mouth daily.    Dispense:  90 tablet    Refill:  3   Patient Instructions                       Medicare Annual Wellness Visit  [facility] and the medical providers at [facility] strive to bring you the best medical care.  In doing so we not only want to address your current medical conditions and concerns but also to detect new conditions early and prevent illness, disease and health-related problems.    Medicare offers a yearly Wellness Visit which allows our clinical staff to assess your need for preventative services including immunizations, lifestyle education, counseling to decrease risk of preventable diseases and screening for fall risk and other medical concerns.    This visit is provided free of charge (no copay) for all Medicare recipients. The clinical pharmacists at [facility] have begun to conduct these Wellness Visits which will also include a thorough review of all your medications.    As you primary medical provider recommend that you make an  appointment for your Annual Wellness Visit if you have not done so already this year.  You may set up this appointment before you leave today or you may call back ([phone]) and schedule an appointment.  Please make sure when you call that you mention that you are scheduling your Annual Wellness Visit with the clinical pharmacist so that the appointment may be made for the proper length of time.     Continue current medications. Continue good therapeutic lifestyle  changes which include good diet and exercise. Fall precautions discussed with patient. If an FOBT was given today- please return it to our front desk. If you are over 28 years old - you may need Prevnar 65 or the adult Pneumonia vaccine.  **Flu shots are available--- please call and schedule a FLU-CLINIC appointment**  After your visit with [place] today you will receive a survey in the mail or online from [employer] regarding your care with [place]. Please take a moment to fill this out. Your feedback is very important to [place] as you can help [place] better understand your patient needs as well as improve your experience and satisfaction. WE CARE ABOUT YOU!!!   Continue to follow-up yearly with the urologist Make sure that you get an eye exam and make sure they send [place] a hard copy of that report Stay active physically and watch her diet closely Continue with Flonase for allergic rhinitis and consider starting this at an earlier date rather than waiting until you have more allergy problems. Drink plenty of fluids and stay well-hydrated Consider doing a Cologuard in the future    Arrie Senate MD

## 2018-04-29 NOTE — Patient Instructions (Addendum)
Medicare Annual Wellness Visit  [facility] and the medical providers at [facility] strive to bring you the best medical care.  In doing so we not only want to address your current medical conditions and concerns but also to detect new conditions early and prevent illness, disease and health-related problems.    Medicare offers a yearly Wellness Visit which allows our clinical staff to assess your need for preventative services including immunizations, lifestyle education, counseling to decrease risk of preventable diseases and screening for fall risk and other medical concerns.    This visit is provided free of charge (no copay) for all Medicare recipients. The clinical pharmacists at [facility] have begun to conduct these Wellness Visits which will also include a thorough review of all your medications.    As you primary medical provider recommend that you make an appointment for your Annual Wellness Visit if you have not done so already this year.  You may set up this appointment before you leave today or you may call back ([phone]) and schedule an appointment.  Please make sure when you call that you mention that you are scheduling your Annual Wellness Visit with the clinical pharmacist so that the appointment may be made for the proper length of time.     Continue current medications. Continue good therapeutic lifestyle changes which include good diet and exercise. Fall precautions discussed with patient. If an FOBT was given today- please return it to our front desk. If you are over 54 years old - you may need Prevnar 68 or the adult Pneumonia vaccine.  **Flu shots are available--- please call and schedule a FLU-CLINIC appointment**  After your visit with [place] today you will receive a survey in the mail or online from [employer] regarding your care with [place]. Please take a moment to fill this out. Your feedback is very  important to [place] as you can help [place] better understand your patient needs as well as improve your experience and satisfaction. WE CARE ABOUT YOU!!!   Continue to follow-up yearly with the urologist Make sure that you get an eye exam and make sure they send [place] a hard copy of that report Stay active physically and watch her diet closely Continue with Flonase for allergic rhinitis and consider starting this at an earlier date rather than waiting until you have more allergy problems. Drink plenty of fluids and stay well-hydrated Consider doing a Cologuard in the future

## 2018-05-03 ENCOUNTER — Other Ambulatory Visit: Payer: Self-pay | Admitting: Nurse Practitioner

## 2018-05-14 DIAGNOSIS — H5200 Hypermetropia, unspecified eye: Secondary | ICD-10-CM | POA: Diagnosis not present

## 2018-05-14 DIAGNOSIS — H40003 Preglaucoma, unspecified, bilateral: Secondary | ICD-10-CM | POA: Diagnosis not present

## 2018-05-26 ENCOUNTER — Other Ambulatory Visit: Payer: Self-pay | Admitting: Family Medicine

## 2018-06-01 ENCOUNTER — Other Ambulatory Visit: Payer: Self-pay | Admitting: Family Medicine

## 2018-06-22 DIAGNOSIS — R69 Illness, unspecified: Secondary | ICD-10-CM | POA: Diagnosis not present

## 2018-09-03 DIAGNOSIS — R69 Illness, unspecified: Secondary | ICD-10-CM | POA: Diagnosis not present

## 2018-11-13 ENCOUNTER — Other Ambulatory Visit: Payer: Self-pay | Admitting: Family Medicine

## 2018-11-14 ENCOUNTER — Other Ambulatory Visit: Payer: Self-pay | Admitting: Family Medicine

## 2018-11-16 NOTE — Telephone Encounter (Signed)
OV 04/29/18 rtc 1 yr

## 2018-12-25 ENCOUNTER — Telehealth: Payer: Self-pay | Admitting: *Deleted

## 2018-12-25 NOTE — Telephone Encounter (Signed)
Left message requesting a call from this office unknown reason. No answer left message.

## 2018-12-31 DIAGNOSIS — L570 Actinic keratosis: Secondary | ICD-10-CM | POA: Diagnosis not present

## 2018-12-31 DIAGNOSIS — L300 Nummular dermatitis: Secondary | ICD-10-CM | POA: Diagnosis not present

## 2018-12-31 DIAGNOSIS — L821 Other seborrheic keratosis: Secondary | ICD-10-CM | POA: Diagnosis not present

## 2019-01-04 DIAGNOSIS — R69 Illness, unspecified: Secondary | ICD-10-CM | POA: Diagnosis not present

## 2019-01-29 DIAGNOSIS — R69 Illness, unspecified: Secondary | ICD-10-CM | POA: Diagnosis not present

## 2019-02-03 DIAGNOSIS — Z8551 Personal history of malignant neoplasm of bladder: Secondary | ICD-10-CM | POA: Diagnosis not present

## 2019-02-03 DIAGNOSIS — R69 Illness, unspecified: Secondary | ICD-10-CM | POA: Diagnosis not present

## 2019-02-04 DIAGNOSIS — R69 Illness, unspecified: Secondary | ICD-10-CM | POA: Diagnosis not present

## 2019-02-08 DIAGNOSIS — E119 Type 2 diabetes mellitus without complications: Secondary | ICD-10-CM | POA: Diagnosis not present

## 2019-02-08 DIAGNOSIS — H401221 Low-tension glaucoma, left eye, mild stage: Secondary | ICD-10-CM | POA: Diagnosis not present

## 2019-02-08 DIAGNOSIS — H401212 Low-tension glaucoma, right eye, moderate stage: Secondary | ICD-10-CM | POA: Diagnosis not present

## 2019-02-08 DIAGNOSIS — H2513 Age-related nuclear cataract, bilateral: Secondary | ICD-10-CM | POA: Diagnosis not present

## 2019-02-21 ENCOUNTER — Other Ambulatory Visit: Payer: Self-pay | Admitting: Family Medicine

## 2019-04-26 ENCOUNTER — Other Ambulatory Visit: Payer: Self-pay | Admitting: Family Medicine

## 2019-04-26 NOTE — Telephone Encounter (Signed)
OV 05/05/19

## 2019-05-05 ENCOUNTER — Ambulatory Visit: Payer: Medicare HMO | Admitting: Family Medicine

## 2019-05-11 ENCOUNTER — Other Ambulatory Visit: Payer: Self-pay | Admitting: Family Medicine

## 2019-05-11 DIAGNOSIS — J019 Acute sinusitis, unspecified: Secondary | ICD-10-CM

## 2019-05-16 ENCOUNTER — Other Ambulatory Visit: Payer: Self-pay | Admitting: Family Medicine

## 2019-05-20 ENCOUNTER — Other Ambulatory Visit: Payer: Self-pay

## 2019-05-20 ENCOUNTER — Other Ambulatory Visit: Payer: Medicare Other

## 2019-05-20 ENCOUNTER — Encounter: Payer: Self-pay | Admitting: Family Medicine

## 2019-05-20 ENCOUNTER — Telehealth: Payer: Self-pay | Admitting: Family Medicine

## 2019-05-20 ENCOUNTER — Ambulatory Visit (INDEPENDENT_AMBULATORY_CARE_PROVIDER_SITE_OTHER): Payer: Medicare Other | Admitting: Family Medicine

## 2019-05-20 VITALS — BP 121/67 | HR 66 | Temp 98.0°F | Ht 67.0 in | Wt 174.0 lb

## 2019-05-20 DIAGNOSIS — E7800 Pure hypercholesterolemia, unspecified: Secondary | ICD-10-CM

## 2019-05-20 DIAGNOSIS — R011 Cardiac murmur, unspecified: Secondary | ICD-10-CM | POA: Diagnosis not present

## 2019-05-20 DIAGNOSIS — Z125 Encounter for screening for malignant neoplasm of prostate: Secondary | ICD-10-CM | POA: Diagnosis not present

## 2019-05-20 DIAGNOSIS — E039 Hypothyroidism, unspecified: Secondary | ICD-10-CM

## 2019-05-20 DIAGNOSIS — Z0001 Encounter for general adult medical examination with abnormal findings: Secondary | ICD-10-CM | POA: Diagnosis not present

## 2019-05-20 DIAGNOSIS — Z0000 Encounter for general adult medical examination without abnormal findings: Secondary | ICD-10-CM

## 2019-05-20 LAB — URINALYSIS, COMPLETE
Bilirubin, UA: NEGATIVE
Glucose, UA: NEGATIVE
Ketones, UA: NEGATIVE
Leukocytes,UA: NEGATIVE
Nitrite, UA: NEGATIVE
Protein,UA: NEGATIVE
Specific Gravity, UA: >1.03 — ABNORMAL HIGH (ref 1.005–1.030)
Urobilinogen, Ur: 0.2 mg/dL (ref 0.2–1.0)
pH, UA: 5.5 (ref 5.0–7.5)

## 2019-05-20 LAB — MICROSCOPIC EXAMINATION
Epithelial Cells (non renal): NONE SEEN /hpf (ref 0–10)
Renal Epithel, UA: NONE SEEN /hpf
WBC, UA: NONE SEEN /hpf (ref 0–5)

## 2019-05-20 MED ORDER — FOLIVANE-F 125-1 MG PO CAPS: 1.0000 | ORAL_CAPSULE | Freq: Every day | ORAL | 3 refills | Status: DC

## 2019-05-20 MED ORDER — ATORVASTATIN CALCIUM 20 MG PO TABS: 20.0000 mg | ORAL_TABLET | Freq: Every day | ORAL | 3 refills | Status: DC

## 2019-05-20 MED ORDER — LEVOTHYROXINE SODIUM 75 MCG PO TABS: 75.0000 ug | ORAL_TABLET | Freq: Every day | ORAL | 3 refills | Status: DC

## 2019-05-20 MED ORDER — HYOSCYAMINE SULFATE 0.125 MG SL SUBL: 0.1250 mg | SUBLINGUAL_TABLET | Freq: Three times a day (TID) | SUBLINGUAL | 3 refills | Status: DC | PRN

## 2019-05-20 NOTE — Progress Notes (Signed)
BP 121/67   Pulse 66   Temp 98 F (36.7 C) (Oral)   Ht _0  (1.702 m)   Wt 174 lb (78.9 kg)   BMI 27.25 kg/m    Subjective:   Patient ID: Anthony Noble., male    DOB: 04-07-1941, 78 y.o.   MRN: [id number]  HPI: Anthony Noble. is a 78 y.o. male presenting on 05/20/2019 for Annual Exam (switching from [place])   HPI Adult well exam Patient is coming in for today for well adult exam and physical.  Patient has a known systolic murmur and history of hypothyroidism and cholesterol.  He is currently taking Lipitor and levothyroxine. Patient denies any chest pain, shortness of breath, headaches or vision issues, abdominal complaints, diarrhea, nausea, vomiting, or joint issues.   Relevant past medical, surgical, family and social history reviewed and updated as indicated. Interim medical history since our last visit reviewed. Allergies and medications reviewed and updated.  Review of Systems  Constitutional: Negative for chills and fever.  HENT: Negative for ear pain and tinnitus.   Eyes: Negative for pain and discharge.  Respiratory: Negative for cough, shortness of breath and wheezing.   Cardiovascular: Negative for chest pain, palpitations and leg swelling.  Gastrointestinal: Negative for abdominal pain, blood in stool, constipation and diarrhea.  Genitourinary: Negative for dysuria and hematuria.  Musculoskeletal: Negative for back pain, gait problem and myalgias.  Skin: Negative for rash.  Neurological: Negative for dizziness, weakness and headaches.  Psychiatric/Behavioral: Negative for suicidal ideas.  All other systems reviewed and are negative.   Per HPI unless specifically indicated above   Allergies as of 05/20/2019      Reactions   Bcg Live Other (See Comments)   Treatment pt had- shakes   Septra [bactrim] Other (See Comments)   Muscle weakness, numbness   Sulfa Antibiotics Other (See Comments)   Severe weakness   Pertussis Vaccines    History of  Gillian barrie      Medication List       Accurate as of May 20, 2019 10:25 AM. If you have any questions, ask your nurse or doctor.        STOP taking these medications   aspirin 81 MG EC tablet Stopped by: Worthy Rancher, MD   FLAX SEED OIL PO Stopped by: Fransisca Kaufmann Damontay Alred, MD     TAKE these medications   atorvastatin 20 MG tablet Commonly known as: LIPITOR Take 1 tablet (20 mg total) by mouth daily.   BIOTIN 5000 PO Take 5,000 mg by mouth daily.   cholecalciferol 1000 units tablet Commonly known as: VITAMIN D Take 5,000 Units by mouth daily.   CoQ10 200 MG Caps Take 1 capsule by mouth daily.   CRANBERRY PO Take 4,200 mg by mouth daily.   Fish Oil 1200 MG Caps Take by mouth.   fluticasone 50 MCG/ACT nasal spray Commonly known as: FLONASE USE 1 SPRAY(S) IN EACH NOSTRIL TWICE DAILY AS NEEDED FOR ALLERGIES OR  RHINITIS   Folivane-F 125-1 MG Caps Take 1 capsule by mouth daily.   guaiFENesin 600 MG 12 hr tablet Commonly known as: MUCINEX Take by mouth 2 (two) times daily.   hyoscyamine 0.125 MG SL tablet Commonly known as: LEVSIN SL Take 1 tablet (0.125 mg total) by mouth every 8 (eight) hours as needed. What changed: See the new instructions. Changed by: Fransisca Kaufmann Ellard Nan, MD   latanoprost 0.005 % ophthalmic solution Commonly known as: XALATAN Apply to eye.  levothyroxine 75 MCG tablet Commonly known as: Euthyrox Take 1 tablet (75 mcg total) by mouth daily before breakfast. What changed: See the new instructions. Changed by: Fransisca Kaufmann Niaya Hickok, MD   Magnesium 500 MG Caps Take 1 capsule by mouth daily.   multivitamin with minerals Tabs tablet Take 1 tablet by mouth daily.        Objective:   BP 121/67   Pulse 66   Temp 98 F (36.7 C) (Oral)   Ht _0  (1.702 m)   Wt 174 lb (78.9 kg)   BMI 27.25 kg/m   Wt Readings from Last 3 Encounters:  05/20/19 174 lb (78.9 kg)  04/29/18 179 lb (81.2 kg)  03/19/18 184 lb (83.5 kg)     Physical Exam Vitals signs and nursing note reviewed.  Constitutional:      General: He is not in acute distress.    Appearance: He is well-developed. He is not diaphoretic.  Eyes:     General: No scleral icterus.    Conjunctiva/sclera: Conjunctivae normal.  Neck:     Musculoskeletal: Neck supple.     Thyroid: No thyromegaly.  Cardiovascular:     Rate and Rhythm: Normal rate and regular rhythm.     Heart sounds: Normal heart sounds. No murmur.  Pulmonary:     Effort: Pulmonary effort is normal. No respiratory distress.     Breath sounds: Normal breath sounds. No wheezing.  Abdominal:     General: Abdomen is flat. Bowel sounds are normal. There is no distension.     Tenderness: There is no abdominal tenderness.  Genitourinary:    Prostate: Enlarged. Not tender and no nodules present.  Musculoskeletal: Normal range of motion.  Lymphadenopathy:     Cervical: No cervical adenopathy.  Skin:    General: Skin is warm and dry.     Findings: No rash.  Neurological:     Mental Status: He is alert and oriented to person, place, and time.     Coordination: Coordination normal.  Psychiatric:        Behavior: Behavior normal.     Urinalysis: 0-3 RBCs, otherwise negative  Assessment & Plan:   Problem List Items Addressed This Visit      Endocrine   Hypothyroidism   Relevant Medications   levothyroxine (EUTHYROX) 75 MCG tablet   Other Relevant Orders   TSH     Other   Hyperlipidemia   Relevant Medications   atorvastatin (LIPITOR) 20 MG tablet   Other Relevant Orders   Lipid panel   Systolic murmur    Other Visit Diagnoses    Well adult exam    -  Primary   Relevant Orders   Microalbumin / creatinine urine ratio   Urinalysis, Complete   CBC with Differential/Platelet   CMP14+EGFR   Lipid panel   TSH   PSA, total and free   Prostate cancer screening       Relevant Orders   Microalbumin / creatinine urine ratio   Urinalysis, Complete   PSA, total and free       Continue Lipitor and hyoscyamine for bowel issues, continue levothyroxine and will check blood work today.  Patient has a systolic murmur that has been evaluated previously and has not changed and denies any symptoms from it. Follow up plan: Return in about 1 year (around 05/19/2020), or if symptoms worsen or fail to improve, for Well physical.  Counseling provided for all of the vaccine components Orders Placed This Encounter  Procedures  .  Microalbumin / creatinine urine ratio  . Urinalysis, Complete  . CBC with Differential/Platelet  . CMP14+EGFR  . Lipid panel  . TSH  . PSA, total and free    Caryl Pina, MD [facility] 05/20/2019, 10:25 AM

## 2019-05-21 LAB — CBC WITH DIFFERENTIAL/PLATELET
Basophils Absolute: 0.1 10*3/uL (ref 0.0–0.2)
Basos: 1 %
EOS (ABSOLUTE): 0.1 10*3/uL (ref 0.0–0.4)
Eos: 2 %
Hematocrit: 41.8 % (ref 37.5–51.0)
Hemoglobin: 13.9 g/dL (ref 13.0–17.7)
Immature Grans (Abs): 0 10*3/uL (ref 0.0–0.1)
Immature Granulocytes: 0 %
Lymphocytes Absolute: 1.4 10*3/uL (ref 0.7–3.1)
Lymphs: 27 %
MCH: 29.7 pg (ref 26.6–33.0)
MCHC: 33.3 g/dL (ref 31.5–35.7)
MCV: 89 fL (ref 79–97)
Monocytes Absolute: 0.5 10*3/uL (ref 0.1–0.9)
Monocytes: 10 %
Neutrophils Absolute: 3 10*3/uL (ref 1.4–7.0)
Neutrophils: 60 %
Platelets: 216 10*3/uL (ref 150–450)
RBC: 4.68 x10E6/uL (ref 4.14–5.80)
RDW: 12.9 % (ref 11.6–15.4)
WBC: 5 10*3/uL (ref 3.4–10.8)

## 2019-05-21 LAB — CMP14+EGFR
ALT: 16 IU/L (ref 0–44)
AST: 20 IU/L (ref 0–40)
Albumin/Globulin Ratio: 2 (ref 1.2–2.2)
Albumin: 4.5 g/dL (ref 3.7–4.7)
Alkaline Phosphatase: 59 IU/L (ref 39–117)
BUN/Creatinine Ratio: 19 (ref 10–24)
BUN: 19 mg/dL (ref 8–27)
Bilirubin Total: 0.5 mg/dL (ref 0.0–1.2)
CO2: 22 mmol/L (ref 20–29)
Calcium: 9.4 mg/dL (ref 8.6–10.2)
Chloride: 101 mmol/L (ref 96–106)
Creatinine, Ser: 0.99 mg/dL (ref 0.76–1.27)
GFR calc Af Amer: 85 mL/min/{1.73_m2} (ref >59)
GFR calc non Af Amer: 73 mL/min/{1.73_m2} (ref >59)
Globulin, Total: 2.2 g/dL (ref 1.5–4.5)
Glucose: 95 mg/dL (ref 65–99)
Potassium: 4.4 mmol/L (ref 3.5–5.2)
Sodium: 141 mmol/L (ref 134–144)
Total Protein: 6.7 g/dL (ref 6.0–8.5)

## 2019-05-21 LAB — LIPID PANEL
Chol/HDL Ratio: 2.9 ratio (ref 0.0–5.0)
Cholesterol, Total: 191 mg/dL (ref 100–199)
HDL: 66 mg/dL (ref >39)
LDL Calculated: 111 mg/dL — ABNORMAL HIGH (ref 0–99)
Triglycerides: 69 mg/dL (ref 0–149)
VLDL Cholesterol Cal: 14 mg/dL (ref 5–40)

## 2019-05-21 LAB — MICROALBUMIN / CREATININE URINE RATIO
Creatinine, Urine: 254.9 mg/dL
Microalb/Creat Ratio: 5 mg/g creat (ref 0–29)
Microalbumin, Urine: 11.9 ug/mL

## 2019-05-21 LAB — PSA, TOTAL AND FREE
PSA, Free Pct: 19.3 %
PSA, Free: 0.27 ng/mL
Prostate Specific Ag, Serum: 1.4 ng/mL (ref 0.0–4.0)

## 2019-05-21 LAB — TSH: TSH: 1.67 u[IU]/mL (ref 0.450–4.500)

## 2019-06-10 ENCOUNTER — Telehealth: Payer: Self-pay | Admitting: Family Medicine

## 2019-06-10 NOTE — Telephone Encounter (Signed)
Wife aware of results. They are wanting to know when he should have a cartoid duplex again? Please advise

## 2019-06-11 ENCOUNTER — Other Ambulatory Visit: Payer: Self-pay | Admitting: Family Medicine

## 2019-06-11 DIAGNOSIS — I6523 Occlusion and stenosis of bilateral carotid arteries: Secondary | ICD-10-CM

## 2019-06-11 NOTE — Progress Notes (Signed)
Went ahead and ordered a repeat carotid ultrasound, looks like he was placed to have one a couple years ago but did not have the repeat. Caryl Pina, MD [facility] 06/11/2019, 4:16 PM

## 2019-06-11 NOTE — Telephone Encounter (Signed)
Please let the family know that I went ahead and ordered a repeat carotid ultrasound we will try and get that scheduled

## 2019-06-11 NOTE — Telephone Encounter (Signed)
Pt aware.

## 2019-06-16 DIAGNOSIS — H2513 Age-related nuclear cataract, bilateral: Secondary | ICD-10-CM | POA: Diagnosis not present

## 2019-06-16 DIAGNOSIS — H401221 Low-tension glaucoma, left eye, mild stage: Secondary | ICD-10-CM | POA: Diagnosis not present

## 2019-06-16 DIAGNOSIS — H401212 Low-tension glaucoma, right eye, moderate stage: Secondary | ICD-10-CM | POA: Diagnosis not present

## 2019-07-02 ENCOUNTER — Other Ambulatory Visit: Payer: Self-pay | Admitting: *Deleted

## 2019-07-02 DIAGNOSIS — I739 Peripheral vascular disease, unspecified: Secondary | ICD-10-CM

## 2019-07-02 NOTE — Progress Notes (Signed)
aa

## 2019-07-05 ENCOUNTER — Other Ambulatory Visit: Payer: Self-pay | Admitting: Family Medicine

## 2019-07-05 DIAGNOSIS — I6523 Occlusion and stenosis of bilateral carotid arteries: Secondary | ICD-10-CM

## 2019-07-05 NOTE — Progress Notes (Signed)
I have placed ultrasound order for carotids

## 2019-07-09 ENCOUNTER — Other Ambulatory Visit: Payer: Self-pay

## 2019-07-09 DIAGNOSIS — I739 Peripheral vascular disease, unspecified: Secondary | ICD-10-CM

## 2019-07-15 ENCOUNTER — Other Ambulatory Visit (HOSPITAL_COMMUNITY): Payer: 59

## 2019-08-05 DIAGNOSIS — H401221 Low-tension glaucoma, left eye, mild stage: Secondary | ICD-10-CM | POA: Diagnosis not present

## 2019-08-05 DIAGNOSIS — H401212 Low-tension glaucoma, right eye, moderate stage: Secondary | ICD-10-CM | POA: Diagnosis not present

## 2019-08-05 DIAGNOSIS — E119 Type 2 diabetes mellitus without complications: Secondary | ICD-10-CM | POA: Diagnosis not present

## 2019-08-05 DIAGNOSIS — H2513 Age-related nuclear cataract, bilateral: Secondary | ICD-10-CM | POA: Diagnosis not present

## 2019-08-17 ENCOUNTER — Other Ambulatory Visit: Payer: Self-pay

## 2019-08-17 DIAGNOSIS — I6523 Occlusion and stenosis of bilateral carotid arteries: Secondary | ICD-10-CM

## 2019-08-18 ENCOUNTER — Telehealth (HOSPITAL_COMMUNITY): Payer: Self-pay

## 2019-08-18 NOTE — Telephone Encounter (Signed)

## 2019-08-19 ENCOUNTER — Ambulatory Visit (INDEPENDENT_AMBULATORY_CARE_PROVIDER_SITE_OTHER): Payer: Medicare Other | Admitting: Family

## 2019-08-19 ENCOUNTER — Encounter: Payer: Self-pay | Admitting: Family

## 2019-08-19 ENCOUNTER — Other Ambulatory Visit: Payer: Self-pay

## 2019-08-19 ENCOUNTER — Ambulatory Visit (HOSPITAL_COMMUNITY)
Admission: RE | Admit: 2019-08-19 | Discharge: 2019-08-19 | Disposition: A | Discharge status: Home / Self Care | Payer: Medicare Other | Source: Ambulatory Visit | Attending: Family | Admitting: Family

## 2019-08-19 VITALS — BP 146/80 | HR 67 | Temp 97.7°F | Resp 16 | Ht 69.0 in | Wt 176.0 lb

## 2019-08-19 DIAGNOSIS — I6523 Occlusion and stenosis of bilateral carotid arteries: Secondary | ICD-10-CM | POA: Diagnosis not present

## 2019-08-19 NOTE — Progress Notes (Signed)
Chief Complaint: Follow up Extracranial Carotid Artery Stenosis   History of Present Illness  Anthony Kopitzke Tali Zipprich. is a 78 y.o. male whom Dr. Oneida Alar has been monitoring with known mild bilateral internal carotid artery stenosis. He returns today for routine surveillance.  He has not had previous carotid artery intervention. Works out 5 days/week for an hour, and golfs 3 days /week. He denies claudication type symptoms.  He has no history of TIA or stroke symptoms; he denies a history of amaurosis fugax or monocular blindness, unilateral facial drooping, hemiplegia, or receptive or expressive aphasia.   He denies any new medical or surgical history other than staring to be forgetful.   Diabetic: No Tobacco use: former smoker, quit in 1983  Pt meds include: Statin : yes, atrovastatin ASA: yes, 81 mg , 3 days/week Other anticoagulants/antiplatelets: no   Past Medical History:  Diagnosis Date  . Bilateral carotid artery stenosis without cerebral infarction    Asymptomatic---bilateral ICA <40%  and mild stenosis bilateral ECA  . BPH (benign prostatic hypertrophy) with urinary obstruction   . Cancer [facility]) 2004   bladder  . Diverticulosis of colon   . Guillain Barr syndrome ([place]) 2001  . Heart murmur March /  April 2016  . History of bladder cancer    first dx 2004--  tx with chemo bladder instillation  . History of colon polyps   . History of pleural effusion    2001  . History of vertebral fracture    2001--   T12  . Hyperlipidemia   . Hypothyroidism   . Pneumonia 2001  . Varicose veins     Social History Social History   Tobacco Use  . Smoking status: Former Smoker    Packs/day: 1.00    Years: 20.00    Pack years: 20.00    Types: Cigarettes    Quit date: 12/09/1981    Years since quitting: 37.7  . Smokeless tobacco: Never Used  Substance Use Topics  . Alcohol use: Yes    Alcohol/week: 2.0 standard drinks    Types: 1 Shots of liquor, 1 Standard  drinks or equivalent per week  . Drug use: No    Family History Family History  Problem Relation Age of Onset  . Other Mother        varicose veins  . Cancer Mother        Ovarian  . Varicose Veins Mother   . Nephrolithiasis Mother   . COPD Father   . Hyperlipidemia Father   . Cancer Maternal Grandmother   . Bipolar disorder Daughter   . Healthy Daughter   . Hypertension Sister   . Healthy Daughter   . Colon cancer Neg Hx     Surgical History Past Surgical History:  Procedure Laterality Date  . CYSTO/  BILATERAL RETROGRADE PYLOGRAM/  RESECTION BLADDER TUMOR  07-30-2011  . CYSTOSCOPY WITH BIOPSY N/A 01/12/2015   Procedure: CYSTOSCOPY WITH BLADDER  BIOPSY, FULGERATION;  Surgeon: Malka So, MD;  Location: [facility];  Service: Urology;  Laterality: N/A;  . NEGATIVE SLEEP STUDY  YRS AGO per pt  . ORIF RIGHT DISTAL RADIUS FX  12-31-2007  . TONSILLECTOMY  as child  . TRANSURETHRAL RESECTION OF BLADDER TUMOR WITH MITOMYCIN-C  06-07-2003  . VIDEO ASSISTED THORACOSCOPY (VATS)/EMPYEMA Right 05-01-2000   and Drainage of effusion    Allergies  Allergen Reactions  . Bcg Live Other (See Comments)    Treatment pt had- shakes  . Septra [Bactrim]  Other (See Comments)    Muscle weakness, numbness  . Sulfa Antibiotics Other (See Comments)    Severe weakness  . Pertussis Vaccines     History of Gillian barrie     Current Outpatient Medications  Medication Sig Dispense Refill  . atorvastatin (LIPITOR) 20 MG tablet Take 1 tablet (20 mg total) by mouth daily. 90 tablet 3  . BIOTIN 5000 PO Take 5,000 mg by mouth daily.    . cholecalciferol (VITAMIN D) 1000 units tablet Take 5,000 Units by mouth daily.     . Coenzyme Q10 (COQ10) 200 MG CAPS Take 1 capsule by mouth daily.    Marland Kitchen CRANBERRY PO Take 4,200 mg by mouth daily.    . Fe Fum-FePoly-FA-Vit C-Vit B3 (FOLIVANE-F) 125-1 MG CAPS Take 1 capsule by mouth daily. 90 capsule 3  . fluticasone (FLONASE) 50 MCG/ACT nasal  spray USE 1 SPRAY(S) IN EACH NOSTRIL TWICE DAILY AS NEEDED FOR ALLERGIES OR  RHINITIS 16 g 0  . guaiFENesin (MUCINEX) 600 MG 12 hr tablet Take by mouth 2 (two) times daily.    . hyoscyamine (LEVSIN SL) 0.125 MG SL tablet Take 1 tablet (0.125 mg total) by mouth every 8 (eight) hours as needed. 270 tablet 3  . latanoprost (XALATAN) 0.005 % ophthalmic solution Apply to eye.    . levothyroxine (EUTHYROX) 75 MCG tablet Take 1 tablet (75 mcg total) by mouth daily before breakfast. 90 tablet 3  . Magnesium 500 MG CAPS Take 1 capsule by mouth daily.    . Multiple Vitamin (MULTIVITAMIN WITH MINERALS) TABS tablet Take 1 tablet by mouth daily.    . Omega-3 Fatty Acids (FISH OIL) 1200 MG CAPS Take by mouth.     No current facility-administered medications for this visit.     Review of Systems : See HPI for pertinent positives and negatives.  Physical Examination  Vitals:   08/19/19 1425 08/19/19 1431  BP: 138/79 (!) 146/80  Pulse: 67 67  Resp: 16   Temp: 97.7 F (36.5 C)   TempSrc: Temporal   SpO2: 99%   Weight: 176 lb (79.8 kg)   Height: 5\' 9"  (1.753 m)    Body mass index is 25.99 kg/m.  General: WDWN fit appearing elderly male in NAD GAIT: normal Eyes: PERRLA HENT: No gross abnormalities.  Pulmonary:  Respirations are non-labored, good air movement in all fields, CTAB, no rales, rhonchi, or wheezes Cardiac: Regular rhythm, + low grade murmur.  VASCULAR EXAM Carotid Bruits Right Left   Negative Positive     Abdominal aortic pulse is not palpable. Radial pulses are 2+ palpable and equal.                                                                                                                            LE Pulses Right Left       POPLITEAL  not palpable   not palpable       POSTERIOR TIBIAL   palpable  palpable        DORSALIS PEDIS      ANTERIOR TIBIAL not palpable  not palpable    Gastrointestinal: soft, nontender, BS WNL, no r/g, no palpable  masses. Musculoskeletal: no muscle atrophy/wasting. M/S 5/5 throughout, extremities without ischemic changes Skin: No rashes, no ulcers, no cellulitis.   Neurologic:  A&O X 3; appropriate affect, sensation is normal; speech is normal, CN 2-12 intact, pain and light touch intact in extremities, motor exam as listed above. Psychiatric: Normal thought content, mood appropriate to clinical situation.    DATA  Right Carotid: Velocities in the right ICA are consistent with a 1-39% stenosis.                The ECA appears >50% stenosed. Left Carotid: Velocities in the left ICA are consistent with a 1-39% stenosis.               Non-hemodynamically significant plaque <50% noted in the CCA. The               ECA appears >50% stenosed. Vertebrals:  Bilateral vertebral arteries demonstrate antegrade flow. Subclavians: Normal flow hemodynamics were seen in bilateral subclavian arteries.   Assessment: Anthony Fireman Kaceton Jha. is a 78 y.o. male who has been monitored for minimal bilateral internal carotid artery stenosis. He has no history of stroke or TIA.   He walks 18 holes of golf, 3 days/week.   Today's carotid Duplex suggests bilateral internal carotid artery stenosis present in the 1%-39% range. Essentially unchanged since previous study on 02/01/2014 and as far back as August of 2012 (review of records).  He takes an 81 mg ASA 3 days/week (not daily due to bruising), and a daily statin.  He does not have DM and quit tobacco use in 1983.    Plan: Follow-up in 2 years with Carotid Duplex scan.   I discussed in depth with the patient the nature of atherosclerosis, and emphasized the importance of maximal medical management including strict control of blood pressure, blood glucose, and lipid levels, obtaining regular exercise, and continued cessation of smoking.  The patient is aware that without maximal medical management the underlying atherosclerotic disease process will progress, limiting  the benefit of any interventions. The patient was given information about stroke prevention and what symptoms should prompt the patient to seek immediate medical care. Thank you for allowing [place] to participate in this patient's care.  Clemon Chambers, RN, MSN, FNP-C [facility] Office: [phone]  Clinic Physician: Oneida Alar  08/19/19 3:19 PM

## 2019-08-19 NOTE — Patient Instructions (Signed)

## 2019-08-29 ENCOUNTER — Other Ambulatory Visit: Payer: Self-pay | Admitting: Family Medicine

## 2019-08-29 DIAGNOSIS — J019 Acute sinusitis, unspecified: Secondary | ICD-10-CM

## 2020-01-01 DIAGNOSIS — H2513 Age-related nuclear cataract, bilateral: Secondary | ICD-10-CM | POA: Diagnosis not present

## 2020-01-01 DIAGNOSIS — H40033 Anatomical narrow angle, bilateral: Secondary | ICD-10-CM | POA: Diagnosis not present

## 2020-01-11 ENCOUNTER — Telehealth: Payer: Self-pay | Admitting: Family Medicine

## 2020-01-11 NOTE — Telephone Encounter (Signed)
Yes that is the one situation where I would be concerned about getting the new vaccine, anybody with a history of Guillain-Barr probably should not, it is not an absolute contraindication but is the one time I would probably recommend to not to Caryl Pina, MD [facility] 01/11/2020, 3:55 PM

## 2020-01-11 NOTE — Telephone Encounter (Signed)
Patient aware and verbalized understanding. °

## 2020-02-10 DIAGNOSIS — Z8551 Personal history of malignant neoplasm of bladder: Secondary | ICD-10-CM | POA: Diagnosis not present

## 2020-02-15 ENCOUNTER — Telehealth: Payer: Self-pay | Admitting: Family Medicine

## 2020-02-15 NOTE — Chronic Care Management (AMB) (Signed)
  Chronic Care Management   Note  02/15/2020 Name: [facility] [name] MRN: [id number] DOB: 1941/06/05  Anthony Noble. is a 79 y.o. year old male who is a primary care patient of [place], Fransisca Kaufmann, MD. I reached out to [facility]. by phone today in response to a referral sent by Mr. Anthony Fireman Chain Jr.'s health plan.     Anthony Noble was given information about Chronic Care Management services today including:  1. CCM service includes personalized support from designated clinical staff supervised by his physician, including individualized plan of care and coordination with other care providers 2. 24/7 contact phone numbers for assistance for urgent and routine care needs. 3. Service will only be billed when office clinical staff spend 20 minutes or more in a month to coordinate care. 4. Only one practitioner may furnish and bill the service in a calendar month. 5. The patient may stop CCM services at any time (effective at the end of the month) by phone call to the office staff. 6. The patient will be responsible for cost sharing (co-pay) of up to 20% of the service fee (after annual deductible is met).  Patient agreed to services and verbal consent obtained.   Follow up plan: Telephone appointment with care management team member scheduled for: 05/02/2020  [facility]  [place], [place] [id number] Direct Dial: [email or web address] Website: [email or web address]

## 2020-03-09 DIAGNOSIS — D692 Other nonthrombocytopenic purpura: Secondary | ICD-10-CM | POA: Diagnosis not present

## 2020-03-09 DIAGNOSIS — L570 Actinic keratosis: Secondary | ICD-10-CM | POA: Diagnosis not present

## 2020-03-09 DIAGNOSIS — D225 Melanocytic nevi of trunk: Secondary | ICD-10-CM | POA: Diagnosis not present

## 2020-03-09 DIAGNOSIS — D2272 Melanocytic nevi of left lower limb, including hip: Secondary | ICD-10-CM | POA: Diagnosis not present

## 2020-03-09 DIAGNOSIS — L821 Other seborrheic keratosis: Secondary | ICD-10-CM | POA: Diagnosis not present

## 2020-05-02 ENCOUNTER — Ambulatory Visit (INDEPENDENT_AMBULATORY_CARE_PROVIDER_SITE_OTHER): Payer: Medicare Other | Admitting: *Deleted

## 2020-05-02 DIAGNOSIS — E7800 Pure hypercholesterolemia, unspecified: Secondary | ICD-10-CM | POA: Diagnosis not present

## 2020-05-02 DIAGNOSIS — C679 Malignant neoplasm of bladder, unspecified: Secondary | ICD-10-CM

## 2020-05-02 DIAGNOSIS — E039 Hypothyroidism, unspecified: Secondary | ICD-10-CM | POA: Diagnosis not present

## 2020-05-02 NOTE — Chronic Care Management (AMB) (Signed)
Chronic Care Management   Initial Visit Note  05/02/2020 Name: Anthony Noble. MRN: [id number] DOB: [birth year]  Referred by: Dettinger, Fransisca Kaufmann, MD Reason for referral : Chronic Care Management (Initial Visit)   Anthony Noble Aza Laue. is a 79 y.o. year old male who is a primary care patient of Dettinger, Fransisca Kaufmann, MD. The CCM team was consulted for assistance with chronic disease management and care coordination needs related to Hypothyroidism, HLD, Hx of bladder cancer, carotid artery stenosis.  Review of patient status, including review of consultants reports, relevant laboratory and other test results, and collaboration with appropriate care team members and the patient's provider was performed as part of comprehensive patient evaluation and provision of chronic care management services.    Subjective: I spoke with Mr Anthony Noble by telephone today regarding management of his chronic medical conditions. He is retired and lives at home with his wife. He is very active, going to the gym daily and playing golf 3 days a week. He feels that his chronic conditions are well controlled at this time but agrees to a follow-up call in a few months.   SDOH (Social Determinants of Health) assessments performed: Yes See Care Plan activities for detailed interventions related to SDOH     Objective: Outpatient Encounter Medications as of 05/02/2020  Medication Sig Note  . atorvastatin (LIPITOR) 20 MG tablet Take 1 tablet (20 mg total) by mouth daily.   Marland Kitchen BIOTIN 5000 PO Take 5,000 mg by mouth daily.   . cholecalciferol (VITAMIN D) 1000 units tablet Take 5,000 Units by mouth daily.    . Coenzyme Q10 (COQ10) 200 MG CAPS Take 1 capsule by mouth daily. 04/15/2016: 100 mg  . CRANBERRY PO Take 4,200 mg by mouth daily.   . Fe Fum-FePoly-FA-Vit C-Vit B3 (FOLIVANE-F) 125-1 MG CAPS Take 1 capsule by mouth daily.   . fluticasone (FLONASE) 50 MCG/ACT nasal spray USE 1 SPRAY(S) IN EACH NOSTRIL TWICE DAILY AS  NEEDED FOR ALLERGIES OR  RHINITIS   . guaiFENesin (MUCINEX) 600 MG 12 hr tablet Take by mouth 2 (two) times daily.   . hyoscyamine (LEVSIN SL) 0.125 MG SL tablet Take 1 tablet (0.125 mg total) by mouth every 8 (eight) hours as needed.   Marland Kitchen levothyroxine (EUTHYROX) 75 MCG tablet Take 1 tablet (75 mcg total) by mouth daily before breakfast.   . Magnesium 500 MG CAPS Take 1 capsule by mouth daily.   . Multiple Vitamin (MULTIVITAMIN WITH MINERALS) TABS tablet Take 1 tablet by mouth daily.   . Omega-3 Fatty Acids (FISH OIL) 1200 MG CAPS Take by mouth.    No facility-administered encounter medications on file as of 05/02/2020.     RN Care Plan  . Chronic Disease Management Needs       CARE PLAN ENTRY (see longtitudinal plan of care for additional care plan information)  Current Barriers:  . Chronic Disease Management support, education, and care coordination needs related to Hypothyroidism, HLD, Hx of bladder cancer, carotid artery stenosis  Clinical Goal(s) related to Hypothyroidism, HLD, Hx of bladder cancer, carotid artery stenosis:  Over the next 90 days, patient will:  . Work with the care management team to address educational, disease management, and care coordination needs  . Call provider office for new or worsened signs and symptoms  . Call care management team with questions or concerns . Verbalize basic understanding of patient centered plan of care established today  Interventions related to Hypothyroidism, HLD, Hx of bladder cancer, carotid artery  stenosis:  . Evaluation of current treatment plans and patient's adherence to plan as established by provider . Assessed patient understanding of disease states . Assessed patient's education and care coordination needs . Provided disease specific education to patient  . Collaborated with appropriate clinical care team members regarding patient needs . Provided with RN Care Manager contact number . Reviewed upcoming appt: Dr Warrick Parisian  06/15/20  Patient Self Care Activities related to Hypothyroidism, HLD, Hx of bladder cancer, carotid artery stenosis:  . Patient is able to perform ADLs and IADLs independently  Initial goal documentation        Plan:   The care management team will reach out to the patient again over the next 90 days.   Anthony Noble, BSN, RN-BC Embedded Chronic Care Manager [facility] / [place] [facility] Direct Dial: [phone]

## 2020-05-02 NOTE — Patient Instructions (Signed)
Visit Information  Goals Addressed            This Visit's Progress   . Chronic Disease Management Needs       CARE PLAN ENTRY (see longtitudinal plan of care for additional care plan information)  Current Barriers:  . Chronic Disease Management support, education, and care coordination needs related to Hypothyroidism, HLD, Hx of bladder cancer, carotid artery stenosis  Clinical Goal(s) related to Hypothyroidism, HLD, Hx of bladder cancer, carotid artery stenosis:  Over the next 90 days, patient will:  . Work with the care management team to address educational, disease management, and care coordination needs  . Call provider office for new or worsened signs and symptoms  . Call care management team with questions or concerns . Verbalize basic understanding of patient centered plan of care established today  Interventions related to Hypothyroidism, HLD, Hx of bladder cancer, carotid artery stenosis:  . Evaluation of current treatment plans and patient's adherence to plan as established by provider . Assessed patient understanding of disease states . Assessed patient's education and care coordination needs . Provided disease specific education to patient  . Collaborated with appropriate clinical care team members regarding patient needs . Provided with RN Care Manager contact number . Reviewed upcoming appt: Dr Warrick Parisian 06/15/20  Patient Self Care Activities related to Hypothyroidism, HLD, Hx of bladder cancer, carotid artery stenosis:  . Patient is able to perform ADLs and IADLs independently  Initial goal documentation        Mr. Vejar was given information about Chronic Care Management services today including:  1. CCM service includes personalized support from designated clinical staff supervised by his physician, including individualized plan of care and coordination with other care providers 2. 24/7 contact phone numbers for assistance for urgent and routine care  needs. 3. Service will only be billed when office clinical staff spend 20 minutes or more in a month to coordinate care. 4. Only one practitioner may furnish and bill the service in a calendar month. 5. The patient may stop CCM services at any time (effective at the end of the month) by phone call to the office staff. 6. The patient will be responsible for cost sharing (co-pay) of up to 20% of the service fee (after annual deductible is met).  Patient agreed to services and verbal consent obtained.   The patient verbalized understanding of instructions provided today and declined a print copy of patient instruction materials.   The care management team will reach out to the patient again over the next 90 days.   Chong Sicilian, BSN, RN-BC Embedded Chronic Care Manager [facility] / [place] [facility] Direct Dial: [phone]

## 2020-05-18 DIAGNOSIS — H401221 Low-tension glaucoma, left eye, mild stage: Secondary | ICD-10-CM | POA: Diagnosis not present

## 2020-05-18 DIAGNOSIS — H401212 Low-tension glaucoma, right eye, moderate stage: Secondary | ICD-10-CM | POA: Diagnosis not present

## 2020-05-18 DIAGNOSIS — H2513 Age-related nuclear cataract, bilateral: Secondary | ICD-10-CM | POA: Diagnosis not present

## 2020-05-29 ENCOUNTER — Other Ambulatory Visit: Payer: Self-pay | Admitting: Family Medicine

## 2020-06-09 ENCOUNTER — Other Ambulatory Visit: Payer: Self-pay | Admitting: Family Medicine

## 2020-06-13 ENCOUNTER — Other Ambulatory Visit: Payer: Self-pay

## 2020-06-13 ENCOUNTER — Other Ambulatory Visit: Payer: Medicare Other

## 2020-06-13 DIAGNOSIS — C679 Malignant neoplasm of bladder, unspecified: Secondary | ICD-10-CM | POA: Diagnosis not present

## 2020-06-13 DIAGNOSIS — E7800 Pure hypercholesterolemia, unspecified: Secondary | ICD-10-CM | POA: Diagnosis not present

## 2020-06-13 LAB — URINALYSIS, COMPLETE
Bilirubin, UA: NEGATIVE
Glucose, UA: NEGATIVE
Ketones, UA: NEGATIVE
Leukocytes,UA: NEGATIVE
Nitrite, UA: NEGATIVE
Protein,UA: NEGATIVE
RBC, UA: NEGATIVE
Specific Gravity, UA: 1.02 (ref 1.005–1.030)
Urobilinogen, Ur: 0.2 mg/dL (ref 0.2–1.0)
pH, UA: 6.5 (ref 5.0–7.5)

## 2020-06-13 LAB — MICROSCOPIC EXAMINATION
Bacteria, UA: NONE SEEN
Epithelial Cells (non renal): NONE SEEN /hpf (ref 0–10)
RBC, Urine: NONE SEEN /hpf (ref 0–2)
Renal Epithel, UA: NONE SEEN /hpf
WBC, UA: NONE SEEN /hpf (ref 0–5)

## 2020-06-14 LAB — CMP14+EGFR
ALT: 15 IU/L (ref 0–44)
AST: 26 IU/L (ref 0–40)
Albumin/Globulin Ratio: 2 (ref 1.2–2.2)
Albumin: 4.3 g/dL (ref 3.7–4.7)
Alkaline Phosphatase: 67 IU/L (ref 48–121)
BUN/Creatinine Ratio: 21 (ref 10–24)
BUN: 23 mg/dL (ref 8–27)
Bilirubin Total: 0.4 mg/dL (ref 0.0–1.2)
CO2: 23 mmol/L (ref 20–29)
Calcium: 9.5 mg/dL (ref 8.6–10.2)
Chloride: 101 mmol/L (ref 96–106)
Creatinine, Ser: 1.12 mg/dL (ref 0.76–1.27)
GFR calc Af Amer: 72 mL/min/{1.73_m2} (ref >59)
GFR calc non Af Amer: 63 mL/min/{1.73_m2} (ref >59)
Globulin, Total: 2.2 g/dL (ref 1.5–4.5)
Glucose: 91 mg/dL (ref 65–99)
Potassium: 4.7 mmol/L (ref 3.5–5.2)
Sodium: 140 mmol/L (ref 134–144)
Total Protein: 6.5 g/dL (ref 6.0–8.5)

## 2020-06-14 LAB — LIPID PANEL
Chol/HDL Ratio: 3.1 ratio (ref 0.0–5.0)
Cholesterol, Total: 183 mg/dL (ref 100–199)
HDL: 59 mg/dL (ref >39)
LDL Chol Calc (NIH): 110 mg/dL — ABNORMAL HIGH (ref 0–99)
Triglycerides: 73 mg/dL (ref 0–149)
VLDL Cholesterol Cal: 14 mg/dL (ref 5–40)

## 2020-06-14 LAB — CBC WITH DIFFERENTIAL/PLATELET
Basophils Absolute: 0.1 10*3/uL (ref 0.0–0.2)
Basos: 1 %
EOS (ABSOLUTE): 0.2 10*3/uL (ref 0.0–0.4)
Eos: 2 %
Hematocrit: 37.3 % — ABNORMAL LOW (ref 37.5–51.0)
Hemoglobin: 12.3 g/dL — ABNORMAL LOW (ref 13.0–17.7)
Immature Grans (Abs): 0 10*3/uL (ref 0.0–0.1)
Immature Granulocytes: 0 %
Lymphocytes Absolute: 1.1 10*3/uL (ref 0.7–3.1)
Lymphs: 13 %
MCH: 29.4 pg (ref 26.6–33.0)
MCHC: 33 g/dL (ref 31.5–35.7)
MCV: 89 fL (ref 79–97)
Monocytes Absolute: 0.7 10*3/uL (ref 0.1–0.9)
Monocytes: 8 %
Neutrophils Absolute: 6.4 10*3/uL (ref 1.4–7.0)
Neutrophils: 76 %
Platelets: 215 10*3/uL (ref 150–450)
RBC: 4.18 x10E6/uL (ref 4.14–5.80)
RDW: 12.6 % (ref 11.6–15.4)
WBC: 8.5 10*3/uL (ref 3.4–10.8)

## 2020-06-14 LAB — PSA, TOTAL AND FREE
PSA, Free Pct: 27.1 %
PSA, Free: 0.19 ng/mL
Prostate Specific Ag, Serum: 0.7 ng/mL (ref 0.0–4.0)

## 2020-06-15 ENCOUNTER — Encounter: Payer: Self-pay | Admitting: Family Medicine

## 2020-06-15 ENCOUNTER — Ambulatory Visit (INDEPENDENT_AMBULATORY_CARE_PROVIDER_SITE_OTHER): Payer: Medicare Other | Admitting: Family Medicine

## 2020-06-15 ENCOUNTER — Other Ambulatory Visit: Payer: Self-pay

## 2020-06-15 ENCOUNTER — Ambulatory Visit (INDEPENDENT_AMBULATORY_CARE_PROVIDER_SITE_OTHER): Payer: Medicare Other

## 2020-06-15 VITALS — BP 109/57 | HR 64 | Ht 69.0 in | Wt 177.0 lb

## 2020-06-15 DIAGNOSIS — M545 Low back pain, unspecified: Secondary | ICD-10-CM

## 2020-06-15 DIAGNOSIS — E7800 Pure hypercholesterolemia, unspecified: Secondary | ICD-10-CM | POA: Diagnosis not present

## 2020-06-15 DIAGNOSIS — I358 Other nonrheumatic aortic valve disorders: Secondary | ICD-10-CM | POA: Diagnosis not present

## 2020-06-15 DIAGNOSIS — M5136 Other intervertebral disc degeneration, lumbar region: Secondary | ICD-10-CM | POA: Diagnosis not present

## 2020-06-15 DIAGNOSIS — E039 Hypothyroidism, unspecified: Secondary | ICD-10-CM | POA: Diagnosis not present

## 2020-06-15 NOTE — Progress Notes (Signed)
BP (!) 109/57   Pulse 64   Ht 5' 9"  (1.753 m)   Wt 177 lb (80.3 kg)   SpO2 98%   BMI 26.14 kg/m    Subjective:   Patient ID: Anthony Monte., male    DOB: 02-18-1941, 79 y.o.   MRN: [id number]  HPI: Anthony Scerbo. is a 79 y.o. male presenting on 06/15/2020 for Medical Management of Chronic Issues   HPI Hypothyroidism recheck Patient is coming in for thyroid recheck today as well. They deny any issues with hair changes or heat or cold problems or diarrhea or constipation. They deny any chest pain or palpitations. They are currently on levothyroxine 75 micrograms   Hyperlipidemia Patient is coming in for recheck of his hyperlipidemia. The patient is currently taking fish oil and atorvastatin. They deny any issues with myalgias or history of liver damage from it. They deny any focal numbness or weakness or chest pain.   Patient comes in complaining of ringing in his ears is been going on over the past few years but has been increasing.  He denies any hearing loss that is noted.  He denies any pain or sinus pressure.  Patient has a murmur that has had for some time.  Patient also complains of worsening back pain over the past few months, he says had a couple small falls off a stool and it hurts around in the center of his back and it comes down either side sometimes.  Relevant past medical, surgical, family and social history reviewed and updated as indicated. Interim medical history since our last visit reviewed. Allergies and medications reviewed and updated.  Review of Systems  Constitutional: Negative for chills and fever.  HENT: Positive for tinnitus.   Eyes: Negative for discharge.  Respiratory: Negative for shortness of breath and wheezing.   Cardiovascular: Negative for chest pain and leg swelling.  Musculoskeletal: Positive for back pain. Negative for gait problem.  Skin: Negative for rash.  All other systems reviewed and are negative.   Per HPI unless  specifically indicated above   Allergies as of 06/15/2020      Reactions   Bcg Live Other (See Comments)   Treatment pt had- shakes   Septra [bactrim] Other (See Comments)   Muscle weakness, numbness   Sulfa Antibiotics Other (See Comments)   Severe weakness   Pertussis Vaccines    History of Gillian barrie      Medication List       Accurate as of June 15, 2020 10:19 AM. If you have any questions, ask your nurse or doctor.        STOP taking these medications   guaiFENesin 600 MG 12 hr tablet Commonly known as: [place] Stopped by: Fransisca Kaufmann Ibraham Levi, MD     TAKE these medications   atorvastatin 20 MG tablet Commonly known as: LIPITOR Take 1 tablet (20 mg total) by mouth daily.   BENADRYL ALLERGY PO Take by mouth as needed.   BIOTIN 5000 PO Take 5,000 mg by mouth daily.   cholecalciferol 1000 units tablet Commonly known as: VITAMIN D Take 5,000 Units by mouth daily.   CoQ10 200 MG Caps Take 1 capsule by mouth daily.   CRANBERRY PO Take 4,200 mg by mouth daily.   fexofenadine 180 MG tablet Commonly known as: ALLEGRA Take 180 mg by mouth daily.   Fish Oil 1200 MG Caps Take by mouth.   fluticasone 50 MCG/ACT nasal spray Commonly known as: FLONASE USE  1 SPRAY(S) IN EACH NOSTRIL TWICE DAILY AS NEEDED FOR ALLERGIES OR  RHINITIS   Folivane-F 125-1 MG Caps Take 1 capsule by mouth once daily   hyoscyamine 0.125 MG SL tablet Commonly known as: LEVSIN SL Take 1 tablet (0.125 mg total) by mouth every 8 (eight) hours as needed.   levothyroxine 75 MCG tablet Commonly known as: Euthyrox Take 1 tablet (75 mcg total) by mouth daily before breakfast. Needs to be seen before next refill   Magnesium 500 MG Caps Take 1 capsule by mouth daily.   multivitamin with minerals Tabs tablet Take 1 tablet by mouth daily.        Objective:   BP (!) 109/57   Pulse 64   Ht 5' 9"  (1.753 m)   Wt 177 lb (80.3 kg)   SpO2 98%   BMI 26.14 kg/m   Wt Readings from Last 3  Encounters:  06/15/20 177 lb (80.3 kg)  08/19/19 176 lb (79.8 kg)  05/20/19 174 lb (78.9 kg)    Physical Exam Vitals and nursing note reviewed.  Constitutional:      General: He is not in acute distress.    Appearance: He is well-developed. He is not diaphoretic.  HENT:     Right Ear: Tympanic membrane and ear canal normal.     Left Ear: Tympanic membrane and ear canal normal.  Eyes:     General: No scleral icterus.    Conjunctiva/sclera: Conjunctivae normal.  Neck:     Thyroid: No thyromegaly.  Cardiovascular:     Rate and Rhythm: Normal rate and regular rhythm.     Heart sounds: Murmur (Systolic blood 3 out of 6 best heard in the aortic region) heard.   Pulmonary:     Effort: Pulmonary effort is normal. No respiratory distress.     Breath sounds: Normal breath sounds. No wheezing.  Musculoskeletal:        General: No tenderness (No tenderness on exam). Normal range of motion.     Cervical back: Neck supple.  Lymphadenopathy:     Cervical: No cervical adenopathy.  Skin:    General: Skin is warm and dry.     Findings: No rash.  Neurological:     Mental Status: He is alert and oriented to person, place, and time.     Coordination: Coordination normal.  Psychiatric:        Behavior: Behavior normal.     Results for orders placed or performed in visit on 06/13/20  Microscopic Examination   Urine  Result Value Ref Range   WBC, UA None seen 0 - 5 /hpf   RBC None seen 0 - 2 /hpf   Epithelial Cells (non renal) None seen 0 - 10 /hpf   Renal Epithel, UA None seen None seen /hpf   Bacteria, UA None seen None seen/Few  CBC with Differential/Platelet  Result Value Ref Range   WBC 8.5 3.4 - 10.8 x10E3/uL   RBC 4.18 4.14 - 5.80 x10E6/uL   Hemoglobin 12.3 (L) 13.0 - 17.7 g/dL   Hematocrit 37.3 (L) 37.5 - 51.0 %   MCV 89 79 - 97 fL   MCH 29.4 26.6 - 33.0 pg   MCHC 33.0 31 - 35 g/dL   RDW 12.6 11.6 - 15.4 %   Platelets 215 150 - 450 x10E3/uL   Neutrophils 76 Not Estab. %     Lymphs 13 Not Estab. %   Monocytes 8 Not Estab. %   Eos 2 Not Estab. %  Basos 1 Not Estab. %   Neutrophils Absolute 6.4 1 - 7 x10E3/uL   Lymphocytes Absolute 1.1 0 - 3 x10E3/uL   Monocytes Absolute 0.7 0 - 0 x10E3/uL   EOS (ABSOLUTE) 0.2 0.0 - 0.4 x10E3/uL   Basophils Absolute 0.1 0 - 0 x10E3/uL   Immature Granulocytes 0 Not Estab. %   Immature Grans (Abs) 0.0 0.0 - 0.1 x10E3/uL  CMP14+EGFR  Result Value Ref Range   Glucose 91 65 - 99 mg/dL   BUN 23 8 - 27 mg/dL   Creatinine, Ser 1.12 0.76 - 1.27 mg/dL   GFR calc non Af Amer 63 >59 mL/min/1.73   GFR calc Af Amer 72 >59 mL/min/1.73   BUN/Creatinine Ratio 21 10 - 24   Sodium 140 134 - 144 mmol/L   Potassium 4.7 3.5 - 5.2 mmol/L   Chloride 101 96 - 106 mmol/L   CO2 23 20 - 29 mmol/L   Calcium 9.5 8.6 - 10.2 mg/dL   Total Protein 6.5 6.0 - 8.5 g/dL   Albumin 4.3 3.7 - 4.7 g/dL   Globulin, Total 2.2 1.5 - 4.5 g/dL   Albumin/Globulin Ratio 2.0 1.2 - 2.2   Bilirubin Total 0.4 0.0 - 1.2 mg/dL   Alkaline Phosphatase 67 48 - 121 IU/L   AST 26 0 - 40 IU/L   ALT 15 0 - 44 IU/L  Lipid panel  Result Value Ref Range   Cholesterol, Total 183 100 - 199 mg/dL   Triglycerides 73 0 - 149 mg/dL   HDL 59 >39 mg/dL   VLDL Cholesterol Cal 14 5 - 40 mg/dL   LDL Chol Calc (NIH) 110 (H) 0 - 99 mg/dL   Chol/HDL Ratio 3.1 0.0 - 5.0 ratio  Urinalysis, Complete  Result Value Ref Range   Specific Gravity, UA 1.020 1.005 - 1.030   pH, UA 6.5 5.0 - 7.5   Color, UA Yellow Yellow   Appearance Ur Clear Clear   Leukocytes,UA Negative Negative   Protein,UA Negative Negative/Trace   Glucose, UA Negative Negative   Ketones, UA Negative Negative   RBC, UA Negative Negative   Bilirubin, UA Negative Negative   Urobilinogen, Ur 0.2 0.2 - 1.0 mg/dL   Nitrite, UA Negative Negative   Microscopic Examination See below:   PSA, total and free  Result Value Ref Range   Prostate Specific Ag, Serum 0.7 0.0 - 4.0 ng/mL   PSA, Free 0.19 N/A ng/mL   PSA, Free  Pct 27.1 %    Lumbar x-ray: Await final read from radiology Assessment & Plan:   Problem List Items Addressed This Visit      Endocrine   Hypothyroidism     Other   Hyperlipidemia - Primary    Other Visit Diagnoses    Aortic systolic murmur on examination       Relevant Orders   ECHOCARDIOGRAM COMPLETE   Lumbar back pain       Relevant Orders   DG Lumbar Spine 2-3 Views      Will do x-ray for back pain, will also order echocardiogram for murmur has never had it.  Discussed ringing in ears and likely due to upper level hearing loss. Follow up plan: Return in about 1 year (around 06/15/2021), or if symptoms worsen or fail to improve, for Physical exam and recheck of chronic issues.  Counseling provided for all of the vaccine components Orders Placed This Encounter  Procedures  . DG Lumbar Spine 2-3 Views  . ECHOCARDIOGRAM COMPLETE  Caryl Pina, MD [facility] 06/15/2020, 10:19 AM

## 2020-06-18 ENCOUNTER — Other Ambulatory Visit: Payer: Self-pay | Admitting: Family Medicine

## 2020-06-26 ENCOUNTER — Other Ambulatory Visit: Payer: Self-pay | Admitting: Family Medicine

## 2020-06-29 ENCOUNTER — Other Ambulatory Visit: Payer: Self-pay

## 2020-06-29 ENCOUNTER — Ambulatory Visit (HOSPITAL_COMMUNITY)
Admission: RE | Admit: 2020-06-29 | Discharge: 2020-06-29 | Disposition: A | Discharge status: Home / Self Care | Payer: Medicare Other | Source: Ambulatory Visit | Attending: Family Medicine | Admitting: Family Medicine

## 2020-06-29 DIAGNOSIS — I358 Other nonrheumatic aortic valve disorders: Secondary | ICD-10-CM | POA: Diagnosis not present

## 2020-06-29 LAB — ECHOCARDIOGRAM COMPLETE
AR max vel: 0.78 cm2
AV Area VTI: 0.85 cm2
AV Area mean vel: 0.73 cm2
AV Mean grad: 33 mmHg
AV Peak grad: 47 mmHg
Ao pk vel: 3.43 m/s
Area-P 1/2: 2.54 cm2
P 1/2 time: 426 msec
S' Lateral: 2.33 cm

## 2020-06-29 NOTE — Progress Notes (Signed)
*  PRELIMINARY RESULTS* Echocardiogram 2D Echocardiogram has been performed Anthony Noble, RDCS.  Anthony Noble 06/29/2020, 11:39 AM

## 2020-07-27 ENCOUNTER — Ambulatory Visit: Payer: Medicare Other | Admitting: *Deleted

## 2020-07-27 DIAGNOSIS — E7800 Pure hypercholesterolemia, unspecified: Secondary | ICD-10-CM

## 2020-07-27 DIAGNOSIS — E039 Hypothyroidism, unspecified: Secondary | ICD-10-CM

## 2020-07-27 NOTE — Patient Instructions (Signed)
Carrieanne Kleen, BSN, RN-BC Embedded Chronic Care Manager [facility] / [facility] Care Management Direct Dial: [phone]    

## 2020-07-27 NOTE — Chronic Care Management (AMB) (Signed)
  Chronic Care Management   Follow Up Note   07/27/2020 Name: [facility] [name] MRN: [id number] DOB: 1941-01-28  Referred by: Anthony Noble, Anthony Kaufmann, MD Reason for referral : Chronic Care Management (RN follow up)   Anthony Noble Anthony Noble. is a 79 y.o. year old male who is a primary care patient of Anthony Noble, Anthony Kaufmann, MD. The CCM team was consulted for assistance with chronic disease management and care coordination needs.    Review of patient status, including review of consultants reports, relevant laboratory and other test results, and collaboration with appropriate care team members and the patient's provider was performed as part of comprehensive patient evaluation and provision of chronic care management services.    SDOH (Social Determinants of Health) assessments performed: No See Care Plan activities for detailed interventions related to [facility])    I spoke with Anthony Noble today by telephone regarding management of his chronic medical conditions. Per my last visit, he and his wife did not have any resource or CCM needs and Anthony Noble confirmed that that is still true for both of them. He appreciated the outreach but it was decided that they did not need CCM services at this. Advised that they can be added back at any time the need arises.    Plan:  CCM enrollment status changed to "previously enrolled" as per patient request on 07/27/20 to discontinue enrollment. Case closed to case management services in primary care home.    Anthony Noble, BSN, RN-BC Embedded Chronic Care Manager [facility] / [place] [facility] Direct Dial: [phone]

## 2020-07-31 ENCOUNTER — Telehealth: Payer: Self-pay | Admitting: Family Medicine

## 2020-08-07 MED ORDER — ATORVASTATIN CALCIUM 20 MG PO TABS
20.0000 mg | ORAL_TABLET | Freq: Every day | ORAL | 1 refills | Status: DC
Start: 2020-08-07 — End: 2020-12-04

## 2020-08-07 NOTE — Telephone Encounter (Signed)
Form has not been received. Labwork was done in July will sent Rx to [facility]

## 2020-09-07 ENCOUNTER — Other Ambulatory Visit: Payer: Self-pay | Admitting: Family Medicine

## 2020-09-09 ENCOUNTER — Encounter: Payer: Self-pay | Admitting: Family Medicine

## 2020-09-12 ENCOUNTER — Other Ambulatory Visit: Payer: Self-pay | Admitting: *Deleted

## 2020-09-13 MED ORDER — LEVOTHYROXINE SODIUM 75 MCG PO TABS
75.0000 ug | ORAL_TABLET | Freq: Every day | ORAL | 0 refills | Status: DC
Start: 2020-09-13 — End: 2020-11-01

## 2020-10-31 ENCOUNTER — Other Ambulatory Visit: Payer: Self-pay | Admitting: Family Medicine

## 2020-11-16 ENCOUNTER — Other Ambulatory Visit: Payer: Self-pay | Admitting: Family Medicine

## 2020-11-28 ENCOUNTER — Other Ambulatory Visit: Payer: Self-pay

## 2020-11-28 DIAGNOSIS — I6523 Occlusion and stenosis of bilateral carotid arteries: Secondary | ICD-10-CM

## 2020-12-03 ENCOUNTER — Other Ambulatory Visit: Payer: Self-pay | Admitting: Family Medicine

## 2020-12-09 DIAGNOSIS — R011 Cardiac murmur, unspecified: Secondary | ICD-10-CM

## 2020-12-09 HISTORY — DX: Cardiac murmur, unspecified: R01.1

## 2020-12-12 ENCOUNTER — Other Ambulatory Visit: Payer: Self-pay | Admitting: *Deleted

## 2020-12-12 MED ORDER — FOLIVANE-F 125-1 MG PO CAPS: 1.0000 | ORAL_CAPSULE | Freq: Every day | ORAL | 5 refills | Status: DC

## 2020-12-14 ENCOUNTER — Ambulatory Visit (INDEPENDENT_AMBULATORY_CARE_PROVIDER_SITE_OTHER): Payer: Medicare Other | Admitting: Physician Assistant

## 2020-12-14 ENCOUNTER — Other Ambulatory Visit: Payer: Self-pay

## 2020-12-14 ENCOUNTER — Ambulatory Visit (HOSPITAL_COMMUNITY)
Admission: RE | Admit: 2020-12-14 | Discharge: 2020-12-14 | Disposition: A | Discharge status: Home / Self Care | Payer: Medicare Other | Source: Ambulatory Visit | Attending: Vascular Surgery | Admitting: Vascular Surgery

## 2020-12-14 VITALS — BP 141/75 | HR 61 | Temp 96.9°F | Resp 16 | Ht 69.0 in | Wt 180.4 lb

## 2020-12-14 DIAGNOSIS — I6523 Occlusion and stenosis of bilateral carotid arteries: Secondary | ICD-10-CM | POA: Insufficient documentation

## 2020-12-14 NOTE — Progress Notes (Signed)
History of Present Illness:  Patient is a 80 y.o. year old male who presents for evaluation of asymptomatic carotid stenosis.  The patient denies symptoms of TIA, amaurosis, or stroke. He continues to take a daily Statin and stays active.    Past Medical History:  Diagnosis Date  . Bilateral carotid artery stenosis without cerebral infarction    Asymptomatic---bilateral ICA <40%  and mild stenosis bilateral ECA  . BPH (benign prostatic hypertrophy) with urinary obstruction   . Cancer [facility]) 2004   bladder  . Diverticulosis of colon   . Glaucoma   . Guillain Barr syndrome ([place]) 2001  . Heart murmur March /  April 2016  . History of bladder cancer    first dx 2004--  tx with chemo bladder instillation  . History of colon polyps   . History of pleural effusion    2001  . History of vertebral fracture    2001--   T12  . Hyperlipidemia   . Hypothyroidism   . Pneumonia 2001  . Varicose veins     Past Surgical History:  Procedure Laterality Date  . CYSTO/  BILATERAL RETROGRADE PYLOGRAM/  RESECTION BLADDER TUMOR  07-30-2011  . CYSTOSCOPY WITH BIOPSY N/A 01/12/2015   Procedure: CYSTOSCOPY WITH BLADDER  BIOPSY, FULGERATION;  Surgeon: Malka So, MD;  Location[facility];  Service: Urology;  Laterality: N/A;  . NEGATIVE SLEEP STUDY  YRS AGO per pt  . ORIF RIGHT DISTAL RADIUS FX  12-31-2007  . TONSILLECTOMY  as child  . TRANSURETHRAL RESECTION OF BLADDER TUMOR WITH MITOMYCIN-C  06-07-2003  . VIDEO ASSISTED THORACOSCOPY (VATS)/EMPYEMA Right 05-01-2000   and Drainage of effusion     Social History Social History   Tobacco Use  . Smoking status: Former Smoker    Packs/day: 1.00    Years: 20.00    Pack years: 20.00    Types: Cigarettes    Quit date: 12/09/1981    Years since quitting: 39.0  . Smokeless tobacco: Never Used  Vaping Use  . Vaping Use: Never used  Substance Use Topics  . Alcohol use: Yes    Alcohol/week: 2.0 standard drinks    Types: 1  Shots of liquor, 1 Standard drinks or equivalent per week  . Drug use: No    Family History Family History  Problem Relation Age of Onset  . Other Mother        varicose veins  . Cancer Mother        Ovarian  . Varicose Veins Mother   . Nephrolithiasis Mother   . COPD Father   . Hyperlipidemia Father   . Cancer Maternal Grandmother   . Bipolar disorder Daughter   . Healthy Daughter   . Hypertension Sister   . Healthy Daughter   . Colon cancer Neg Hx     Allergies  Allergies  Allergen Reactions  . Bcg Live Other (See Comments)    Treatment pt had- shakes  . Septra [Bactrim] Other (See Comments)    Muscle weakness, numbness  . Sulfa Antibiotics Other (See Comments)    Severe weakness  . Pertussis Vaccines     History of Gillian barrie      Current Outpatient Medications  Medication Sig Dispense Refill  . atorvastatin (LIPITOR) 20 MG tablet TAKE 1 TABLET BY MOUTH  DAILY 90 tablet 1  . BIOTIN 5000 PO Take 5,000 mg by mouth daily.    . cholecalciferol (VITAMIN D) 1000 units tablet  Take 5,000 Units by mouth daily.     . Coenzyme Q10 (COQ10) 200 MG CAPS Take 1 capsule by mouth daily.    Marland Kitchen CRANBERRY PO Take 4,200 mg by mouth daily.    . diphenhydrAMINE HCl (BENADRYL ALLERGY PO) Take by mouth as needed.    . Fe Fum-FePoly-FA-Vit C-Vit B3 (FOLIVANE-F) 125-1 MG CAPS Take 1 capsule by mouth daily. 30 capsule 5  . fexofenadine (ALLEGRA) 180 MG tablet Take 180 mg by mouth daily.    . hyoscyamine (LEVSIN SL) 0.125 MG SL tablet DISSOLVE 1 TABLET IN MOUTH EVERY 8 HOURS AS NEEDED 120 tablet 0  . levothyroxine (SYNTHROID) 75 MCG tablet TAKE 1 TABLET BY MOUTH  DAILY BEFORE BREAKFAST .  EQUIVALENT TO EUTHYROX 90 tablet 1  . Magnesium 500 MG CAPS Take 1 capsule by mouth daily.    . Multiple Vitamin (MULTIVITAMIN WITH MINERALS) TABS tablet Take 1 tablet by mouth daily.    . Omega-3 Fatty Acids (FISH OIL) 1200 MG CAPS Take by mouth.    . fluticasone (FLONASE) 50 MCG/ACT nasal spray USE  1 SPRAY(S) IN EACH NOSTRIL TWICE DAILY AS NEEDED FOR ALLERGIES OR  RHINITIS (Patient not taking: Reported on 12/14/2020) 48 g 1   No current facility-administered medications for this visit.    ROS:   General:  No weight loss, Fever, chills  HEENT: No recent headaches, no nasal bleeding, no visual changes, no sore throat  Neurologic: No dizziness, blackouts, seizures. No recent symptoms of stroke or mini- stroke. No recent episodes of slurred speech, or temporary blindness.  Cardiac: No recent episodes of chest pain/pressure, no shortness of breath at rest.  No shortness of breath with exertion.  Denies history of atrial fibrillation or irregular heartbeat  Vascular: No history of rest pain in feet.  No history of claudication.  No history of non-healing ulcer, No history of DVT   Pulmonary: No home oxygen, no productive cough, no hemoptysis,  No asthma or wheezing  Musculoskeletal:  [ ]  Arthritis, [ ]  Low back pain,  [ ]  Joint pain  Hematologic:No history of hypercoagulable state.  No history of easy bleeding.  No history of anemia  Gastrointestinal: No hematochezia or melena,  No gastroesophageal reflux, no trouble swallowing  Urinary: [ ]  chronic Kidney disease, [ ]  on HD - [ ]  MWF or [ ]  TTHS, [ ]  Burning with urination, [ ]  Frequent urination, [ ]  Difficulty urinating;   Skin: No rashes  Psychological: No history of anxiety,  No history of depression   Physical Examination  Vitals:   12/14/20 1355  BP: (!) 141/75  Pulse: 61  Resp: 16  Temp: (!) 96.9 F (36.1 C)  TempSrc: Temporal  SpO2: 98%  Weight: 180 lb 6.4 oz (81.8 kg)  Height: 5\' 9"  (1.753 m)    Body mass index is 26.64 kg/m.  General:  Alert and oriented, no acute distress HEENT: Normal Neck: No bruit or JVD Pulmonary: Clear to auscultation bilaterally Cardiac: Regular Rate and Rhythm without murmur Gastrointestinal: Soft, non-tender, non-distended, no mass, no scars Skin: No rash Extremity Pulses:   2+ radial, brachial, femoral, dorsalis pedis, posterior tibial pulses bilaterally Musculoskeletal: No deformity or edema  Neurologic: Upper and lower extremity motor 5/5 and symmetric  DATA:     Right Carotid Findings:  +----------+--------+--------+--------+---------------------+--------+       PSV cm/sEDV cm/sStenosisPlaque Description  Comments  +----------+--------+--------+--------+---------------------+--------+  CCA Prox 74   15   <50%  irregular and diffuse      +----------+--------+--------+--------+---------------------+--------+  CCA Mid  58   14   <50%  smooth              +----------+--------+--------+--------+---------------------+--------+  CCA Distal67   20                        +----------+--------+--------+--------+---------------------+--------+  ICA Prox 110   27   1-39%  calcific             +----------+--------+--------+--------+---------------------+--------+  ICA Mid  110   33                        +----------+--------+--------+--------+---------------------+--------+  ICA Distal101   32                        +----------+--------+--------+--------+---------------------+--------+  ECA    401   0    >50%  calcific             +----------+--------+--------+--------+---------------------+--------+   +----------+--------+-------+----------------+-------------------+       PSV cm/sEDV cmsDescribe    Arm Pressure (mmHG)  +----------+--------+-------+----------------+-------------------+  [id number]   0   Multiphasic, WNL            +----------+--------+-------+----------------+-------------------+   +---------+--------+--+--------+--+---------+  VertebralPSV cm/s36EDV cm/s11Antegrade   +---------+--------+--+--------+--+---------+      Left Carotid Findings:  +----------+--------+--------+--------+--------------------------+--------+        PSV cm/sEDV cm/sStenosisPlaque Description      Comments  +----------+--------+--------+--------+--------------------------+--------+   CCA Prox 112   22                            +----------+--------+--------+--------+--------------------------+--------+   CCA Mid  95   24                            +----------+--------+--------+--------+--------------------------+--------+   CCA Distal103   26   <50%  calcific                 +----------+--------+--------+--------+--------------------------+--------+   ICA Prox 105   29   1-39%  irregular and heterogenous        +----------+--------+--------+--------+--------------------------+--------+   ICA Mid  90   30                            +----------+--------+--------+--------+--------------------------+--------+   ICA Distal78   27                            +----------+--------+--------+--------+--------------------------+--------+   ECA    298   35   >50%  calcific                 +----------+--------+--------+--------+--------------------------+--------+    +----------+--------+--------+----------------+-------------------+       PSV cm/sEDV cm/sDescribe    Arm Pressure (mmHG)  +----------+--------+--------+----------------+-------------------+  [id number]   0    Multiphasic, WNL            +----------+--------+--------+----------------+-------------------+   +---------+--------+--+--------+--+---------+  VertebralPSV cm/s56EDV cm/s13Antegrade  +---------+--------+--+--------+--+---------+          Summary:  Right Carotid: Velocities in the right ICA are consistent with a 1-39%  stenosis.         Non-hemodynamically significant plaque <50% noted in the  CCA. The         ECA appears >50% stenosed.   Left Carotid: Velocities in the left ICA are consistent with a  1-39%  stenosis.        Non-hemodynamically significant plaque <50% noted in the  CCA. The        ECA appears >50% stenosed.   Vertebrals: Bilateral vertebral arteries demonstrate antegrade flow.  Subclavians: Normal flow hemodynamics were seen in bilateral subclavian        arteries.    ASSESSMENT: Symptomatic ICA stenosis B ICA stenosis 1-39% unchanged from previous duplex.  He remains asymptomatic.   PLAN:  He will f/u in 1 year for repeat carotid duplex.  We reviewed the symptoms of stroke/TIA if these occur he will call 911.   Mosetta Pigeon PA-C [facility] Office: [phone]  MD in clinic [place]

## 2021-02-08 DIAGNOSIS — S01511A Laceration without foreign body of lip, initial encounter: Secondary | ICD-10-CM | POA: Diagnosis not present

## 2021-02-08 DIAGNOSIS — W19XXXA Unspecified fall, initial encounter: Secondary | ICD-10-CM | POA: Diagnosis not present

## 2021-02-15 DIAGNOSIS — H401213 Low-tension glaucoma, right eye, severe stage: Secondary | ICD-10-CM | POA: Diagnosis not present

## 2021-02-15 DIAGNOSIS — H401221 Low-tension glaucoma, left eye, mild stage: Secondary | ICD-10-CM | POA: Diagnosis not present

## 2021-02-15 DIAGNOSIS — H2513 Age-related nuclear cataract, bilateral: Secondary | ICD-10-CM | POA: Diagnosis not present

## 2021-02-15 DIAGNOSIS — H4710 Unspecified papilledema: Secondary | ICD-10-CM | POA: Diagnosis not present

## 2021-02-21 NOTE — Progress Notes (Signed)
[facility] Note  02/22/2021     CHIEF COMPLAINT Patient presents for Retina Evaluation   HISTORY OF PRESENT ILLNESS: Anthony Noble. is a 80 y.o. male who presents to the clinic today for:   HPI    Retina Evaluation    In both eyes.  This started weeks ago.  Duration of weeks.  Context:  distance vision and near vision.  I, the attending physician,  performed the HPI with the patient and updated documentation appropriately.          Comments    Pt states he has been seeing Dr. Ander Slade at [facility] for Glaucoma management and was originally referred to colleague at [facility], but distance was too far for patient to travel.   Patient states distance vision is very good OU--has a little trouble reading very small print in books or at computer.  Patient denies eye pain or discomfort and denies any new or worsening floaters or fol OU.  Currently using: Brimonidine BID OD Dorz/Tim BID OU Latanoprost QHS OU       Last edited by Bernarda Caffey, MD on 02/22/2021  9:22 PM. (History)    pt is here on the referral of Dr. Ander Slade for optic disc swelling, pt states his distance vision is good, but mid and near vision is blurry, he sees Dr. Marin Comment for his glasses rx, and has not updated his rx in 1-2 years, pt states a week ago Thursday he fell and hit his face on the ground, he states 2 weeks before that, he fell off of a stool also and hit the back of his head  Referring physician: Gwenyth Ober, MD [address] [place],  [place] [address]  HISTORICAL INFORMATION:   Selected notes from the [place] Referred by [facility] for eval of edema   CURRENT MEDICATIONS: Current Outpatient Medications (Ophthalmic Drugs)  Medication Sig  . brimonidine (ALPHAGAN) 0.2 % ophthalmic solution Place into the right eye 2 (two) times daily.  . dorzolamide-timolol (COSOPT) 22.3-6.8 MG/ML ophthalmic solution Place 1 drop into both eyes 2 (two)  times daily.  Marland Kitchen latanoprost (XALATAN) 0.005 % ophthalmic solution Place 1 drop into both eyes at bedtime.   No current facility-administered medications for this visit. (Ophthalmic Drugs)   Current Outpatient Medications (Other)  Medication Sig  . atorvastatin (LIPITOR) 20 MG tablet TAKE 1 TABLET BY MOUTH  DAILY  . BIOTIN 5000 PO Take 5,000 mg by mouth daily.  . cholecalciferol (VITAMIN D) 1000 units tablet Take 5,000 Units by mouth daily.   . Coenzyme Q10 (COQ10) 200 MG CAPS Take 1 capsule by mouth daily.  Marland Kitchen CRANBERRY PO Take 4,200 mg by mouth daily.  . diphenhydrAMINE HCl (BENADRYL ALLERGY PO) Take by mouth as needed.  . Fe Fum-FePoly-FA-Vit C-Vit B3 (FOLIVANE-F) 125-1 MG CAPS Take 1 capsule by mouth daily.  . fexofenadine (ALLEGRA) 180 MG tablet Take 180 mg by mouth daily.  . fluticasone (FLONASE) 50 MCG/ACT nasal spray USE 1 SPRAY(S) IN EACH NOSTRIL TWICE DAILY AS NEEDED FOR ALLERGIES OR  RHINITIS (Patient not taking: Reported on 12/14/2020)  . hyoscyamine (LEVSIN SL) 0.125 MG SL tablet DISSOLVE 1 TABLET IN MOUTH EVERY 8 HOURS AS NEEDED  . levothyroxine (SYNTHROID) 75 MCG tablet TAKE 1 TABLET BY MOUTH  DAILY BEFORE BREAKFAST .  EQUIVALENT TO EUTHYROX  . Magnesium 500 MG CAPS Take 1 capsule by mouth daily.  . Multiple Vitamin (MULTIVITAMIN WITH MINERALS) TABS tablet  Take 1 tablet by mouth daily.  . Omega-3 Fatty Acids (FISH OIL) 1200 MG CAPS Take by mouth.   No current facility-administered medications for this visit. (Other)      REVIEW OF SYSTEMS: ROS    Positive for: Eyes   Negative for: Constitutional, Gastrointestinal, Neurological, Skin, Genitourinary, Musculoskeletal, HENT, Endocrine, Cardiovascular, Respiratory, Psychiatric, Allergic/Imm, Heme/Lymph   Last edited by Doneen Poisson on 02/22/2021  8:55 AM. (History)       ALLERGIES Allergies  Allergen Reactions  . Bcg Live Other (See Comments)    Treatment pt had- shakes  . Septra [Bactrim] Other (See Comments)     Muscle weakness, numbness  . Sulfa Antibiotics Other (See Comments)    Severe weakness  . Pertussis Vaccines     History of Blenda Bridegroom     PAST MEDICAL HISTORY Past Medical History:  Diagnosis Date  . Bilateral carotid artery stenosis without cerebral infarction    Asymptomatic---bilateral ICA <40%  and mild stenosis bilateral ECA  . BPH (benign prostatic hypertrophy) with urinary obstruction   . Cancer [facility]) 2004   bladder  . Diverticulosis of colon   . Glaucoma   . Guillain Barr syndrome ([place]) 2001  . Heart murmur March /  April 2016  . History of bladder cancer    first dx 2004--  tx with chemo bladder instillation  . History of colon polyps   . History of pleural effusion    2001  . History of vertebral fracture    2001--   T12  . Hyperlipidemia   . Hypothyroidism   . Pneumonia 2001  . Varicose veins    Past Surgical History:  Procedure Laterality Date  . CYSTO/  BILATERAL RETROGRADE PYLOGRAM/  RESECTION BLADDER TUMOR  07-30-2011  . CYSTOSCOPY WITH BIOPSY N/A 01/12/2015   Procedure: CYSTOSCOPY WITH BLADDER  BIOPSY, FULGERATION;  Surgeon: Malka So, MD;  Location: [facility];  Service: Urology;  Laterality: N/A;  . NEGATIVE SLEEP STUDY  YRS AGO per pt  . ORIF RIGHT DISTAL RADIUS FX  12-31-2007  . TONSILLECTOMY  as child  . TRANSURETHRAL RESECTION OF BLADDER TUMOR WITH MITOMYCIN-C  06-07-2003  . VIDEO ASSISTED THORACOSCOPY (VATS)/EMPYEMA Right 05-01-2000   and Drainage of effusion    FAMILY HISTORY Family History  Problem Relation Age of Onset  . Other Mother        varicose veins  . Cancer Mother        Ovarian  . Varicose Veins Mother   . Nephrolithiasis Mother   . COPD Father   . Hyperlipidemia Father   . Cancer Maternal Grandmother   . Bipolar disorder Daughter   . Healthy Daughter   . Hypertension Sister   . Healthy Daughter   . Colon cancer Neg Hx     SOCIAL HISTORY Social History   Tobacco Use  . Smoking status: Former  Smoker    Packs/day: 1.00    Years: 20.00    Pack years: 20.00    Types: Cigarettes    Quit date: 12/09/1981    Years since quitting: 39.2  . Smokeless tobacco: Never Used  Vaping Use  . Vaping Use: Never used  Substance Use Topics  . Alcohol use: Yes    Alcohol/week: 2.0 standard drinks    Types: 1 Shots of liquor, 1 Standard drinks or equivalent per week  . Drug use: No         OPHTHALMIC EXAM:  Base Eye Exam  Visual Acuity (Snellen - Linear)      Right Left   Dist cc 20/20 -2 20/20 -2   Correction: Glasses       Tonometry (Tonopen, 9:06 AM)      Right Left   Pressure 13 14       Pupils      Dark Light Shape React APD   Right 2 1 Round Minimal 0   Left 4 3 Round Brisk 0       Visual Fields      Left Right    Full Full       Extraocular Movement      Right Left    Full Full       Neuro/Psych    Oriented x3: Yes   Mood/Affect: Normal       Dilation    Both eyes: 1.0% Mydriacyl, 2.5% Phenylephrine @ 9:06 AM        Slit Lamp and Fundus Exam    Slit Lamp Exam      Right Left   Lids/Lashes Dermatochalasis - upper lid Dermatochalasis - upper lid, mild MGD   Conjunctiva/Sclera White and quiet Mild nasal and temporal pinguecula   Cornea mild arcus, trace PEE, trace tear film debris mild arcus, trace PEE, trace tear film debris   Anterior Chamber Deep and quiet Deep and quiet   Iris Round and dilated Round and dilated   Lens 2+ Nuclear sclerosis, 2+ Cortical cataract 2-3+ Nuclear sclerosis, 2-3+ Cortical cataract   Vitreous Vitreous syneresis, Posterior vitreous detachment Vitreous syneresis, Posterior vitreous detachment       Fundus Exam      Right Left   Disc Pink and Sharp, +cupping, mild PPA Pink and Sharp, mild elevation superiorly, peripapillary cystic changes v schisis superior disc   C/D Ratio 0.75 0.5   Macula Flat, Blunted foveal reflex, RPE mottling, mild No heme or edema Flat, Blunted foveal reflex, RPE mottling, mild No heme or edema    Vessels mild attenuation, mild tortuousity mild attenuation, mild tortuousity   Periphery Attached, mild reticular degeneration, No heme  Attached, mild reticular degeneration, No heme           IMAGING AND PROCEDURES  Imaging and Procedures for 02/22/2021  OCT, Retina - OU - Both Eyes       Right Eye Quality was good. Central Foveal Thickness: 293. Progression has no prior data. Findings include normal foveal contour, no IRF, no SRF.   Left Eye Quality was good. Central Foveal Thickness: 289. Progression has no prior data. Findings include normal foveal contour, no SRF, intraretinal fluid (Peripapillary edema superior to disc -- ?multilaminar schisis).   Notes *Images captured and stored on drive  Diagnosis / Impression:  OD: NFP, no IRF/SRF  OS: NFP, no SRF, +IRF -- Peripapillary edema superior to disc -- ?multilaminar schisis  Clinical management:  See below  Abbreviations: NFP - Normal foveal profile. CME - cystoid macular edema. PED - pigment epithelial detachment. IRF - intraretinal fluid. SRF - subretinal fluid. EZ - ellipsoid zone. ERM - epiretinal membrane. ORA - outer retinal atrophy. ORT - outer retinal tubulation. SRHM - subretinal hyper-reflective material. IRHM - intraretinal hyper-reflective material        Fluorescein Angiography Optos (Transit OS)       Right Eye   Progression has no prior data. Early phase findings include normal observations. Mid/Late phase findings include normal observations.   Left Eye   Progression has no prior data. Early phase findings  include normal observations. Mid/Late phase findings include normal observations (No leakage or CNV superior to disc).   Notes **Images stored on drive**  Impression: Normal study OU No leakage or CNV OS superior disc                  ASSESSMENT/PLAN:    ICD-10-CM   1. Retinoschisis and retinal cysts of left eye  H33.192   2. Retinal edema  H35.81 OCT, Retina - OU - Both  Eyes  3. Essential hypertension  I10   4. Hypertensive retinopathy of both eyes  H35.033 Fluorescein Angiography Optos (Transit OS)  5. Combined forms of age-related cataract of both eyes  H25.813   6. Low-tension glaucoma of both eyes, unspecified glaucoma stage  H40.1230      1,2. Retinoschisis OS  - focal peripapillary patch of multilaminar retinoschisis superior to disc  - OCT shows peripapillary schisis  - FA 3.17.22 -- no leakage or CNV  - discussed findings, prognosis  - no retinal / ocular intervention indicated or recommended  - monitor  - f/u in 3 wks -- DFE/OCT  3,4. Hypertensive retinopathy OU - discussed importance of tight BP control - monitor  5. Mixed Cataract OU - The symptoms of cataract, surgical options, and treatments and risks were discussed with patient. - discussed diagnosis and progression - not yet visually significant - monitor for now  6. Low tension glaucoma OU  - under the expert management of Dr. Ander Slade  - IOP today: 13,14  - on Cosopt BID OU, latanoprost QHS OU, brimonidine BID OD    Ophthalmic Meds Ordered this visit:  No orders of the defined types were placed in this encounter.      Return in about 3 weeks (around 03/15/2021).  There are no Patient Instructions on file for this visit.   Explained the diagnoses, plan, and follow up with the patient and they expressed understanding.  Patient expressed understanding of the importance of proper follow up care.   This document serves as a record of services personally performed by Gardiner Sleeper, MD, PhD. It was created on their behalf by Estill Bakes, COT an ophthalmic technician. The creation of this record is the provider's dictation and/or activities during the visit.    Electronically signed by: Estill Bakes, COT 3.16.22 @ 9:46 PM   This document serves as a record of services personally performed by Gardiner Sleeper, MD, PhD. It was created on their behalf by San Jetty. Owens Shark, OA an  ophthalmic technician. The creation of this record is the provider's dictation and/or activities during the visit.    Electronically signed by: San Jetty. Marguerita Merles 03.17.2022 9:46 PM  Gardiner Sleeper, M.D., Ph.D. Diseases & Surgery of the Retina and Vitreous [facility]  I have reviewed the above documentation for accuracy and completeness, and I agree with the above. Gardiner Sleeper, M.D., Ph.D. 02/22/21 9:46 PM   Abbreviations: M myopia (nearsighted); A astigmatism; H hyperopia (farsighted); P presbyopia; Mrx spectacle prescription;  CTL contact lenses; OD right eye; OS left eye; OU both eyes  XT exotropia; ET esotropia; PEK punctate epithelial keratitis; PEE punctate epithelial erosions; DES dry eye syndrome; MGD meibomian gland dysfunction; ATs artificial tears; PFAT's preservative free artificial tears; [place] nuclear sclerotic cataract; PSC posterior subcapsular cataract; ERM epi-retinal membrane; PVD posterior vitreous detachment; RD retinal detachment; DM diabetes mellitus; DR diabetic retinopathy; NPDR non-proliferative diabetic retinopathy; PDR proliferative diabetic retinopathy; CSME clinically significant macular edema; DME  diabetic macular edema; dbh dot blot hemorrhages; CWS cotton wool spot; POAG primary open angle glaucoma; C/D cup-to-disc ratio; HVF humphrey visual field; GVF goldmann visual field; OCT optical coherence tomography; IOP intraocular pressure; BRVO Branch retinal vein occlusion; CRVO central retinal vein occlusion; CRAO central retinal artery occlusion; BRAO branch retinal artery occlusion; RT retinal tear; SB scleral buckle; PPV pars plana vitrectomy; VH Vitreous hemorrhage; PRP panretinal laser photocoagulation; IVK intravitreal kenalog; VMT vitreomacular traction; MH Macular hole;  NVD neovascularization of the disc; NVE neovascularization elsewhere; AREDS age related eye disease study; ARMD age related macular degeneration; POAG primary open angle  glaucoma; EBMD epithelial/anterior basement membrane dystrophy; ACIOL anterior chamber intraocular lens; IOL intraocular lens; PCIOL posterior chamber intraocular lens; Phaco/IOL phacoemulsification with intraocular lens placement; [place] photorefractive keratectomy; LASIK laser assisted in situ keratomileusis; HTN hypertension; DM diabetes mellitus; COPD chronic obstructive pulmonary disease

## 2021-02-22 ENCOUNTER — Ambulatory Visit (INDEPENDENT_AMBULATORY_CARE_PROVIDER_SITE_OTHER): Payer: Medicare Other | Admitting: Ophthalmology

## 2021-02-22 ENCOUNTER — Other Ambulatory Visit: Payer: Self-pay

## 2021-02-22 ENCOUNTER — Encounter (INDEPENDENT_AMBULATORY_CARE_PROVIDER_SITE_OTHER): Payer: Self-pay | Admitting: Ophthalmology

## 2021-02-22 DIAGNOSIS — I10 Essential (primary) hypertension: Secondary | ICD-10-CM | POA: Diagnosis not present

## 2021-02-22 DIAGNOSIS — H401230 Low-tension glaucoma, bilateral, stage unspecified: Secondary | ICD-10-CM | POA: Diagnosis not present

## 2021-02-22 DIAGNOSIS — H33192 Other retinoschisis and retinal cysts, left eye: Secondary | ICD-10-CM

## 2021-02-22 DIAGNOSIS — H3581 Retinal edema: Secondary | ICD-10-CM | POA: Diagnosis not present

## 2021-02-22 DIAGNOSIS — H35033 Hypertensive retinopathy, bilateral: Secondary | ICD-10-CM | POA: Diagnosis not present

## 2021-02-22 DIAGNOSIS — H25813 Combined forms of age-related cataract, bilateral: Secondary | ICD-10-CM

## 2021-03-07 DIAGNOSIS — Z8551 Personal history of malignant neoplasm of bladder: Secondary | ICD-10-CM | POA: Diagnosis not present

## 2021-03-13 NOTE — Progress Notes (Signed)
[facility] Note  03/15/2021     CHIEF COMPLAINT Patient presents for Retina Follow Up   HISTORY OF PRESENT ILLNESS: Anthony Noble. is a 80 y.o. male who presents to the clinic today for:  HPI    Retina Follow Up    Patient presents with  Other.  This started 3 weeks ago.  I, the attending physician,  performed the HPI with the patient and updated documentation appropriately.          Comments    Pt returning for 3 week retinal follow up, HRVO OD.        Last edited by Bernarda Caffey, MD on 03/15/2021  1:58 PM. (History)    Pt states vision is stable, he is using brim BID OD only  Referring physician: Dettinger, Fransisca Kaufmann, MD [place],  [place] [id number]  HISTORICAL INFORMATION:   Selected notes from the MEDICAL RECORD NUMBER Referred by [facility] for eval of edema   CURRENT MEDICATIONS: Current Outpatient Medications (Ophthalmic Drugs)  Medication Sig  . brimonidine (ALPHAGAN) 0.2 % ophthalmic solution Place into the right eye 2 (two) times daily.  . dorzolamide-timolol (COSOPT) 22.3-6.8 MG/ML ophthalmic solution Place 1 drop into both eyes 2 (two) times daily.  Marland Kitchen latanoprost (XALATAN) 0.005 % ophthalmic solution Place 1 drop into both eyes at bedtime.   No current facility-administered medications for this visit. (Ophthalmic Drugs)   Current Outpatient Medications (Other)  Medication Sig  . atorvastatin (LIPITOR) 20 MG tablet TAKE 1 TABLET BY MOUTH  DAILY  . BIOTIN 5000 PO Take 5,000 mg by mouth daily.  . cholecalciferol (VITAMIN D) 1000 units tablet Take 5,000 Units by mouth daily.   . Coenzyme Q10 (COQ10) 200 MG CAPS Take 1 capsule by mouth daily.  Marland Kitchen CRANBERRY PO Take 4,200 mg by mouth daily.  . diphenhydrAMINE HCl (BENADRYL ALLERGY PO) Take by mouth as needed.  . Fe Fum-FePoly-FA-Vit C-Vit B3 (FOLIVANE-F) 125-1 MG CAPS Take 1 capsule by mouth daily.  . fexofenadine (ALLEGRA) 180 MG tablet Take 180 mg by mouth daily.  .  fluticasone (FLONASE) 50 MCG/ACT nasal spray USE 1 SPRAY(S) IN EACH NOSTRIL TWICE DAILY AS NEEDED FOR ALLERGIES OR  RHINITIS (Patient not taking: Reported on 12/14/2020)  . hyoscyamine (LEVSIN SL) 0.125 MG SL tablet DISSOLVE 1 TABLET IN MOUTH EVERY 8 HOURS AS NEEDED  . levothyroxine (SYNTHROID) 75 MCG tablet TAKE 1 TABLET BY MOUTH  DAILY BEFORE BREAKFAST .  EQUIVALENT TO EUTHYROX  . Magnesium 500 MG CAPS Take 1 capsule by mouth daily.  . Multiple Vitamin (MULTIVITAMIN WITH MINERALS) TABS tablet Take 1 tablet by mouth daily.  . Omega-3 Fatty Acids (FISH OIL) 1200 MG CAPS Take by mouth.   No current facility-administered medications for this visit. (Other)      REVIEW OF SYSTEMS: ROS    Positive for: Eyes   Negative for: Constitutional, Gastrointestinal, Neurological, Skin, Genitourinary, Musculoskeletal, HENT, Endocrine, Cardiovascular, Respiratory, Psychiatric, Allergic/Imm, Heme/Lymph   Last edited by Kingsley Spittle, COT on 03/15/2021  9:00 AM. (History)       ALLERGIES Allergies  Allergen Reactions  . Bcg Live Other (See Comments)    Treatment pt had- shakes  . Septra [Bactrim] Other (See Comments)    Muscle weakness, numbness  . Sulfa Antibiotics Other (See Comments)    Severe weakness  . Pertussis Vaccines     History of Blenda Bridegroom     PAST MEDICAL HISTORY Past  Medical History:  Diagnosis Date  . Bilateral carotid artery stenosis without cerebral infarction    Asymptomatic---bilateral ICA <40%  and mild stenosis bilateral ECA  . BPH (benign prostatic hypertrophy) with urinary obstruction   . Cancer [facility]) 2004   bladder  . Diverticulosis of colon   . Glaucoma   . Guillain Barr syndrome ([place]) 2001  . Heart murmur March /  April 2016  . History of bladder cancer    first dx 2004--  tx with chemo bladder instillation  . History of colon polyps   . History of pleural effusion    2001  . History of vertebral fracture    2001--   T12  . Hyperlipidemia   .  Hypothyroidism   . Pneumonia 2001  . Varicose veins    Past Surgical History:  Procedure Laterality Date  . CYSTO/  BILATERAL RETROGRADE PYLOGRAM/  RESECTION BLADDER TUMOR  07-30-2011  . CYSTOSCOPY WITH BIOPSY N/A 01/12/2015   Procedure: CYSTOSCOPY WITH BLADDER  BIOPSY, FULGERATION;  Surgeon: Malka So, MD;  Location: [facility];  Service: Urology;  Laterality: N/A;  . NEGATIVE SLEEP STUDY  YRS AGO per pt  . ORIF RIGHT DISTAL RADIUS FX  12-31-2007  . TONSILLECTOMY  as child  . TRANSURETHRAL RESECTION OF BLADDER TUMOR WITH MITOMYCIN-C  06-07-2003  . VIDEO ASSISTED THORACOSCOPY (VATS)/EMPYEMA Right 05-01-2000   and Drainage of effusion    FAMILY HISTORY Family History  Problem Relation Age of Onset  . Other Mother        varicose veins  . Cancer Mother        Ovarian  . Varicose Veins Mother   . Nephrolithiasis Mother   . COPD Father   . Hyperlipidemia Father   . Cancer Maternal Grandmother   . Bipolar disorder Daughter   . Healthy Daughter   . Hypertension Sister   . Healthy Daughter   . Colon cancer Neg Hx     SOCIAL HISTORY Social History   Tobacco Use  . Smoking status: Former Smoker    Packs/day: 1.00    Years: 20.00    Pack years: 20.00    Types: Cigarettes    Quit date: 12/09/1981    Years since quitting: 39.2  . Smokeless tobacco: Never Used  Vaping Use  . Vaping Use: Never used  Substance Use Topics  . Alcohol use: Yes    Alcohol/week: 2.0 standard drinks    Types: 1 Shots of liquor, 1 Standard drinks or equivalent per week  . Drug use: No         OPHTHALMIC EXAM:  Base Eye Exam    Visual Acuity (Snellen - Linear)      Right Left   Dist cc 20/20 -2 20/25 -2   Correction: Glasses       Tonometry (Tonopen, 9:08 AM)      Right Left   Pressure 15 14       Pupils      Dark Light Shape React APD   Right 3 2 Round Minimal None   Left 4 3 Round Minimal None       Visual Fields (Counting fingers)      Left Right    Full  Full       Extraocular Movement      Right Left    Full, Ortho Full, Ortho       Neuro/Psych    Oriented x3: Yes   Mood/Affect: Normal  Dilation    Both eyes: 1.0% Mydriacyl, 2.5% Phenylephrine @ 9:09 AM        Slit Lamp and Fundus Exam    Slit Lamp Exam      Right Left   Lids/Lashes Dermatochalasis - upper lid Dermatochalasis - upper lid, mild MGD   Conjunctiva/Sclera White and quiet Mild nasal and temporal pinguecula   Cornea mild arcus, trace PEE, trace tear film debris mild arcus, trace PEE, trace tear film debris   Anterior Chamber Deep and quiet Deep and quiet   Iris Round and dilated Round and dilated   Lens 2+ Nuclear sclerosis, 2+ Cortical cataract 2-3+ Nuclear sclerosis, 2-3+ Cortical cataract   Vitreous Vitreous syneresis, Posterior vitreous detachment Vitreous syneresis, Posterior vitreous detachment       Fundus Exam      Right Left   Disc Pink and Sharp, +cupping, mild PPA, Thin superiorrim Pink and Sharp, mild elevation superiorly, mild peripapillary schisis superior to disc -- stable   C/D Ratio 0.75 0.5   Macula Flat, Blunted foveal reflex, RPE mottling, No heme or edema Flat, Blunted foveal reflex, RPE mottling, mild No heme or edema   Vessels mild attenuation, mild tortuousity mild attenuation, mild tortuousity   Periphery Attached, mild reticular degeneration, No heme  Attached, mild reticular degeneration, No heme           IMAGING AND PROCEDURES  Imaging and Procedures for 03/15/2021  OCT, Retina - OU - Both Eyes       Right Eye Quality was good. Central Foveal Thickness: 293. Progression has been stable. Findings include normal foveal contour, no IRF, no SRF.   Left Eye Quality was good. Central Foveal Thickness: 292. Progression has been stable. Findings include normal foveal contour, no SRF, intraretinal fluid (Peripapillary edema superior to disc -- multilaminar schisis -- stable from prior).   Notes *Images captured and stored on  drive  Diagnosis / Impression:  OD: NFP, no IRF/SRF  OS: NFP, no SRF, +IRF -- Peripapillary edema superior to disc -- multilaminar schisis -- stable from prior  Clinical management:  See below  Abbreviations: NFP - Normal foveal profile. CME - cystoid macular edema. PED - pigment epithelial detachment. IRF - intraretinal fluid. SRF - subretinal fluid. EZ - ellipsoid zone. ERM - epiretinal membrane. ORA - outer retinal atrophy. ORT - outer retinal tubulation. SRHM - subretinal hyper-reflective material. IRHM - intraretinal hyper-reflective material               ASSESSMENT/PLAN:    ICD-10-CM   1. Retinoschisis and retinal cysts of left eye  H33.192   2. Retinal edema  H35.81 OCT, Retina - OU - Both Eyes  3. Essential hypertension  I10   4. Hypertensive retinopathy of both eyes  H35.033   5. Combined forms of age-related cataract of both eyes  H25.813   6. Low-tension glaucoma of both eyes, unspecified glaucoma stage  H40.1230      1,2. Retinoschisis OS -- stable  - focal peripapillary patch of multilaminar retinoschisis superior to disc  - OCT shows peripapillary schisis -- stable from prior  - FA 3.17.22 -- no leakage or CNV  - discussed findings, prognosis  - no retinal / ocular intervention indicated or recommended  - monitor  - f/u in 3-4 months -- DFE/OCT  3,4. Hypertensive retinopathy OU - discussed importance of tight BP control - monitor  5. Mixed Cataract OU - The symptoms of cataract, surgical options, and treatments and risks were discussed with patient. -  discussed diagnosis and progression - not yet visually significant - monitor for now  6. Low tension glaucoma OU  - under the expert management of Dr. Ander Slade  - IOP today: 15,14  - on Cosopt BID OU, latanoprost QHS OU, brimonidine BID OD  Ophthalmic Meds Ordered this visit:  No orders of the defined types were placed in this encounter.     Return for f/u 3-4 months, retinoschisis OS, DFE,  OCT.  There are no Patient Instructions on file for this visit.   Explained the diagnoses, plan, and follow up with the patient and they expressed understanding.  Patient expressed understanding of the importance of proper follow up care.   This document serves as a record of services personally performed by Gardiner Sleeper, MD, PhD. It was created on their behalf by Estill Bakes, COT an ophthalmic technician. The creation of this record is the provider's dictation and/or activities during the visit.    Electronically signed by: Estill Bakes, COT 4.5.22 @ 2:00 PM   This document serves as a record of services personally performed by Gardiner Sleeper, MD, PhD. It was created on their behalf by San Jetty. Owens Shark, OA an ophthalmic technician. The creation of this record is the provider's dictation and/or activities during the visit.    Electronically signed by: San Jetty. Owens Shark, [place] 04.07.2022 2:00 PM  Gardiner Sleeper, M.D., Ph.D. Diseases & Surgery of the Retina and [place] 03/15/2021   I have reviewed the above documentation for accuracy and completeness, and I agree with the above. Gardiner Sleeper, M.D., Ph.D. 03/15/21 2:02 PM  Abbreviations: M myopia (nearsighted); A astigmatism; H hyperopia (farsighted); P presbyopia; Mrx spectacle prescription;  CTL contact lenses; OD right eye; OS left eye; OU both eyes  XT exotropia; ET esotropia; PEK punctate epithelial keratitis; PEE punctate epithelial erosions; DES dry eye syndrome; MGD meibomian gland dysfunction; ATs artificial tears; PFAT's preservative free artificial tears; [place] nuclear sclerotic cataract; PSC posterior subcapsular cataract; ERM epi-retinal membrane; PVD posterior vitreous detachment; RD retinal detachment; DM diabetes mellitus; DR diabetic retinopathy; NPDR non-proliferative diabetic retinopathy; PDR proliferative diabetic retinopathy; CSME clinically significant macular edema; DME diabetic  macular edema; dbh dot blot hemorrhages; CWS cotton wool spot; POAG primary open angle glaucoma; C/D cup-to-disc ratio; HVF humphrey visual field; GVF goldmann visual field; OCT optical coherence tomography; IOP intraocular pressure; BRVO Branch retinal vein occlusion; CRVO central retinal vein occlusion; CRAO central retinal artery occlusion; BRAO branch retinal artery occlusion; RT retinal tear; SB scleral buckle; PPV pars plana vitrectomy; VH Vitreous hemorrhage; PRP panretinal laser photocoagulation; IVK intravitreal kenalog; VMT vitreomacular traction; MH Macular hole;  NVD neovascularization of the disc; NVE neovascularization elsewhere; AREDS age related eye disease study; ARMD age related macular degeneration; POAG primary open angle glaucoma; EBMD epithelial/anterior basement membrane dystrophy; ACIOL anterior chamber intraocular lens; IOL intraocular lens; PCIOL posterior chamber intraocular lens; Phaco/IOL phacoemulsification with intraocular lens placement; [place] photorefractive keratectomy; LASIK laser assisted in situ keratomileusis; HTN hypertension; DM diabetes mellitus; COPD chronic obstructive pulmonary disease

## 2021-03-15 ENCOUNTER — Other Ambulatory Visit: Payer: Self-pay

## 2021-03-15 ENCOUNTER — Ambulatory Visit (INDEPENDENT_AMBULATORY_CARE_PROVIDER_SITE_OTHER): Payer: Medicare Other | Admitting: Ophthalmology

## 2021-03-15 ENCOUNTER — Encounter (INDEPENDENT_AMBULATORY_CARE_PROVIDER_SITE_OTHER): Payer: Self-pay | Admitting: Ophthalmology

## 2021-03-15 DIAGNOSIS — I10 Essential (primary) hypertension: Secondary | ICD-10-CM

## 2021-03-15 DIAGNOSIS — H35033 Hypertensive retinopathy, bilateral: Secondary | ICD-10-CM | POA: Diagnosis not present

## 2021-03-15 DIAGNOSIS — H3581 Retinal edema: Secondary | ICD-10-CM

## 2021-03-15 DIAGNOSIS — H401230 Low-tension glaucoma, bilateral, stage unspecified: Secondary | ICD-10-CM | POA: Diagnosis not present

## 2021-03-15 DIAGNOSIS — H33192 Other retinoschisis and retinal cysts, left eye: Secondary | ICD-10-CM | POA: Diagnosis not present

## 2021-03-15 DIAGNOSIS — H25813 Combined forms of age-related cataract, bilateral: Secondary | ICD-10-CM

## 2021-04-04 DIAGNOSIS — H401221 Low-tension glaucoma, left eye, mild stage: Secondary | ICD-10-CM | POA: Diagnosis not present

## 2021-04-04 DIAGNOSIS — H2513 Age-related nuclear cataract, bilateral: Secondary | ICD-10-CM | POA: Diagnosis not present

## 2021-04-04 DIAGNOSIS — H401213 Low-tension glaucoma, right eye, severe stage: Secondary | ICD-10-CM | POA: Diagnosis not present

## 2021-04-12 DIAGNOSIS — M79673 Pain in unspecified foot: Secondary | ICD-10-CM | POA: Diagnosis not present

## 2021-04-12 DIAGNOSIS — D2371 Other benign neoplasm of skin of right lower limb, including hip: Secondary | ICD-10-CM | POA: Diagnosis not present

## 2021-04-17 ENCOUNTER — Other Ambulatory Visit: Payer: Self-pay | Admitting: Family Medicine

## 2021-05-17 ENCOUNTER — Other Ambulatory Visit: Payer: Self-pay | Admitting: Family Medicine

## 2021-05-17 DIAGNOSIS — H2513 Age-related nuclear cataract, bilateral: Secondary | ICD-10-CM | POA: Diagnosis not present

## 2021-05-17 DIAGNOSIS — H4710 Unspecified papilledema: Secondary | ICD-10-CM | POA: Diagnosis not present

## 2021-05-17 DIAGNOSIS — H401221 Low-tension glaucoma, left eye, mild stage: Secondary | ICD-10-CM | POA: Diagnosis not present

## 2021-05-17 DIAGNOSIS — H401213 Low-tension glaucoma, right eye, severe stage: Secondary | ICD-10-CM | POA: Diagnosis not present

## 2021-05-24 ENCOUNTER — Telehealth: Payer: Self-pay | Admitting: Family Medicine

## 2021-05-24 NOTE — Telephone Encounter (Signed)
Okay to schedule with me where slots are available 30 min medication management appt

## 2021-05-24 NOTE — Telephone Encounter (Signed)
Pt's wife said she had an appt with Almyra Free and that she was told her husband could call and make an appt. Please advise.

## 2021-05-29 NOTE — Telephone Encounter (Signed)
Apt scheduled.  

## 2021-06-05 ENCOUNTER — Other Ambulatory Visit: Payer: Self-pay

## 2021-06-05 ENCOUNTER — Ambulatory Visit (INDEPENDENT_AMBULATORY_CARE_PROVIDER_SITE_OTHER): Payer: Medicare Other | Admitting: Pharmacist

## 2021-06-05 DIAGNOSIS — E7800 Pure hypercholesterolemia, unspecified: Secondary | ICD-10-CM

## 2021-06-05 DIAGNOSIS — E039 Hypothyroidism, unspecified: Secondary | ICD-10-CM

## 2021-06-05 NOTE — Progress Notes (Signed)
    06/05/2021 Name: [facility] [name] MRN: [id number] DOB: 1941/06/19   S:  13 yoM Presents for medication management & education.  His wife is present for visit and assists patient with his daily medications.  They maintain an active lifestyle and workout daily.  Patient also plays golf  Insurance coverage/medication affordability: [employer] medicare  Patient reports adherence with medications.  Patient-reported exercise habits: gym/golf   O:  Lipid Panel     Component Value Date/Time   CHOL 183 06/13/2020 0823   TRIG 73 06/13/2020 0823   TRIG 57 04/15/2016 1038   HDL 59 06/13/2020 0823   HDL 72 04/15/2016 1038   CHOLHDL 3.1 06/13/2020 0823   CHOLHDL 3.7 03/24/2013 0832   VLDL 16 03/24/2013 0832   LDLCALC 110 (H) 06/13/2020 0823   A/P:  Reviewed medication list thoroughly No significant changes made to regimen Patient's LDL-cholesterol is elevated -->will repeat labs prior to next PCP appt and can alter statin at that time Repeat thyroid labs as well--since dose change, patient does not have as much energy Medication list updated to reflect OTC supplements and current medications   Written patient instructions provided.  Total time in face to face counseling 25 minutes.    Regina Eck, PharmD, BCPS Clinical Pharmacist, [place]  II Phone [phone]

## 2021-06-07 ENCOUNTER — Other Ambulatory Visit: Payer: Self-pay | Admitting: Family Medicine

## 2021-06-07 DIAGNOSIS — D1801 Hemangioma of skin and subcutaneous tissue: Secondary | ICD-10-CM | POA: Diagnosis not present

## 2021-06-07 DIAGNOSIS — L570 Actinic keratosis: Secondary | ICD-10-CM | POA: Diagnosis not present

## 2021-06-07 DIAGNOSIS — D485 Neoplasm of uncertain behavior of skin: Secondary | ICD-10-CM | POA: Diagnosis not present

## 2021-06-07 DIAGNOSIS — L821 Other seborrheic keratosis: Secondary | ICD-10-CM | POA: Diagnosis not present

## 2021-06-07 DIAGNOSIS — L812 Freckles: Secondary | ICD-10-CM | POA: Diagnosis not present

## 2021-06-13 ENCOUNTER — Other Ambulatory Visit: Payer: Self-pay | Admitting: Family Medicine

## 2021-06-20 ENCOUNTER — Other Ambulatory Visit: Payer: Medicare Other

## 2021-06-20 DIAGNOSIS — E7800 Pure hypercholesterolemia, unspecified: Secondary | ICD-10-CM | POA: Diagnosis not present

## 2021-06-20 DIAGNOSIS — R6889 Other general symptoms and signs: Secondary | ICD-10-CM | POA: Diagnosis not present

## 2021-06-20 DIAGNOSIS — E039 Hypothyroidism, unspecified: Secondary | ICD-10-CM | POA: Diagnosis not present

## 2021-06-21 ENCOUNTER — Other Ambulatory Visit: Payer: Self-pay

## 2021-06-21 ENCOUNTER — Encounter: Payer: Self-pay | Admitting: Family Medicine

## 2021-06-21 ENCOUNTER — Ambulatory Visit (INDEPENDENT_AMBULATORY_CARE_PROVIDER_SITE_OTHER): Payer: Medicare Other | Admitting: Family Medicine

## 2021-06-21 VITALS — BP 126/71 | HR 68 | Ht 69.0 in | Wt 182.0 lb

## 2021-06-21 DIAGNOSIS — E7800 Pure hypercholesterolemia, unspecified: Secondary | ICD-10-CM

## 2021-06-21 DIAGNOSIS — Z0000 Encounter for general adult medical examination without abnormal findings: Secondary | ICD-10-CM

## 2021-06-21 DIAGNOSIS — N400 Enlarged prostate without lower urinary tract symptoms: Secondary | ICD-10-CM

## 2021-06-21 DIAGNOSIS — Z0001 Encounter for general adult medical examination with abnormal findings: Secondary | ICD-10-CM | POA: Diagnosis not present

## 2021-06-21 DIAGNOSIS — E039 Hypothyroidism, unspecified: Secondary | ICD-10-CM | POA: Diagnosis not present

## 2021-06-21 LAB — LIPID PANEL
Chol/HDL Ratio: 3 ratio (ref 0.0–5.0)
Cholesterol, Total: 188 mg/dL (ref 100–199)
HDL: 62 mg/dL (ref >39)
LDL Chol Calc (NIH): 109 mg/dL — ABNORMAL HIGH (ref 0–99)
Triglycerides: 97 mg/dL (ref 0–149)
VLDL Cholesterol Cal: 17 mg/dL (ref 5–40)

## 2021-06-21 LAB — THYROID PANEL WITH TSH
Free Thyroxine Index: 1.8 (ref 1.2–4.9)
T3 Uptake Ratio: 24 % (ref 24–39)
T4, Total: 7.6 ug/dL (ref 4.5–12.0)
TSH: 9.18 u[IU]/mL — ABNORMAL HIGH (ref 0.450–4.500)

## 2021-06-21 LAB — VITAMIN B12: Vitamin B-12: 408 pg/mL (ref 232–1245)

## 2021-06-21 MED ORDER — HYOSCYAMINE SULFATE 0.125 MG SL SUBL: 0.1250 mg | SUBLINGUAL_TABLET | Freq: Three times a day (TID) | SUBLINGUAL | 3 refills | Status: DC | PRN

## 2021-06-21 MED ORDER — ATORVASTATIN CALCIUM 20 MG PO TABS: 20.0000 mg | ORAL_TABLET | Freq: Every day | ORAL | 3 refills | Status: DC

## 2021-06-21 MED ORDER — LEVOTHYROXINE SODIUM 88 MCG PO TABS: 88.0000 ug | ORAL_TABLET | Freq: Every day | ORAL | 3 refills | Status: DC

## 2021-06-21 NOTE — Progress Notes (Signed)
BP 126/71   Pulse 68   Ht _0  (1.753 m)   Wt 182 lb (82.6 kg)   SpO2 97%   BMI 26.88 kg/m    Subjective:   Patient ID: Anthony Monte., male    DOB: 29-Jul-1941, 80 y.o.   MRN: [id number]  HPI: Anthony Nazir. is a 80 y.o. male presenting on 06/21/2021 for Medical Management of Chronic Issues (CPE), Hypothyroidism, and Hyperlipidemia   HPI Annual well exam and physical Patient is coming in today for annual well exam and physical and recheck chronic issues.  He did have some blood work but not a full panel done just couple weeks ago.  He did show his thyroid is off.  He denies any major energy changes.  Hypothyroidism recheck Patient is coming in for thyroid recheck today as well. They deny any issues with hair changes or heat or cold problems or diarrhea or constipation. They deny any chest pain or palpitations. They are currently on levothyroxine 87mcrograms   Hyperlipidemia Patient is coming in for recheck of his hyperlipidemia. The patient is currently taking atorvastatin. They deny any issues with myalgias or history of liver damage from it. They deny any focal numbness or weakness or chest pain.   BPH Patient is coming in for recheck on BPH Symptoms: None currently Medication: None Last PSA: 1 year ago  Relevant past medical, surgical, family and social history reviewed and updated as indicated. Interim medical history since our last visit reviewed. Allergies and medications reviewed and updated.  Review of Systems  Constitutional:  Negative for chills and fever.  HENT:  Negative for ear pain and tinnitus.   Eyes:  Negative for pain and discharge.  Respiratory:  Negative for cough, shortness of breath and wheezing.   Cardiovascular:  Negative for chest pain, palpitations and leg swelling.  Gastrointestinal:  Negative for abdominal pain, blood in stool, constipation and diarrhea.  Genitourinary:  Negative for dysuria and hematuria.  Musculoskeletal:   Positive for arthralgias (Patient has some aches in joints especially of his left shoulder at times). Negative for back pain, gait problem and myalgias.  Skin:  Negative for rash.  Neurological:  Negative for dizziness, weakness and headaches.  Psychiatric/Behavioral:  Negative for suicidal ideas.   All other systems reviewed and are negative.  Per HPI unless specifically indicated above   Allergies as of 06/21/2021       Reactions   Bcg Live Other (See Comments)   Treatment pt had- shakes   Septra [bactrim] Other (See Comments)   Muscle weakness, numbness   Sulfa Antibiotics Other (See Comments)   Severe weakness   Pertussis Vaccines    History of Gillian barrie        Medication List        Accurate as of June 21, 2021  9:53 AM. If you have any questions, ask your nurse or doctor.          atorvastatin 20 MG tablet Commonly known as: LIPITOR TAKE 1 TABLET BY MOUTH  DAILY   BENADRYL ALLERGY PO Take by mouth as needed.   BIOTIN 5000 PO Take 5,000 mg by mouth daily.   brimonidine 0.2 % ophthalmic solution Commonly known as: ALPHAGAN Place into the right eye 2 (two) times daily.   cholecalciferol 1000 units tablet Commonly known as: VITAMIN D Take 5,000 Units by mouth daily.   CoQ10 200 MG Caps Take 1 capsule by mouth daily.   CRANBERRY PO Take 4,200  mg by mouth daily.   dorzolamide-timolol 22.3-6.8 MG/ML ophthalmic solution Commonly known as: COSOPT Place 1 drop into both eyes 2 (two) times daily.   fexofenadine 180 MG tablet Commonly known as: ALLEGRA Take 180 mg by mouth daily.   Fish Oil 1200 MG Caps Take by mouth.   Folivane-F 125-1 MG Caps Take 1 capsule by mouth once daily   hyoscyamine 0.125 MG SL tablet Commonly known as: LEVSIN SL DISSOLVE 1 TABLET IN MOUTH EVERY 8 HOURS AS NEEDED   latanoprost 0.005 % ophthalmic solution Commonly known as: XALATAN Place 1 drop into both eyes at bedtime.   levothyroxine 75 MCG tablet Commonly  known as: SYNTHROID TAKE 1 TABLET BY MOUTH  DAILY BEFORE BREAKFAST  EQUIVALENT TO EUTHYROX   Magnesium 500 MG Caps Take 1 capsule by mouth daily.   multivitamin with minerals Tabs tablet Take 1 tablet by mouth daily.         Objective:   BP 126/71   Pulse 68   Ht $R'5\' 9"'HL$  (1.753 m)   Wt 182 lb (82.6 kg)   SpO2 97%   BMI 26.88 kg/m   Wt Readings from Last 3 Encounters:  06/21/21 182 lb (82.6 kg)  12/14/20 180 lb 6.4 oz (81.8 kg)  06/15/20 177 lb (80.3 kg)    Physical Exam Vitals and nursing note reviewed.  Constitutional:      General: He is not in acute distress.    Appearance: He is well-developed. He is not diaphoretic.  HENT:     Right Ear: External ear normal.     Left Ear: External ear normal.     Nose: Nose normal.     Mouth/Throat:     Pharynx: No oropharyngeal exudate.  Eyes:     General: No scleral icterus.       Right eye: No discharge.     Conjunctiva/sclera: Conjunctivae normal.     Pupils: Pupils are equal, round, and reactive to light.  Neck:     Thyroid: No thyromegaly.  Cardiovascular:     Rate and Rhythm: Normal rate and regular rhythm.     Heart sounds: Normal heart sounds. No murmur heard. Pulmonary:     Effort: Pulmonary effort is normal. No respiratory distress.     Breath sounds: Normal breath sounds. No wheezing.  Abdominal:     General: Bowel sounds are normal. There is no distension.     Palpations: Abdomen is soft.     Tenderness: There is no abdominal tenderness. There is no guarding or rebound.  Genitourinary:    Prostate: Normal.     Rectum: Normal.  Musculoskeletal:        General: Normal range of motion.     Cervical back: Neck supple.  Lymphadenopathy:     Cervical: No cervical adenopathy.  Skin:    General: Skin is warm and dry.     Findings: No rash.  Neurological:     Mental Status: He is alert and oriented to person, place, and time.     Coordination: Coordination normal.  Psychiatric:        Behavior: Behavior  normal.    Results for orders placed or performed in visit on 06/20/21  Vitamin B12  Result Value Ref Range   Vitamin B-12 408 232 - 1,245 pg/mL  Thyroid Panel With TSH  Result Value Ref Range   TSH 9.180 (H) 0.450 - 4.500 uIU/mL   T4, Total 7.6 4.5 - 12.0 ug/dL   T3 Uptake Ratio 24  24 - 39 %   Free Thyroxine Index 1.8 1.2 - 4.9  Lipid Panel  Result Value Ref Range   Cholesterol, Total 188 100 - 199 mg/dL   Triglycerides 97 0 - 149 mg/dL   HDL 62 >39 mg/dL   VLDL Cholesterol Cal 17 5 - 40 mg/dL   LDL Chol Calc (NIH) 109 (H) 0 - 99 mg/dL   Chol/HDL Ratio 3.0 0.0 - 5.0 ratio    Assessment & Plan:   Problem List Items Addressed This Visit       Endocrine   Hypothyroidism   Relevant Medications   levothyroxine (SYNTHROID) 88 MCG tablet     Genitourinary   BPH (benign prostatic hyperplasia)   Relevant Orders   PSA, total and free     Other   Hyperlipidemia   Relevant Medications   atorvastatin (LIPITOR) 20 MG tablet   Other Relevant Orders   CMP14+EGFR   Other Visit Diagnoses     Well adult exam    -  Primary   Relevant Orders   CBC with Differential/Platelet   CMP14+EGFR       Thyroid was off, will adjust his thyroid medicine up to 88 mcg.  Continue other medicine, will check the rest of his blood work today. Follow up plan: Return in about 1 year (around 06/21/2022), or if symptoms worsen or fail to improve, for Physical exam.  Counseling provided for all of the vaccine components No orders of the defined types were placed in this encounter.   Caryl Pina, MD [facility] 06/21/2021, 9:53 AM

## 2021-06-22 LAB — CBC WITH DIFFERENTIAL/PLATELET
Basophils Absolute: 0.1 10*3/uL (ref 0.0–0.2)
Basos: 1 %
EOS (ABSOLUTE): 0.1 10*3/uL (ref 0.0–0.4)
Eos: 1 %
Hematocrit: 39.9 % (ref 37.5–51.0)
Hemoglobin: 13 g/dL (ref 13.0–17.7)
Immature Grans (Abs): 0 10*3/uL (ref 0.0–0.1)
Immature Granulocytes: 0 %
Lymphocytes Absolute: 1.7 10*3/uL (ref 0.7–3.1)
Lymphs: 24 %
MCH: 29.1 pg (ref 26.6–33.0)
MCHC: 32.6 g/dL (ref 31.5–35.7)
MCV: 89 fL (ref 79–97)
Monocytes Absolute: 0.7 10*3/uL (ref 0.1–0.9)
Monocytes: 10 %
Neutrophils Absolute: 4.5 10*3/uL (ref 1.4–7.0)
Neutrophils: 64 %
Platelets: 206 10*3/uL (ref 150–450)
RBC: 4.47 x10E6/uL (ref 4.14–5.80)
RDW: 13.4 % (ref 11.6–15.4)
WBC: 7 10*3/uL (ref 3.4–10.8)

## 2021-06-22 LAB — CMP14+EGFR
ALT: 19 IU/L (ref 0–44)
AST: 25 IU/L (ref 0–40)
Albumin/Globulin Ratio: 2 (ref 1.2–2.2)
Albumin: 4.3 g/dL (ref 3.7–4.7)
Alkaline Phosphatase: 64 IU/L (ref 44–121)
BUN/Creatinine Ratio: 15 (ref 10–24)
BUN: 19 mg/dL (ref 8–27)
Bilirubin Total: 0.4 mg/dL (ref 0.0–1.2)
CO2: 21 mmol/L (ref 20–29)
Calcium: 9.4 mg/dL (ref 8.6–10.2)
Chloride: 101 mmol/L (ref 96–106)
Creatinine, Ser: 1.23 mg/dL (ref 0.76–1.27)
Globulin, Total: 2.2 g/dL (ref 1.5–4.5)
Glucose: 96 mg/dL (ref 65–99)
Potassium: 4.8 mmol/L (ref 3.5–5.2)
Sodium: 140 mmol/L (ref 134–144)
Total Protein: 6.5 g/dL (ref 6.0–8.5)
eGFR: 60 mL/min/{1.73_m2} (ref >59)

## 2021-06-22 LAB — PSA, TOTAL AND FREE
PSA, Free Pct: 30 %
PSA, Free: 0.18 ng/mL
Prostate Specific Ag, Serum: 0.6 ng/mL (ref 0.0–4.0)

## 2021-07-20 ENCOUNTER — Encounter: Payer: Self-pay | Admitting: *Deleted

## 2021-07-23 DIAGNOSIS — D485 Neoplasm of uncertain behavior of skin: Secondary | ICD-10-CM | POA: Diagnosis not present

## 2021-07-23 DIAGNOSIS — L988 Other specified disorders of the skin and subcutaneous tissue: Secondary | ICD-10-CM | POA: Diagnosis not present

## 2021-07-26 ENCOUNTER — Telehealth: Payer: Self-pay | Admitting: Family Medicine

## 2021-07-26 NOTE — Telephone Encounter (Signed)
Spoke to wife - they noticed on his lumbar x-rays that it was noted there is advanced atherosclerotic calcification of the aorta. They would like to know if there is anything that is needed to be done or any further testing that should be done at this time. She also wanted to know if his CRP has been checked or if it needs to be checked.  Please review and advise.

## 2021-07-30 NOTE — Telephone Encounter (Signed)
As far as advanced atherosclerotic plaque of the aorta, there is nothing intervention wise that would be done, they would not stent it or cleaned it out but the best thing that can be done is prevention from worsening which is taking cholesterol medicine and change in diet and eating healthy and not smoking and being as physically active as he can.

## 2021-07-30 NOTE — Telephone Encounter (Signed)
Lmtcb.

## 2021-07-30 NOTE — Telephone Encounter (Signed)
Wife is returning nurse call. Please call wife at [phone]

## 2021-07-31 NOTE — Telephone Encounter (Signed)
Spoke with wife. She is aware of Dr. Merita Norton recommendations. States that pt is not going to change his diet, but he will continue to exercise and take his cholesterol medication.

## 2021-07-31 NOTE — Progress Notes (Signed)
[facility] Note  08/02/2021     CHIEF COMPLAINT Patient presents for Retina Follow Up   HISTORY OF PRESENT ILLNESS: Anthony Noble. is a 80 y.o. male who presents to the clinic today for:  HPI     Retina Follow Up   Patient presents with  Other.  In left eye.  This started 4 months ago.  I, the attending physician,  performed the HPI with the patient and updated documentation appropriately.        Comments   Patient here for 4 months retina follow up for retinoschisis OS. Patient states vision doing fine. Close up could be better. No eye pain. Using gtts.      Last edited by Bernarda Caffey, MD on 08/02/2021  8:22 AM.    Pt states he is having problems with glare when the sun hits his dashboard, no problems driving at night  Referring physician: Gwenyth Ober, MD [place],  [place] [id number]  HISTORICAL INFORMATION:   Selected notes from the MEDICAL RECORD NUMBER Referred by [facility] for eval of edema   CURRENT MEDICATIONS: Current Outpatient Medications (Ophthalmic Drugs)  Medication Sig   brimonidine (ALPHAGAN) 0.2 % ophthalmic solution Place into the right eye 2 (two) times daily.   dorzolamide-timolol (COSOPT) 22.3-6.8 MG/ML ophthalmic solution Place 1 drop into both eyes 2 (two) times daily.   latanoprost (XALATAN) 0.005 % ophthalmic solution Place 1 drop into both eyes at bedtime.   No current facility-administered medications for this visit. (Ophthalmic Drugs)   Current Outpatient Medications (Other)  Medication Sig   atorvastatin (LIPITOR) 20 MG tablet Take 1 tablet (20 mg total) by mouth daily.   BIOTIN 5000 PO Take 5,000 mg by mouth daily.   cholecalciferol (VITAMIN D) 1000 units tablet Take 5,000 Units by mouth daily.    Coenzyme Q10 (COQ10) 200 MG CAPS Take 1 capsule by mouth daily.   CRANBERRY PO Take 4,200 mg by mouth daily.   diphenhydrAMINE HCl (BENADRYL ALLERGY PO)  Take by mouth as needed.   Fe Fum-FePoly-FA-Vit C-Vit B3 (FOLIVANE-F) 125-1 MG CAPS Take 1 capsule by mouth once daily   fexofenadine (ALLEGRA) 180 MG tablet Take 180 mg by mouth daily.   hyoscyamine (LEVSIN SL) 0.125 MG SL tablet Take 1 tablet (0.125 mg total) by mouth every 8 (eight) hours as needed.   levothyroxine (SYNTHROID) 88 MCG tablet Take 1 tablet (88 mcg total) by mouth daily before breakfast.   Magnesium 500 MG CAPS Take 1 capsule by mouth daily.   Multiple Vitamin (MULTIVITAMIN WITH MINERALS) TABS tablet Take 1 tablet by mouth daily.   Omega-3 Fatty Acids (FISH OIL) 1200 MG CAPS Take by mouth.   No current facility-administered medications for this visit. (Other)   REVIEW OF SYSTEMS: ROS   Positive for: Eyes Negative for: Constitutional, Gastrointestinal, Neurological, Skin, Genitourinary, Musculoskeletal, HENT, Endocrine, Cardiovascular, Respiratory, Psychiatric, Allergic/Imm, Heme/Lymph Last edited by Theodore Demark, COA on 08/02/2021  8:10 AM.     ALLERGIES Allergies  Allergen Reactions   Bcg Live Other (See Comments)    Treatment pt had- shakes   Septra [Bactrim] Other (See Comments)    Muscle weakness, numbness   Sulfa Antibiotics Other (See Comments)    Severe weakness   Pertussis Vaccines     History of Blenda Bridegroom     PAST MEDICAL HISTORY Past Medical History:  Diagnosis Date   Bilateral carotid artery stenosis without cerebral infarction  Asymptomatic---bilateral ICA <40%  and mild stenosis bilateral ECA   BPH (benign prostatic hypertrophy) with urinary obstruction    Cancer ([place]) 2004   bladder   Diverticulosis of colon    Glaucoma    Guillain Barr syndrome [facility]) 2001   Heart murmur March /  April 2016   History of bladder cancer    first dx 2004--  tx with chemo bladder instillation   History of colon polyps    History of pleural effusion    2001   History of vertebral fracture    2001--   T12   Hyperlipidemia    Hypothyroidism     Pneumonia 2001   Varicose veins    Past Surgical History:  Procedure Laterality Date   CYSTO/  BILATERAL RETROGRADE PYLOGRAM/  RESECTION BLADDER TUMOR  07-30-2011   CYSTOSCOPY WITH BIOPSY N/A 01/12/2015   Procedure: CYSTOSCOPY WITH BLADDER  BIOPSY, FULGERATION;  Surgeon: Malka So, MD;  Location: [facility];  Service: Urology;  Laterality: N/A;   NEGATIVE SLEEP STUDY  YRS AGO per pt   ORIF RIGHT DISTAL RADIUS FX  12-31-2007   TONSILLECTOMY  as child   TRANSURETHRAL RESECTION OF BLADDER TUMOR WITH MITOMYCIN-C  06-07-2003   VIDEO ASSISTED THORACOSCOPY (VATS)/EMPYEMA Right 05-01-2000   and Drainage of effusion    FAMILY HISTORY Family History  Problem Relation Age of Onset   Other Mother        varicose veins   Cancer Mother        Ovarian   Varicose Veins Mother    Nephrolithiasis Mother    COPD Father    Hyperlipidemia Father    Cancer Maternal Grandmother    Bipolar disorder Daughter    Healthy Daughter    Hypertension Sister    Healthy Daughter    Colon cancer Neg Hx     SOCIAL HISTORY Social History   Tobacco Use   Smoking status: Former    Packs/day: 1.00    Years: 20.00    Pack years: 20.00    Types: Cigarettes    Quit date: 12/09/1981    Years since quitting: 39.6   Smokeless tobacco: Never  Vaping Use   Vaping Use: Never used  Substance Use Topics   Alcohol use: Yes    Alcohol/week: 2.0 standard drinks    Types: 1 Shots of liquor, 1 Standard drinks or equivalent per week   Drug use: No         OPHTHALMIC EXAM:  Base Eye Exam     Visual Acuity (Snellen - Linear)       Right Left   Dist cc 20/20 -2 20/25 -2   Dist ph cc  20/20 -2    Correction: Glasses         Tonometry (Tonopen, 8:07 AM)       Right Left   Pressure 14 16         Pupils       Dark Light Shape React APD   Right 3 2 Round Minimal None   Left 4 3 Round Minimal None         Visual Fields (Counting fingers)       Left Right    Full Full          Extraocular Movement       Right Left    Full, Ortho Full, Ortho         Neuro/Psych     Oriented x3: Yes  Mood/Affect: Normal         Dilation     Both eyes: 1.0% Mydriacyl, 2.5% Phenylephrine @ 8:07 AM           Slit Lamp and Fundus Exam     Slit Lamp Exam       Right Left   Lids/Lashes Dermatochalasis - upper lid Dermatochalasis - upper lid, mild MGD   Conjunctiva/Sclera White and quiet Mild nasal and temporal pinguecula   Cornea mild arcus, 1-2+ PEE, trace tear film debris mild arcus, trace PEE, trace tear film debris   Anterior Chamber Deep and quiet Deep and quiet   Iris Round and dilated Round and dilated   Lens 2+ Nuclear sclerosis, 2+ Cortical cataract 2-3+ Nuclear sclerosis, 2-3+ Cortical cataract   Vitreous Vitreous syneresis, Posterior vitreous detachment Vitreous syneresis, Posterior vitreous detachment         Fundus Exam       Right Left   Disc Pink and Sharp, +cupping, mild PPA, Thin superiorrim Pink and Sharp, mild elevation superiorly, mild peripapillary schisis superior to disc -- stable   C/D Ratio 0.8 0.4   Macula Flat, Blunted foveal reflex, RPE mottling, No heme or edema Flat, Blunted foveal reflex, RPE mottling, No heme or edema   Vessels mild attenuation, mild tortuousity mild attenuation, mild tortuousity   Periphery Attached, mild reticular degeneration, No heme  Attached, mild reticular degeneration, No heme             IMAGING AND PROCEDURES  Imaging and Procedures for 08/02/2021  OCT, Retina - OU - Both Eyes       Right Eye Quality was good. Central Foveal Thickness: 294. Progression has been stable. Findings include normal foveal contour, no IRF, no SRF.   Left Eye Quality was good. Central Foveal Thickness: 290. Progression has been stable. Findings include normal foveal contour, no SRF, intraretinal fluid (Foveal notch, Peripapillary edema superior to disc -- multilaminar schisis -- stable from prior).    Notes *Images captured and stored on drive  Diagnosis / Impression:  OD: NFP, no IRF/SRF  OS: NFP, no SRF, +IRF -- Peripapillary edema superior to disc -- multilaminar schisis -- stable from prior  Clinical management:  See below  Abbreviations: NFP - Normal foveal profile. CME - cystoid macular edema. PED - pigment epithelial detachment. IRF - intraretinal fluid. SRF - subretinal fluid. EZ - ellipsoid zone. ERM - epiretinal membrane. ORA - outer retinal atrophy. ORT - outer retinal tubulation. SRHM - subretinal hyper-reflective material. IRHM - intraretinal hyper-reflective material            ASSESSMENT/PLAN:    ICD-10-CM   1. 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 Retinal edema  H35.81 OCT, Retina - OU - Both Eyes    3. Essential hypertension  I10     4. Hypertensive retinopathy of both eyes  H35.033     5. Combined forms of age-related cataract of both eyes  H25.813     6. Low-tension glaucoma of both eyes, unspecified glaucoma stage  H40.1230        1,2. Retinoschisis OS -- stable  - focal peripapillary patch of multilaminar retinoschisis superior to disc  - OCT shows peripapillary schisis -- stable from prior  - FA 3.17.22 -- no leakage or CNV  - discussed findings, prognosis  - no retinal / ocular intervention indicated or recommended  - monitor  - f/u in 6-9 months -- DFE/OCT  3,4. Hypertensive  retinopathy OU - discussed importance of tight BP control - monitor  5. Mixed Cataract OU - The symptoms of cataract, surgical options, and treatments and risks were discussed with patient. - discussed diagnosis and progression - not yet visually significant - monitor for now  6. Low tension glaucoma OU  - under the expert management of Dr. Ander Slade  - IOP today: 14,16  - on Cosopt BID OU, latanoprost QHS OU, brimonidine BID OD  Ophthalmic Meds Ordered this visit:  No orders of the defined types were placed in this encounter.      Return for f/u 6-9 months, retinoschisis OS, DFE, OCT.  There are no Patient Instructions on file for this visit.   Explained the diagnoses, plan, and follow up with the patient and they expressed understanding.  Patient expressed understanding of the importance of proper follow up care.   This document serves as a record of services personally performed by Gardiner Sleeper, MD, PhD. It was created on their behalf by San Jetty. Owens Shark, OA an ophthalmic technician. The creation of this record is the provider's dictation and/or activities during the visit.    Electronically signed by: San Jetty. Owens Shark, [place] 08.23.2022 2:52 PM  Gardiner Sleeper, M.D., Ph.D. Diseases & Surgery of the Retina and Vitreous [facility]  I have reviewed the above documentation for accuracy and completeness, and I agree with the above. Gardiner Sleeper, M.D., Ph.D. 08/07/21 2:52 PM  Abbreviations: M myopia (nearsighted); A astigmatism; H hyperopia (farsighted); P presbyopia; Mrx spectacle prescription;  CTL contact lenses; OD right eye; OS left eye; OU both eyes  XT exotropia; ET esotropia; PEK punctate epithelial keratitis; PEE punctate epithelial erosions; DES dry eye syndrome; MGD meibomian gland dysfunction; ATs artificial tears; PFAT's preservative free artificial tears; [place] nuclear sclerotic cataract; PSC posterior subcapsular cataract; ERM epi-retinal membrane; PVD posterior vitreous detachment; RD retinal detachment; DM diabetes mellitus; DR diabetic retinopathy; NPDR non-proliferative diabetic retinopathy; PDR proliferative diabetic retinopathy; CSME clinically significant macular edema; DME diabetic macular edema; dbh dot blot hemorrhages; CWS cotton wool spot; POAG primary open angle glaucoma; C/D cup-to-disc ratio; HVF humphrey visual field; GVF goldmann visual field; OCT optical coherence tomography; IOP intraocular pressure; BRVO Branch retinal vein occlusion; CRVO central retinal vein  occlusion; CRAO central retinal artery occlusion; BRAO branch retinal artery occlusion; RT retinal tear; SB scleral buckle; PPV pars plana vitrectomy; VH Vitreous hemorrhage; PRP panretinal laser photocoagulation; IVK intravitreal kenalog; VMT vitreomacular traction; MH Macular hole;  NVD neovascularization of the disc; NVE neovascularization elsewhere; AREDS age related eye disease study; ARMD age related macular degeneration; POAG primary open angle glaucoma; EBMD epithelial/anterior basement membrane dystrophy; ACIOL anterior chamber intraocular lens; IOL intraocular lens; PCIOL posterior chamber intraocular lens; Phaco/IOL phacoemulsification with intraocular lens placement; [place] photorefractive keratectomy; LASIK laser assisted in situ keratomileusis; HTN hypertension; DM diabetes mellitus; COPD chronic obstructive pulmonary disease

## 2021-08-02 ENCOUNTER — Encounter (INDEPENDENT_AMBULATORY_CARE_PROVIDER_SITE_OTHER): Payer: Self-pay | Admitting: Ophthalmology

## 2021-08-02 ENCOUNTER — Ambulatory Visit (INDEPENDENT_AMBULATORY_CARE_PROVIDER_SITE_OTHER): Payer: Medicare Other | Admitting: Ophthalmology

## 2021-08-02 ENCOUNTER — Other Ambulatory Visit: Payer: Self-pay

## 2021-08-02 DIAGNOSIS — H401230 Low-tension glaucoma, bilateral, stage unspecified: Secondary | ICD-10-CM | POA: Diagnosis not present

## 2021-08-02 DIAGNOSIS — H3581 Retinal edema: Secondary | ICD-10-CM | POA: Diagnosis not present

## 2021-08-02 DIAGNOSIS — H33192 Other retinoschisis and retinal cysts, left eye: Secondary | ICD-10-CM

## 2021-08-02 DIAGNOSIS — H25813 Combined forms of age-related cataract, bilateral: Secondary | ICD-10-CM | POA: Diagnosis not present

## 2021-08-02 DIAGNOSIS — H35033 Hypertensive retinopathy, bilateral: Secondary | ICD-10-CM | POA: Diagnosis not present

## 2021-08-02 DIAGNOSIS — I10 Essential (primary) hypertension: Secondary | ICD-10-CM | POA: Diagnosis not present

## 2021-08-29 ENCOUNTER — Telehealth: Payer: Self-pay | Admitting: Family Medicine

## 2021-08-29 NOTE — Telephone Encounter (Signed)
Left message for patient to call back and schedule Medicare Annual Wellness Visit (AWV) by video or phone  Last AWV 03/19/2018  Please schedule at any time with [facility] Health Advisor.  45-minute appointment  Any questions, please contact me at [phone]

## 2021-09-27 ENCOUNTER — Encounter: Payer: Self-pay | Admitting: Family Medicine

## 2021-09-27 ENCOUNTER — Ambulatory Visit (INDEPENDENT_AMBULATORY_CARE_PROVIDER_SITE_OTHER): Payer: Medicare Other | Admitting: Family Medicine

## 2021-09-27 DIAGNOSIS — J069 Acute upper respiratory infection, unspecified: Secondary | ICD-10-CM

## 2021-09-27 LAB — VERITOR FLU A/B WAIVED
Influenza A: NEGATIVE
Influenza B: NEGATIVE

## 2021-09-27 NOTE — Progress Notes (Signed)
Subjective:    Patient ID: Anthony Noble., male    DOB: 1941/06/14, 80 y.o.   MRN: [id number]   HPI: Anthony Noble. is a 80 y.o. male presenting for fever to 100.2 and chills onset this morning. Exposed to Covid via his daughter about 4 days ago. . Mild cough and sore throat started yesterday. No other sx noted. Daughter on Day 3 of Paxlovid. They live in the same home, but social distancing.   Depression screen [facility] 2/9 06/15/2020 05/02/2020 05/20/2019 04/29/2018 03/19/2018  Decreased Interest 0 0 0 0 0  Down, Depressed, Hopeless 0 0 0 0 0  PHQ - 2 Score 0 0 0 0 0     Relevant past medical, surgical, family and social history reviewed and updated as indicated.  Interim medical history since our last visit reviewed. Allergies and medications reviewed and updated.  ROS:  Review of Systems  Constitutional:  Positive for fatigue. Negative for fever.  Respiratory:  Negative for shortness of breath.   Cardiovascular:  Negative for chest pain.  Musculoskeletal:  Negative for arthralgias.  Skin:  Negative for rash.    Social History   Tobacco Use  Smoking Status Former   Packs/day: 1.00   Years: 20.00   Pack years: 20.00   Types: Cigarettes   Quit date: 12/09/1981   Years since quitting: 39.8  Smokeless Tobacco Never       Objective:     Wt Readings from Last 3 Encounters:  06/21/21 182 lb (82.6 kg)  12/14/20 180 lb 6.4 oz (81.8 kg)  06/15/20 177 lb (80.3 kg)     Exam deferred. Pt. Harboring due to COVID 19. Phone visit performed.   Assessment & Plan:   1. Viral URI     No orders of the defined types were placed in this encounter.   Orders Placed This Encounter  Procedures   Novel Coronavirus, NAA ([facility])    Order Specific Question:   Previously tested for COVID-19    Answer:   Yes    Order Specific Question:   Resident in a congregate (group) care setting    Answer:   No    Order Specific Question:   Is the patient student?    Answer:   No     Order Specific Question:   Employed in healthcare setting    Answer:   No    Order Specific Question:   Has patient completed COVID vaccination(s) (2 doses of Pfizer/Moderna 1 dose of [employer])    Answer:   Unknown    Order Specific Question:   Release to patient    Answer:   Immediate       Diagnoses and all orders for this visit:  Viral URI -     Novel Coronavirus, NAA ([facility])   Virtual Visit via telephone Note  I discussed the limitations, risks, security and privacy concerns of performing an evaluation and management service by telephone and the availability of in person appointments. The patient was identified with two identifiers. Pt.expressed understanding and agreed to proceed. Pt. Is at home. Dr. Livia Snellen is in his office.  Follow Up Instructions:   I discussed the assessment and treatment plan with the patient. The patient was provided an opportunity to ask questions and all were answered. The patient agreed with the plan and demonstrated an understanding of the instructions.   The patient was advised to call back or seek an in-person evaluation if the symptoms  worsen or if the condition fails to improve as anticipated.   Total minutes including chart review and phone contact time: 4   Follow up plan: Return if symptoms worsen or fail to improve.  Claretta Fraise, MD [place]

## 2021-09-27 NOTE — Progress Notes (Signed)
Hello Renn,  Your lab result is normal and/or stable.Some minor variations that are not significant are commonly marked abnormal, but do not represent any medical problem for you.  Best regards, Claretta Fraise, M.D.

## 2021-09-27 NOTE — Addendum Note (Signed)
Addended by: Thana Ates on: 09/27/2021 12:45 PM   Modules accepted: Orders

## 2021-09-28 ENCOUNTER — Encounter: Payer: Self-pay | Admitting: Family Medicine

## 2021-09-28 ENCOUNTER — Telehealth: Payer: Self-pay | Admitting: Family Medicine

## 2021-09-28 ENCOUNTER — Other Ambulatory Visit: Payer: Self-pay

## 2021-09-28 LAB — SARS-COV-2, NAA 2 DAY TAT

## 2021-09-28 LAB — NOVEL CORONAVIRUS, NAA: SARS-CoV-2, NAA: DETECTED — AB

## 2021-09-28 MED ORDER — MOLNUPIRAVIR EUA 200MG CAPSULE: 4.0000 | ORAL_CAPSULE | Freq: Two times a day (BID) | ORAL | 0 refills | Status: AC

## 2021-09-28 NOTE — Telephone Encounter (Signed)
Pts wife informed that Dettinger has sent in antiviral to [facility] in [place]. He may take Mucinex, Tylenol or IBU if needed.

## 2021-09-28 NOTE — Telephone Encounter (Signed)
Treating provider off.

## 2021-11-04 ENCOUNTER — Other Ambulatory Visit: Payer: Self-pay | Admitting: Family Medicine

## 2021-11-13 DIAGNOSIS — R6889 Other general symptoms and signs: Secondary | ICD-10-CM | POA: Diagnosis not present

## 2021-12-20 DIAGNOSIS — H401221 Low-tension glaucoma, left eye, mild stage: Secondary | ICD-10-CM | POA: Insufficient documentation

## 2021-12-20 DIAGNOSIS — H401213 Low-tension glaucoma, right eye, severe stage: Secondary | ICD-10-CM | POA: Insufficient documentation

## 2021-12-20 DIAGNOSIS — H25813 Combined forms of age-related cataract, bilateral: Secondary | ICD-10-CM | POA: Diagnosis not present

## 2021-12-21 DIAGNOSIS — H2513 Age-related nuclear cataract, bilateral: Secondary | ICD-10-CM | POA: Diagnosis not present

## 2021-12-21 DIAGNOSIS — H40033 Anatomical narrow angle, bilateral: Secondary | ICD-10-CM | POA: Diagnosis not present

## 2022-01-22 ENCOUNTER — Other Ambulatory Visit: Payer: Self-pay

## 2022-01-22 DIAGNOSIS — I6523 Occlusion and stenosis of bilateral carotid arteries: Secondary | ICD-10-CM

## 2022-01-31 ENCOUNTER — Other Ambulatory Visit: Payer: Self-pay

## 2022-01-31 ENCOUNTER — Ambulatory Visit (INDEPENDENT_AMBULATORY_CARE_PROVIDER_SITE_OTHER): Payer: Self-pay | Admitting: Physician Assistant

## 2022-01-31 ENCOUNTER — Ambulatory Visit (HOSPITAL_COMMUNITY)
Admission: RE | Admit: 2022-01-31 | Discharge: 2022-01-31 | Disposition: A | Discharge status: Home / Self Care | Payer: PPO | Source: Ambulatory Visit | Attending: Physician Assistant | Admitting: Physician Assistant

## 2022-01-31 ENCOUNTER — Encounter: Payer: Self-pay | Admitting: Physician Assistant

## 2022-01-31 VITALS — BP 136/67 | HR 62 | Temp 98.2°F | Resp 20 | Ht 69.0 in | Wt 183.6 lb

## 2022-01-31 DIAGNOSIS — I6523 Occlusion and stenosis of bilateral carotid arteries: Secondary | ICD-10-CM | POA: Insufficient documentation

## 2022-01-31 NOTE — Progress Notes (Signed)
History of Present Illness:  Patient is a 81 y.o. year old male who presents for evaluation of asymptomatic carotid stenosis.  The patient continues to denise symptoms of TIA, amaurosis, or stroke.  The duplex on his last visit demonstrated < 39% stenosis B ICA's.   He continues to be active with elliptical and golf on a daily basis and is medically managed on Statin and every other day 81 mg ASA.    Past Medical History:  Diagnosis Date   Bilateral carotid artery stenosis without cerebral infarction    Asymptomatic---bilateral ICA <40%  and mild stenosis bilateral ECA   BPH (benign prostatic hypertrophy) with urinary obstruction    Cancer ([place]) 2004   bladder   Diverticulosis of colon    Glaucoma    Guillain Barr syndrome ([place]) 2001   Heart murmur March /  April 2016   History of bladder cancer    first dx 2004--  tx with chemo bladder instillation   History of colon polyps    History of pleural effusion    2001   History of vertebral fracture    2001--   T12   Hyperlipidemia    Hypothyroidism    Pneumonia 2001   Varicose veins     Past Surgical History:  Procedure Laterality Date   CYSTO/  BILATERAL RETROGRADE PYLOGRAM/  RESECTION BLADDER TUMOR  07-30-2011   CYSTOSCOPY WITH BIOPSY N/A 01/12/2015   Procedure: CYSTOSCOPY WITH BLADDER  BIOPSY, FULGERATION;  Surgeon: Malka So, MD;  Location: [facility];  Service: Urology;  Laterality: N/A;   NEGATIVE SLEEP STUDY  YRS AGO per pt   ORIF RIGHT DISTAL RADIUS FX  12-31-2007   TONSILLECTOMY  as child   TRANSURETHRAL RESECTION OF BLADDER TUMOR WITH MITOMYCIN-C  06-07-2003   VIDEO ASSISTED THORACOSCOPY (VATS)/EMPYEMA Right 05-01-2000   and Drainage of effusion     Social History Social History   Tobacco Use   Smoking status: Former    Packs/day: 1.00    Years: 20.00    Pack years: 20.00    Types: Cigarettes    Quit date: 12/09/1981    Years since quitting: 40.1    Passive exposure: Never    Smokeless tobacco: Never  Vaping Use   Vaping Use: Never used  Substance Use Topics   Alcohol use: Yes    Alcohol/week: 2.0 standard drinks    Types: 1 Shots of liquor, 1 Standard drinks or equivalent per week   Drug use: No    Family History Family History  Problem Relation Age of Onset   Other Mother        varicose veins   Cancer Mother        Ovarian   Varicose Veins Mother    Nephrolithiasis Mother    COPD Father    Hyperlipidemia Father    Cancer Maternal Grandmother    Bipolar disorder Daughter    Healthy Daughter    Hypertension Sister    Healthy Daughter    Colon cancer Neg Hx     Allergies  Allergies  Allergen Reactions   Bcg Live Other (See Comments)    Treatment pt had- shakes   Septra [Bactrim] Other (See Comments)    Muscle weakness, numbness   Sulfa Antibiotics Other (See Comments)    Severe weakness   Sulfamethoxazole-Trimethoprim Other (See Comments)    Muscle weakness, numbness Muscle weakness, numbness   Pertussis Vaccines     History of Gillian barrie  Current Outpatient Medications  Medication Sig Dispense Refill   atorvastatin (LIPITOR) 20 MG tablet Take 1 tablet (20 mg total) by mouth daily. 90 tablet 3   BIOTIN 5000 PO Take 5,000 mg by mouth daily.     brimonidine (ALPHAGAN) 0.2 % ophthalmic solution Place into the right eye 2 (two) times daily.     cholecalciferol (VITAMIN D) 1000 units tablet Take 5,000 Units by mouth daily.      Coenzyme Q10 (COQ10) 200 MG CAPS Take 1 capsule by mouth daily.     CRANBERRY PO Take 4,200 mg by mouth daily.     diphenhydrAMINE HCl (BENADRYL ALLERGY PO) Take by mouth as needed.     dorzolamide-timolol (COSOPT) 22.3-6.8 MG/ML ophthalmic solution Place 1 drop into both eyes 2 (two) times daily.     Fe Fum-FePoly-FA-Vit C-Vit B3 (FOLIVANE-F) 125-1 MG CAPS Take 1 capsule by mouth once daily 90 capsule 1   fexofenadine (ALLEGRA) 180 MG tablet Take 180 mg by mouth daily.     hyoscyamine (LEVSIN SL)  0.125 MG SL tablet DISSOLVE 1 TABLET IN MOUTH EVERY 8 HOURS AS NEEDED 120 tablet 2   latanoprost (XALATAN) 0.005 % ophthalmic solution Place 1 drop into both eyes at bedtime.     levothyroxine (SYNTHROID) 88 MCG tablet Take 1 tablet (88 mcg total) by mouth daily before breakfast. 90 tablet 3   Multiple Vitamin (MULTIVITAMIN WITH MINERALS) TABS tablet Take 1 tablet by mouth daily.     Magnesium 500 MG CAPS Take 1 capsule by mouth daily.     Omega-3 Fatty Acids (FISH OIL) 1200 MG CAPS Take by mouth.     No current facility-administered medications for this visit.    ROS:   General:  No weight loss, Fever, chills  HEENT: No recent headaches, no nasal bleeding, no visual changes, no sore throat  Neurologic: No dizziness, blackouts, seizures. No recent symptoms of stroke or mini- stroke. No recent episodes of slurred speech, or temporary blindness.  Cardiac: No recent episodes of chest pain/pressure, no shortness of breath at rest.  No shortness of breath with exertion.  Denies history of atrial fibrillation or irregular heartbeat  Vascular: No history of rest pain in feet.  No history of claudication.  No history of non-healing ulcer, No history of DVT   Pulmonary: No home oxygen, no productive cough, no hemoptysis,  No asthma or wheezing  Musculoskeletal:  [ x] Arthritis, [ ]  Low back pain,  [ ]  Joint pain  Hematologic:No history of hypercoagulable state.  No history of easy bleeding.  No history of anemia  Gastrointestinal: No hematochezia or melena,  No gastroesophageal reflux, no trouble swallowing  Urinary: [ ]  chronic Kidney disease, [ ]  on HD - [ ]  MWF or [ ]  TTHS, [ ]  Burning with urination, [ ]  Frequent urination, [ ]  Difficulty urinating;   Skin: No rashes  Psychological: No history of anxiety,  No history of depression   Physical Examination  Vitals:   01/31/22 1022 01/31/22 1024  BP: (!) 141/76 136/67  Pulse: 62   Resp: 20   Temp: 98.2 F (36.8 C)   TempSrc:  Temporal   SpO2: 98%   Weight: 183 lb 9.6 oz (83.3 kg)   Height: 5\' 9"  (1.753 m)     Body mass index is 27.11 kg/m.  General:  Alert and oriented, no acute distress HEENT: Normal Neck: No bruit or JVD Pulmonary: Clear to auscultation bilaterally Cardiac: Regular Rate and Rhythm without murmur Gastrointestinal: Soft,  non-tender, non-distended, no mass, no scars Skin: No rash Extremity Pulses:  2+ radial bilaterally Musculoskeletal: No deformity or edema  Neurologic: Upper and lower extremity motor 5/5 and symmetric  DATA:     Right Carotid Findings:  +----------+--------+--------+--------+------------------+--------+              PSV cm/s EDV cm/s Stenosis Plaque Description Comments   +----------+--------+--------+--------+------------------+--------+   CCA Prox   99       26                                              +----------+--------+--------+--------+------------------+--------+   CCA Mid    83       23                                              +----------+--------+--------+--------+------------------+--------+   CCA Distal 86       24                                              +----------+--------+--------+--------+------------------+--------+   ICA Prox   96       22       1-39%    calcific                      +----------+--------+--------+--------+------------------+--------+   ICA Mid    110      25                                              +----------+--------+--------+--------+------------------+--------+   ICA Distal 55       20                                              +----------+--------+--------+--------+------------------+--------+   ECA        337      39       >50%     calcific                      +----------+--------+--------+--------+------------------+--------+   +----------+--------+-------+----------------+-------------------+              PSV cm/s EDV cms Describe         Arm Pressure (mmHG)    +----------+--------+-------+----------------+-------------------+   Subclavian 260              Multiphasic, WNL                       +----------+--------+-------+----------------+-------------------+   +---------+--------+--+--------+--+---------+   Vertebral PSV cm/s 54 EDV cm/s 12 Antegrade   +---------+--------+--+--------+--+---------+       Left Carotid Findings:  +----------+--------+--------+--------+------------------+--------+              PSV cm/s EDV cm/s Stenosis Plaque Description Comments   +----------+--------+--------+--------+------------------+--------+   CCA Prox   100  14                                              +----------+--------+--------+--------+------------------+--------+   CCA Mid    99       21                                              +----------+--------+--------+--------+------------------+--------+   CCA Distal 88       25                heterogenous                  +----------+--------+--------+--------+------------------+--------+   ICA Prox   83       23       1-39%    heterogenous                  +----------+--------+--------+--------+------------------+--------+   ICA Mid    67       22                                              +----------+--------+--------+--------+------------------+--------+   ICA Distal 43       12                                              +----------+--------+--------+--------+------------------+--------+   ECA        259      31       >50%     calcific                      +----------+--------+--------+--------+------------------+--------+   +----------+--------+--------+----------------+-------------------+              PSV cm/s EDV cm/s Describe         Arm Pressure (mmHG)   +----------+--------+--------+----------------+-------------------+   Subclavian 109               Multiphasic, WNL                       +----------+--------+--------+----------------+-------------------+    +---------+--------+--+--------+-+---------+   Vertebral PSV cm/s 52 EDV cm/s 9 Antegrade   +---------+--------+--+--------+-+---------+           Summary:  Right Carotid: Velocities in the right ICA are consistent with a 1-39%  stenosis.                 The ECA appears >50% stenosed.   Left Carotid: Velocities in the left ICA are consistent with a 1-39%  stenosis.                The ECA appears >50% stenosed.   Vertebrals:  Bilateral vertebral arteries demonstrate antegrade flow.  Subclavians: Normal flow hemodynamics were seen in bilateral subclavian               arteries.   ASSESSMENT/PLAN: Asymptomatic ICA stenosis  His carotid stenosis was picked up on physical exam by left-sided carotid bruit.  He has known cardiac murmur that radiates to the carotids on exam.   He remains asymptomatic for stroke or TIA.  The duplex demonstrates < 39% stenosis B ICA's.  He will f/u for repeat duplex in 1 year.  If he develops symptoms of stroke/TIA he will call [place] PA-C [facility]: [phone]  MD in clinic [place]

## 2022-02-05 ENCOUNTER — Other Ambulatory Visit: Payer: Self-pay | Admitting: *Deleted

## 2022-02-05 MED ORDER — LEVOTHYROXINE SODIUM 88 MCG PO TABS: 88.0000 ug | ORAL_TABLET | Freq: Every day | ORAL | 1 refills | Status: DC

## 2022-02-05 MED ORDER — ATORVASTATIN CALCIUM 20 MG PO TABS: 20.0000 mg | ORAL_TABLET | Freq: Every day | ORAL | 1 refills | Status: DC

## 2022-03-19 ENCOUNTER — Telehealth: Payer: Self-pay | Admitting: Family Medicine

## 2022-03-19 DIAGNOSIS — E039 Hypothyroidism, unspecified: Secondary | ICD-10-CM

## 2022-03-19 DIAGNOSIS — N400 Enlarged prostate without lower urinary tract symptoms: Secondary | ICD-10-CM

## 2022-03-19 DIAGNOSIS — E7800 Pure hypercholesterolemia, unspecified: Secondary | ICD-10-CM

## 2022-03-19 DIAGNOSIS — Z0000 Encounter for general adult medical examination without abnormal findings: Secondary | ICD-10-CM

## 2022-03-19 NOTE — Telephone Encounter (Signed)
Patient wants to come in to get his labs before appt on 7/17. Please add.  ?

## 2022-03-19 NOTE — Telephone Encounter (Signed)
Lab orders placed.  

## 2022-04-11 DIAGNOSIS — H10013 Acute follicular conjunctivitis, bilateral: Secondary | ICD-10-CM | POA: Diagnosis not present

## 2022-04-24 NOTE — Progress Notes (Signed)
[facility] Note  05/07/2022     CHIEF COMPLAINT Patient presents for Retina Follow Up    HISTORY OF PRESENT ILLNESS: Anthony Fireman Teejay Meader. is a 81 y.o. male who presents to the clinic today for:  HPI     Retina Follow Up   Patient presents with  Other.  In left eye.  This started years ago.  Duration of 9 months.  I, the attending physician,  performed the HPI with the patient and updated documentation appropriately.        Comments   Patient states that the vision is the same since his last visit 9 months ago. He is still using Cosopt OU BID, Brimonidine OD TID, and Rocklatan OU BID.      Last edited by Bernarda Caffey, MD on 05/07/2022  8:50 AM.     Pt states he had an appt with Dr. Ander Slade and his IOP was up 4 points, so Dr. Ander Slade increased his brimonidine to TID, he states last night, he realized he had been using an allergy eye drop instead of Rocklatan, pt states about 3 weeks ago he had an infection in both eye, he saw Dr. Marin Comment and was given an ointment to use   Referring physician: Gwenyth Ober, MD [place],  [place] [address]  HISTORICAL INFORMATION:   Selected notes from the MEDICAL RECORD NUMBER Referred by [facility] for eval of edema   CURRENT MEDICATIONS: Current Outpatient Medications (Ophthalmic Drugs)  Medication Sig   brimonidine (ALPHAGAN) 0.2 % ophthalmic solution Place into the right eye 2 (two) times daily.   dorzolamide-timolol (COSOPT) 22.3-6.8 MG/ML ophthalmic solution Place 1 drop into both eyes 2 (two) times daily.   latanoprost (XALATAN) 0.005 % ophthalmic solution Place 1 drop into both eyes at bedtime.   No current facility-administered medications for this visit. (Ophthalmic Drugs)   Current Outpatient Medications (Other)  Medication Sig   atorvastatin (LIPITOR) 20 MG tablet Take 1 tablet (20 mg total) by mouth daily.   BIOTIN 5000 PO Take 5,000 mg by mouth daily.    cholecalciferol (VITAMIN D) 1000 units tablet Take 5,000 Units by mouth daily.    Coenzyme Q10 (COQ10) 200 MG CAPS Take 1 capsule by mouth daily.   CRANBERRY PO Take 4,200 mg by mouth daily.   diphenhydrAMINE HCl (BENADRYL ALLERGY PO) Take by mouth as needed.   Fe Fum-FePoly-FA-Vit C-Vit B3 (FOLIVANE-F) 125-1 MG CAPS Take 1 capsule by mouth once daily   fexofenadine (ALLEGRA) 180 MG tablet Take 180 mg by mouth daily.   hyoscyamine (LEVSIN SL) 0.125 MG SL tablet DISSOLVE 1 TABLET IN MOUTH EVERY 8 HOURS AS NEEDED   levothyroxine (SYNTHROID) 88 MCG tablet Take 1 tablet (88 mcg total) by mouth daily before breakfast.   Magnesium 500 MG CAPS Take 1 capsule by mouth daily.   Multiple Vitamin (MULTIVITAMIN WITH MINERALS) TABS tablet Take 1 tablet by mouth daily.   Omega-3 Fatty Acids (FISH OIL) 1200 MG CAPS Take by mouth.   No current facility-administered medications for this visit. (Other)   REVIEW OF SYSTEMS: ROS   Positive for: Eyes Negative for: Constitutional, Gastrointestinal, Neurological, Skin, Genitourinary, Musculoskeletal, HENT, Endocrine, Cardiovascular, Respiratory, Psychiatric, Allergic/Imm, Heme/Lymph Last edited by Annie Paras, COT on 05/07/2022  8:06 AM.     ALLERGIES Allergies  Allergen Reactions   Bcg Live Other (See Comments)    Treatment pt had- shakes   Septra [Bactrim] Other (See Comments)  Muscle weakness, numbness   Sulfa Antibiotics Other (See Comments)    Severe weakness   Sulfamethoxazole-Trimethoprim Other (See Comments)    Muscle weakness, numbness Muscle weakness, numbness   Pertussis Vaccines     History of Gillian barrie    PAST MEDICAL HISTORY Past Medical History:  Diagnosis Date   Bilateral carotid artery stenosis without cerebral infarction    Asymptomatic---bilateral ICA <40%  and mild stenosis bilateral ECA   BPH (benign prostatic hypertrophy) with urinary obstruction    Cancer ([place]) 2004   bladder   Diverticulosis of colon     Glaucoma    Guillain Barr syndrome ([place]) 2001   Heart murmur March /  April 2016   History of bladder cancer    first dx 2004--  tx with chemo bladder instillation   History of colon polyps    History of pleural effusion    2001   History of vertebral fracture    2001--   T12   Hyperlipidemia    Hypothyroidism    Pneumonia 2001   Varicose veins    Past Surgical History:  Procedure Laterality Date   CYSTO/  BILATERAL RETROGRADE PYLOGRAM/  RESECTION BLADDER TUMOR  07-30-2011   CYSTOSCOPY WITH BIOPSY N/A 01/12/2015   Procedure: CYSTOSCOPY WITH BLADDER  BIOPSY, FULGERATION;  Surgeon: Malka So, MD;  Location: [facility];  Service: Urology;  Laterality: N/A;   NEGATIVE SLEEP STUDY  YRS AGO per pt   ORIF RIGHT DISTAL RADIUS FX  12-31-2007   TONSILLECTOMY  as child   TRANSURETHRAL RESECTION OF BLADDER TUMOR WITH MITOMYCIN-C  06-07-2003   VIDEO ASSISTED THORACOSCOPY (VATS)/EMPYEMA Right 05-01-2000   and Drainage of effusion   FAMILY HISTORY Family History  Problem Relation Age of Onset   Other Mother        varicose veins   Cancer Mother        Ovarian   Varicose Veins Mother    Nephrolithiasis Mother    COPD Father    Hyperlipidemia Father    Cancer Maternal Grandmother    Bipolar disorder Daughter    Healthy Daughter    Hypertension Sister    Healthy Daughter    Colon cancer Neg Hx    SOCIAL HISTORY Social History   Tobacco Use   Smoking status: Former    Packs/day: 1.00    Years: 20.00    Pack years: 20.00    Types: Cigarettes    Quit date: 12/09/1981    Years since quitting: 40.4    Passive exposure: Never   Smokeless tobacco: Never  Vaping Use   Vaping Use: Never used  Substance Use Topics   Alcohol use: Yes    Alcohol/week: 2.0 standard drinks    Types: 1 Shots of liquor, 1 Standard drinks or equivalent per week   Drug use: No       OPHTHALMIC EXAM:  Base Eye Exam     Visual Acuity (Snellen - Linear)       Right Left   Dist cc  20/20 +1 20/25    Correction: Glasses         Tonometry (Tonopen, 8:18 AM)       Right Left   Pressure 14 16         Pupils       Dark Light Shape React APD   Right 3 2 Round Minimal None   Left 4 3 Round Minimal None         Visual  Fields       Left Right    Full Full         Extraocular Movement       Right Left    Full, Ortho Full, Ortho         Neuro/Psych     Oriented x3: Yes   Mood/Affect: Normal         Dilation     Both eyes: 1.0% Mydriacyl, 2.5% Phenylephrine @ 8:14 AM           Slit Lamp and Fundus Exam     Slit Lamp Exam       Right Left   Lids/Lashes Dermatochalasis - upper lid Dermatochalasis - upper lid, mild MGD   Conjunctiva/Sclera 1+ Injection Mild nasal and temporal pinguecula   Cornea mild arcus, 1+ fine PEE, trace tear film debris mild arcus, trace PEE, trace tear film debris   Anterior Chamber moderate depth, narrow angles moderate depth, narrow angles   Iris Irregular dilation Round and dilated   Lens 2+ Nuclear sclerosis, 2+ Cortical cataract 2-3+ Nuclear sclerosis, 2-3+ Cortical cataract   Anterior Vitreous Vitreous syneresis, Posterior vitreous detachment Vitreous syneresis, Posterior vitreous detachment         Fundus Exam       Right Left   Disc 2-3+ pallor, Sharp, +cupping, mild PPA, Thin superior rim Pink and Sharp, mild peripapillary schisis superior to disc -- improved   C/D Ratio 0.8 0.6   Macula Flat, Blunted foveal reflex, RPE mottling, No heme or edema Flat, Blunted foveal reflex, RPE mottling, No heme or edema   Vessels attenuated, mild tortuosity attenuated, Tortuous   Periphery Attached, mild reticular degeneration, No heme Attached, mild reticular degeneration, No heme            Refraction     Wearing Rx       Sphere Cylinder Axis Add   Right +0.25 +1.00 026 +2.00   Left +0.75 +0.75 159 +2.00           IMAGING AND PROCEDURES  Imaging and Procedures for 05/07/2022  OCT, Retina  - OU - Both Eyes       Right Eye Quality was good. Central Foveal Thickness: 290. Progression has been stable. Findings include normal foveal contour, no IRF, no SRF.   Left Eye Quality was good. Central Foveal Thickness: 290. Progression has improved. Findings include normal foveal contour, no SRF, intraretinal fluid (multilaminar schisis above disc improved, now just trace schisis present).   Notes *Images captured and stored on drive  Diagnosis / Impression:  OD: NFP, no IRF/SRF  OS: NFP, no SRF, +IRF -- multilaminar schisis above disc improved, now just trace schisis present  Clinical management:  See below  Abbreviations: NFP - Normal foveal profile. CME - cystoid macular edema. PED - pigment epithelial detachment. IRF - intraretinal fluid. SRF - subretinal fluid. EZ - ellipsoid zone. ERM - epiretinal membrane. ORA - outer retinal atrophy. ORT - outer retinal tubulation. SRHM - subretinal hyper-reflective material. IRHM - intraretinal hyper-reflective material            ASSESSMENT/PLAN:    ICD-10-CM   1. Retinoschisis and retinal cysts of left eye  H33.192 OCT, Retina - OU - Both Eyes    2. Essential hypertension  I10     3. Hypertensive retinopathy of both eyes  H35.033 OCT, Retina - OU - Both Eyes    4. Combined forms of age-related cataract of both eyes  H25.813  5. Low-tension glaucoma of both eyes, unspecified glaucoma stage  H40.1230      1. Retinoschisis OS -- improved  - focal peripapillary patch of multilaminar retinoschisis superior to disc  - OCT shows multilaminar schisis above disc improved, now just trace schisis present  - FA 3.17.22 -- no leakage or CNV  - discussed findings, prognosis  - pt reports schisis presented shortly after severe fall on face/chin  - no retinal / ocular intervention indicated or recommended  - monitor  - f/u in 9-12 months -- DFE/OCT  2,3. Hypertensive retinopathy OU - discussed importance of tight BP control -  monitor  4. Mixed Cataract OU - The symptoms of cataract, surgical options, and treatments and risks were discussed with patient. - discussed diagnosis and progression - not yet visually significant - monitor  5. Low tension glaucoma OU  - under the expert management of Dr. Ander Slade  - IOP today: 14,16  - on Cosopt BID OU, Rocklatan QHS OU, Brimonidine inc TID OD  Ophthalmic Meds Ordered this visit:  No orders of the defined types were placed in this encounter.    Return for f/u 9-12 months, retinoschsis OS, DFE, OCT.  There are no Patient Instructions on file for this visit.   Explained the diagnoses, plan, and follow up with the patient and they expressed understanding.  Patient expressed understanding of the importance of proper follow up care.   This document serves as a record of services personally performed by Gardiner Sleeper, MD, PhD. It was created on their behalf by Bernarda Caffey, MD, an ophthalmic technician. The creation of this record is the provider's dictation and/or activities during the visit.    Electronically signed by: Bernarda Caffey, MD 04/24/22 8:52 AM   Gardiner Sleeper, M.D., Ph.D. Diseases & Surgery of the Retina and Vitreous [facility]  I have reviewed the above documentation for accuracy and completeness, and I agree with the above. Gardiner Sleeper, M.D., Ph.D. 05/07/22 8:56 AM  Abbreviations: M myopia (nearsighted); A astigmatism; H hyperopia (farsighted); P presbyopia; Mrx spectacle prescription;  CTL contact lenses; OD right eye; OS left eye; OU both eyes  XT exotropia; ET esotropia; PEK punctate epithelial keratitis; PEE punctate epithelial erosions; DES dry eye syndrome; MGD meibomian gland dysfunction; ATs artificial tears; PFAT's preservative free artificial tears; [place] nuclear sclerotic cataract; PSC posterior subcapsular cataract; ERM epi-retinal membrane; PVD posterior vitreous detachment; RD retinal detachment; DM diabetes  mellitus; DR diabetic retinopathy; NPDR non-proliferative diabetic retinopathy; PDR proliferative diabetic retinopathy; CSME clinically significant macular edema; DME diabetic macular edema; dbh dot blot hemorrhages; CWS cotton wool spot; POAG primary open angle glaucoma; C/D cup-to-disc ratio; HVF humphrey visual field; GVF goldmann visual field; OCT optical coherence tomography; IOP intraocular pressure; BRVO Branch retinal vein occlusion; CRVO central retinal vein occlusion; CRAO central retinal artery occlusion; BRAO branch retinal artery occlusion; RT retinal tear; SB scleral buckle; PPV pars plana vitrectomy; VH Vitreous hemorrhage; PRP panretinal laser photocoagulation; IVK intravitreal kenalog; VMT vitreomacular traction; MH Macular hole;  NVD neovascularization of the disc; NVE neovascularization elsewhere; AREDS age related eye disease study; ARMD age related macular degeneration; POAG primary open angle glaucoma; EBMD epithelial/anterior basement membrane dystrophy; ACIOL anterior chamber intraocular lens; IOL intraocular lens; PCIOL posterior chamber intraocular lens; Phaco/IOL phacoemulsification with intraocular lens placement; [place] photorefractive keratectomy; LASIK laser assisted in situ keratomileusis; HTN hypertension; DM diabetes mellitus; COPD chronic obstructive pulmonary disease

## 2022-05-02 DIAGNOSIS — H401221 Low-tension glaucoma, left eye, mild stage: Secondary | ICD-10-CM | POA: Diagnosis not present

## 2022-05-02 DIAGNOSIS — H25813 Combined forms of age-related cataract, bilateral: Secondary | ICD-10-CM | POA: Diagnosis not present

## 2022-05-02 DIAGNOSIS — H401213 Low-tension glaucoma, right eye, severe stage: Secondary | ICD-10-CM | POA: Diagnosis not present

## 2022-05-03 DIAGNOSIS — R351 Nocturia: Secondary | ICD-10-CM | POA: Diagnosis not present

## 2022-05-03 DIAGNOSIS — Z8551 Personal history of malignant neoplasm of bladder: Secondary | ICD-10-CM | POA: Diagnosis not present

## 2022-05-03 DIAGNOSIS — N401 Enlarged prostate with lower urinary tract symptoms: Secondary | ICD-10-CM | POA: Diagnosis not present

## 2022-05-07 ENCOUNTER — Encounter (INDEPENDENT_AMBULATORY_CARE_PROVIDER_SITE_OTHER): Payer: Self-pay | Admitting: Ophthalmology

## 2022-05-07 ENCOUNTER — Ambulatory Visit (INDEPENDENT_AMBULATORY_CARE_PROVIDER_SITE_OTHER): Payer: PPO | Admitting: Ophthalmology

## 2022-05-07 DIAGNOSIS — H401230 Low-tension glaucoma, bilateral, stage unspecified: Secondary | ICD-10-CM

## 2022-05-07 DIAGNOSIS — H35033 Hypertensive retinopathy, bilateral: Secondary | ICD-10-CM | POA: Diagnosis not present

## 2022-05-07 DIAGNOSIS — I10 Essential (primary) hypertension: Secondary | ICD-10-CM

## 2022-05-07 DIAGNOSIS — H33192 Other retinoschisis and retinal cysts, left eye: Secondary | ICD-10-CM | POA: Diagnosis not present

## 2022-05-07 DIAGNOSIS — H25813 Combined forms of age-related cataract, bilateral: Secondary | ICD-10-CM

## 2022-05-15 ENCOUNTER — Other Ambulatory Visit: Payer: Self-pay | Admitting: Family Medicine

## 2022-06-13 DIAGNOSIS — L812 Freckles: Secondary | ICD-10-CM | POA: Diagnosis not present

## 2022-06-13 DIAGNOSIS — L570 Actinic keratosis: Secondary | ICD-10-CM | POA: Diagnosis not present

## 2022-06-13 DIAGNOSIS — D225 Melanocytic nevi of trunk: Secondary | ICD-10-CM | POA: Diagnosis not present

## 2022-06-13 DIAGNOSIS — L821 Other seborrheic keratosis: Secondary | ICD-10-CM | POA: Diagnosis not present

## 2022-06-18 ENCOUNTER — Telehealth: Payer: Self-pay | Admitting: Family Medicine

## 2022-06-18 NOTE — Telephone Encounter (Signed)
Future orders have been placed.

## 2022-06-20 ENCOUNTER — Other Ambulatory Visit: Payer: PPO

## 2022-06-20 DIAGNOSIS — E7800 Pure hypercholesterolemia, unspecified: Secondary | ICD-10-CM | POA: Diagnosis not present

## 2022-06-20 DIAGNOSIS — Z0000 Encounter for general adult medical examination without abnormal findings: Secondary | ICD-10-CM

## 2022-06-20 DIAGNOSIS — N400 Enlarged prostate without lower urinary tract symptoms: Secondary | ICD-10-CM | POA: Diagnosis not present

## 2022-06-20 DIAGNOSIS — E039 Hypothyroidism, unspecified: Secondary | ICD-10-CM

## 2022-06-21 LAB — PSA, TOTAL AND FREE
PSA, Free Pct: 34 %
PSA, Free: 0.17 ng/mL
Prostate Specific Ag, Serum: 0.5 ng/mL (ref 0.0–4.0)

## 2022-06-21 LAB — CBC WITH DIFFERENTIAL/PLATELET
Basophils Absolute: 0.1 10*3/uL (ref 0.0–0.2)
Basos: 1 %
EOS (ABSOLUTE): 0.2 10*3/uL (ref 0.0–0.4)
Eos: 4 %
Hematocrit: 37.6 % (ref 37.5–51.0)
Hemoglobin: 12.5 g/dL — ABNORMAL LOW (ref 13.0–17.7)
Immature Grans (Abs): 0 10*3/uL (ref 0.0–0.1)
Immature Granulocytes: 0 %
Lymphocytes Absolute: 1.3 10*3/uL (ref 0.7–3.1)
Lymphs: 24 %
MCH: 28.9 pg (ref 26.6–33.0)
MCHC: 33.2 g/dL (ref 31.5–35.7)
MCV: 87 fL (ref 79–97)
Monocytes Absolute: 0.6 10*3/uL (ref 0.1–0.9)
Monocytes: 11 %
Neutrophils Absolute: 3.3 10*3/uL (ref 1.4–7.0)
Neutrophils: 60 %
Platelets: 206 10*3/uL (ref 150–450)
RBC: 4.32 x10E6/uL (ref 4.14–5.80)
RDW: 13.1 % (ref 11.6–15.4)
WBC: 5.5 10*3/uL (ref 3.4–10.8)

## 2022-06-21 LAB — LIPID PANEL
Chol/HDL Ratio: 3.2 ratio (ref 0.0–5.0)
Cholesterol, Total: 171 mg/dL (ref 100–199)
HDL: 54 mg/dL (ref >39)
LDL Chol Calc (NIH): 102 mg/dL — ABNORMAL HIGH (ref 0–99)
Triglycerides: 80 mg/dL (ref 0–149)
VLDL Cholesterol Cal: 15 mg/dL (ref 5–40)

## 2022-06-21 LAB — CMP14+EGFR
ALT: 17 IU/L (ref 0–44)
AST: 25 IU/L (ref 0–40)
Albumin/Globulin Ratio: 2.3 — ABNORMAL HIGH (ref 1.2–2.2)
Albumin: 4.3 g/dL (ref 3.8–4.8)
Alkaline Phosphatase: 65 IU/L (ref 44–121)
BUN/Creatinine Ratio: 16 (ref 10–24)
BUN: 17 mg/dL (ref 8–27)
Bilirubin Total: 0.5 mg/dL (ref 0.0–1.2)
CO2: 23 mmol/L (ref 20–29)
Calcium: 9.3 mg/dL (ref 8.6–10.2)
Chloride: 101 mmol/L (ref 96–106)
Creatinine, Ser: 1.09 mg/dL (ref 0.76–1.27)
Globulin, Total: 1.9 g/dL (ref 1.5–4.5)
Glucose: 94 mg/dL (ref 70–99)
Potassium: 4.1 mmol/L (ref 3.5–5.2)
Sodium: 139 mmol/L (ref 134–144)
Total Protein: 6.2 g/dL (ref 6.0–8.5)
eGFR: 69 mL/min/{1.73_m2} (ref >59)

## 2022-06-21 LAB — TSH: TSH: 5.07 u[IU]/mL — ABNORMAL HIGH (ref 0.450–4.500)

## 2022-06-24 ENCOUNTER — Ambulatory Visit (INDEPENDENT_AMBULATORY_CARE_PROVIDER_SITE_OTHER): Payer: PPO | Admitting: Family Medicine

## 2022-06-24 ENCOUNTER — Telehealth: Payer: Self-pay | Admitting: *Deleted

## 2022-06-24 ENCOUNTER — Encounter: Payer: Self-pay | Admitting: Family Medicine

## 2022-06-24 ENCOUNTER — Other Ambulatory Visit: Payer: Self-pay | Admitting: Family Medicine

## 2022-06-24 VITALS — BP 134/67 | HR 63 | Temp 98.0°F | Ht 69.0 in | Wt 179.0 lb

## 2022-06-24 DIAGNOSIS — Z8669 Personal history of other diseases of the nervous system and sense organs: Secondary | ICD-10-CM

## 2022-06-24 DIAGNOSIS — R351 Nocturia: Secondary | ICD-10-CM

## 2022-06-24 DIAGNOSIS — E7800 Pure hypercholesterolemia, unspecified: Secondary | ICD-10-CM

## 2022-06-24 DIAGNOSIS — N401 Enlarged prostate with lower urinary tract symptoms: Secondary | ICD-10-CM | POA: Diagnosis not present

## 2022-06-24 DIAGNOSIS — E039 Hypothyroidism, unspecified: Secondary | ICD-10-CM | POA: Diagnosis not present

## 2022-06-24 DIAGNOSIS — Z0000 Encounter for general adult medical examination without abnormal findings: Secondary | ICD-10-CM

## 2022-06-24 DIAGNOSIS — Z0001 Encounter for general adult medical examination with abnormal findings: Secondary | ICD-10-CM

## 2022-06-24 MED ORDER — TAMSULOSIN HCL 0.4 MG PO CAPS: 0.4000 mg | ORAL_CAPSULE | Freq: Every day | ORAL | 5 refills | Status: DC

## 2022-06-24 MED ORDER — FERROUS GLUCONATE 240 (27 FE) MG PO TABS: 240.0000 mg | ORAL_TABLET | Freq: Every day | ORAL | 3 refills | Status: DC

## 2022-06-24 MED ORDER — FOLIC ACID 400 MCG PO TABS: 400.0000 ug | ORAL_TABLET | Freq: Every day | ORAL | 3 refills | Status: DC

## 2022-06-24 MED ORDER — LEVOTHYROXINE SODIUM 88 MCG PO TABS: 88.0000 ug | ORAL_TABLET | Freq: Every day | ORAL | 3 refills | Status: DC

## 2022-06-24 MED ORDER — ATORVASTATIN CALCIUM 20 MG PO TABS: 20.0000 mg | ORAL_TABLET | Freq: Every day | ORAL | 3 refills | Status: DC

## 2022-06-24 NOTE — Progress Notes (Unsigned)
This is just a combination pill that has both iron and folic acid, I have sent iron and folic acid as separate pills, he could also take a multivitamin that has iron and folic acid and which would work the same as well over-the-counter.

## 2022-06-24 NOTE — Progress Notes (Signed)
BP 134/67   Pulse 63   Temp 98 F (36.7 C)   Ht 5' 9"  (1.753 m)   Wt 179 lb (81.2 kg)   SpO2 97%   BMI 26.43 kg/m    Subjective:   Patient ID: Anthony Monte., male    DOB: 1940/12/21, 81 y.o.   MRN: [id number]  HPI: Anthony Derego. is a 81 y.o. male presenting on 06/24/2022 for Medical Management of Chronic Issues (CPE), Nocturia, and irritability   HPI Physical exam Patient denies any chest pain, shortness of breath, headaches or vision issues, abdominal complaints, diarrhea, nausea, vomiting, or joint issues.  Patient's biggest complaint is that he does have some nocturia and wakes him up at night which then makes him irritable because he is not sleeping as well.  BPH Patient is coming in for recheck on BPH Symptoms: Nocturia and frequency Medication: None currently Last PSA: 0.5  Relevant past medical, surgical, family and social history reviewed and updated as indicated. Interim medical history since our last visit reviewed. Allergies and medications reviewed and updated.  Review of Systems  Constitutional:  Negative for chills and fever.  HENT:  Negative for ear pain and tinnitus.   Eyes:  Negative for pain and discharge.  Respiratory:  Negative for cough, shortness of breath and wheezing.   Cardiovascular:  Negative for chest pain, palpitations and leg swelling.  Gastrointestinal:  Negative for abdominal pain, blood in stool, constipation and diarrhea.  Genitourinary:  Negative for dysuria and hematuria.  Musculoskeletal:  Negative for back pain, gait problem and myalgias.  Skin:  Negative for rash.  Neurological:  Negative for dizziness, weakness and headaches.  Psychiatric/Behavioral:  Negative for suicidal ideas.   All other systems reviewed and are negative.   Per HPI unless specifically indicated above   Allergies as of 06/24/2022       Reactions   Bcg Live Other (See Comments)   Treatment pt had- shakes   Septra [bactrim] Other (See  Comments)   Muscle weakness, numbness   Sulfa Antibiotics Other (See Comments)   Severe weakness   Sulfamethoxazole-trimethoprim Other (See Comments)   Muscle weakness, numbness Muscle weakness, numbness   Pertussis Vaccines    History of Gillian barrie        Medication List        Accurate as of June 24, 2022  4:35 PM. If you have any questions, ask your nurse or doctor.          atorvastatin 20 MG tablet Commonly known as: LIPITOR Take 1 tablet (20 mg total) by mouth daily.   BENADRYL ALLERGY PO Take by mouth as needed.   BIOTIN 5000 PO Take 5,000 mg by mouth daily.   brimonidine 0.2 % ophthalmic solution Commonly known as: ALPHAGAN Place into the right eye 2 (two) times daily.   cholecalciferol 1000 units tablet Commonly known as: VITAMIN D Take 5,000 Units by mouth daily.   CoQ10 200 MG Caps Take 1 capsule by mouth daily.   CRANBERRY PO Take 4,200 mg by mouth daily.   dorzolamide-timolol 22.3-6.8 MG/ML ophthalmic solution Commonly known as: COSOPT Place 1 drop into both eyes 2 (two) times daily.   ferrous gluconate 240 (27 FE) MG tablet Commonly known as: FERGON Take 1 tablet (240 mg total) by mouth daily. Started by: Fransisca Kaufmann Zackeriah Kissler, MD   fexofenadine 180 MG tablet Commonly known as: ALLEGRA Take 180 mg by mouth daily.   Fish Oil 1200 MG Caps Take  by mouth.   folic acid 700 MCG tablet Commonly known as: V-R FOLIC ACID Take 1 tablet (400 mcg total) by mouth daily. Started by: Fransisca Kaufmann Toniya Rozar, MD   Folivane-F 125-1 MG Caps Take 1 capsule by mouth once daily   HAIR SKIN NAILS PO Take 1 tablet by mouth daily.   hyoscyamine 0.125 MG SL tablet Commonly known as: LEVSIN SL DISSOLVE 1 TABLET IN MOUTH EVERY 8 HOURS AS NEEDED   KRILL OIL PO Take 1 Capful by mouth daily.   latanoprost 0.005 % ophthalmic solution Commonly known as: XALATAN Place 1 drop into both eyes at bedtime.   levothyroxine 88 MCG tablet Commonly known as:  SYNTHROID Take 1 tablet (88 mcg total) by mouth daily before breakfast.   Magnesium 500 MG Caps Take 1 capsule by mouth daily.   multivitamin with minerals Tabs tablet Take 1 tablet by mouth daily.   tamsulosin 0.4 MG Caps capsule Commonly known as: FLOMAX Take 1 capsule (0.4 mg total) by mouth daily. Started by: Fransisca Kaufmann Miasha Emmons, MD         Objective:   BP 134/67   Pulse 63   Temp 98 F (36.7 C)   Ht 5' 9"  (1.753 m)   Wt 179 lb (81.2 kg)   SpO2 97%   BMI 26.43 kg/m   Wt Readings from Last 3 Encounters:  06/24/22 179 lb (81.2 kg)  01/31/22 183 lb 9.6 oz (83.3 kg)  06/21/21 182 lb (82.6 kg)    Physical Exam Vitals reviewed.  Constitutional:      General: He is not in acute distress.    Appearance: He is well-developed. He is not diaphoretic.  HENT:     Right Ear: External ear normal.     Left Ear: External ear normal.     Nose: Nose normal.     Mouth/Throat:     Pharynx: No oropharyngeal exudate.  Eyes:     General: No scleral icterus.    Conjunctiva/sclera: Conjunctivae normal.  Neck:     Thyroid: No thyromegaly.  Cardiovascular:     Rate and Rhythm: Normal rate and regular rhythm.     Heart sounds: Normal heart sounds. No murmur heard. Pulmonary:     Effort: Pulmonary effort is normal. No respiratory distress.     Breath sounds: Normal breath sounds. No wheezing.  Abdominal:     General: Bowel sounds are normal. There is no distension.     Palpations: Abdomen is soft.     Tenderness: There is no abdominal tenderness. There is no guarding or rebound.  Musculoskeletal:        General: Normal range of motion.     Cervical back: Neck supple.  Lymphadenopathy:     Cervical: No cervical adenopathy.  Skin:    General: Skin is warm and dry.     Findings: No rash.  Neurological:     Mental Status: He is alert and oriented to person, place, and time.     Coordination: Coordination normal.  Psychiatric:        Behavior: Behavior normal.     Results  for orders placed or performed in visit on 06/20/22  CBC with Differential/Platelet  Result Value Ref Range   WBC 5.5 3.4 - 10.8 x10E3/uL   RBC 4.32 4.14 - 5.80 x10E6/uL   Hemoglobin 12.5 (L) 13.0 - 17.7 g/dL   Hematocrit 37.6 37.5 - 51.0 %   MCV 87 79 - 97 fL   MCH 28.9 26.6 - 33.0  pg   MCHC 33.2 31.5 - 35.7 g/dL   RDW 13.1 11.6 - 15.4 %   Platelets 206 150 - 450 x10E3/uL   Neutrophils 60 Not Estab. %   Lymphs 24 Not Estab. %   Monocytes 11 Not Estab. %   Eos 4 Not Estab. %   Basos 1 Not Estab. %   Neutrophils Absolute 3.3 1.4 - 7.0 x10E3/uL   Lymphocytes Absolute 1.3 0.7 - 3.1 x10E3/uL   Monocytes Absolute 0.6 0.1 - 0.9 x10E3/uL   EOS (ABSOLUTE) 0.2 0.0 - 0.4 x10E3/uL   Basophils Absolute 0.1 0.0 - 0.2 x10E3/uL   Immature Granulocytes 0 Not Estab. %   Immature Grans (Abs) 0.0 0.0 - 0.1 x10E3/uL  CMP14+EGFR  Result Value Ref Range   Glucose 94 70 - 99 mg/dL   BUN 17 8 - 27 mg/dL   Creatinine, Ser 1.09 0.76 - 1.27 mg/dL   eGFR 69 >59 mL/min/1.73   BUN/Creatinine Ratio 16 10 - 24   Sodium 139 134 - 144 mmol/L   Potassium 4.1 3.5 - 5.2 mmol/L   Chloride 101 96 - 106 mmol/L   CO2 23 20 - 29 mmol/L   Calcium 9.3 8.6 - 10.2 mg/dL   Total Protein 6.2 6.0 - 8.5 g/dL   Albumin 4.3 3.8 - 4.8 g/dL   Globulin, Total 1.9 1.5 - 4.5 g/dL   Albumin/Globulin Ratio 2.3 (H) 1.2 - 2.2   Bilirubin Total 0.5 0.0 - 1.2 mg/dL   Alkaline Phosphatase 65 44 - 121 IU/L   AST 25 0 - 40 IU/L   ALT 17 0 - 44 IU/L  Lipid panel  Result Value Ref Range   Cholesterol, Total 171 100 - 199 mg/dL   Triglycerides 80 0 - 149 mg/dL   HDL 54 >39 mg/dL   VLDL Cholesterol Cal 15 5 - 40 mg/dL   LDL Chol Calc (NIH) 102 (H) 0 - 99 mg/dL   Chol/HDL Ratio 3.2 0.0 - 5.0 ratio  TSH  Result Value Ref Range   TSH 5.070 (H) 0.450 - 4.500 uIU/mL  PSA, total and free  Result Value Ref Range   Prostate Specific Ag, Serum 0.5 0.0 - 4.0 ng/mL   PSA, Free 0.17 N/A ng/mL   PSA, Free Pct 34.0 %    Assessment &  Plan:   Problem List Items Addressed This Visit       Endocrine   Hypothyroidism   Relevant Medications   levothyroxine (SYNTHROID) 88 MCG tablet     Genitourinary   BPH (benign prostatic hyperplasia)   Relevant Medications   tamsulosin (FLOMAX) 0.4 MG CAPS capsule     Other   Hyperlipidemia   Relevant Medications   atorvastatin (LIPITOR) 20 MG tablet   Other Visit Diagnoses     Physical exam    -  Primary     Patient having nocturia, will start Flomax and see how it does, PSA looks to be good.   Continue other medicine no change. Follow up plan: Return in about 1 year (around 06/25/2023), or if symptoms worsen or fail to improve, for Hypothyroidism and cholesterol recheck.  Counseling provided for all of the vaccine components No orders of the defined types were placed in this encounter.   Caryl Pina, MD [facility] 06/24/2022, 4:35 PM

## 2022-06-24 NOTE — Telephone Encounter (Signed)
FOLIVANE-F 125-1 MG ORAL CAPSULE Is Temporarily unavailable to order and there is no future date on when it will be available again.   Would you like to change this to something else?   WM -[place]

## 2022-06-25 ENCOUNTER — Ambulatory Visit: Payer: Medicare Other | Admitting: Family Medicine

## 2022-06-25 ENCOUNTER — Telehealth: Payer: Self-pay | Admitting: Family Medicine

## 2022-06-26 ENCOUNTER — Ambulatory Visit: Payer: Medicare Other | Admitting: Family Medicine

## 2022-06-26 NOTE — Telephone Encounter (Signed)
Patient's wife informed

## 2022-06-26 NOTE — Telephone Encounter (Signed)
There is a rare cross sensitivity with sulfa but it is rare.  Most people that have sulfa antibiotic allergies can take tamsulosin, I would recommend to try 1 pill during the daytime and have some Benadryl on hand and let us know if he has any reaction

## 2022-06-27 ENCOUNTER — Ambulatory Visit: Payer: Self-pay

## 2022-06-27 ENCOUNTER — Ambulatory Visit: Payer: Medicare Other | Admitting: Family Medicine

## 2022-07-15 DIAGNOSIS — M7022 Olecranon bursitis, left elbow: Secondary | ICD-10-CM | POA: Diagnosis not present

## 2022-08-01 ENCOUNTER — Telehealth: Payer: Self-pay | Admitting: Family Medicine

## 2022-08-01 DIAGNOSIS — H9313 Tinnitus, bilateral: Secondary | ICD-10-CM

## 2022-08-01 NOTE — Telephone Encounter (Signed)
I would recommend audiology first

## 2022-08-01 NOTE — Telephone Encounter (Signed)
Pt aware of recomendATIONS

## 2022-08-06 NOTE — Telephone Encounter (Signed)
REFERRAL REQUEST Telephone Note  Have you been seen at our office for this problem? [facility] (Advise that they may need an appointment with their PCP before a referral can be done)  Reason for Referral: ringing in ears Referral discussed with patient: yes  Best contact number of patient for referral team: [phone]    Has patient been seen by a specialist for this issue before: No  Patient provider preference for referral: [phone] Patient location preference for referral: [phone]   Patient notified that referrals can take up to a week or longer to process. If they haven't heard anything within a week they should call back and speak with the referral department.

## 2022-08-07 NOTE — Addendum Note (Signed)
Addended by: Caryl Pina on: 08/07/2022 01:18 PM   Modules accepted: Orders

## 2022-08-07 NOTE — Telephone Encounter (Signed)
Placed referral for the patient. 

## 2022-08-07 NOTE — Telephone Encounter (Signed)
Called lm to notify that referral was put in and he will get a call to set up appt - advised tp call with questions

## 2022-08-27 DIAGNOSIS — H6122 Impacted cerumen, left ear: Secondary | ICD-10-CM | POA: Diagnosis not present

## 2022-08-27 DIAGNOSIS — H903 Sensorineural hearing loss, bilateral: Secondary | ICD-10-CM | POA: Diagnosis not present

## 2022-08-27 DIAGNOSIS — J310 Chronic rhinitis: Secondary | ICD-10-CM | POA: Insufficient documentation

## 2022-08-27 DIAGNOSIS — H9313 Tinnitus, bilateral: Secondary | ICD-10-CM | POA: Diagnosis not present

## 2022-09-03 DIAGNOSIS — M25522 Pain in left elbow: Secondary | ICD-10-CM | POA: Diagnosis not present

## 2022-09-05 DIAGNOSIS — H401213 Low-tension glaucoma, right eye, severe stage: Secondary | ICD-10-CM | POA: Diagnosis not present

## 2022-09-05 DIAGNOSIS — H401221 Low-tension glaucoma, left eye, mild stage: Secondary | ICD-10-CM | POA: Diagnosis not present

## 2022-09-05 DIAGNOSIS — H25813 Combined forms of age-related cataract, bilateral: Secondary | ICD-10-CM | POA: Diagnosis not present

## 2022-09-23 ENCOUNTER — Other Ambulatory Visit: Payer: Self-pay | Admitting: *Deleted

## 2022-09-23 DIAGNOSIS — E039 Hypothyroidism, unspecified: Secondary | ICD-10-CM

## 2022-09-23 DIAGNOSIS — E7800 Pure hypercholesterolemia, unspecified: Secondary | ICD-10-CM

## 2022-09-23 MED ORDER — LEVOTHYROXINE SODIUM 88 MCG PO TABS: 88.0000 ug | ORAL_TABLET | Freq: Every day | ORAL | 2 refills | Status: DC

## 2022-09-23 MED ORDER — ATORVASTATIN CALCIUM 20 MG PO TABS: 20.0000 mg | ORAL_TABLET | Freq: Every day | ORAL | 2 refills | Status: DC

## 2022-11-22 ENCOUNTER — Telehealth: Payer: Self-pay

## 2022-11-22 NOTE — Telephone Encounter (Signed)
Erroneous encounter

## 2022-11-28 ENCOUNTER — Encounter: Payer: Self-pay | Admitting: Family Medicine

## 2022-11-28 ENCOUNTER — Ambulatory Visit (INDEPENDENT_AMBULATORY_CARE_PROVIDER_SITE_OTHER): Payer: PPO | Admitting: Family Medicine

## 2022-11-28 VITALS — BP 129/74 | HR 63 | Temp 97.2°F | Ht 69.0 in | Wt 182.0 lb

## 2022-11-28 DIAGNOSIS — I8393 Asymptomatic varicose veins of bilateral lower extremities: Secondary | ICD-10-CM | POA: Diagnosis not present

## 2022-11-28 NOTE — Progress Notes (Signed)
BP 129/74   Pulse 63   Temp (!) 97.2 F (36.2 C)   Ht '5\' 9"'$  (1.753 m)   Wt 182 lb (82.6 kg)   SpO2 99%   BMI 26.88 kg/m    Subjective:   Patient ID: Anthony Noble., male    DOB: [birth year], 81 y.o.   MRN: [id number]  HPI: Anthony Noble. is a 81 y.o. male presenting on 11/28/2022 for Place on ankle (Right, present for months. Itching only. Denies pain, swelling, reddness)   HPI Patient is coming in today with complaints of a spot on his ankle that is hard and he does want to get a double check.  He has no overlying skin changes that he has noted but is just a little hard nodule on his inner ankle on the right leg.  He says it has been there for months but just is coming in now to get it looked at.  Relevant past medical, surgical, family and social history reviewed and updated as indicated. Interim medical history since our last visit reviewed. Allergies and medications reviewed and updated.  Review of Systems  Constitutional:  Negative for chills and fever.  Respiratory:  Negative for shortness of breath and wheezing.   Cardiovascular:  Negative for chest pain and leg swelling.  Musculoskeletal:  Negative for back pain and gait problem.  Skin:  Negative for color change, rash and wound.  All other systems reviewed and are negative.   Per HPI unless specifically indicated above   Allergies as of 11/28/2022       Reactions   Bcg Live Other (See Comments)   Treatment pt had- shakes   Septra [bactrim] Other (See Comments)   Muscle weakness, numbness   Sulfa Antibiotics Other (See Comments)   Severe weakness   Sulfamethoxazole-trimethoprim Other (See Comments)   Muscle weakness, numbness Muscle weakness, numbness   Pertussis Vaccines    History of Gillian barrie        Medication List        Accurate as of November 28, 2022 10:18 AM. If you have any questions, ask your nurse or doctor.          STOP taking these medications    Folivane-F  125-1 MG Caps Stopped by: Fransisca Kaufmann Finnis Colee, MD   tamsulosin 0.4 MG Caps capsule Commonly known as: FLOMAX Stopped by: Fransisca Kaufmann Camilo Mander, MD       TAKE these medications    atorvastatin 20 MG tablet Commonly known as: LIPITOR Take 1 tablet (20 mg total) by mouth daily.   BENADRYL ALLERGY PO Take by mouth as needed.   BIOTIN 5000 PO Take 5,000 mg by mouth daily.   brimonidine 0.2 % ophthalmic solution Commonly known as: ALPHAGAN Place into the right eye 2 (two) times daily.   cholecalciferol 1000 units tablet Commonly known as: VITAMIN D Take 5,000 Units by mouth daily.   CoQ10 200 MG Caps Take 1 capsule by mouth daily.   CRANBERRY PO Take 4,200 mg by mouth daily.   dorzolamide-timolol 2-0.5 % ophthalmic solution Commonly known as: COSOPT Place 1 drop into both eyes 2 (two) times daily.   ferrous gluconate 240 (27 FE) MG tablet Commonly known as: FERGON Take 1 tablet (240 mg total) by mouth daily.   fexofenadine 180 MG tablet Commonly known as: ALLEGRA Take 180 mg by mouth daily.   Fish Oil 1200 MG Caps Take by mouth.   folic acid 734 MCG tablet Commonly known  as: V-R FOLIC ACID Take 1 tablet (400 mcg total) by mouth daily.   HAIR SKIN NAILS PO Take 1 tablet by mouth daily.   hyoscyamine 0.125 MG SL tablet Commonly known as: LEVSIN SL DISSOLVE 1 TABLET IN MOUTH EVERY 8 HOURS AS NEEDED   KRILL OIL PO Take 1 Capful by mouth daily.   latanoprost 0.005 % ophthalmic solution Commonly known as: XALATAN Place 1 drop into both eyes at bedtime.   levothyroxine 88 MCG tablet Commonly known as: SYNTHROID Take 1 tablet (88 mcg total) by mouth daily before breakfast.   Magnesium 500 MG Caps Take 1 capsule by mouth daily.   multivitamin with minerals Tabs tablet Take 1 tablet by mouth daily.         Objective:   BP 129/74   Pulse 63   Temp (!) 97.2 F (36.2 C)   Ht '5\' 9"'$  (1.753 m)   Wt 182 lb (82.6 kg)   SpO2 99%   BMI 26.88 kg/m   Wt  Readings from Last 3 Encounters:  11/28/22 182 lb (82.6 kg)  06/24/22 179 lb (81.2 kg)  01/31/22 183 lb 9.6 oz (83.3 kg)    Physical Exam Vitals and nursing note reviewed.  Constitutional:      Appearance: Normal appearance.  Skin:      Neurological:     Mental Status: He is alert.       Assessment & Plan:   Problem List Items Addressed This Visit   None Visit Diagnoses     Asymptomatic varicose veins of both lower extremities    -  Primary     The lesion that he is concerned about is actually hard and varicose vein.  No concern, monitor.  If he gets pain or inflammation surrounding it that he contact us.  Follow up plan: Return if symptoms worsen or fail to improve.  Counseling provided for all of the vaccine components No orders of the defined types were placed in this encounter.   Caryl Pina, MD [facility] 11/28/2022, 10:18 AM

## 2022-12-19 DIAGNOSIS — H401213 Low-tension glaucoma, right eye, severe stage: Secondary | ICD-10-CM | POA: Diagnosis not present

## 2022-12-19 DIAGNOSIS — H401221 Low-tension glaucoma, left eye, mild stage: Secondary | ICD-10-CM | POA: Diagnosis not present

## 2023-01-02 DIAGNOSIS — H401213 Low-tension glaucoma, right eye, severe stage: Secondary | ICD-10-CM | POA: Diagnosis not present

## 2023-01-02 DIAGNOSIS — H401221 Low-tension glaucoma, left eye, mild stage: Secondary | ICD-10-CM | POA: Diagnosis not present

## 2023-01-06 DIAGNOSIS — H10013 Acute follicular conjunctivitis, bilateral: Secondary | ICD-10-CM | POA: Diagnosis not present

## 2023-01-23 NOTE — Progress Notes (Shared)
[facility] Note  02/06/2023     CHIEF COMPLAINT Patient presents for No chief complaint on file.    HISTORY OF PRESENT ILLNESS: Anthony Noble. is a 82 y.o. male who presents to the clinic today for:    Pt states he had an appt with Dr. Ander Slade and his IOP was up 4 points, so Dr. Ander Slade increased his brimonidine to TID, he states last night, he realized he had been using an allergy eye drop instead of Rocklatan, pt states about 3 weeks ago he had an infection in both eye, he saw Dr. Marin Comment and was given an ointment to use   Referring physician: Dettinger, Fransisca Kaufmann, MD [place],   [id number]  HISTORICAL INFORMATION:   Selected notes from the [place] Referred by [facility] for eval of edema   CURRENT MEDICATIONS: Current Outpatient Medications (Ophthalmic Drugs)  Medication Sig   brimonidine (ALPHAGAN) 0.2 % ophthalmic solution Place into the right eye 2 (two) times daily.   dorzolamide-timolol (COSOPT) 22.3-6.8 MG/ML ophthalmic solution Place 1 drop into both eyes 2 (two) times daily.   latanoprost (XALATAN) 0.005 % ophthalmic solution Place 1 drop into both eyes at bedtime.   No current facility-administered medications for this visit. (Ophthalmic Drugs)   Current Outpatient Medications (Other)  Medication Sig   atorvastatin (LIPITOR) 20 MG tablet Take 1 tablet (20 mg total) by mouth daily.   BIOTIN 5000 PO Take 5,000 mg by mouth daily.   cholecalciferol (VITAMIN D) 1000 units tablet Take 5,000 Units by mouth daily.    Coenzyme Q10 (COQ10) 200 MG CAPS Take 1 capsule by mouth daily.   CRANBERRY PO Take 4,200 mg by mouth daily.   diphenhydrAMINE HCl (BENADRYL ALLERGY PO) Take by mouth as needed.   ferrous gluconate (FERGON) 240 (27 FE) MG tablet Take 1 tablet (240 mg total) by mouth daily.   fexofenadine (ALLEGRA) 180 MG tablet Take 180 mg by mouth daily.   folic acid (V-R FOLIC ACID) A999333 MCG tablet Take 1 tablet (400  mcg total) by mouth daily.   hyoscyamine (LEVSIN SL) 0.125 MG SL tablet DISSOLVE 1 TABLET IN MOUTH EVERY 8 HOURS AS NEEDED   KRILL OIL PO Take 1 Capful by mouth daily.   levothyroxine (SYNTHROID) 88 MCG tablet Take 1 tablet (88 mcg total) by mouth daily before breakfast.   Magnesium 500 MG CAPS Take 1 capsule by mouth daily.   Multiple Vitamin (MULTIVITAMIN WITH MINERALS) TABS tablet Take 1 tablet by mouth daily.   Multiple Vitamins-Minerals (HAIR SKIN NAILS PO) Take 1 tablet by mouth daily.   Omega-3 Fatty Acids (FISH OIL) 1200 MG CAPS Take by mouth.   No current facility-administered medications for this visit. (Other)   REVIEW OF SYSTEMS:   ALLERGIES Allergies  Allergen Reactions   Bcg Live Other (See Comments)    Treatment pt had- shakes   Septra [Bactrim] Other (See Comments)    Muscle weakness, numbness   Sulfa Antibiotics Other (See Comments)    Severe weakness   Sulfamethoxazole-Trimethoprim Other (See Comments)    Muscle weakness, numbness Muscle weakness, numbness   Pertussis Vaccines     History of Gillian barrie    PAST MEDICAL HISTORY Past Medical History:  Diagnosis Date   Bilateral carotid artery stenosis without cerebral infarction    Asymptomatic---bilateral ICA <40%  and mild stenosis bilateral ECA   BPH (benign prostatic hypertrophy) with urinary obstruction  Cancer [facility]) 2004   bladder   Diverticulosis of colon    Glaucoma    Guillain Barr syndrome [facility]) 2001   Heart murmur March /  April 2016   History of bladder cancer    first dx 2004--  tx with chemo bladder instillation   History of colon polyps    History of pleural effusion    2001   History of vertebral fracture    2001--   T12   Hyperlipidemia    Hypothyroidism    Pneumonia 2001   Varicose veins    Past Surgical History:  Procedure Laterality Date   CYSTO/  BILATERAL RETROGRADE PYLOGRAM/  RESECTION BLADDER TUMOR  07-30-2011   CYSTOSCOPY WITH BIOPSY N/A 01/12/2015   Procedure:  CYSTOSCOPY WITH BLADDER  BIOPSY, FULGERATION;  Surgeon: Malka So, MD;  Location[facility];  Service: Urology;  Laterality: N/A;   NEGATIVE SLEEP STUDY  YRS AGO per pt   ORIF RIGHT DISTAL RADIUS FX  12-31-2007   TONSILLECTOMY  as child   TRANSURETHRAL RESECTION OF BLADDER TUMOR WITH MITOMYCIN-C  06-07-2003   VIDEO ASSISTED THORACOSCOPY (VATS)/EMPYEMA Right 05-01-2000   and Drainage of effusion   FAMILY HISTORY Family History  Problem Relation Age of Onset   Other Mother        varicose veins   Cancer Mother        Ovarian   Varicose Veins Mother    Nephrolithiasis Mother    COPD Father    Hyperlipidemia Father    Cancer Maternal Grandmother    Bipolar disorder Daughter    Healthy Daughter    Hypertension Sister    Healthy Daughter    Colon cancer Neg Hx    SOCIAL HISTORY Social History   Tobacco Use   Smoking status: Former    Packs/day: 1.00    Years: 20.00    Total pack years: 20.00    Types: Cigarettes    Quit date: 12/09/1981    Years since quitting: 41.1    Passive exposure: Never   Smokeless tobacco: Never  Vaping Use   Vaping Use: Never used  Substance Use Topics   Alcohol use: Yes    Alcohol/week: 2.0 standard drinks of alcohol    Types: 1 Shots of liquor, 1 Standard drinks or equivalent per week   Drug use: No       OPHTHALMIC EXAM:  Not recorded    IMAGING AND PROCEDURES  Imaging and Procedures for 02/06/2023          ASSESSMENT/PLAN:    ICD-10-CM   1. 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 Essential hypertension  I10     3. Hypertensive retinopathy of both eyes  H35.033     4. Combined forms of age-related cataract of both eyes  H25.813     5. Low-tension glaucoma of both eyes, unspecified glaucoma stage  H40.1230      1. Retinoschisis OS -- improved  - focal peripapillary patch of multilaminar retinoschisis superior to disc  - OCT shows multilaminar schisis above disc improved, now just  trace schisis present  - FA 3.17.22 -- no leakage or CNV  - discussed findings, prognosis  - pt reports schisis presented shortly after severe fall on face/chin  - no retinal / ocular intervention indicated or recommended  - monitor  - f/u in 9-12 months -- DFE/OCT  2,3. Hypertensive retinopathy OU - discussed importance of tight BP control -  monitor  4. Mixed Cataract OU - The symptoms of cataract, surgical options, and treatments and risks were discussed with patient. - discussed diagnosis and progression - not yet visually significant - monitor  5. Low tension glaucoma OU  - under the expert management of Dr. Ander Slade  - IOP today: 14,16  - on Cosopt BID OU, Rocklatan QHS OU, Brimonidine inc TID OD  Ophthalmic Meds Ordered this visit:  No orders of the defined types were placed in this encounter.    No follow-ups on file.  There are no Patient Instructions on file for this visit.   Explained the diagnoses, plan, and follow up with the patient and they expressed understanding.  Patient expressed understanding of the importance of proper follow up care.   This document serves as a record of services personally performed by Gardiner Sleeper, MD, PhD. It was created on their behalf by San Jetty. Owens Shark, OA an ophthalmic technician. The creation of this record is the provider's dictation and/or activities during the visit.    Electronically signed by: San Jetty. Marguerita Merles 02.15.2024 12:28 PM   Gardiner Sleeper, M.D., Ph.D. Diseases & Surgery of the Retina and Vitreous [facility]    Abbreviations: M myopia (nearsighted); A astigmatism; H hyperopia (farsighted); P presbyopia; Mrx spectacle prescription;  CTL contact lenses; OD right eye; OS left eye; OU both eyes  XT exotropia; ET esotropia; PEK punctate epithelial keratitis; PEE punctate epithelial erosions; DES dry eye syndrome; MGD meibomian gland dysfunction; ATs artificial tears; PFAT's preservative free  artificial tears; [place] nuclear sclerotic cataract; PSC posterior subcapsular cataract; ERM epi-retinal membrane; PVD posterior vitreous detachment; RD retinal detachment; DM diabetes mellitus; DR diabetic retinopathy; NPDR non-proliferative diabetic retinopathy; PDR proliferative diabetic retinopathy; CSME clinically significant macular edema; DME diabetic macular edema; dbh dot blot hemorrhages; CWS cotton wool spot; POAG primary open angle glaucoma; C/D cup-to-disc ratio; HVF humphrey visual field; GVF goldmann visual field; OCT optical coherence tomography; IOP intraocular pressure; BRVO Branch retinal vein occlusion; CRVO central retinal vein occlusion; CRAO central retinal artery occlusion; BRAO branch retinal artery occlusion; RT retinal tear; SB scleral buckle; PPV pars plana vitrectomy; VH Vitreous hemorrhage; PRP panretinal laser photocoagulation; IVK intravitreal kenalog; VMT vitreomacular traction; MH Macular hole;  NVD neovascularization of the disc; NVE neovascularization elsewhere; AREDS age related eye disease study; ARMD age related macular degeneration; POAG primary open angle glaucoma; EBMD epithelial/anterior basement membrane dystrophy; ACIOL anterior chamber intraocular lens; IOL intraocular lens; PCIOL posterior chamber intraocular lens; Phaco/IOL phacoemulsification with intraocular lens placement; [place] photorefractive keratectomy; LASIK laser assisted in situ keratomileusis; HTN hypertension; DM diabetes mellitus; COPD chronic obstructive pulmonary disease

## 2023-01-24 ENCOUNTER — Other Ambulatory Visit: Payer: Self-pay | Admitting: *Deleted

## 2023-01-24 DIAGNOSIS — I6523 Occlusion and stenosis of bilateral carotid arteries: Secondary | ICD-10-CM

## 2023-01-30 DIAGNOSIS — L821 Other seborrheic keratosis: Secondary | ICD-10-CM | POA: Diagnosis not present

## 2023-01-30 DIAGNOSIS — L308 Other specified dermatitis: Secondary | ICD-10-CM | POA: Diagnosis not present

## 2023-01-30 DIAGNOSIS — L570 Actinic keratosis: Secondary | ICD-10-CM | POA: Diagnosis not present

## 2023-01-30 DIAGNOSIS — L853 Xerosis cutis: Secondary | ICD-10-CM | POA: Diagnosis not present

## 2023-02-06 ENCOUNTER — Ambulatory Visit: Payer: Self-pay

## 2023-02-06 ENCOUNTER — Encounter (HOSPITAL_COMMUNITY): Payer: Self-pay

## 2023-02-06 ENCOUNTER — Encounter (INDEPENDENT_AMBULATORY_CARE_PROVIDER_SITE_OTHER): Payer: PPO | Admitting: Ophthalmology

## 2023-02-06 DIAGNOSIS — H25813 Combined forms of age-related cataract, bilateral: Secondary | ICD-10-CM

## 2023-02-06 DIAGNOSIS — I10 Essential (primary) hypertension: Secondary | ICD-10-CM

## 2023-02-06 DIAGNOSIS — H401230 Low-tension glaucoma, bilateral, stage unspecified: Secondary | ICD-10-CM

## 2023-02-06 DIAGNOSIS — H33192 Other retinoschisis and retinal cysts, left eye: Secondary | ICD-10-CM

## 2023-02-06 DIAGNOSIS — H35033 Hypertensive retinopathy, bilateral: Secondary | ICD-10-CM

## 2023-02-11 ENCOUNTER — Ambulatory Visit (HOSPITAL_COMMUNITY)
Admission: RE | Admit: 2023-02-11 | Discharge: 2023-02-11 | Disposition: A | Discharge status: Home / Self Care | Payer: PPO | Source: Ambulatory Visit | Attending: Vascular Surgery | Admitting: Vascular Surgery

## 2023-02-11 ENCOUNTER — Ambulatory Visit (INDEPENDENT_AMBULATORY_CARE_PROVIDER_SITE_OTHER): Payer: PPO | Admitting: Physician Assistant

## 2023-02-11 VITALS — BP 128/71 | HR 61 | Temp 98.3°F | Resp 20 | Ht 69.0 in | Wt 182.8 lb

## 2023-02-11 DIAGNOSIS — I6523 Occlusion and stenosis of bilateral carotid arteries: Secondary | ICD-10-CM | POA: Diagnosis not present

## 2023-02-11 NOTE — Progress Notes (Unsigned)
Office Note     CC:  follow up Requesting Provider:  Dettinger, Elige Radon, MD  HPI: Anthony Noble. is a 82 y.o. (July 22, 1941) male who presents for evaluation of asymptomatic carotid artery stenosis.  Since last office visit in February 2023, he has not been diagnosed with CVA or TIA.  He also has not had any strokelike symptoms including slurring speech, changes in vision, or one-sided weakness.  He is on a daily statin.  He remains fairly active either using the elliptical machine or golfing on a daily basis.  He takes an aspirin every other day.  He is a former smoker.   Past Medical History:  Diagnosis Date   Bilateral carotid artery stenosis without cerebral infarction    Asymptomatic---bilateral ICA <40%  and mild stenosis bilateral ECA   BPH (benign prostatic hypertrophy) with urinary obstruction    Cancer (HCC) 2004   bladder   Diverticulosis of colon    Glaucoma    Guillain Barr syndrome (HCC) 2001   Heart murmur March /  April 2016   History of bladder cancer    first dx 2004--  tx with chemo bladder instillation   History of colon polyps    History of pleural effusion    2001   History of vertebral fracture    2001--   T12   Hyperlipidemia    Hypothyroidism    Pneumonia 2001   Varicose veins     Past Surgical History:  Procedure Laterality Date   CYSTO/  BILATERAL RETROGRADE PYLOGRAM/  RESECTION BLADDER TUMOR  07-30-2011   CYSTOSCOPY WITH BIOPSY N/A 01/12/2015   Procedure: CYSTOSCOPY WITH BLADDER  BIOPSY, FULGERATION;  Surgeon: Anner Crete, MD;  [facility];  Service: Urology;  Laterality: N/A;   NEGATIVE SLEEP STUDY  YRS AGO per pt   ORIF RIGHT DISTAL RADIUS FX  12-31-2007   TONSILLECTOMY  as child   TRANSURETHRAL RESECTION OF BLADDER TUMOR WITH MITOMYCIN-C  06-07-2003   VIDEO ASSISTED THORACOSCOPY (VATS)/EMPYEMA Right 05-01-2000   and Drainage of effusion    Social History   Socioeconomic History   Marital status: Married     Spouse name: Not on file   Number of children: 2   Years of education: Not on file   Highest education level: Not on file  Occupational History   Occupation: retired    Comment: self employed-printing  Tobacco Use   Smoking status: Former    Packs/day: 1.00    Years: 20.00    Total pack years: 20.00    Types: Cigarettes    Quit date: 12/09/1981    Years since quitting: 41.2    Passive exposure: Never   Smokeless tobacco: Never  Vaping Use   Vaping Use: Never used  Substance and Sexual Activity   Alcohol use: Yes    Alcohol/week: 2.0 standard drinks of alcohol    Types: 1 Shots of liquor, 1 Standard drinks or equivalent per week   Drug use: No   Sexual activity: Not on file  Other Topics Concern   Not on file  Social History Narrative   Lives at home with wife and daughter.      Social Determinants of Health   Financial Resource Strain: Low Risk  (05/02/2020)   Overall Financial Resource Strain (CARDIA)    Difficulty of Paying Living Expenses: Not hard at all  Food Insecurity: No Food Insecurity (05/02/2020)   Hunger Vital Sign    Worried About Running Out  of Food in the Last Year: Never true    Ran Out of Food in the Last Year: Never true  Transportation Needs: No Transportation Needs (05/02/2020)   PRAPARE - Administrator, Civil Service (Medical): No    Lack of Transportation (Non-Medical): No  Physical Activity: Sufficiently Active (05/02/2020)   Exercise Vital Sign    Days of Exercise per Week: 5 days    Minutes of Exercise per Session: 60 min  Stress: No Stress Concern Present (05/02/2020)   [name] of Occupational Health - Occupational Stress Questionnaire    Feeling of Stress : Not at all  Social Connections: Socially Integrated (05/02/2020)   Social Connection and Isolation Panel [NHANES]    Frequency of Communication with Friends and Family: More than three times a week    Frequency of Social Gatherings with Friends and Family: More than  three times a week    Attends Religious Services: More than 4 times per year    Active Member of [employer] or Organizations: Yes    Attends Engineer, structural: More than 4 times per year    Marital Status: Married  Catering manager Violence: Not At Risk (05/02/2020)   Humiliation, Afraid, Rape, and Kick questionnaire    Fear of Current or Ex-Partner: No    Emotionally Abused: No    Physically Abused: No    Sexually Abused: No    Family History  Problem Relation Age of Onset   Other Mother        varicose veins   Cancer Mother        Ovarian   Varicose Veins Mother    Nephrolithiasis Mother    COPD Father    Hyperlipidemia Father    Cancer Maternal Grandmother    Bipolar disorder Daughter    Healthy Daughter    Hypertension Sister    Healthy Daughter    Colon cancer Neg Hx     Current Outpatient Medications  Medication Sig Dispense Refill   atorvastatin (LIPITOR) 20 MG tablet Take 1 tablet (20 mg total) by mouth daily. 90 tablet 2   BIOTIN 5000 PO Take 5,000 mg by mouth daily.     brimonidine (ALPHAGAN) 0.2 % ophthalmic solution Place into the right eye 2 (two) times daily.     cholecalciferol (VITAMIN D) 1000 units tablet Take 5,000 Units by mouth daily.      Coenzyme Q10 (COQ10) 200 MG CAPS Take 1 capsule by mouth daily.     CRANBERRY PO Take 4,200 mg by mouth daily.     diphenhydrAMINE HCl (BENADRYL ALLERGY PO) Take by mouth as needed.     dorzolamide-timolol (COSOPT) 22.3-6.8 MG/ML ophthalmic solution Place 1 drop into both eyes 2 (two) times daily.     ferrous gluconate (FERGON) 240 (27 FE) MG tablet Take 1 tablet (240 mg total) by mouth daily. 90 tablet 3   fexofenadine (ALLEGRA) 180 MG tablet Take 180 mg by mouth daily.     folic acid (V-R FOLIC ACID) 400 MCG tablet Take 1 tablet (400 mcg total) by mouth daily. 90 tablet 3   hyoscyamine (LEVSIN SL) 0.125 MG SL tablet DISSOLVE 1 TABLET IN MOUTH EVERY 8 HOURS AS NEEDED 120 tablet 2   KRILL OIL PO Take 1 Capful  by mouth daily.     latanoprost (XALATAN) 0.005 % ophthalmic solution Place 1 drop into both eyes at bedtime.     levothyroxine (SYNTHROID) 88 MCG tablet Take 1 tablet (88 mcg total) by  mouth daily before breakfast. 90 tablet 2   Magnesium 500 MG CAPS Take 1 capsule by mouth daily.     Multiple Vitamin (MULTIVITAMIN WITH MINERALS) TABS tablet Take 1 tablet by mouth daily.     Multiple Vitamins-Minerals (HAIR SKIN NAILS PO) Take 1 tablet by mouth daily.     Omega-3 Fatty Acids (FISH OIL) 1200 MG CAPS Take by mouth.     No current facility-administered medications for this visit.    Allergies  Allergen Reactions   Bcg Live Other (See Comments)    Treatment pt had- shakes   Septra [Bactrim] Other (See Comments)    Muscle weakness, numbness   Sulfa Antibiotics Other (See Comments)    Severe weakness   Sulfamethoxazole-Trimethoprim Other (See Comments)    Muscle weakness, numbness Muscle weakness, numbness   Pertussis Vaccines     History of Gillian barrie      REVIEW OF SYSTEMS:   [X]  denotes positive finding, [ ]  denotes negative finding Cardiac  Comments:  Chest pain or chest pressure:    Shortness of breath upon exertion:    Short of breath when lying flat:    Irregular heart rhythm:        Vascular    Pain in calf, thigh, or hip brought on by ambulation:    Pain in feet at night that wakes you up from your sleep:     Blood clot in your veins:    Leg swelling:         Pulmonary    Oxygen at home:    Productive cough:     Wheezing:         Neurologic    Sudden weakness in arms or legs:     Sudden numbness in arms or legs:     Sudden onset of difficulty speaking or slurred speech:    Temporary loss of vision in one eye:     Problems with dizziness:         Gastrointestinal    Blood in stool:     Vomited blood:         Genitourinary    Burning when urinating:     Blood in urine:        Psychiatric    Major depression:         Hematologic    Bleeding  problems:    Problems with blood clotting too easily:        Skin    Rashes or ulcers:        Constitutional    Fever or chills:      PHYSICAL EXAMINATION:  Vitals:   02/11/23 1501 02/11/23 1503  BP: 138/65 128/71  Pulse: 61   Resp: 20   Temp: 98.3 F (36.8 C)   TempSrc: Temporal   SpO2: 98%   Weight: 182 lb 12.8 oz (82.9 kg)   Height: 5\' 9"  (1.753 m)     General:  WDWN in NAD; vital signs documented above Gait: Not observed HENT: WNL, normocephalic Pulmonary: normal non-labored breathing , without Rales, rhonchi,  wheezing Cardiac: regular HR Abdomen: soft, NT, no masses Skin: without rashes Vascular Exam/Pulses:  Right Left  Radial 2+ (normal) 2+ (normal)   Extremities: without ischemic changes, without Gangrene , without cellulitis; without open wounds;  Musculoskeletal: no muscle wasting or atrophy  Neurologic: A&O X 3;  CN grossly intact Psychiatric:  The pt has Normal affect.   Non-Invasive Vascular Imaging:   Right ICA 40 to 59% Left  ICA 1 to 39%    ASSESSMENT/PLAN:: 82 y.o. male here for follow up for surveillance of carotid artery stenosis  -Subjectively has not had any neurological events since last office visit -Carotid duplex demonstrates right ICA stenosis of 40 to 59% which is up from last office visit of 1 to 39%; left ICA stenosis is stable at 1 to 39% -Given the increase in stenosis of the right ICA, we will recheck carotid duplex in 6 months.  If stable at that time we can return to annual follow-up.  He will continue his aspirin and statin.  He knows to call/return office sooner with any questions or concerns.   Emilie Rutter, PA-C [facility] [phone]  Clinic MD:   Chest[facility] Spore

## 2023-02-13 ENCOUNTER — Encounter: Payer: Self-pay | Admitting: Physician Assistant

## 2023-02-14 ENCOUNTER — Other Ambulatory Visit: Payer: Self-pay

## 2023-02-14 DIAGNOSIS — I6523 Occlusion and stenosis of bilateral carotid arteries: Secondary | ICD-10-CM

## 2023-03-20 ENCOUNTER — Other Ambulatory Visit: Payer: Self-pay | Admitting: Family Medicine

## 2023-04-08 NOTE — Progress Notes (Signed)
[facility] - Clinic Note  04/22/2023     CHIEF COMPLAINT Patient presents for Retina Follow Up    HISTORY OF PRESENT ILLNESS: Anthony Noble. is a 82 y.o. male who presents to the clinic today for:  HPI     Retina Follow Up   Patient presents with  Other.  In left eye.  This started 1 year ago.  I, the attending physician,  performed the HPI with the patient and updated documentation appropriately.        Comments   Patient here for 1 year retina follow up for retinoschisis OS. Patient states vision ok. No eye pain. Uses drops. Taking CBD gummies for ringing in ears.       Last edited by Rennis Chris, MD on 04/23/2023  5:12 PM.    Pt is still following with Dr. Loraine Grip for her IOP  Referring physician: Candise Che, MD [address] [place],  [place] [address]  HISTORICAL INFORMATION:   Selected notes from the MEDICAL RECORD NUMBER Referred by [facility] for eval of edema   CURRENT MEDICATIONS: Current Outpatient Medications (Ophthalmic Drugs)  Medication Sig   brimonidine (ALPHAGAN) 0.2 % ophthalmic solution Place into the right eye 2 (two) times daily.   dorzolamide-timolol (COSOPT) 22.3-6.8 MG/ML ophthalmic solution Place 1 drop into both eyes 2 (two) times daily.   latanoprost (XALATAN) 0.005 % ophthalmic solution Place 1 drop into both eyes at bedtime.   No current facility-administered medications for this visit. (Ophthalmic Drugs)   Current Outpatient Medications (Other)  Medication Sig   atorvastatin (LIPITOR) 20 MG tablet Take 1 tablet (20 mg total) by mouth daily.   BIOTIN 5000 PO Take 5,000 mg by mouth daily.   cholecalciferol (VITAMIN D) 1000 units tablet Take 5,000 Units by mouth daily.    Coenzyme Q10 (COQ10) 200 MG CAPS Take 1 capsule by mouth daily.   CRANBERRY PO Take 4,200 mg by mouth daily.   diphenhydrAMINE HCl (BENADRYL ALLERGY PO) Take by mouth as needed.   ferrous gluconate (FERGON)  240 (27 FE) MG tablet Take 1 tablet (240 mg total) by mouth daily.   fexofenadine (ALLEGRA) 180 MG tablet Take 180 mg by mouth daily.   folic acid (V-R FOLIC ACID) 400 MCG tablet Take 1 tablet (400 mcg total) by mouth daily.   hyoscyamine (LEVSIN SL) 0.125 MG SL tablet DISSOLVE 1 TABLET IN MOUTH EVERY 8 HOURS AS NEEDED   KRILL OIL PO Take 1 Capful by mouth daily.   levothyroxine (SYNTHROID) 88 MCG tablet Take 1 tablet (88 mcg total) by mouth daily before breakfast.   Magnesium 500 MG CAPS Take 1 capsule by mouth daily.   Multiple Vitamin (MULTIVITAMIN WITH MINERALS) TABS tablet Take 1 tablet by mouth daily.   Multiple Vitamins-Minerals (HAIR SKIN NAILS PO) Take 1 tablet by mouth daily.   Omega-3 Fatty Acids (FISH OIL) 1200 MG CAPS Take by mouth.   No current facility-administered medications for this visit. (Other)   REVIEW OF SYSTEMS: ROS   Positive for: Eyes Negative for: Constitutional, Gastrointestinal, Neurological, Skin, Genitourinary, Musculoskeletal, HENT, Endocrine, Cardiovascular, Respiratory, Psychiatric, Allergic/Imm, Heme/Lymph Last edited by Laddie Aquas, COA on 04/22/2023  8:00 AM.      ALLERGIES Allergies  Allergen Reactions   Bcg Live Other (See Comments)    Treatment pt had- shakes   Septra [Bactrim] Other (See Comments)    Muscle weakness, numbness   Sulfa Antibiotics Other (See Comments)  Severe weakness   Sulfamethoxazole-Trimethoprim Other (See Comments)    Muscle weakness, numbness Muscle weakness, numbness   Pertussis Vaccines     History of Gillian barrie    PAST MEDICAL HISTORY Past Medical History:  Diagnosis Date   Bilateral carotid artery stenosis without cerebral infarction    Asymptomatic---bilateral ICA <40%  and mild stenosis bilateral ECA   BPH (benign prostatic hypertrophy) with urinary obstruction    Cancer (HCC) 2004   bladder   Diverticulosis of colon    Glaucoma    Guillain Barr syndrome (HCC) 2001   Heart murmur March /   April 2016   History of bladder cancer    first dx 2004--  tx with chemo bladder instillation   History of colon polyps    History of pleural effusion    2001   History of vertebral fracture    2001--   T12   Hyperlipidemia    Hypothyroidism    Pneumonia 2001   Varicose veins    Past Surgical History:  Procedure Laterality Date   CYSTO/  BILATERAL RETROGRADE PYLOGRAM/  RESECTION BLADDER TUMOR  07-30-2011   CYSTOSCOPY WITH BIOPSY N/A 01/12/2015   Procedure: CYSTOSCOPY WITH BLADDER  BIOPSY, FULGERATION;  Surgeon: Anner Crete, MD;  Location: [facility];  Service: Urology;  Laterality: N/A;   NEGATIVE SLEEP STUDY  YRS AGO per pt   ORIF RIGHT DISTAL RADIUS FX  12-31-2007   TONSILLECTOMY  as child   TRANSURETHRAL RESECTION OF BLADDER TUMOR WITH MITOMYCIN-C  06-07-2003   VIDEO ASSISTED THORACOSCOPY (VATS)/EMPYEMA Right 05-01-2000   and Drainage of effusion   FAMILY HISTORY Family History  Problem Relation Age of Onset   Other Mother        varicose veins   Cancer Mother        Ovarian   Varicose Veins Mother    Nephrolithiasis Mother    COPD Father    Hyperlipidemia Father    Cancer Maternal Grandmother    Bipolar disorder Daughter    Healthy Daughter    Hypertension Sister    Healthy Daughter    Colon cancer Neg Hx    SOCIAL HISTORY Social History   Tobacco Use   Smoking status: Former    Packs/day: 1.00    Years: 20.00    Additional pack years: 0.00    Total pack years: 20.00    Types: Cigarettes    Quit date: 12/09/1981    Years since quitting: 41.3    Passive exposure: Never   Smokeless tobacco: Never  Vaping Use   Vaping Use: Never used  Substance Use Topics   Alcohol use: Yes    Alcohol/week: 2.0 standard drinks of alcohol    Types: 1 Shots of liquor, 1 Standard drinks or equivalent per week   Drug use: No       OPHTHALMIC EXAM:  Base Eye Exam     Visual Acuity (Snellen - Linear)       Right Left   Dist cc 20/20 -2 20/25     Correction: Glasses         Tonometry (Tonopen, 7:56 AM)       Right Left   Pressure 13 15         Pupils       Dark Light Shape React APD   Right 3 2 Round Minimal None   Left 4 3 Round Minimal None         Visual Fields (Counting fingers)  Left Right    Full Full         Extraocular Movement       Right Left    Full, Ortho Full, Ortho         Neuro/Psych     Oriented x3: Yes   Mood/Affect: Normal         Dilation     Both eyes: 1.0% Mydriacyl, 2.5% Phenylephrine @ 7:56 AM           Slit Lamp and Fundus Exam     Slit Lamp Exam       Right Left   Lids/Lashes Dermatochalasis - upper lid Dermatochalasis - upper lid, mild MGD   Conjunctiva/Sclera White and quiet Mild nasal and temporal pinguecula   Cornea mild arcus, 1+ fine PEE, trace tear film debris, +iron lines mild arcus, 1+ fine PEE, trace tear film debris, +iron lines   Anterior Chamber moderate depth, narrow angles moderate depth, narrow angles   Iris Round and dilated Round and dilated   Lens 2+ Nuclear sclerosis, 2-3+ Cortical cataract 2-3+ Nuclear sclerosis, 2-3+ Cortical cataract   Anterior Vitreous Vitreous syneresis, Posterior vitreous detachment Vitreous syneresis, Posterior vitreous detachment         Fundus Exam       Right Left   Disc 2-3+ pallor, Sharp rim, +cupping, mild PPA, Thin superior rim Pink and Sharp, mild peripapillary schisis superior to disc -- resolved   C/D Ratio 0.85 0.65   Macula Flat, good foveal reflex, RPE mottling, No heme or edema Flat, Blunted foveal reflex, RPE mottling, No heme or edema   Vessels mild attenuation mild attenuation, mild tortuosity   Periphery Attached, mild reticular degeneration, No heme Attached, mild reticular degeneration, No heme           Refraction     Wearing Rx       Sphere Cylinder Axis Add   Right +0.50 +0.75 012 +2.25   Left +0.50 +1.00 146 +2.25           IMAGING AND PROCEDURES  Imaging and  Procedures for 04/22/2023  OCT, Retina - OU - Both Eyes       Right Eye Quality was good. Central Foveal Thickness: 290. Progression has been stable. Findings include normal foveal contour, no IRF, no SRF.   Left Eye Quality was good. Central Foveal Thickness: 287. Progression has been stable. Findings include normal foveal contour, no SRF, intraretinal fluid (multilaminar schisis above disc -- stably improved / resolved).   Notes *Images captured and stored on drive  Diagnosis / Impression:  NFP, no IRF/SRF OU OS: multilaminar schisis above disc -- stably improved / resolved  Clinical management:  See below  Abbreviations: NFP - Normal foveal profile. CME - cystoid macular edema. PED - pigment epithelial detachment. IRF - intraretinal fluid. SRF - subretinal fluid. EZ - ellipsoid zone. ERM - epiretinal membrane. ORA - outer retinal atrophy. ORT - outer retinal tubulation. SRHM - subretinal hyper-reflective material. IRHM - intraretinal hyper-reflective material            ASSESSMENT/PLAN:    ICD-10-CM   1. Retinoschisis and retinal cysts of left eye  H33.192 OCT, Retina - OU - Both Eyes    2. Essential hypertension  I10     3. Hypertensive retinopathy of both eyes  H35.033 OCT, Retina - OU - Both Eyes    4. Combined forms of age-related cataract of both eyes  H25.813     5. Low-tension glaucoma of both  eyes, unspecified glaucoma stage  H40.1230      1. Retinoschisis OS -- stably resolved  - focal peripapillary patch of multilaminar retinoschisis superior to disc  - OCT shows multilaminar schisis above disc stably improved/resolved  - FA 3.17.22 -- no leakage or CNV  - discussed findings, prognosis  - pt reports schisis presented shortly after severe fall on face/chin  - no retinal / ocular intervention indicated or recommended  - monitor  - pt is cleared from a retina standpoint for release to Dr. Loraine Grip and resumption of primary eye care  2,3. Hypertensive  retinopathy OU - discussed importance of tight BP control - monitor  4. Mixed Cataract OU - The symptoms of cataract, surgical options, and treatments and risks were discussed with patient. - discussed diagnosis and progression - not yet visually significant - monitor  5. Low tension glaucoma OU  - under the expert management of Dr. Loraine Grip  - IOP today: 13,15  - on Cosopt BID OU, Rocklatan QHS OU, Brimonidine inc TID OD  Ophthalmic Meds Ordered this visit:  No orders of the defined types were placed in this encounter.    Return if symptoms worsen or fail to improve.  There are no Patient Instructions on file for this visit.   Explained the diagnoses, plan, and follow up with the patient and they expressed understanding.  Patient expressed understanding of the importance of proper follow up care.   This document serves as a record of services personally performed by Karie Chimera, MD, PhD. It was created on their behalf by Gerilyn Nestle, COT an ophthalmic technician. The creation of this record is the provider's dictation and/or activities during the visit.    Electronically signed by:  Gerilyn Nestle, COT  04.30.24 5:14 PM  This document serves as a record of services personally performed by Karie Chimera, MD, PhD. It was created on their behalf by Glee Arvin. Manson Passey, OA an ophthalmic technician. The creation of this record is the provider's dictation and/or activities during the visit.    Electronically signed by: Glee Arvin. Manson Passey, [place] 05.14.2024 5:14 PM  Karie Chimera, M.D., Ph.D. Diseases & Surgery of the Retina and Vitreous [facility] 04/22/2023   I have reviewed the above documentation for accuracy and completeness, and I agree with the above. Karie Chimera, M.D., Ph.D. 04/23/23 5:14 PM  Abbreviations: M myopia (nearsighted); A astigmatism; H hyperopia (farsighted); P presbyopia; Mrx spectacle prescription;  CTL contact lenses; OD right  eye; OS left eye; OU both eyes  XT exotropia; ET esotropia; PEK punctate epithelial keratitis; PEE punctate epithelial erosions; DES dry eye syndrome; MGD meibomian gland dysfunction; ATs artificial tears; PFAT's preservative free artificial tears; NSC nuclear sclerotic cataract; PSC posterior subcapsular cataract; ERM epi-retinal membrane; PVD posterior vitreous detachment; RD retinal detachment; DM diabetes mellitus; DR diabetic retinopathy; NPDR non-proliferative diabetic retinopathy; PDR proliferative diabetic retinopathy; CSME clinically significant macular edema; DME diabetic macular edema; dbh dot blot hemorrhages; CWS cotton wool spot; POAG primary open angle glaucoma; C/D cup-to-disc ratio; HVF humphrey visual field; GVF goldmann visual field; OCT optical coherence tomography; IOP intraocular pressure; BRVO Branch retinal vein occlusion; CRVO central retinal vein occlusion; CRAO central retinal artery occlusion; BRAO branch retinal artery occlusion; RT retinal tear; SB scleral buckle; PPV pars plana vitrectomy; VH Vitreous hemorrhage; PRP panretinal laser photocoagulation; IVK intravitreal kenalog; VMT vitreomacular traction; MH Macular hole;  NVD neovascularization of the disc; NVE neovascularization elsewhere; AREDS age related eye disease study;  ARMD age related macular degeneration; POAG primary open angle glaucoma; EBMD epithelial/anterior basement membrane dystrophy; ACIOL anterior chamber intraocular lens; IOL intraocular lens; PCIOL posterior chamber intraocular lens; Phaco/IOL phacoemulsification with intraocular lens placement; Clarion photorefractive keratectomy; LASIK laser assisted in situ keratomileusis; HTN hypertension; DM diabetes mellitus; COPD chronic obstructive pulmonary disease

## 2023-04-22 ENCOUNTER — Ambulatory Visit (INDEPENDENT_AMBULATORY_CARE_PROVIDER_SITE_OTHER): Payer: PPO | Admitting: Ophthalmology

## 2023-04-22 ENCOUNTER — Encounter (INDEPENDENT_AMBULATORY_CARE_PROVIDER_SITE_OTHER): Payer: Self-pay | Admitting: Ophthalmology

## 2023-04-22 DIAGNOSIS — H33192 Other retinoschisis and retinal cysts, left eye: Secondary | ICD-10-CM | POA: Diagnosis not present

## 2023-04-22 DIAGNOSIS — H35033 Hypertensive retinopathy, bilateral: Secondary | ICD-10-CM

## 2023-04-22 DIAGNOSIS — H25813 Combined forms of age-related cataract, bilateral: Secondary | ICD-10-CM | POA: Diagnosis not present

## 2023-04-22 DIAGNOSIS — H401230 Low-tension glaucoma, bilateral, stage unspecified: Secondary | ICD-10-CM | POA: Diagnosis not present

## 2023-04-22 DIAGNOSIS — I10 Essential (primary) hypertension: Secondary | ICD-10-CM

## 2023-04-23 ENCOUNTER — Encounter (INDEPENDENT_AMBULATORY_CARE_PROVIDER_SITE_OTHER): Payer: Self-pay | Admitting: Ophthalmology

## 2023-04-25 DIAGNOSIS — C672 Malignant neoplasm of lateral wall of bladder: Secondary | ICD-10-CM | POA: Diagnosis not present

## 2023-04-28 ENCOUNTER — Other Ambulatory Visit: Payer: Self-pay | Admitting: Urology

## 2023-04-29 ENCOUNTER — Encounter (HOSPITAL_BASED_OUTPATIENT_CLINIC_OR_DEPARTMENT_OTHER): Payer: Self-pay | Admitting: Urology

## 2023-04-29 NOTE — Progress Notes (Addendum)
Reviewing pt chart for pre-op interview for surgery on 05-06-2023 by Dr Annabell Howells @ [facility].  Pt with known bilateral carotid stenosis w/ hx CVA and a previous echo that was done 06-29-2020 that shows moderate aortic valve moderate AR , moderate valve calcification, MG 33.mmHg, valve area 0.85 cm^2. Chart reviewed with anesthesia , by Dr Okey Dupre MDA. Dr Okey Dupre stated pt need to get another echo done prior to surgery due to  severe aortic valve stenosis due to valve area 0.85 cm^2 and needs to be moved to main OR.  Also, updated pt's medical history in epic per Dr Okey Dupre. Called and left message for Pam, OR scheduler for Dr Annabell Howells, informed her of Dr Okey Dupre recommendation and move pt to main OR.

## 2023-04-30 ENCOUNTER — Telehealth: Payer: Self-pay | Admitting: Family Medicine

## 2023-04-30 ENCOUNTER — Other Ambulatory Visit: Payer: Self-pay | Admitting: Family Medicine

## 2023-04-30 DIAGNOSIS — I358 Other nonrheumatic aortic valve disorders: Secondary | ICD-10-CM

## 2023-04-30 NOTE — Telephone Encounter (Signed)
Pts wife called requesting to speak with Dr Oris Drone nurse regarding his surgery clearance appt.

## 2023-04-30 NOTE — Progress Notes (Signed)
Ordered echocardiogram for the patient

## 2023-04-30 NOTE — Telephone Encounter (Signed)
Wife needs to cancel preop clearance appt in June. Pt would like to come in this Friday where he was originally scheduled.  Appt scheduled for 5/24 at 2:20pm

## 2023-05-01 ENCOUNTER — Other Ambulatory Visit: Payer: PPO

## 2023-05-01 DIAGNOSIS — E039 Hypothyroidism, unspecified: Secondary | ICD-10-CM | POA: Diagnosis not present

## 2023-05-01 DIAGNOSIS — Z01818 Encounter for other preprocedural examination: Secondary | ICD-10-CM

## 2023-05-01 DIAGNOSIS — N401 Enlarged prostate with lower urinary tract symptoms: Secondary | ICD-10-CM | POA: Diagnosis not present

## 2023-05-01 DIAGNOSIS — R351 Nocturia: Secondary | ICD-10-CM | POA: Diagnosis not present

## 2023-05-01 LAB — LIPID PANEL

## 2023-05-01 LAB — COAGUCHEK XS/INR WAIVED
INR: 1 (ref 0.9–1.1)
Prothrombin Time: 11.9 s

## 2023-05-01 LAB — CBC WITH DIFFERENTIAL/PLATELET
Basophils Absolute: 0.1 10*3/uL (ref 0.0–0.2)
EOS (ABSOLUTE): 0.2 10*3/uL (ref 0.0–0.4)
Hemoglobin: 12.8 g/dL — ABNORMAL LOW (ref 13.0–17.7)
Immature Grans (Abs): 0 10*3/uL (ref 0.0–0.1)
WBC: 7.5 10*3/uL (ref 3.4–10.8)

## 2023-05-01 LAB — CMP14+EGFR

## 2023-05-02 ENCOUNTER — Ambulatory Visit (INDEPENDENT_AMBULATORY_CARE_PROVIDER_SITE_OTHER): Payer: PPO | Admitting: Family Medicine

## 2023-05-02 ENCOUNTER — Ambulatory Visit: Payer: PPO

## 2023-05-02 ENCOUNTER — Encounter: Payer: Self-pay | Admitting: Family Medicine

## 2023-05-02 VITALS — BP 134/66 | HR 63 | Ht 69.0 in | Wt 178.0 lb

## 2023-05-02 DIAGNOSIS — E039 Hypothyroidism, unspecified: Secondary | ICD-10-CM | POA: Diagnosis not present

## 2023-05-02 DIAGNOSIS — Z01818 Encounter for other preprocedural examination: Secondary | ICD-10-CM

## 2023-05-02 LAB — CBC WITH DIFFERENTIAL/PLATELET
Basos: 1 %
Eos: 2 %
Hematocrit: 38.8 % (ref 37.5–51.0)
Immature Granulocytes: 0 %
Lymphocytes Absolute: 1.5 10*3/uL (ref 0.7–3.1)
Lymphs: 20 %
MCH: 29 pg (ref 26.6–33.0)
MCHC: 33 g/dL (ref 31.5–35.7)
MCV: 88 fL (ref 79–97)
Monocytes Absolute: 0.6 10*3/uL (ref 0.1–0.9)
Monocytes: 8 %
Neutrophils Absolute: 5.1 10*3/uL (ref 1.4–7.0)
Neutrophils: 69 %
Platelets: 196 10*3/uL (ref 150–450)
RBC: 4.42 x10E6/uL (ref 4.14–5.80)
RDW: 12.8 % (ref 11.6–15.4)

## 2023-05-02 LAB — PSA, TOTAL AND FREE
PSA, Free Pct: 32 %
PSA, Free: 0.16 ng/mL
Prostate Specific Ag, Serum: 0.5 ng/mL (ref 0.0–4.0)

## 2023-05-02 LAB — LIPID PANEL
HDL: 62 mg/dL (ref >39)
LDL Chol Calc (NIH): 112 mg/dL — ABNORMAL HIGH (ref 0–99)
VLDL Cholesterol Cal: 15 mg/dL (ref 5–40)

## 2023-05-02 LAB — CMP14+EGFR
AST: 25 IU/L (ref 0–40)
Albumin/Globulin Ratio: 2 (ref 1.2–2.2)
Albumin: 4.3 g/dL (ref 3.7–4.7)
Alkaline Phosphatase: 60 IU/L (ref 44–121)
BUN: 17 mg/dL (ref 8–27)
Bilirubin Total: 0.5 mg/dL (ref 0.0–1.2)
CO2: 22 mmol/L (ref 20–29)
Calcium: 9.2 mg/dL (ref 8.6–10.2)
Chloride: 103 mmol/L (ref 96–106)
Creatinine, Ser: 1.1 mg/dL (ref 0.76–1.27)
Potassium: 4.2 mmol/L (ref 3.5–5.2)
Sodium: 141 mmol/L (ref 134–144)
eGFR: 67 mL/min/{1.73_m2} (ref >59)

## 2023-05-02 LAB — TSH: TSH: 7.67 u[IU]/mL — ABNORMAL HIGH (ref 0.450–4.500)

## 2023-05-02 MED ORDER — LEVOTHYROXINE SODIUM 100 MCG PO TABS: 100.0000 ug | ORAL_TABLET | Freq: Every day | ORAL | 1 refills | Status: DC

## 2023-05-02 NOTE — Progress Notes (Signed)
BP 134/66   Pulse 63   Ht 5\' 9"  (1.753 m)   Wt 178 lb (80.7 kg)   SpO2 97%   BMI 26.29 kg/m    Subjective:   Patient ID: Anthony Noble., male    DOB: 1941-08-29, 82 y.o.   MRN: [id number]  HPI: Anthony Noble. is a 82 y.o. male presenting on 05/02/2023 for Surgical Clearance   HPI Preoperative clearance Patient is coming in today for preoperative clearance for cystoscopy with biopsy and bilateral pyelogram.  He denies any shortness of breath or chest pain or tightness.  He can walk distances without getting shortness of breath including doing an elliptical for up to 60 minutes.  His thyroid was a little bit off on the blood work and we are going to adjust up the medicine.  We instructed him on what medicines to hold prior to the surgery.  Hypothyroidism recheck Patient is coming in for thyroid recheck today as well. They deny any issues with hair changes or heat or cold problems or diarrhea or constipation. They deny any chest pain or palpitations. They are currently on levothyroxine 88 micrograms   Relevant past medical, surgical, family and social history reviewed and updated as indicated. Interim medical history since our last visit reviewed. Allergies and medications reviewed and updated.  Review of Systems  Constitutional:  Negative for chills and fever.  HENT:  Negative for ear pain and tinnitus.   Eyes:  Negative for pain and discharge.  Respiratory:  Negative for cough, shortness of breath and wheezing.   Cardiovascular:  Negative for chest pain, palpitations and leg swelling.  Gastrointestinal:  Negative for abdominal pain, blood in stool, constipation and diarrhea.  Genitourinary:  Negative for dysuria and hematuria.  Musculoskeletal:  Negative for back pain, gait problem and myalgias.  Skin:  Negative for rash.  Neurological:  Negative for dizziness, weakness and headaches.  Psychiatric/Behavioral:  Negative for suicidal ideas.   All other systems  reviewed and are negative.   Per HPI unless specifically indicated above   Allergies as of 05/02/2023       Reactions   Bcg Live Other (See Comments)   Treatment pt had- shakes   Septra [bactrim] Other (See Comments)   Muscle weakness, numbness   Sulfa Antibiotics Other (See Comments)   Severe weakness   Sulfamethoxazole-trimethoprim Other (See Comments)   Muscle weakness, numbness Muscle weakness, numbness   Pertussis Vaccines    History of Gillian barrie        Medication List        Accurate as of May 02, 2023  3:24 PM. If you have any questions, ask your nurse or doctor.          atorvastatin 20 MG tablet Commonly known as: LIPITOR Take 1 tablet (20 mg total) by mouth daily.   BENADRYL ALLERGY PO Take by mouth as needed.   BIOTIN 5000 PO Take 5,000 mg by mouth daily.   brimonidine 0.2 % ophthalmic solution Commonly known as: ALPHAGAN Place into the right eye 2 (two) times daily.   cholecalciferol 1000 units tablet Commonly known as: VITAMIN D Take 5,000 Units by mouth daily.   CoQ10 200 MG Caps Take 1 capsule by mouth daily.   CRANBERRY PO Take 4,200 mg by mouth daily.   dorzolamide-timolol 2-0.5 % ophthalmic solution Commonly known as: COSOPT Place 1 drop into both eyes 2 (two) times daily.   ferrous gluconate 240 (27 FE) MG tablet Commonly known  asLaurey Morale Take 1 tablet (240 mg total) by mouth daily.   fexofenadine 180 MG tablet Commonly known as: ALLEGRA Take 180 mg by mouth daily.   Fish Oil 1200 MG Caps Take by mouth.   folic acid 400 MCG tablet Commonly known as: V-R FOLIC ACID Take 1 tablet (400 mcg total) by mouth daily.   HAIR SKIN NAILS PO Take 1 tablet by mouth daily.   hyoscyamine 0.125 MG SL tablet Commonly known as: LEVSIN SL DISSOLVE 1 TABLET IN MOUTH EVERY 8 HOURS AS NEEDED   KRILL OIL PO Take 1 Capful by mouth daily.   latanoprost 0.005 % ophthalmic solution Commonly known as: XALATAN Place 1 drop into both  eyes at bedtime.   levothyroxine 100 MCG tablet Commonly known as: SYNTHROID Take 1 tablet (100 mcg total) by mouth daily before breakfast. What changed:  medication strength how much to take Changed by: Elige Radon Sande Pickert, MD   Magnesium 500 MG Caps Take 1 capsule by mouth daily.   multivitamin with minerals Tabs tablet Take 1 tablet by mouth daily.   NON FORMULARY Take 1 Dose by mouth at bedtime. CBD Gummy   NON FORMULARY Take 1 Dose by mouth daily. Flavonoid         Objective:   BP 134/66   Pulse 63   Ht 5\' 9"  (1.753 m)   Wt 178 lb (80.7 kg)   SpO2 97%   BMI 26.29 kg/m   Wt Readings from Last 3 Encounters:  05/02/23 178 lb (80.7 kg)  02/11/23 182 lb 12.8 oz (82.9 kg)  11/28/22 182 lb (82.6 kg)    Physical Exam Vitals reviewed.  Constitutional:      General: He is not in acute distress.    Appearance: He is well-developed. He is not diaphoretic.  HENT:     Right Ear: External ear normal.     Left Ear: External ear normal.     Nose: Nose normal.     Mouth/Throat:     Pharynx: No oropharyngeal exudate.  Eyes:     General: No scleral icterus.       Right eye: No discharge.     Conjunctiva/sclera: Conjunctivae normal.     Pupils: Pupils are equal, round, and reactive to light.  Neck:     Thyroid: No thyromegaly.  Cardiovascular:     Rate and Rhythm: Normal rate and regular rhythm.     Heart sounds: Murmur heard.  Pulmonary:     Effort: Pulmonary effort is normal. No respiratory distress.     Breath sounds: Normal breath sounds. No wheezing.  Abdominal:     General: Bowel sounds are normal. There is no distension.     Palpations: Abdomen is soft.     Tenderness: There is no abdominal tenderness. There is no guarding or rebound.  Musculoskeletal:        General: Normal range of motion.     Cervical back: Neck supple.  Lymphadenopathy:     Cervical: No cervical adenopathy.  Skin:    General: Skin is warm and dry.     Findings: No rash.   Neurological:     Mental Status: He is alert and oriented to person, place, and time.     Coordination: Coordination normal.  Psychiatric:        Behavior: Behavior normal.     Results for orders placed or performed in visit on 05/01/23  CBC with Differential/Platelet  Result Value Ref Range   WBC 7.5 3.4 -  10.8 x10E3/uL   RBC 4.42 4.14 - 5.80 x10E6/uL   Hemoglobin 12.8 (L) 13.0 - 17.7 g/dL   Hematocrit 81.1 91.4 - 51.0 %   MCV 88 79 - 97 fL   MCH 29.0 26.6 - 33.0 pg   MCHC 33.0 31.5 - 35.7 g/dL   RDW 78.2 95.6 - 21.3 %   Platelets 196 150 - 450 x10E3/uL   Neutrophils 69 Not Estab. %   Lymphs 20 Not Estab. %   Monocytes 8 Not Estab. %   Eos 2 Not Estab. %   Basos 1 Not Estab. %   Neutrophils Absolute 5.1 1.4 - 7.0 x10E3/uL   Lymphocytes Absolute 1.5 0.7 - 3.1 x10E3/uL   Monocytes Absolute 0.6 0.1 - 0.9 x10E3/uL   EOS (ABSOLUTE) 0.2 0.0 - 0.4 x10E3/uL   Basophils Absolute 0.1 0.0 - 0.2 x10E3/uL   Immature Granulocytes 0 Not Estab. %   Immature Grans (Abs) 0.0 0.0 - 0.1 x10E3/uL  CMP14+EGFR  Result Value Ref Range   Glucose 97 70 - 99 mg/dL   BUN 17 8 - 27 mg/dL   Creatinine, Ser 0.86 0.76 - 1.27 mg/dL   eGFR 67 >57 QI/ONG/2.95   BUN/Creatinine Ratio 15 10 - 24   Sodium 141 134 - 144 mmol/L   Potassium 4.2 3.5 - 5.2 mmol/L   Chloride 103 96 - 106 mmol/L   CO2 22 20 - 29 mmol/L   Calcium 9.2 8.6 - 10.2 mg/dL   Total Protein 6.5 6.0 - 8.5 g/dL   Albumin 4.3 3.7 - 4.7 g/dL   Globulin, Total 2.2 1.5 - 4.5 g/dL   Albumin/Globulin Ratio 2.0 1.2 - 2.2   Bilirubin Total 0.5 0.0 - 1.2 mg/dL   Alkaline Phosphatase 60 44 - 121 IU/L   AST 25 0 - 40 IU/L   ALT 19 0 - 44 IU/L  Lipid panel  Result Value Ref Range   Cholesterol, Total 189 100 - 199 mg/dL   Triglycerides 84 0 - 149 mg/dL   HDL 62 >28 mg/dL   VLDL Cholesterol Cal 15 5 - 40 mg/dL   LDL Chol Calc (NIH) 413 (H) 0 - 99 mg/dL   Chol/HDL Ratio 3.0 0.0 - 5.0 ratio  CoaguChek XS/INR Waived  Result Value Ref Range    INR 1.0 0.9 - 1.1   Prothrombin Time 11.9 sec  TSH  Result Value Ref Range   TSH 7.670 (H) 0.450 - 4.500 uIU/mL  PSA, total and free  Result Value Ref Range   Prostate Specific Ag, Serum 0.5 0.0 - 4.0 ng/mL   PSA, Free 0.16 N/A ng/mL   PSA, Free Pct 32.0 %    EKG: Sinus bradycardia at 59 with nonspecific ST changes Assessment & Plan:   Problem List Items Addressed This Visit       Endocrine   Hypothyroidism   Relevant Medications   levothyroxine (SYNTHROID) 100 MCG tablet   Other Visit Diagnoses     Preoperative clearance    -  Primary   Relevant Orders   EKG 12-Lead (Completed)     Patient had a prior echocardiogram showed mild to moderate aortic stenosis and does have a murmur on exam. He is asymptomatic so I do not think it is required to do echocardiogram prior to his surgery but if anesthesiologist wants [place] to require it then we do have a ordered for him to do.  Cleared for surgery from our end Follow up plan: Return in about 2 months (around 07/02/2023),  or if symptoms worsen or fail to improve, for Physical exam and recheck thyroid.  Counseling provided for all of the vaccine components Orders Placed This Encounter  Procedures   EKG 12-Lead    Arville Care, MD [place] [facility] 05/02/2023, 3:24 PM

## 2023-05-07 ENCOUNTER — Telehealth: Payer: Self-pay | Admitting: Family Medicine

## 2023-05-07 DIAGNOSIS — E039 Hypothyroidism, unspecified: Secondary | ICD-10-CM

## 2023-05-07 MED ORDER — LEVOTHYROXINE SODIUM 100 MCG PO TABS
100.0000 ug | ORAL_TABLET | Freq: Every day | ORAL | 1 refills | Status: DC
Start: 2023-05-07 — End: 2023-10-30

## 2023-05-07 NOTE — Telephone Encounter (Signed)
Still waiting on call for appt for echocardiogram and surgery is June 4th.

## 2023-05-07 NOTE — Patient Instructions (Signed)
DUE TO COVID-19 ONLY TWO VISITORS  (aged 81 and older)  ARE ALLOWED TO COME WITH YOU AND STAY IN THE WAITING ROOM ONLY DURING PRE OP AND PROCEDURE.   **NO VISITORS ARE ALLOWED IN THE SHORT STAY AREA OR RECOVERY ROOM!!**  IF YOU WILL BE ADMITTED INTO THE HOSPITAL YOU ARE ALLOWED ONLY FOUR SUPPORT PEOPLE DURING VISITATION HOURS ONLY (7 AM -8PM)   The support person(s) must pass our screening, gel in and out, and wear a mask at all times, including in the patient's room. Patients must also wear a mask when staff or their support person are in the room. Visitors GUEST BADGE MUST BE WORN VISIBLY  One adult visitor may remain with you overnight and MUST be in the room by 8 P.M.     Your procedure is scheduled on: 05/13/23   Report to [facility] Main Entrance    Report to admitting at : 8:30 AM   Call this number if you have problems the morning of surgery [phone]   Do not eat food :After Midnight.   After Midnight you may have the following liquids until : 7:30 AM DAY OF SURGERY  Water Black Coffee (sugar ok, NO MILK/CREAM OR CREAMERS)  Tea (sugar ok, NO MILK/CREAM OR CREAMERS) regular and decaf                             Plain Jell-O (NO RED)                                           Fruit ices (not with fruit pulp, NO RED)                                     Popsicles (NO RED)                                                                  Juice: apple, WHITE grape, WHITE cranberry Sports drinks like Gatorade (NO RED)              Oral Hygiene is also important to reduce your risk of infection.                                    Remember - BRUSH YOUR TEETH THE MORNING OF SURGERY WITH YOUR REGULAR TOOTHPASTE  DENTURES WILL BE REMOVED PRIOR TO SURGERY PLEASE DO NOT APPLY "Poly grip" OR ADHESIVES!!!   Do NOT smoke after Midnight   Take these medicines the morning of surgery with A SIP OF WATER: loratadine,levothyroxine.Use eye drops as usual.                               You may not have any metal on your body including hair pins, jewelry, and body piercing             Do not wear  lotions, powders, perfumes/cologne, or  deodorant              Men may shave face and neck.   Do not bring valuables to the hospital. [facility] IS NOT             RESPONSIBLE   FOR VALUABLES.   Contacts, glasses, or bridgework may not be worn into surgery.   Bring small overnight bag day of surgery.   DO NOT BRING YOUR HOME MEDICATIONS TO THE HOSPITAL. PHARMACY WILL DISPENSE MEDICATIONS LISTED ON YOUR MEDICATION LIST TO YOU DURING YOUR ADMISSION IN THE HOSPITAL!    Patients discharged on the day of surgery will not be allowed to drive home.  Someone NEEDS to stay with you for the first 24 hours after anesthesia.   Special Instructions: Bring a copy of your healthcare power of attorney and living will documents         the day of surgery if you haven't scanned them before.              Please read over the following fact sheets you were given: IF YOU HAVE QUESTIONS ABOUT YOUR PRE-OP INSTRUCTIONS PLEASE CALL [phone]    [facility] - Preparing for Surgery Before surgery, you can play an important role.  Because skin is not sterile, your skin needs to be as free of germs as possible.  You can reduce the number of germs on your skin by washing with CHG (chlorahexidine gluconate) soap before surgery.  CHG is an antiseptic cleaner which kills germs and bonds with the skin to continue killing germs even after washing. Please DO NOT use if you have an allergy to CHG or antibacterial soaps.  If your skin becomes reddened/irritated stop using the CHG and inform your nurse when you arrive at Short Stay. Do not shave (including legs and underarms) for at least 48 hours prior to the first CHG shower.  You may shave your face/neck. Please follow these instructions carefully:  1.  Shower with CHG Soap the night before surgery and the  morning of Surgery.  2.  If you choose to wash your  hair, wash your hair first as usual with your  normal  shampoo.  3.  After you shampoo, rinse your hair and body thoroughly to remove the  shampoo.                           4.  Use CHG as you would any other liquid soap.  You can apply chg directly  to the skin and wash                       Gently with a scrungie or clean washcloth.  5.  Apply the CHG Soap to your body ONLY FROM THE NECK DOWN.   Do not use on face/ open                           Wound or open sores. Avoid contact with eyes, ears mouth and genitals (private parts).                       Wash face,  Genitals (private parts) with your normal soap.             6.  Wash thoroughly, paying special attention to the area where your surgery  will be performed.  7.  Thoroughly  rinse your body with warm water from the neck down.  8.  DO NOT shower/wash with your normal soap after using and rinsing off  the CHG Soap.                9.  Pat yourself dry with a clean towel.            10.  Wear clean pajamas.            11.  Place clean sheets on your bed the night of your first shower and do not  sleep with pets. Day of Surgery : Do not apply any lotions/deodorants the morning of surgery.  Please wear clean clothes to the hospital/surgery center.  FAILURE TO FOLLOW THESE INSTRUCTIONS MAY RESULT IN THE CANCELLATION OF YOUR SURGERY PATIENT SIGNATURE_________________________________  NURSE SIGNATURE__________________________________  ________________________________________________________________________

## 2023-05-07 NOTE — Telephone Encounter (Signed)
Aware refill sent to [facility] 

## 2023-05-08 ENCOUNTER — Other Ambulatory Visit: Payer: Self-pay

## 2023-05-08 ENCOUNTER — Encounter (HOSPITAL_COMMUNITY)
Admission: RE | Admit: 2023-05-08 | Discharge: 2023-05-08 | Disposition: A | Discharge status: Home / Self Care | Payer: PPO | Source: Ambulatory Visit | Attending: Urology | Admitting: Urology

## 2023-05-08 ENCOUNTER — Telehealth: Payer: Self-pay | Admitting: Family Medicine

## 2023-05-08 ENCOUNTER — Encounter (HOSPITAL_COMMUNITY): Payer: Self-pay

## 2023-05-08 DIAGNOSIS — Z01818 Encounter for other preprocedural examination: Secondary | ICD-10-CM | POA: Diagnosis not present

## 2023-05-08 HISTORY — DX: Atherosclerotic heart disease of native coronary artery without angina pectoris: I25.10

## 2023-05-08 HISTORY — DX: Tinnitus, unspecified ear: H93.19

## 2023-05-08 HISTORY — DX: Anemia, unspecified: D64.9

## 2023-05-08 NOTE — Telephone Encounter (Signed)
Patient is needing an echocardiogram before 05/13/23 when he has a surgery scheduled and said it was discussed with Dettinger. Please call back and advise.

## 2023-05-08 NOTE — Telephone Encounter (Signed)
Yes order has been placed for the echocardiogram, can we check on this

## 2023-05-08 NOTE — Progress Notes (Signed)
For Short Stay: COVID SWAB appointment date:  Bowel Prep reminder:   For Anesthesia: PCP - Dr. Ivin Booty Dettinger. : Clearance: 05/02/23 Cardiologist -   Chest x-ray -  EKG - 05/02/23 Stress Test -  ECHO - 06/29/20 Cardiac Cath -  Pacemaker/ICD device last checked: Pacemaker orders received: Device Rep notified:  Spinal Cord Stimulator: N/A  Sleep Study - Yes CPAP - No  Fasting Blood Sugar - N/A Checks Blood Sugar _____ times a day Date and result of last Hgb A1c-  Last dose of GLP1 agonist-  GLP1 instructions:   Last dose of SGLT-2 inhibitors- N/A SGLT-2 instructions:   Blood Thinner Instructions: N/A Aspirin Instructions: Last Dose:  Activity level: Can go up a flight of stairs and activities of daily living without stopping and without chest pain and/or shortness of breath   Able to exercise without chest pain and/or shortness of breath  Anesthesia review: Hx: Mild aorta stenosis,Heart murmur.  Patient denies shortness of breath, fever, cough and chest pain at PAT appointment   Patient verbalized understanding of instructions that were given to them at the PAT appointment. Patient was also instructed that they will need to review over the PAT instructions again at home before surgery.

## 2023-05-12 ENCOUNTER — Encounter: Payer: Self-pay | Admitting: Family Medicine

## 2023-05-12 ENCOUNTER — Ambulatory Visit (HOSPITAL_COMMUNITY)
Admission: RE | Admit: 2023-05-12 | Discharge: 2023-05-12 | Disposition: A | Discharge status: Home / Self Care | Payer: PPO | Source: Ambulatory Visit | Attending: Family Medicine | Admitting: Family Medicine

## 2023-05-12 ENCOUNTER — Other Ambulatory Visit (HOSPITAL_COMMUNITY): Payer: PPO

## 2023-05-12 DIAGNOSIS — I350 Nonrheumatic aortic (valve) stenosis: Secondary | ICD-10-CM | POA: Diagnosis not present

## 2023-05-12 DIAGNOSIS — I351 Nonrheumatic aortic (valve) insufficiency: Secondary | ICD-10-CM | POA: Diagnosis not present

## 2023-05-12 DIAGNOSIS — I358 Other nonrheumatic aortic valve disorders: Secondary | ICD-10-CM

## 2023-05-12 LAB — ECHOCARDIOGRAM COMPLETE
AR max vel: 0.62 cm2
AV Area VTI: 0.7 cm2
AV Area mean vel: 0.7 cm2
AV Mean grad: 53 mmHg
AV Peak grad: 93.7 mmHg
Ao pk vel: 4.84 m/s
Area-P 1/2: 2.36 cm2
P 1/2 time: 684 msec
S' Lateral: 2.5 cm

## 2023-05-12 NOTE — Progress Notes (Signed)
Anesthesia Chart Review   Case: [id number] Date/Time: 05/13/23 1030   Procedure: CYSTOSCOPY WITH BIOPSY WIHT FULGURATION  BILATERAL RETROGRADE PYELOGRAM (Bilateral) - 1 HR FOR CASE   Anesthesia type: General   Pre-op diagnosis: BLADDER TUMOR   Location: [facility] PROCEDURE [facility] / [facility]   Surgeons: Bjorn Pippin, MD       DISCUSSION:82 y.o. former smoker with h/o CAD, severe AS, bladder cancer scheduled for above procedure 05/13/2023 with Dr. Bjorn Pippin.   Pt was previously scheduled at surgery center.  [place] nurse discussed with Dr. Okey Dupre, due to severe AS and no echo since 2021 advised pt to be moved to main OR and have an updated Echo prior to procedure.    Seen by PCP 05/02/2023. Per OV note, "He denies any shortness of breath or chest pain or tightness. He can walk distances without getting shortness of breath including doing an elliptical for up to 60 minutes.  Patient had a prior echocardiogram showed mild to moderate aortic stenosis and does have a murmur on exam. He is asymptomatic so I do not think it is required to do echocardiogram prior to his surgery but if anesthesiologist wants [place] to require it then we do have a ordered for him to do. Cleared for surgery from our end".    Echo 05/12/2023 with progression of severe AS, mean gradient 53.0 mmHg from 33.00 mmHg on previous echo, valve area now 0.70 cm2 from 0.86 cm2 on previous echo.   Discussed with Dr. Richardson Landry, ok to proceed.  VS: BP 128/65   Pulse 66   Temp 36.4 C (Oral)   Ht 5\' 9"  (1.753 m)   Wt 79.8 kg   SpO2 97%   BMI 25.99 kg/m   PROVIDERS: Dettinger, Elige Radon, MD   LABS: Labs reviewed: Acceptable for surgery. (all labs ordered are listed, but only abnormal results are displayed)  Labs Reviewed - No data to display   IMAGES:   EKG:   CV: Echo 06/29/20  1. Left ventricular ejection fraction, by estimation, is 60 to 65%. The  left ventricle has normal function. The left ventricle has no regional  wall motion  abnormalities. There is moderate left ventricular hypertrophy.  Left ventricular diastolic  parameters are consistent with Grade I diastolic dysfunction (impaired  relaxation). Elevated left atrial pressure.   2. Right ventricular systolic function is normal. The right ventricular  size is normal. There is normal pulmonary artery systolic pressure.   3. The mitral valve is normal in structure. Mild mitral valve  regurgitation. No evidence of mitral stenosis.   4. The aortic valve has an indeterminant number of cusps. Aortic valve  regurgitation is mild to moderate. There is moderate calcification of the  aortic valve. Aortic valve mean gradient measures 33.0 mmHg. Aortic valve  peak gradient measures 47.0 mmHg.  Aortic valve area, by VTI measures 0.85 cm.   5. The inferior vena cava is normal in size with greater than 50%  respiratory variability, suggesting right atrial pressure of 3 mmHg.  Past Medical History:  Diagnosis Date   Anemia    Bilateral carotid artery stenosis without cerebral infarction    vascular--- dr Chestine Spore;   hx CVA;  last duplex  in epic 02-11-2023 left ICA 1-39%,  righrt ICA 40-59%,  bilateral ECA >50%   , pt to maintain on ASA 81mg  and statin   BPH (benign prostatic hypertrophy) with urinary obstruction    urologist--- dr Annabell Howells   Cancer [facility])    Coronary  artery disease    Diverticulosis of colon    Glaucoma    Guillain Barr syndrome [facility]) 2001   Heart murmur March /  April 2016   History of bladder cancer    first dx 2004--  tx with chemo bladder instillation   History of bladder cancer 2004   s/p TURBT  and BCG treatment's   History of colon polyps    History of pleural effusion 2001   s/p  VATS w/ drainage   History of vertebral fracture 2001   2001--   T12   Hyperlipidemia    Hypothyroidism    followed by pcp   Pneumonia    Severe aortic valve stenosis    (04-29-2023  dx by anesthesia Dr Reece Agar. [facility] )  (pt does not have a cardiologsit)   last echo  in epic 06-29-2020;    moderate regurg,  moderate calcification, MG , valva area 0.85 cm^2   Tinnitus    Varicose veins of both lower extremities     Past Surgical History:  Procedure Laterality Date   CYSTO/  BILATERAL RETROGRADE PYLOGRAM/  RESECTION BLADDER TUMOR  07/30/2011   @[facility]   by dr Annabell Howells   CYSTOSCOPY WITH BIOPSY N/A 01/12/2015   Procedure: CYSTOSCOPY WITH BLADDER  BIOPSY, FULGERATION;  Surgeon: Anner Crete, MD;  Location: [facility];  Service: Urology;  Laterality: N/A;   ORIF RIGHT DISTAL RADIUS FX  12/31/2007   @[facility]   by dr c. blackman   TONSILLECTOMY     child   TRANSURETHRAL RESECTION OF BLADDER TUMOR WITH MITOMYCIN-C  06/07/2003   @[facility]  by dr Annabell Howells   VIDEO ASSISTED THORACOSCOPY (VATS)/EMPYEMA Right 05/01/2000   @[facility]   by dr hendrickson;  and Drainage of effusion    MEDICATIONS:  atorvastatin (LIPITOR) 20 MG tablet   brimonidine (ALPHAGAN) 0.2 % ophthalmic solution   calcium elemental as carbonate (TUMS ULTRA 1000) 400 MG chewable tablet   Cholecalciferol (VITAMIN D) 125 MCG (5000 UT) CAPS   Coenzyme Q10 (COQ10) 100 MG CAPS   CRANBERRY PO   diphenhydrAMINE (BENADRYL) 25 mg capsule   dorzolamide-timolol (COSOPT) 22.3-6.8 MG/ML ophthalmic solution   ferrous gluconate (FERGON) 240 (27 FE) MG tablet   ferrous sulfate 325 (65 FE) MG tablet   folic acid (V-R FOLIC ACID) 400 MCG tablet   hyoscyamine (LEVSIN SL) 0.125 MG SL tablet   levothyroxine (SYNTHROID) 100 MCG tablet   loratadine (CLARITIN) 10 MG tablet   Magnesium 400 MG TABS   Multiple Vitamin (MULTIVITAMIN WITH MINERALS) TABS tablet   Multiple Vitamins-Minerals (HAIR SKIN NAILS PO)   Netarsudil-Latanoprost (ROCKLATAN) 0.02-0.005 % SOLN   NON FORMULARY   NON FORMULARY   Omega-3 Fatty Acids (FISH OIL PO)   sodium chloride (OCEAN) 0.65 % SOLN nasal spray   No current facility-administered medications for this encounter.   Jodell Cipro Ward, PA-C [facility]-Surgical [facility] [phone]

## 2023-05-12 NOTE — Anesthesia Preprocedure Evaluation (Signed)
Anesthesia Evaluation  Patient identified by MRN, date of birth, ID band Patient awake    Reviewed: Allergy & Precautions, NPO status , Patient's Chart, lab work & pertinent test results  History of Anesthesia Complications Negative for: history of anesthetic complications  Airway Mallampati: III  TM Distance: >3 FB Neck ROM: Full    Dental  (+) Dental Advisory Given   Pulmonary neg shortness of breath, neg sleep apnea, neg COPD, neg recent URI, former smoker   Pulmonary exam normal breath sounds clear to auscultation       Cardiovascular (-) angina + CAD  (-) Past MI, (-) Cardiac Stents and (-) CABG (-) dysrhythmias + Valvular Problems/Murmurs (severe) AS  Rhythm:Regular Rate:Normal  HLD, bilateral carotid artery stenosis  TTE 05/12/2023: IMPRESSIONS     1. Left ventricular ejection fraction, by estimation, is 60 to 65%. The  left ventricle has normal function. The left ventricle has no regional  wall motion abnormalities. Left ventricular diastolic parameters are  consistent with Grade I diastolic  dysfunction (impaired relaxation).   2. Right ventricular systolic function is normal. The right ventricular  size is normal. Tricuspid regurgitation signal is inadequate for assessing  PA pressure.   3. Left atrial size was mildly dilated.   4. The mitral valve is degenerative. Trivial mitral valve regurgitation.   5. The aortic valve is calcified. There is severe calcifcation of the  aortic valve. Aortic valve regurgitation is mild. Severe aortic valve  stenosis. Aortic valve area, by VTI measures 0.70 cm. Aortic valve mean  gradient measures 53.0 mmHg. Aortic  valve Vmax measures 4.84 m/s.   6. The inferior vena cava is normal in size with greater than 50%  respiratory variability, suggesting right atrial pressure of 3 mmHg.     Neuro/Psych neg Seizures H/o T12 fracture  Neuromuscular disease (h/o Guillan Barre syndrome  2001)    GI/Hepatic Neg liver ROS,neg GERD  ,,diverticulosis   Endo/Other  neg diabetesHypothyroidism    Renal/GU negative Renal ROS   Bladder cancer    Musculoskeletal   Abdominal   Peds  Hematology  (+) Blood dyscrasia, anemia   Anesthesia Other Findings   Reproductive/Obstetrics                             Anesthesia Physical Anesthesia Plan  ASA: 4  Anesthesia Plan: General   Post-op Pain Management: Tylenol PO (pre-op)*   Induction: Intravenous  PONV Risk Score and Plan: 2 and Ondansetron, Dexamethasone and Treatment may vary due to age or medical condition  Airway Management Planned: LMA  Additional Equipment: Arterial line  Intra-op Plan:   Post-operative Plan: Extubation in OR  Informed Consent: I have reviewed the patients History and Physical, chart, labs and discussed the procedure including the risks, benefits and alternatives for the proposed anesthesia with the patient or authorized representative who has indicated his/her understanding and acceptance.     Dental advisory given  Plan Discussed with: CRNA and Anesthesiologist  Anesthesia Plan Comments: (See PAT note 05/08/2023  Risks of general anesthesia discussed including, but not limited to, sore throat, hoarse voice, chipped/damaged teeth, injury to vocal cords, nausea and vomiting, allergic reactions, lung infection, heart attack, stroke, and death. All questions answered. )       Anesthesia Quick Evaluation

## 2023-05-12 NOTE — H&P (Signed)
I have bladder cancer.  HPI: Anthony Noble is a 82 year-old male established patient who is here for bladder cancer.  His problem was diagnosed 07/30/2011.   His bladder cancer was treated by removal with scope. Patient denies removal of the entire bladder, radiation, and chemotherapy.   He is not having pain in new locations. He has not recently had unwanted weight loss.   Anthony Noble returns today in f/u for his history of CIS last found on biopsy in 2/16. He completed BCG/Intron in April 2016 and had maintenance in October 2016. He had a cytology that was negative on 02/03/19 and that was a stable finding. He is doing well with mild LUTS with some hesitancy. He has had no hematuria and a clear urine today.       AUA Symptom Score: Less than 20% of the time he has the sensation of not emptying his bladder completely when finished urinating. Less than 20% of the time he has to urinate again fewer than two hours after he has finished urinating. Less than 50% of the time he has to start and stop again several times when he urinates. Less than 20% of the time he finds it difficult to postpone urination. Less than 50% of the time he has a weak urinary stream. Less than 20% of the time he has to push or strain to begin urination. He has to get up to urinate 3 times from the time he goes to bed until the time he gets up in the morning.   Calculated AUA Symptom Score: 11    ALLERGIES: Septra TABS Sulfur     MEDICATIONS: Levothyroxine Sodium 75 mcg tablet  Atorvastatin Calcium 20 mg tablet  Biotin 5 mg tablet Oral  CoQ-10 200 MG CAPS Oral  Cranberry 1 PO Daily  Dorzolamide-Timolol  Flonase Allergy Relief  Folivane-F 125 mg-1 mg-40 mg-3 mg capsule  Latanoprost  Levsin 0.125 mg tablet PRN  Magnesium 400 MG CAPS Oral  Mucinex PRN  Multi-Vitamin TABS Oral  Nystatin  Vitamin D3     GU PSH: Cysto Fulgurate < 0.5 cm - 2016 Cystoscopy - 05/03/2022, 2022, 2021, 2020, 2019, 2018, 2017,  2017 Cystoscopy TURBT 2-5 cm - 2016, 2012, 2009       PSH Notes: Cystoscopy With Fulguration Minor Lesion (Under 5mm), Cystoscopy With Fulguration Medium Lesion (2-5cm), Cystoscopy With Fulguration Medium Lesion (2-5cm), Cystoscopy With Fulguration Medium Lesion (2-5cm), Wrist Surgery, Tonsillectomy   teeth extraction   NON-GU PSH: Remove Tonsils - 2008     GU PMH: BPH w/LUTS, He has mild LUTS with BPH and BOO on cystoscopy. - 05/03/2022, - 2022, Benign prostatic hyperplasia with urinary obstruction, - 2015 History of bladder cancer, He has no recurrent tumors. He will return in a year for cystoscopy. - 05/03/2022, No obvious recurrence. Cytology sent. cysto in a year. , - 2022, History of malignant neoplasm of bladder, - 2016 Nocturia - 05/03/2022, - 2022, Nocturia, - 2014 ED due to arterial insufficiency, Erectile dysfunction due to arterial insufficiency - 2016 Urinary Tract Inf, Unspec site, Pyuria - 2016 Personal Hx Urinary Tract Infections, History of urinary tract infection - 2016 Balanitis, Balanitis - 2015 Epididymitis, Epididymitis Right - 2014 Personal Hx Oth male genital organs diseases, History of prostatitis - 2014 Post-void dribbling, Post-void dribbling - 2014      PMH Notes:  2011-07-17 13:51:04 - Note: Vertebral Artery Stenosis   NON-GU PMH: Personal history of in-situ neoplasm of other site, History of carcinoma in situ of  bladder - 2017 Personal history of other specified conditions, History of fever - 2016 Encounter for general adult medical examination without abnormal findings, Encounter for preventive health examination - 2016 Hypothyroidism, Primary Hypothyroidism - 2014 Personal history of other endocrine, nutritional and metabolic disease, History of hypercholesterolemia - 2014 Glaucoma Tinnitus, bilateral    FAMILY HISTORY: Breast Cancer - Grandmother Family Health Status Number - Grandmother ovarian cancer - Mother   SOCIAL HISTORY: Marital Status:  Married Preferred Language: English; Ethnicity: Not Hispanic Or Latino; Race: White Current Smoking Status: Patient does not smoke anymore.  Does drink.  Patient's occupation Teacher, music.    REVIEW OF SYSTEMS:    GU Review Male:   Patient denies frequent urination, hard to postpone urination, burning/ pain with urination, get up at night to urinate, leakage of urine, stream starts and stops, trouble starting your stream, have to strain to urinate , erection problems, and penile pain.  Gastrointestinal (Upper):   Patient denies nausea, vomiting, and indigestion/ heartburn.  Gastrointestinal (Lower):   Patient denies diarrhea and constipation.  Constitutional:   Patient denies fever, night sweats, weight loss, and fatigue.  Skin:   Patient denies itching and skin rash/ lesion.  Eyes:   Patient denies blurred vision and double vision.  Ears/ Nose/ Throat:   Patient denies sore throat and sinus problems.  Hematologic/Lymphatic:   Patient denies swollen glands and easy bruising.  Cardiovascular:   Patient denies leg swelling and chest pains.  Respiratory:   Patient denies cough and shortness of breath.  Endocrine:   Patient denies excessive thirst.  Musculoskeletal:   Patient denies back pain and joint pain.  Neurological:   Patient denies headaches and dizziness.  Psychologic:   Patient denies depression and anxiety.   VITAL SIGNS: None   MULTI-SYSTEM PHYSICAL EXAMINATION:    Constitutional: Well-nourished. No physical deformities. Normally developed. Good grooming.  Respiratory: Normal breath sounds. No labored breathing, no use of accessory muscles.   Cardiovascular: Regular rate and rhythm. No murmur, no gallop.      PAST DATA REVIEW: None   PROCEDURES:         Flexible Cystoscopy - 52000  Risks, benefits, and some of the potential complications of the procedure were discussed. He was prepped with betadine and he was given Cipro 500mg    Meatus:  Normal size. Normal location.  Normal condition.  Urethra:  No strictures.  External Sphincter:  Normal.  Verumontanum:  Normal.  Prostate:  Borderline obstructing. Moderate hyperplasia.  Bladder Neck:  Non-obstructing.  Ureteral Orifices:  Normal location. Normal size. Normal shape. Effluxed clear urine.  Bladder:  Mild trabeculation. there is a ring of tiny papillary lesions on the left lateral wall that is concerning for a recurrent neoplasm. There is a scar on the right No stones.      The procedure was well tolerated and there were no complications.         Urinalysis - 81003 Dipstick Dipstick Cont'd  Color: Yellow Bilirubin: Neg  Appearance: Clear Ketones: Neg  Specific Gravity: 1.015 Blood: Neg  pH: <=5.0 Protein: Neg  Glucose: Neg Urobilinogen: 0.2    Nitrites: Neg    Leukocyte Esterase: Neg    Notes:      ASSESSMENT:      ICD-10 Details  1 GU:   Bladder Cancer Lateral - C67.2 Undiagnosed New Problem - There appears to be a ring like recurrence on the left bladder wall. I will get him set up for a cystoscopy with biopsy and  fulguration. I have reviewed the risks of bleeding, infection, bladder wall injury, need for secondary treatments, thrombotic events and anesthetic complications.

## 2023-05-13 ENCOUNTER — Telehealth: Payer: Self-pay | Admitting: Family Medicine

## 2023-05-13 ENCOUNTER — Encounter (HOSPITAL_COMMUNITY): Payer: Self-pay | Admitting: Urology

## 2023-05-13 ENCOUNTER — Ambulatory Visit (HOSPITAL_BASED_OUTPATIENT_CLINIC_OR_DEPARTMENT_OTHER): Payer: PPO | Admitting: Anesthesiology

## 2023-05-13 ENCOUNTER — Ambulatory Visit (HOSPITAL_COMMUNITY): Payer: PPO

## 2023-05-13 ENCOUNTER — Ambulatory Visit (HOSPITAL_COMMUNITY)
Admission: RE | Admit: 2023-05-13 | Discharge: 2023-05-13 | Disposition: A | Discharge status: Home / Self Care | Payer: PPO | Source: Ambulatory Visit | Attending: Urology | Admitting: Urology

## 2023-05-13 ENCOUNTER — Ambulatory Visit (HOSPITAL_COMMUNITY): Payer: PPO | Admitting: Physician Assistant

## 2023-05-13 ENCOUNTER — Encounter (HOSPITAL_COMMUNITY)
Admission: RE | Disposition: A | Discharge status: Home / Self Care | Payer: Self-pay | Source: Ambulatory Visit | Attending: Urology

## 2023-05-13 DIAGNOSIS — N3289 Other specified disorders of bladder: Secondary | ICD-10-CM | POA: Insufficient documentation

## 2023-05-13 DIAGNOSIS — E039 Hypothyroidism, unspecified: Secondary | ICD-10-CM | POA: Diagnosis not present

## 2023-05-13 DIAGNOSIS — C672 Malignant neoplasm of lateral wall of bladder: Secondary | ICD-10-CM | POA: Diagnosis not present

## 2023-05-13 DIAGNOSIS — E785 Hyperlipidemia, unspecified: Secondary | ICD-10-CM | POA: Diagnosis not present

## 2023-05-13 DIAGNOSIS — D630 Anemia in neoplastic disease: Secondary | ICD-10-CM

## 2023-05-13 DIAGNOSIS — Z8551 Personal history of malignant neoplasm of bladder: Secondary | ICD-10-CM | POA: Diagnosis not present

## 2023-05-13 DIAGNOSIS — Z87891 Personal history of nicotine dependence: Secondary | ICD-10-CM

## 2023-05-13 DIAGNOSIS — C679 Malignant neoplasm of bladder, unspecified: Secondary | ICD-10-CM

## 2023-05-13 DIAGNOSIS — Z09 Encounter for follow-up examination after completed treatment for conditions other than malignant neoplasm: Secondary | ICD-10-CM | POA: Insufficient documentation

## 2023-05-13 DIAGNOSIS — D638 Anemia in other chronic diseases classified elsewhere: Secondary | ICD-10-CM | POA: Diagnosis not present

## 2023-05-13 DIAGNOSIS — I350 Nonrheumatic aortic (valve) stenosis: Secondary | ICD-10-CM | POA: Diagnosis not present

## 2023-05-13 DIAGNOSIS — I2510 Atherosclerotic heart disease of native coronary artery without angina pectoris: Secondary | ICD-10-CM | POA: Diagnosis not present

## 2023-05-13 DIAGNOSIS — Z08 Encounter for follow-up examination after completed treatment for malignant neoplasm: Secondary | ICD-10-CM | POA: Insufficient documentation

## 2023-05-13 HISTORY — PX: CYSTOSCOPY WITH BIOPSY: SHX5122

## 2023-05-13 HISTORY — DX: Nonrheumatic aortic (valve) stenosis: I35.0

## 2023-05-13 HISTORY — DX: Asymptomatic varicose veins of bilateral lower extremities: I83.93

## 2023-05-13 SURGERY — CYSTOSCOPY, WITH BIOPSY
Anesthesia: General | Site: Urethra | Laterality: Bilateral

## 2023-05-13 MED ORDER — ACETAMINOPHEN 650 MG RE SUPP: 650.0000 mg | RECTAL | Status: DC | PRN

## 2023-05-13 MED ORDER — DEXAMETHASONE SODIUM PHOSPHATE 10 MG/ML IJ SOLN
INTRAMUSCULAR | Status: DC | PRN
  Administered 2023-05-13: 5 mg via INTRAVENOUS

## 2023-05-13 MED ORDER — 0.9 % SODIUM CHLORIDE (POUR BTL) OPTIME
TOPICAL | Status: DC | PRN
  Administered 2023-05-13: 500 mL

## 2023-05-13 MED ORDER — OXYCODONE HCL 5 MG PO TABS
5.0000 mg | ORAL_TABLET | Freq: Once | ORAL | Status: AC | PRN
  Administered 2023-05-13: 5 mg via ORAL

## 2023-05-13 MED ORDER — CHLORHEXIDINE GLUCONATE 0.12 % MT SOLN
15.0000 mL | Freq: Once | OROMUCOSAL | Status: AC
  Administered 2023-05-13: 15 mL via OROMUCOSAL

## 2023-05-13 MED ORDER — ORAL CARE MOUTH RINSE: 15.0000 mL | Freq: Once | OROMUCOSAL | Status: AC

## 2023-05-13 MED ORDER — LIDOCAINE HCL (PF) 2 % IJ SOLN
INTRAMUSCULAR | Status: DC | PRN
  Administered 2023-05-13: 60 mg via INTRADERMAL

## 2023-05-13 MED ORDER — FENTANYL CITRATE (PF) 100 MCG/2ML IJ SOLN
INTRAMUSCULAR | Status: DC | PRN
  Administered 2023-05-13 (×2): 25 ug via INTRAVENOUS

## 2023-05-13 MED ORDER — ACETAMINOPHEN 500 MG PO TABS
1000.0000 mg | ORAL_TABLET | Freq: Once | ORAL | Status: AC
  Administered 2023-05-13: 1000 mg via ORAL
  Filled 2023-05-13: qty 2

## 2023-05-13 MED ORDER — CIPROFLOXACIN IN D5W 400 MG/200ML IV SOLN
400.0000 mg | INTRAVENOUS | Status: AC
  Administered 2023-05-13: 400 mg via INTRAVENOUS
  Filled 2023-05-13: qty 200

## 2023-05-13 MED ORDER — AMISULPRIDE (ANTIEMETIC) 5 MG/2ML IV SOLN: 10.0000 mg | Freq: Once | INTRAVENOUS | Status: DC | PRN

## 2023-05-13 MED ORDER — SODIUM CHLORIDE 0.9% FLUSH: 3.0000 mL | INTRAVENOUS | Status: DC | PRN

## 2023-05-13 MED ORDER — LACTATED RINGERS IV SOLN: INTRAVENOUS | Status: DC

## 2023-05-13 MED ORDER — LIDOCAINE HCL (PF) 2 % IJ SOLN
INTRAMUSCULAR | Status: AC
  Filled 2023-05-13: qty 5

## 2023-05-13 MED ORDER — OXYCODONE HCL 5 MG PO TABS
ORAL_TABLET | ORAL | Status: AC
  Filled 2023-05-13: qty 1

## 2023-05-13 MED ORDER — ONDANSETRON HCL 4 MG/2ML IJ SOLN
INTRAMUSCULAR | Status: AC
  Filled 2023-05-13: qty 2

## 2023-05-13 MED ORDER — PROPOFOL 10 MG/ML IV BOLUS
INTRAVENOUS | Status: DC | PRN
  Administered 2023-05-13: 100 mg via INTRAVENOUS

## 2023-05-13 MED ORDER — DEXAMETHASONE SODIUM PHOSPHATE 10 MG/ML IJ SOLN
INTRAMUSCULAR | Status: AC
  Filled 2023-05-13: qty 1

## 2023-05-13 MED ORDER — ACETAMINOPHEN 325 MG PO TABS: 650.0000 mg | ORAL_TABLET | ORAL | Status: DC | PRN

## 2023-05-13 MED ORDER — PHENYLEPHRINE HCL (PRESSORS) 10 MG/ML IV SOLN
INTRAVENOUS | Status: AC
  Filled 2023-05-13: qty 1

## 2023-05-13 MED ORDER — ONDANSETRON HCL 4 MG/2ML IJ SOLN
INTRAMUSCULAR | Status: DC | PRN
  Administered 2023-05-13: 4 mg via INTRAVENOUS

## 2023-05-13 MED ORDER — PHENYLEPHRINE HCL (PRESSORS) 10 MG/ML IV SOLN
INTRAVENOUS | Status: DC | PRN
  Administered 2023-05-13: 100 ug via INTRAVENOUS

## 2023-05-13 MED ORDER — FENTANYL CITRATE (PF) 100 MCG/2ML IJ SOLN
INTRAMUSCULAR | Status: AC
  Filled 2023-05-13: qty 2

## 2023-05-13 MED ORDER — PHENYLEPHRINE HCL-NACL 20-0.9 MG/250ML-% IV SOLN
INTRAVENOUS | Status: DC | PRN
  Administered 2023-05-13: 50 ug/min via INTRAVENOUS

## 2023-05-13 MED ORDER — OXYCODONE HCL 5 MG/5ML PO SOLN: 5.0000 mg | Freq: Once | ORAL | Status: AC | PRN

## 2023-05-13 MED ORDER — STERILE WATER FOR IRRIGATION IR SOLN
Status: DC | PRN
  Administered 2023-05-13: 3000 mL

## 2023-05-13 MED ORDER — MORPHINE SULFATE (PF) 2 MG/ML IV SOLN: 2.0000 mg | INTRAVENOUS | Status: DC | PRN

## 2023-05-13 MED ORDER — OXYCODONE HCL 5 MG PO TABS: 5.0000 mg | ORAL_TABLET | ORAL | Status: DC | PRN

## 2023-05-13 MED ORDER — SODIUM CHLORIDE 0.9 % IV SOLN: 250.0000 mL | INTRAVENOUS | Status: DC | PRN

## 2023-05-13 MED ORDER — FENTANYL CITRATE PF 50 MCG/ML IJ SOSY: 25.0000 ug | PREFILLED_SYRINGE | INTRAMUSCULAR | Status: DC | PRN

## 2023-05-13 MED ORDER — SODIUM CHLORIDE 0.9% FLUSH: 3.0000 mL | Freq: Two times a day (BID) | INTRAVENOUS | Status: DC

## 2023-05-13 MED ORDER — PROPOFOL 10 MG/ML IV BOLUS
INTRAVENOUS | Status: AC
  Filled 2023-05-13: qty 20

## 2023-05-13 SURGICAL SUPPLY — 21 items
BAG DRN RND TRDRP ANRFLXCHMBR (UROLOGICAL SUPPLIES)
BAG URINE DRAIN 2000ML AR STRL (UROLOGICAL SUPPLIES) IMPLANT
BAG URO CATCHER STRL LF (MISCELLANEOUS) ×2 IMPLANT
CATH FOLEY 3WAY 30CC 22FR (CATHETERS) IMPLANT
CATH URETL OPEN 5X70 (CATHETERS) IMPLANT
DRAPE FOOT SWITCH (DRAPES) ×2 IMPLANT
ELECT REM PT RETURN 15FT ADLT (MISCELLANEOUS) ×2 IMPLANT
GLOVE SURG SS PI 8.0 STRL IVOR (GLOVE) ×2 IMPLANT
GOWN STRL REUS W/ TWL XL LVL3 (GOWN DISPOSABLE) ×2 IMPLANT
GOWN STRL REUS W/TWL XL LVL3 (GOWN DISPOSABLE) ×1
HOLDER FOLEY CATH W/STRAP (MISCELLANEOUS) IMPLANT
KIT TURNOVER KIT A (KITS) IMPLANT
LOOP CUT BIPOLAR 24F LRG (ELECTROSURGICAL) IMPLANT
MANIFOLD NEPTUNE II (INSTRUMENTS) ×2 IMPLANT
PACK CYSTO (CUSTOM PROCEDURE TRAY) ×2 IMPLANT
SUT ETHILON 3 0 PS 1 (SUTURE) IMPLANT
SYR 30ML LL (SYRINGE) IMPLANT
SYR TOOMEY IRRIG 70ML (MISCELLANEOUS)
SYRINGE TOOMEY IRRIG 70ML (MISCELLANEOUS) IMPLANT
TUBING CONNECTING 10 (TUBING) ×2 IMPLANT
TUBING UROLOGY SET (TUBING) ×2 IMPLANT

## 2023-05-13 NOTE — Interval H&P Note (Signed)
History and Physical Interval Note:  05/13/2023 10:38 AM  Anthony Noble.  has presented today for surgery, with the diagnosis of BLADDER TUMOR.  The various methods of treatment have been discussed with the patient and family. After consideration of risks, benefits and other options for treatment, the patient has consented to  Procedure(s) with comments: CYSTOSCOPY WITH BIOPSY WIHT FULGURATION  BILATERAL RETROGRADE PYELOGRAM (Bilateral) - 1 HR FOR CASE as a surgical intervention.  The patient's history has been reviewed, patient examined, no change in status, stable for surgery.  I have reviewed the patient's chart and labs.  Questions were answered to the patient's satisfaction.     Bjorn Pippin

## 2023-05-13 NOTE — Discharge Instructions (Addendum)
CYSTOSCOPY HOME CARE INSTRUCTIONS  Activity: Rest for the remainder of the day.  Do not drive or operate equipment today.  You may resume normal activities in one to two days as instructed by your physician.   Meals: Drink plenty of liquids and eat light foods such as gelatin or soup this evening.  You may return to a normal meal plan tomorrow.  Return to Work: You may return to work in one to two days or as instructed by your physician.  Special Instructions / Symptoms: Call your physician if any of these symptoms occur:   -persistent or heavy bleeding  -bleeding which continues after first few urination  -large blood clots that are difficult to pass  -urine stream diminishes or stops completely  -fever equal to or higher than 101 degrees Farenheit.  -cloudy urine with a strong, foul odor  -severe pain   Patient Signature:  ________________________________________________________  Nurse's Signature:  ________________________________________________________  

## 2023-05-13 NOTE — Telephone Encounter (Signed)
REFERRAL REQUEST Telephone Note  Have you been seen at our office for this problem? Yes? (Advise that they may need an appointment with their PCP before a referral can be done)  Reason for Referral: Pt is having surgery today and echo showed aortic valve is worse and they are taking precautions, anesthesiologist advised to ask for referral to cardiology  Referral discussed with patient: yes  Best contact number of patient for referral team: [phone]    Has patient been seen by a specialist for this issue before: no  Patient provider preference for referral: no preference Patient location preference for referral: would like to go next door if possible   Patient notified that referrals can take up to a week or longer to process. If they haven't heard anything within a week they should call back and speak with the referral department.

## 2023-05-13 NOTE — Anesthesia Procedure Notes (Signed)
Arterial Line Insertion Start/End6/03/2023 9:35 AM, 05/13/2023 9:40 AM Performed by: Vanessa [place], CRNA, CRNA  Patient location: Pre-op. Preanesthetic checklist: patient identified, IV checked, site marked, risks and benefits discussed, surgical consent, monitors and equipment checked, pre-op evaluation, timeout performed and anesthesia consent Lidocaine 1% used for infiltration Right, radial was placed Catheter size: 20 G Hand hygiene performed  and maximum sterile barriers used   Attempts: 1 Procedure performed without using ultrasound guided technique. Following insertion, dressing applied. Post procedure assessment: normal and unchanged  Patient tolerated the procedure well with no immediate complications.

## 2023-05-13 NOTE — Transfer of Care (Signed)
Immediate Anesthesia Transfer of Care Note  Patient: Anthony Noble.  Procedure(s) Performed: CYSTOSCOPY,BLADDER  BIOPSY WITH FULGURATION, BILATERAL RETROGRADE PYELOGRAM (Bilateral: Urethra)  Patient Location: PACU  Anesthesia Type:General  Level of Consciousness: awake, alert , oriented, and patient cooperative  Airway & Oxygen Therapy: Patient Spontanous Breathing and Patient connected to face mask oxygen  Post-op Assessment: Report given to RN  Post vital signs: Reviewed and stable  Last Vitals:  Vitals Value Taken Time  BP 161/75 05/13/23 1138  Temp    Pulse 62 05/13/23 1140  Resp 12 05/13/23 1140  SpO2 100 % 05/13/23 1140  Vitals shown include unvalidated device data.  Last Pain:  Vitals:   05/13/23 0908  TempSrc:   PainSc: 2       Patients Stated Pain Goal: 2 (05/13/23 0908)  Complications: No notable events documented.

## 2023-05-13 NOTE — Op Note (Signed)
Procedure: 1.  Cystoscopy with bladder biopsy and fulguration of 1.5 cm lesion. 2.  Bilateral retrograde pyelograms with interpretation. 3.  Application of fluoroscopy.  Preop diagnosis: History of bladder cancer with bladder mucosal lesion.  Postop diagnosis: Same.  Surgeon: Dr. Bjorn Pippin.  Anesthesia: General.  Specimen: Bladder biopsies from left lateral wall.  Drains: None.  Findings: Normal bilateral retrograde pyelograms.  EBL: None.  Complications: None.  Indications: The patient is an 82 year old male with a history of recurrent urothelial carcinoma who recently had surveillance cystoscopy and was noted to have some very low papillary appearing lesions surrounding prior resection scar on the left lateral wall it was felt that biopsy and fulguration were indicated.  He is also not had recent upper tract evaluation so bilateral retrograde pyelogram grams were indicated as well.  Procedure: He was taken the operating room was given antibiotic.  A general anesthetic was induced.  He was placed in lithotomy position and fitted with PAS hose.  Perineum and genitalia were prepped with Betadine solution he was draped in usual sterile fashion.  Cystoscopy was performed using a 21 [place] scope and 30 degree lens.  Examination revealed a normal urethra.  The external sphincter was intact.  The prostatic urethra short lateral lobe hyperplasia with some coaptation.  Examination of bladder revealed mild trabeculation.  There were resection scars on the left lateral wall and one had some erythematous mucosa surrounding the margins but it did have quite a papillary appearance it did under flexible cystoscopy in the office.  Additionally there was resection scar over the right intramural ureter but no other lesions were identified.  The ureteral orifices were unremarkable.  A cup biopsy forceps was then used to take 3 biopsies from the area of concern around the scar and the biopsy sites were  then generously fulgurated with Bugbee electrode along with the surrounding mucosa for an area of approximately 1.5 cm.  Once hemostasis had been achieved, retrograde pyelograms were performed.  The retrograde pyelograms were performed using a 5 [place] open-ended catheter and Omnipaque bilaterally.  The left retrograde pyelogram demonstrated normal ureter and intrarenal collecting system without filling defects.  The right retrograde pyelogram demonstrated a normal ureter and intrarenal collecting system without filling defects.  Final inspection was performed and there was no evidence of bladder wall perforation or active bleeding.  The bladder was drained and the cystoscope was removed.  He was taken down from lithotomy position, his anesthetic was reversed and he was moved to recovery room in stable condition.

## 2023-05-14 ENCOUNTER — Encounter (HOSPITAL_COMMUNITY): Payer: Self-pay | Admitting: Urology

## 2023-05-14 DIAGNOSIS — H401213 Low-tension glaucoma, right eye, severe stage: Secondary | ICD-10-CM | POA: Diagnosis not present

## 2023-05-14 DIAGNOSIS — H401221 Low-tension glaucoma, left eye, mild stage: Secondary | ICD-10-CM | POA: Diagnosis not present

## 2023-05-14 DIAGNOSIS — E119 Type 2 diabetes mellitus without complications: Secondary | ICD-10-CM | POA: Diagnosis not present

## 2023-05-14 DIAGNOSIS — H25813 Combined forms of age-related cataract, bilateral: Secondary | ICD-10-CM | POA: Diagnosis not present

## 2023-05-14 LAB — SURGICAL PATHOLOGY

## 2023-05-14 NOTE — Telephone Encounter (Signed)
Wife made aware in result note.

## 2023-05-14 NOTE — Telephone Encounter (Signed)
Yes I agree, I saw the echocardiogram today myself and I am going to have them place a referral to cardiology for him.  He would like to see Dr. Antoine Poche, see result note for echocardiogram

## 2023-05-14 NOTE — Anesthesia Postprocedure Evaluation (Signed)
Anesthesia Post Note  Patient: Anthony Noble [facility].  Procedure(s) Performed: CYSTOSCOPY,BLADDER  BIOPSY WITH FULGURATION, BILATERAL RETROGRADE PYELOGRAM (Bilateral: Urethra)     Patient location during evaluation: PACU Anesthesia Type: General Level of consciousness: awake and alert Pain management: pain level controlled Vital Signs Assessment: post-procedure vital signs reviewed and stable Respiratory status: spontaneous breathing, nonlabored ventilation, respiratory function stable and patient connected to nasal cannula oxygen Cardiovascular status: blood pressure returned to baseline and stable Postop Assessment: no apparent nausea or vomiting Anesthetic complications: no   No notable events documented.  Last Vitals:  Vitals:   05/13/23 1220 05/13/23 1237  BP:  (!) 189/80  Pulse:    Resp: 19 17  Temp:  36.6 C  SpO2: 99% 100%    Last Pain:  Vitals:   05/13/23 1237  TempSrc:   PainSc: 3                  Tenishia Ekman S

## 2023-05-15 ENCOUNTER — Other Ambulatory Visit: Payer: Self-pay

## 2023-05-15 DIAGNOSIS — I350 Nonrheumatic aortic (valve) stenosis: Secondary | ICD-10-CM

## 2023-05-15 DIAGNOSIS — I700 Atherosclerosis of aorta: Secondary | ICD-10-CM

## 2023-05-22 ENCOUNTER — Ambulatory Visit: Payer: PPO | Admitting: Family Medicine

## 2023-05-26 DIAGNOSIS — D090 Carcinoma in situ of bladder: Secondary | ICD-10-CM | POA: Diagnosis not present

## 2023-05-26 DIAGNOSIS — Z8551 Personal history of malignant neoplasm of bladder: Secondary | ICD-10-CM | POA: Diagnosis not present

## 2023-06-10 DIAGNOSIS — M79675 Pain in left toe(s): Secondary | ICD-10-CM | POA: Diagnosis not present

## 2023-06-10 DIAGNOSIS — L03032 Cellulitis of left toe: Secondary | ICD-10-CM | POA: Diagnosis not present

## 2023-06-17 NOTE — Progress Notes (Unsigned)
Cardiology Office Note:   Date:  06/17/2023  NAME:  Anthony Noble [facility].    MRN: [id number] DOB:  1941-01-08   PCP:  Dettinger, Elige Radon, MD  Cardiologist:  None  Electrophysiologist:  None   Referring MD: Dettinger, Elige Radon, MD   No chief complaint on file.   History of Present Illness:   Anthony Noble. is a 82 y.o. male with a hx of carotid artery disease, aortic stenosis, HLD who is being seen today for the evaluation of aortic stenosis at the request of Dettinger, Elige Radon, MD.  ***  TSH 7.67  Problem List Aortic stenosis -severe AS 05/2023 Carotid artery disease  -R ICA 40-59% 3. HLD -T chol 189, HDL 62, LDL 112, TG 84  Past Medical History: Past Medical History:  Diagnosis Date   Anemia    Bilateral carotid artery stenosis without cerebral infarction    vascular--- dr Chestine Spore;   hx CVA;  last duplex  in epic 02-11-2023 left ICA 1-39%,  righrt ICA 40-59%,  bilateral ECA >50%   , pt to maintain on ASA 81mg  and statin   BPH (benign prostatic hypertrophy) with urinary obstruction    urologist--- dr Annabell Howells   Cancer [facility])    Coronary artery disease    Diverticulosis of colon    Glaucoma    Guillain Barr syndrome [facility]) 2001   Heart murmur March /  April 2016   History of bladder cancer    first dx 2004--  tx with chemo bladder instillation   History of bladder cancer 2004   s/p TURBT  and BCG treatment's   History of colon polyps    History of pleural effusion 2001   s/p  VATS w/ drainage   History of vertebral fracture 2001   2001--   T12   Hyperlipidemia    Hypothyroidism    followed by pcp   Pneumonia    Severe aortic valve stenosis    (04-29-2023  dx by anesthesia Dr Reece Agar. [facility] )  (pt does not have a cardiologsit)   last echo in epic 06-29-2020;    moderate regurg,  moderate calcification, MG , valva area 0.85 cm^2   Tinnitus    Varicose veins of both lower extremities     Past Surgical History: Past Surgical History:  Procedure  Laterality Date   CYSTO/  BILATERAL RETROGRADE PYLOGRAM/  RESECTION BLADDER TUMOR  07/30/2011   @[facility]   by dr Annabell Howells   CYSTOSCOPY WITH BIOPSY N/A 01/12/2015   Procedure: CYSTOSCOPY WITH BLADDER  BIOPSY, FULGERATION;  Surgeon: Anner Crete, MD;  Location[facility];  Service: Urology;  Laterality: N/A;   CYSTOSCOPY WITH BIOPSY Bilateral 05/13/2023   Procedure: CYSTOSCOPY,BLADDER  BIOPSY WITH FULGURATION, BILATERAL RETROGRADE PYELOGRAM;  Surgeon: Bjorn Pippin, MD;  Location: [facility];  Service: Urology;  Laterality: Bilateral;  1 HR FOR CASE   ORIF RIGHT DISTAL RADIUS FX  12/31/2007   @[facility]   by dr c. blackman   TONSILLECTOMY     child   TRANSURETHRAL RESECTION OF BLADDER TUMOR WITH MITOMYCIN-C  06/07/2003   @[facility]  by dr Annabell Howells   VIDEO ASSISTED THORACOSCOPY (VATS)/EMPYEMA Right 05/01/2000   @[facility]   by dr Dorris Fetch;  and Drainage of effusion    Current Medications: No outpatient medications have been marked as taking for the 06/19/23 encounter (Appointment) with Sande Rives, MD.     Allergies:    Bcg live, Septra [bactrim], Sulfa antibiotics, Sulfamethoxazole-trimethoprim, and Pertussis vaccines  Social History: Social History   Socioeconomic History   Marital status: Married    Spouse name: Not on file   Number of children: 2   Years of education: Not on file   Highest education level: Not on file  Occupational History   Occupation: retired    Comment: self employed-printing  Tobacco Use   Smoking status: Former    Packs/day: 1.00    Years: 20.00    Additional pack years: 0.00    Total pack years: 20.00    Types: Cigarettes    Quit date: 1983    Years since quitting: 41.5    Passive exposure: Never   Smokeless tobacco: Never  Vaping Use   Vaping Use: Never used  Substance and Sexual Activity   Alcohol use: Yes    Alcohol/week: 2.0 standard drinks of alcohol    Types: 1 Shots of liquor, 1 Standard drinks or equivalent per week   Drug use: No     Comment: Hemp gummies   Sexual activity: Not on file  Other Topics Concern   Not on file  Social History Narrative   Lives at home with wife and daughter.      Social Determinants of Health   Financial Resource Strain: Low Risk  (05/02/2020)   Overall Financial Resource Strain (CARDIA)    Difficulty of Paying Living Expenses: Not hard at all  Food Insecurity: No Food Insecurity (05/02/2020)   Hunger Vital Sign    Worried About Running Out of Food in the Last Year: Never true    Ran Out of Food in the Last Year: Never true  Transportation Needs: No Transportation Needs (05/02/2020)   PRAPARE - Administrator, Civil Service (Medical): No    Lack of Transportation (Non-Medical): No  Physical Activity: Sufficiently Active (05/02/2020)   Exercise Vital Sign    Days of Exercise per Week: 5 days    Minutes of Exercise per Session: 60 min  Stress: No Stress Concern Present (05/02/2020)   [name] of Occupational Health - Occupational Stress Questionnaire    Feeling of Stress : Not at all  Social Connections: Socially Integrated (05/02/2020)   Social Connection and Isolation Panel [NHANES]    Frequency of Communication with Friends and Family: More than three times a week    Frequency of Social Gatherings with Friends and Family: More than three times a week    Attends Religious Services: More than 4 times per year    Active Member of [employer] or Organizations: Yes    Attends Engineer, structural: More than 4 times per year    Marital Status: Married     Family History: The patient's family history includes Bipolar disorder in his daughter; COPD in his father; Cancer in his maternal grandmother and mother; Healthy in his daughter and daughter; Hyperlipidemia in his father; Hypertension in his sister; Nephrolithiasis in his mother; Other in his mother; Varicose Veins in his mother. There is no history of Colon cancer.  ROS:   All other ROS reviewed and negative.  Pertinent positives noted in the HPI.     EKGs/Labs/Other Studies Reviewed:   The following studies were personally reviewed by me today:  EKG:  EKG is *** ordered today.        TTE 05/12/2023  1. Left ventricular ejection fraction, by estimation, is 60 to 65%. The  left ventricle has normal function. The left ventricle has no regional  wall motion abnormalities. Left ventricular diastolic parameters  are  consistent with Grade I diastolic  dysfunction (impaired relaxation).   2. Right ventricular systolic function is normal. The right ventricular  size is normal. Tricuspid regurgitation signal is inadequate for assessing  PA pressure.   3. Left atrial size was mildly dilated.   4. The mitral valve is degenerative. Trivial mitral valve regurgitation.   5. The aortic valve is calcified. There is severe calcifcation of the  aortic valve. Aortic valve regurgitation is mild. Severe aortic valve  stenosis. Aortic valve area, by VTI measures 0.70 cm. Aortic valve mean  gradient measures 53.0 mmHg. Aortic  valve Vmax measures 4.84 m/s.   6. The inferior vena cava is normal in size with greater than 50%  respiratory variability, suggesting right atrial pressure of 3 mmHg.   Recent Labs: 05/01/2023: ALT 19; BUN 17; Creatinine, Ser 1.10; Hemoglobin 12.8; Platelets 196; Potassium 4.2; Sodium 141; TSH 7.670   Recent Lipid Panel    Component Value Date/Time   CHOL 189 05/01/2023 1002   TRIG 84 05/01/2023 1002   TRIG 57 04/15/2016 1038   HDL 62 05/01/2023 1002   HDL 72 04/15/2016 1038   CHOLHDL 3.0 05/01/2023 1002   CHOLHDL 3.7 03/24/2013 0832   VLDL 16 03/24/2013 0832   LDLCALC 112 (H) 05/01/2023 1002    Physical Exam:   VS:  There were no vitals taken for this visit.   Wt Readings from Last 3 Encounters:  05/13/23 176 lb (79.8 kg)  05/08/23 176 lb (79.8 kg)  05/02/23 178 lb (80.7 kg)    General: Well nourished, well developed, in no acute distress Head: Atraumatic, normal size   Eyes: PEERLA, EOMI  Neck: Supple, no JVD Endocrine: No thryomegaly Cardiac: Normal S1, S2; RRR; no murmurs, rubs, or gallops Lungs: Clear to auscultation bilaterally, no wheezing, rhonchi or rales  Abd: Soft, nontender, no hepatomegaly  Ext: No edema, pulses 2+ Musculoskeletal: No deformities, BUE and BLE strength normal and equal Skin: Warm and dry, no rashes   Neuro: Alert and oriented to person, place, time, and situation, CNII-XII grossly intact, no focal deficits  Psych: Normal mood and affect   ASSESSMENT:   Celvin Taney. is a 82 y.o. male who presents for the following: No diagnosis found.  PLAN:   There are no diagnoses linked to this encounter.  {Are you ordering a CV Procedure (e.g. stress test, cath, DCCV, TEE, etc)?   Press F2        :[id number]}  Disposition: No follow-ups on file.  Medication Adjustments/Labs and Tests Ordered: Current medicines are reviewed at length with the patient today.  Concerns regarding medicines are outlined above.  No orders of the defined types were placed in this encounter.  No orders of the defined types were placed in this encounter.  There are no Patient Instructions on file for this visit.   Time Spent with Patient: I have spent a total of *** minutes with patient reviewing hospital notes, telemetry, EKGs, labs and examining the patient as well as establishing an assessment and plan that was discussed with the patient.  > 50% of time was spent in direct patient care.  Signed, Lenna Gilford. Flora Lipps, MD, [facility]  [facility]  [address] [place], [place] [address] [phone]  06/17/2023 6:33 PM

## 2023-06-19 ENCOUNTER — Ambulatory Visit: Payer: PPO | Admitting: Cardiovascular Disease

## 2023-06-19 ENCOUNTER — Encounter: Payer: Self-pay | Admitting: Cardiovascular Disease

## 2023-06-19 ENCOUNTER — Telehealth: Payer: Self-pay

## 2023-06-19 VITALS — BP 149/75 | HR 62 | Ht 69.0 in | Wt 176.8 lb

## 2023-06-19 DIAGNOSIS — I350 Nonrheumatic aortic (valve) stenosis: Secondary | ICD-10-CM | POA: Diagnosis not present

## 2023-06-19 DIAGNOSIS — E782 Mixed hyperlipidemia: Secondary | ICD-10-CM

## 2023-06-19 DIAGNOSIS — I6521 Occlusion and stenosis of right carotid artery: Secondary | ICD-10-CM

## 2023-06-19 NOTE — Telephone Encounter (Signed)
Per Dr. Flora Lipps, called to schedule TAVR consult.  Left message to call back.

## 2023-06-19 NOTE — Patient Instructions (Signed)
Medication Instructions:  Your physician recommends that you continue on your current medications as directed. Please refer to the Current Medication list given to you today.  *If you need a refill on your cardiac medications before your next appointment, please call your pharmacy*    Follow-Up: At [facility], you and your health needs are our priority.  As part of our continuing mission to provide you with exceptional heart care, we have created designated Provider Care Teams.  These Care Teams include your primary Cardiologist (physician) and Advanced Practice Providers (APPs -  Physician Assistants and Nurse Practitioners) who all work together to provide you with the care you need, when you need it.  We recommend signing up for the patient portal called "MyChart".  Sign up information is provided on this After Visit Summary.  MyChart is used to connect with patients for Virtual Visits (Telemedicine).  Patients are able to view lab/test results, encounter notes, upcoming appointments, etc.  Non-urgent messages can be sent to your provider as well.   To learn more about what you can do with MyChart, go to [email or web address].    Your next appointment:   6 month(s)  Provider:   Dr. Flora Lipps

## 2023-06-20 ENCOUNTER — Telehealth: Payer: Self-pay | Admitting: Family Medicine

## 2023-06-20 DIAGNOSIS — Z0000 Encounter for general adult medical examination without abnormal findings: Secondary | ICD-10-CM

## 2023-06-20 NOTE — Telephone Encounter (Signed)
Pt scheduled for TAVR consult on 06/30/2023 with Dr Clifton James.

## 2023-06-20 NOTE — Telephone Encounter (Signed)
CPE scheduled 11/21  Future lab orders placed  Wife made aware.

## 2023-06-20 NOTE — Telephone Encounter (Signed)
I recommend that he come in sometime this year for physical.  If he wants to push it back a couple months and that is fine

## 2023-06-26 ENCOUNTER — Encounter: Payer: PPO | Admitting: Family Medicine

## 2023-06-30 ENCOUNTER — Ambulatory Visit: Payer: PPO | Attending: Cardiovascular Disease | Admitting: Cardiovascular Disease

## 2023-06-30 ENCOUNTER — Encounter: Payer: Self-pay | Admitting: Cardiovascular Disease

## 2023-06-30 VITALS — BP 124/64 | HR 69 | Ht 69.0 in | Wt 178.2 lb

## 2023-06-30 DIAGNOSIS — I350 Nonrheumatic aortic (valve) stenosis: Secondary | ICD-10-CM

## 2023-06-30 NOTE — Progress Notes (Signed)
[facility] Consult Note  Chief Complaint  Patient presents with   New Patient (Initial Visit)    Severe aortic stenosis     History of Present Illness: 82 yo Anthony Noble with history of anemia, carotid artery disease, hyperlipidemia, hypothyroidism, bladder cancer and severe aortic stenosis who is here today as a new consult, referred by Dr. Flora Lipps, for further discussion regarding his aortic stenosis and possible TAVR. Echo June 2024 with LVEF=60-65%. Mild AI. Severe aortic stenosis with mean gradient 53 mmHg, AVA 0.7 cm2, DI 0.22. He was seen on 06/19/23 by Dr. Flora Lipps and reported feeling well overall but given very severe AS, Dr. Flora Lipps felt that we should proceed with planning for TAVR. He is not known to have CAD as stated in the chart. No prior cardiac cath.   He tells me today that he has no symptoms. He denies chest pain, dyspnea, LE edema, dizziness, near syncope or syncope. He is very active. He walked three miles yesterday with his wife and does this 5 days per week. He plays golf three days per week. He bowled 11 games this weekend. He lives in [place], [place] with his wife. She is with him today. He still works part time in Film/video editor. He has partial dentures. He goes to the dentist every six months. No active issues.   Primary Care Physician: Dettinger, Elige Radon, MD Primary Cardiologist: O'Neal Referring Cardiologist: Flora Lipps  Past Medical History:  Diagnosis Date   Anemia    Bilateral carotid artery stenosis without cerebral infarction    vascular--- dr Chestine Spore;   hx CVA;  last duplex  in epic 02-11-2023 left ICA 1-39%,  righrt ICA 40-59%,  bilateral ECA >50%   , pt to maintain on ASA 81mg  and statin   BPH (benign prostatic hypertrophy) with urinary obstruction    urologist--- dr Annabell Howells   Cancer [facility])    Coronary artery disease    Diverticulosis of colon    Glaucoma    Guillain Barr syndrome [facility]) 2001   Heart murmur March /  April 2016   History of bladder cancer     first dx 2004--  tx with chemo bladder instillation   History of bladder cancer 2004   s/p TURBT  and BCG treatment's   History of colon polyps    History of pleural effusion 2001   s/p  VATS w/ drainage   History of vertebral fracture 2001   2001--   T12   Hyperlipidemia    Hypothyroidism    followed by pcp   Pneumonia    Severe aortic valve stenosis    (04-29-2023  dx by anesthesia Dr Reece Agar. [facility] )  (pt does not have a cardiologsit)   last echo in epic 06-29-2020;    moderate regurg,  moderate calcification, MG , valva area 0.85 cm^2   Tinnitus    Varicose veins of both lower extremities     Past Surgical History:  Procedure Laterality Date   CYSTO/  BILATERAL RETROGRADE PYLOGRAM/  RESECTION BLADDER TUMOR  07/30/2011   @[facility]   by dr Annabell Howells   CYSTOSCOPY WITH BIOPSY N/A 01/12/2015   Procedure: CYSTOSCOPY WITH BLADDER  BIOPSY, FULGERATION;  Surgeon: Anner Crete, MD;  Location: [facility];  Service: Urology;  Laterality: N/A;   CYSTOSCOPY WITH BIOPSY Bilateral 05/13/2023   Procedure: CYSTOSCOPY,BLADDER  BIOPSY WITH FULGURATION, BILATERAL RETROGRADE PYELOGRAM;  Surgeon: Bjorn Pippin, MD;  Location: [facility];  Service: Urology;  Laterality: Bilateral;  1 HR FOR  CASE   ORIF RIGHT DISTAL RADIUS FX  12/31/2007   @[facility]   by dr c. blackman   TONSILLECTOMY     child   TRANSURETHRAL RESECTION OF BLADDER TUMOR WITH MITOMYCIN-C  06/07/2003   @[facility]  by dr Annabell Howells   VIDEO ASSISTED THORACOSCOPY (VATS)/EMPYEMA Right 05/01/2000   @[facility]   by dr Dorris Fetch;  and Drainage of effusion    Current Outpatient Medications  Medication Sig Dispense Refill   atorvastatin (LIPITOR) 20 MG tablet Take 1 tablet (20 mg total) by mouth daily. 90 tablet 2   brimonidine (ALPHAGAN) 0.2 % ophthalmic solution Place 1 drop into the right eye 3 (three) times daily.     calcium elemental as carbonate (TUMS ULTRA 1000) 400 MG chewable tablet Chew 1,000 mg by mouth at bedtime.     Cholecalciferol (VITAMIN D)  125 MCG (5000 UT) CAPS Take 5,000 Units by mouth daily.     Coenzyme Q10 (COQ10) 100 MG CAPS Take 100-200 mg by mouth daily.     CRANBERRY PO Take 650 mg by mouth daily.     diphenhydrAMINE (BENADRYL) 25 mg capsule Take 25 mg by mouth daily as needed (Allergies).     dorzolamide-timolol (COSOPT) 22.3-6.8 MG/ML ophthalmic solution Place 1 drop into the right eye 2 (two) times daily.     hyoscyamine (LEVSIN SL) 0.125 MG SL tablet DISSOLVE 1 TABLET IN MOUTH EVERY 8 HOURS AS NEEDED (Patient taking differently: Take 0.125 mg by mouth 3 (three) times daily as needed (Stomach upset).) 120 tablet 2   levothyroxine (SYNTHROID) 100 MCG tablet Take 1 tablet (100 mcg total) by mouth daily before breakfast. 90 tablet 1   loratadine (CLARITIN) 10 MG tablet Take 10 mg by mouth daily.     Magnesium 400 MG TABS Take 400 mg by mouth daily.     Multiple Vitamin (MULTIVITAMIN WITH MINERALS) TABS tablet Take 1 tablet by mouth daily. Centrum silver     Multiple Vitamins-Minerals (HAIR SKIN NAILS PO) Take 1 tablet by mouth daily.     Netarsudil-Latanoprost (ROCKLATAN) 0.02-0.005 % SOLN Place 1 drop into both eyes at bedtime.     NON FORMULARY Take 10 mg by mouth at bedtime. CBD Gummy hemp isolate     NON FORMULARY Take 2 tablets by mouth 3 (three) times daily. Flavonoid     Omega-3 Fatty Acids (FISH OIL PO) Take 1,470 mg by mouth daily.     sodium chloride (OCEAN) 0.65 % SOLN nasal spray Place 1 spray into both nostrils as needed for congestion.     No current facility-administered medications for this visit.    Allergies  Allergen Reactions   Bcg Live Other (See Comments)    Treatment pt had- shakes   Septra [Bactrim] Other (See Comments)    Muscle weakness, numbness   Sulfa Antibiotics Other (See Comments)    Severe weakness   Sulfamethoxazole-Trimethoprim Other (See Comments)    Muscle weakness, numbness     Pertussis Vaccines     History of Gillian barrie     Social History   Socioeconomic  History   Marital status: Married    Spouse name: Not on file   Number of children: 2   Years of education: Not on file   Highest education level: Not on file  Occupational History   Occupation: retired    Comment: self employed-printing  Tobacco Use   Smoking status: Former    Current packs/day: 0.00    Average packs/day: 1 pack/day for 20.0 years (20.0 ttl pk-yrs)  Types: Cigarettes    Start date: 1963    Quit date: 1983    Years since quitting: 41.5    Passive exposure: Never   Smokeless tobacco: Never  Vaping Use   Vaping status: Never Used  Substance and Sexual Activity   Alcohol use: Yes    Alcohol/week: 2.0 standard drinks of alcohol    Types: 1 Shots of liquor, 1 Standard drinks or equivalent per week   Drug use: No    Comment: Hemp gummies   Sexual activity: Not on file  Other Topics Concern   Not on file  Social History Narrative   Lives at home with wife and daughter.      Social Determinants of Health   Financial Resource Strain: Low Risk  (05/02/2020)   Overall Financial Resource Strain (CARDIA)    Difficulty of Paying Living Expenses: Not hard at all  Food Insecurity: No Food Insecurity (05/02/2020)   Hunger Vital Sign    Worried About Running Out of Food in the Last Year: Never true    Ran Out of Food in the Last Year: Never true  Transportation Needs: No Transportation Needs (05/02/2020)   PRAPARE - Administrator, Civil Service (Medical): No    Lack of Transportation (Non-Medical): No  Physical Activity: Sufficiently Active (05/02/2020)   Exercise Vital Sign    Days of Exercise per Week: 5 days    Minutes of Exercise per Session: 60 min  Stress: No Stress Concern Present (05/02/2020)   [name] of Occupational Health - Occupational Stress Questionnaire    Feeling of Stress : Not at all  Social Connections: Socially Integrated (05/02/2020)   Social Connection and Isolation Panel [NHANES]    Frequency of Communication with Friends  and Family: More than three times a week    Frequency of Social Gatherings with Friends and Family: More than three times a week    Attends Religious Services: More than 4 times per year    Active Member of [employer] or Organizations: Yes    Attends Engineer, structural: More than 4 times per year    Marital Status: Married  Catering manager Violence: Not At Risk (05/02/2020)   Humiliation, Afraid, Rape, and Kick questionnaire    Fear of Current or Ex-Partner: No    Emotionally Abused: No    Physically Abused: No    Sexually Abused: No    Family History  Problem Relation Age of Onset   Other Mother        varicose veins   Cancer Mother        Ovarian   Varicose Veins Mother    Nephrolithiasis Mother    COPD Father    Hyperlipidemia Father    Cancer Maternal Grandmother    Bipolar disorder Daughter    Healthy Daughter    Hypertension Sister    Healthy Daughter    Colon cancer Neg Hx     Review of Systems:  As stated in the HPI and otherwise negative.   BP 124/64   Pulse 69   Ht 5\' 9"  (1.753 m)   Wt 80.8 kg   SpO2 98%   BMI 26.32 kg/m   Physical Examination: General: Well developed, well nourished, NAD  HEENT: OP clear, mucus membranes moist  SKIN: warm, dry. No rashes. Neuro: No focal deficits  Musculoskeletal: Muscle strength 5/5 all ext  Psychiatric: Mood and affect normal  Neck: No JVD, no carotid bruits, no thyromegaly, no lymphadenopathy.  Lungs:Clear  bilaterally, no wheezes, rhonci, crackles Cardiovascular: Regular rate and rhythm. Loud, harsh, late peaking systolic murmur.  Abdomen:Soft. Bowel sounds present. Non-tender.  Extremities: No lower extremity edema. Pulses are 2 + in the bilateral DP/PT.  EKG:  EKG is ordered today. The ekg ordered today demonstrates  EKG Interpretation Date/Time:  Monday June 30 2023 13:30:42 EDT Ventricular Rate:  69 PR Interval:  152 QRS Duration:  98 QT Interval:  428 QTC Calculation: 458 R Axis:   11  Text  Interpretation: Normal sinus rhythm Incomplete right bundle branch block Nonspecific ST abnormality When compared with ECG of 30-Jul-2011 09:10, T wave amplitude has decreased in Lateral leads Confirmed by Verne Carrow [phone]) on 06/30/2023 2:03:40 PM    Echo 05/12/23:  1. Left ventricular ejection fraction, by estimation, is 60 to 65%. The  left ventricle has normal function. The left ventricle has no regional  wall motion abnormalities. Left ventricular diastolic parameters are  consistent with Grade I diastolic  dysfunction (impaired relaxation).   2. Right ventricular systolic function is normal. The right ventricular  size is normal. Tricuspid regurgitation signal is inadequate for assessing  PA pressure.   3. Left atrial size was mildly dilated.   4. The mitral valve is degenerative. Trivial mitral valve regurgitation.   5. The aortic valve is calcified. There is severe calcifcation of the  aortic valve. Aortic valve regurgitation is mild. Severe aortic valve  stenosis. Aortic valve area, by VTI measures 0.70 cm. Aortic valve mean  gradient measures 53.0 mmHg. Aortic  valve Vmax measures 4.84 m/s.   6. The inferior vena cava is normal in size with greater than 50%  respiratory variability, suggesting right atrial pressure of 3 mmHg.   FINDINGS   Left Ventricle: Left ventricular ejection fraction, by estimation, is 60  to 65%. The left ventricle has normal function. The left ventricle has no  regional wall motion abnormalities. The left ventricular internal cavity  size was normal in size. There is   no left ventricular hypertrophy. Left ventricular diastolic parameters  are consistent with Grade I diastolic dysfunction (impaired relaxation).   Right Ventricle: The right ventricular size is normal. No increase in  right ventricular wall thickness. Right ventricular systolic function is  normal. Tricuspid regurgitation signal is inadequate for assessing PA  pressure.    Left Atrium: Left atrial size was mildly dilated.   Right Atrium: Right atrial size was normal in size.   Pericardium: There is no evidence of pericardial effusion.   Mitral Valve: The mitral valve is degenerative in appearance. There is  mild thickening of the mitral valve leaflet(s). There is mild  calcification of the mitral valve leaflet(s). Mild mitral annular  calcification. Trivial mitral valve regurgitation.   Tricuspid Valve: The tricuspid valve is normal in structure. Tricuspid  valve regurgitation is trivial.   Aortic Valve: The aortic valve is calcified. There is severe calcifcation  of the aortic valve. Aortic valve regurgitation is mild. Aortic  regurgitation PHT measures 684 msec. Severe aortic stenosis is present.  Aortic valve mean gradient measures 53.0  mmHg. Aortic valve peak gradient measures 93.7 mmHg. Aortic valve area, by  VTI measures 0.70 cm.   Pulmonic Valve: The pulmonic valve was normal in structure. Pulmonic valve  regurgitation is trivial.   Aorta: The aortic root and ascending aorta are structurally normal, with  no evidence of dilitation.   Venous: The inferior vena cava is normal in size with greater than 50%  respiratory variability, suggesting right atrial  pressure of 3 mmHg.   IAS/Shunts: No atrial level shunt detected by color flow Doppler.     LEFT VENTRICLE  PLAX 2D  LVIDd:         3.90 cm   Diastology  LVIDs:         2.50 cm   LV e' medial:    5.66 cm/s  LV PW:         1.00 cm   LV E/e' medial:  12.5  LV IVS:        1.00 cm   LV e' lateral:   6.20 cm/s  LVOT diam:     2.00 cm   LV E/e' lateral: 11.5  LV SV:         78  LV SV Index:   40  LVOT Area:     3.14 cm     RIGHT VENTRICLE  RV S prime:     13.60 cm/s  TAPSE (M-mode): 2.4 cm   LEFT ATRIUM             Index        RIGHT ATRIUM           Index  LA diam:        3.50 cm 1.79 cm/m   RA Area:     15.30 cm  LA Vol (A2C):   75.6 ml 38.64 ml/m  RA Volume:   39.00 ml   19.93 ml/m  LA Vol (A4C):   77.6 ml 39.66 ml/m  LA Biplane Vol: 79.9 ml 40.83 ml/m   AORTIC VALVE  AV Area (Vmax):    0.62 cm  AV Area (Vmean):   0.70 cm  AV Area (VTI):     0.70 cm  AV Vmax:           484.00 cm/s  AV Vmean:          336.000 cm/s  AV VTI:            1.120 m  AV Peak Grad:      93.7 mmHg  AV Mean Grad:      53.0 mmHg  LVOT Vmax:         96.00 cm/s  LVOT Vmean:        75.300 cm/s  LVOT VTI:          0.248 m  LVOT/AV VTI ratio: 0.22  AI PHT:            684 msec    AORTA  Ao Root diam: 3.50 cm  Ao Asc diam:  3.60 cm   MITRAL VALVE  MV Area (PHT): 2.36 cm     SHUNTS  MV Decel Time: 321 msec     Systemic VTI:  0.25 m  MV E velocity: 71.00 cm/s   Systemic Diam: 2.00 cm  MV A velocity: 121.00 cm/s  MV E/A ratio:  0.59    Recent Labs: 05/01/2023: ALT 19; BUN 17; Creatinine, Ser 1.10; Hemoglobin 12.8; Platelets 196; Potassium 4.2; Sodium 141; TSH 7.670    Wt Readings from Last 3 Encounters:  06/30/23 80.8 kg  06/19/23 80.2 kg  05/13/23 79.8 kg    Assessment and Plan:   1. Severe Aortic Valve Stenosis: He has severe aortic valve stenosis. He is asymptomatic at this time with NYHA class 1 symptoms. I have personally reviewed the echo images. The aortic valve is thickened and calcified with limited leaflet mobility. I think he would benefit from AVR but he wishes to delay  further workup for now. Given advanced age, he is not a good candidate for conventional AVR by surgical approach. I think he may be a good candidate for TAVR.   I have reviewed the natural history of aortic stenosis with the patient and their family members  who are present today. We have discussed the limitations of medical therapy and the poor prognosis associated with symptomatic aortic stenosis. We have reviewed potential treatment options, including palliative medical therapy, conventional surgical aortic valve replacement, and transcatheter aortic valve replacement. We discussed treatment  options in the context of the patient's specific comorbid medical conditions.   I will plan to repeat his echo in December 2024. He will call back with any change in his symptoms prior to his next visit in 6 months.   I have spent 45 minutes today reviewing the natural history of AS, treatment options and expected outcomes using models and videos.   Labs/ tests ordered today include:  Orders Placed This Encounter  Procedures   EKG 12-Lead   ECHOCARDIOGRAM COMPLETE   Disposition:   F/U will be arranged with the structural team  Signed, Verne Carrow, MD, [facility] 06/30/2023 2:33 PM    [facility] [address] [place], [place], [place]  [address] Phone: [phone]; Fax: [phone]

## 2023-06-30 NOTE — Patient Instructions (Addendum)
Medication Instructions:  No changes *If you need a refill on your cardiac medications before your next appointment, please call your pharmacy*   Lab Work: none If you have labs (blood work) drawn today and your tests are completely normal, you will receive your results only by: MyChart Message (if you have MyChart) OR A paper copy in the mail If you have any lab test that is abnormal or we need to change your treatment, we will call you to review the results.   Testing/Procedures: Your physician has requested that you have an echocardiogram. Echocardiography is a painless test that uses sound waves to create images of your heart. It provides your doctor with information about the size and shape of your heart and how well your heart's chambers and valves are working. This procedure takes approximately one hour. There are no restrictions for this procedure. Please do NOT wear cologne, perfume, aftershave, or lotions (deodorant is allowed). Please arrive 15 minutes prior to your appointment time.    Follow-Up: At [facility], you and your health needs are our priority.  As part of our continuing mission to provide you with exceptional heart care, we have created designated Provider Care Teams.  These Care Teams include your primary Cardiologist (physician) and Advanced Practice Providers (APPs -  Physician Assistants and Nurse Practitioners) who all work together to provide you with the care you need, when you need it.   Your next appointment:   5 month(s)   Provider:   Verne Carrow, MD    Other Instructions

## 2023-07-15 DIAGNOSIS — I872 Venous insufficiency (chronic) (peripheral): Secondary | ICD-10-CM | POA: Diagnosis not present

## 2023-07-15 DIAGNOSIS — L812 Freckles: Secondary | ICD-10-CM | POA: Diagnosis not present

## 2023-07-15 DIAGNOSIS — I788 Other diseases of capillaries: Secondary | ICD-10-CM | POA: Diagnosis not present

## 2023-07-15 DIAGNOSIS — L570 Actinic keratosis: Secondary | ICD-10-CM | POA: Diagnosis not present

## 2023-07-15 DIAGNOSIS — L308 Other specified dermatitis: Secondary | ICD-10-CM | POA: Diagnosis not present

## 2023-07-15 DIAGNOSIS — L821 Other seborrheic keratosis: Secondary | ICD-10-CM | POA: Diagnosis not present

## 2023-07-15 DIAGNOSIS — I8311 Varicose veins of right lower extremity with inflammation: Secondary | ICD-10-CM | POA: Diagnosis not present

## 2023-07-15 DIAGNOSIS — D692 Other nonthrombocytopenic purpura: Secondary | ICD-10-CM | POA: Diagnosis not present

## 2023-07-15 DIAGNOSIS — I8312 Varicose veins of left lower extremity with inflammation: Secondary | ICD-10-CM | POA: Diagnosis not present

## 2023-07-27 ENCOUNTER — Other Ambulatory Visit: Payer: Self-pay | Admitting: Family Medicine

## 2023-07-27 DIAGNOSIS — E7800 Pure hypercholesterolemia, unspecified: Secondary | ICD-10-CM

## 2023-08-12 ENCOUNTER — Ambulatory Visit: Payer: PPO

## 2023-08-12 ENCOUNTER — Encounter (HOSPITAL_COMMUNITY): Payer: PPO

## 2023-08-12 DIAGNOSIS — L03032 Cellulitis of left toe: Secondary | ICD-10-CM | POA: Diagnosis not present

## 2023-08-12 DIAGNOSIS — L03031 Cellulitis of right toe: Secondary | ICD-10-CM | POA: Diagnosis not present

## 2023-09-04 ENCOUNTER — Encounter (HOSPITAL_COMMUNITY): Payer: PPO

## 2023-09-04 ENCOUNTER — Ambulatory Visit: Payer: PPO

## 2023-09-11 ENCOUNTER — Ambulatory Visit: Payer: PPO | Admitting: Physician Assistant

## 2023-09-11 ENCOUNTER — Ambulatory Visit (HOSPITAL_COMMUNITY)
Admission: RE | Admit: 2023-09-11 | Discharge: 2023-09-11 | Disposition: A | Discharge status: Home / Self Care | Payer: PPO | Source: Ambulatory Visit | Attending: Vascular Surgery | Admitting: Vascular Surgery

## 2023-09-11 VITALS — BP 154/75 | HR 61 | Temp 97.7°F | Resp 18 | Ht 69.0 in | Wt 181.4 lb

## 2023-09-11 DIAGNOSIS — I6523 Occlusion and stenosis of bilateral carotid arteries: Secondary | ICD-10-CM

## 2023-09-11 NOTE — Progress Notes (Signed)
Office Note   History of Present Illness   Anthony Dingeldein. is a 82 y.o. (29-Sep-1941) male who presents for surveillance of carotid artery stenosis.   He denies any recent CVA or TIA diagnosis. He also denies any recent strokelike symptoms such as slurred speech, facial droop, sudden visual changes, or sudden weakness/numbness.  He states he was just diagnosed with aortic stenosis, which will require intervention. His cardiologist plans on repeating his echocardiogram in December.  Currently he does not take his baby aspirin.  He states he bruised a lot on this medication.  Current Outpatient Medications  Medication Sig Dispense Refill   atorvastatin (LIPITOR) 20 MG tablet Take 1 tablet by mouth once daily 90 tablet 1   brimonidine (ALPHAGAN) 0.2 % ophthalmic solution Place 1 drop into the right eye 3 (three) times daily.     calcium elemental as carbonate (TUMS ULTRA 1000) 400 MG chewable tablet Chew 1,000 mg by mouth at bedtime.     Cholecalciferol (VITAMIN D) 125 MCG (5000 UT) CAPS Take 5,000 Units by mouth daily.     Coenzyme Q10 (COQ10) 100 MG CAPS Take 100-200 mg by mouth daily.     CRANBERRY PO Take 650 mg by mouth daily.     diphenhydrAMINE (BENADRYL) 25 mg capsule Take 25 mg by mouth daily as needed (Allergies).     dorzolamide-timolol (COSOPT) 22.3-6.8 MG/ML ophthalmic solution Place 1 drop into the right eye 2 (two) times daily.     hyoscyamine (LEVSIN SL) 0.125 MG SL tablet DISSOLVE 1 TABLET IN MOUTH EVERY 8 HOURS AS NEEDED (Patient taking differently: Take 0.125 mg by mouth 3 (three) times daily as needed (Stomach upset).) 120 tablet 2   levothyroxine (SYNTHROID) 100 MCG tablet Take 1 tablet (100 mcg total) by mouth daily before breakfast. 90 tablet 1   loratadine (CLARITIN) 10 MG tablet Take 10 mg by mouth daily.     Magnesium 400 MG TABS Take 400 mg by mouth daily.     Multiple Vitamin (MULTIVITAMIN WITH MINERALS) TABS tablet Take 1 tablet by mouth daily. Centrum  silver     Multiple Vitamins-Minerals (HAIR SKIN NAILS PO) Take 1 tablet by mouth daily.     Netarsudil-Latanoprost (ROCKLATAN) 0.02-0.005 % SOLN Place 1 drop into both eyes at bedtime.     NON FORMULARY Take 10 mg by mouth at bedtime. CBD Gummy hemp isolate     NON FORMULARY Take 2 tablets by mouth 3 (three) times daily. Flavonoid     Omega-3 Fatty Acids (FISH OIL PO) Take 1,470 mg by mouth daily.     sodium chloride (OCEAN) 0.65 % SOLN nasal spray Place 1 spray into both nostrils as needed for congestion.     No current facility-administered medications for this visit.    REVIEW OF SYSTEMS (negative unless checked):   Cardiac:  []  Chest pain or chest pressure? []  Shortness of breath upon activity? []  Shortness of breath when lying flat? []  Irregular heart rhythm?  Vascular:  []  Pain in calf, thigh, or hip brought on by walking? []  Pain in feet at night that wakes you up from your sleep? []  Blood clot in your veins? []  Leg swelling?  Pulmonary:  []  Oxygen at home? []  Productive cough? []  Wheezing?  Neurologic:  []  Sudden weakness in arms or legs? []  Sudden numbness in arms or legs? []  Sudden onset of difficult speaking or slurred speech? []  Temporary loss of vision in one eye? []  Problems with dizziness?  Gastrointestinal:  []   Blood in stool? []  Vomited blood?  Genitourinary:  []  Burning when urinating? []  Blood in urine?  Psychiatric:  []  Major depression  Hematologic:  []  Bleeding problems? []  Problems with blood clotting?  Dermatologic:  []  Rashes or ulcers?  Constitutional:  []  Fever or chills?  Ear/Nose/Throat:  []  Change in hearing? []  Nose bleeds? []  Sore throat?  Musculoskeletal:  []  Back pain? []  Joint pain? []  Muscle pain?   Physical Examination   Vitals:   09/11/23 1417  BP: (!) 154/75  Pulse: 61  Resp: 18  Temp: 97.7 F (36.5 C)  TempSrc: Temporal  SpO2: 97%  Weight: 181 lb 6.4 oz (82.3 kg)  Height: 5\' 9"  (1.753 m)   Body  mass index is 26.79 kg/m.  General:  WDWN in NAD; vital signs documented above Gait: Not observed HENT: WNL, normocephalic Pulmonary: normal non-labored breathing  Cardiac: Regular rate with systolic murmur.  Bilateral carotid bruits Abdomen: soft, NT, no masses Skin: without rashes Vascular Exam/Pulses: palpable radial pulses bilaterally Extremities: without ischemic changes, without gangrene , without cellulitis; without open wounds;  Musculoskeletal: no muscle wasting or atrophy  Neurologic: A&O X 3;  No focal weakness or paresthesias are detected Psychiatric:  The pt has Normal affect.  Non-Invasive Vascular Imaging   Bilateral Carotid Duplex (09/11/2023):  R ICA stenosis:  40-59% R VA:  patent and antegrade L ICA stenosis:  1-39% L VA:  patent and antegrade   Medical Decision Making   [facility] Anthony Dann. is a 82 y.o. male who presents for surveillance of carotid artery stenosis  Based on the patient's vascular studies, his carotid artery stenosis is unchanged from his previous visit.  His right ICA stenosis is 40 to 59%.  Left ICA stenosis is 1 to 39% He denies any strokelike symptoms such as slurred speech, facial droop, sudden visual changes, or sudden weakness/numbness. He has palpable and equal radial pulses bilaterally.  He has a systolic murmur on exam due to aortic stenosis Given his history of easy bruising with aspirin, I have recommended he try taking this medication every other day.  He can follow-up with our office in 1 year with repeat carotid duplex   Loel Dubonnet PA-C [facility] Office: [phone]  Clinic MD: Anthony Noble

## 2023-09-22 DIAGNOSIS — H25813 Combined forms of age-related cataract, bilateral: Secondary | ICD-10-CM | POA: Diagnosis not present

## 2023-09-22 DIAGNOSIS — H401221 Low-tension glaucoma, left eye, mild stage: Secondary | ICD-10-CM | POA: Diagnosis not present

## 2023-09-22 DIAGNOSIS — E119 Type 2 diabetes mellitus without complications: Secondary | ICD-10-CM | POA: Diagnosis not present

## 2023-09-22 DIAGNOSIS — H401213 Low-tension glaucoma, right eye, severe stage: Secondary | ICD-10-CM | POA: Diagnosis not present

## 2023-10-07 ENCOUNTER — Other Ambulatory Visit: Payer: Self-pay

## 2023-10-07 DIAGNOSIS — I6523 Occlusion and stenosis of bilateral carotid arteries: Secondary | ICD-10-CM

## 2023-10-28 ENCOUNTER — Other Ambulatory Visit: Payer: PPO

## 2023-10-28 DIAGNOSIS — Z0000 Encounter for general adult medical examination without abnormal findings: Secondary | ICD-10-CM | POA: Diagnosis not present

## 2023-10-29 LAB — CBC WITH DIFFERENTIAL/PLATELET
Basos: 1 %
EOS (ABSOLUTE): 0.1 10*3/uL (ref 0.0–0.2)
Eos: 2 %
Hematocrit: 39 % (ref 37.5–51.0)
Hemoglobin: 12.7 g/dL — ABNORMAL LOW (ref 13.0–17.7)
Immature Granulocytes: 0 %
Immature Granulocytes: 0 10*3/uL (ref 0.0–0.1)
Lymphocytes Absolute: 1.9 10*3/uL (ref 0.7–3.1)
Lymphs: 30 %
MCH: 29.5 pg (ref 26.6–33.0)
MCHC: 32.6 g/dL (ref 31.5–35.7)
MCV: 91 fL (ref 79–97)
Monocytes Absolute: 0.1 10*3/uL (ref 0.0–0.4)
Monocytes Absolute: 0.5 10*3/uL (ref 0.1–0.9)
Monocytes: 9 %
Neutrophils Absolute: 3.6 10*3/uL (ref 1.4–7.0)
Neutrophils: 58 %
Platelets: 210 10*3/uL (ref 150–450)
RBC: 4.31 x10E6/uL (ref 4.14–5.80)
RDW: 13 % (ref 11.6–15.4)
WBC: 6.1 10*3/uL (ref 3.4–10.8)

## 2023-10-29 LAB — CMP14+EGFR
ALT: 22 IU/L (ref 0–44)
AST: 32 IU/L (ref 0–40)
Albumin: 4.2 g/dL (ref 3.7–4.7)
Alkaline Phosphatase: 65 [IU]/L (ref 44–121)
BUN/Creatinine Ratio: 18 (ref 10–24)
BUN: 19 mg/dL (ref 8–27)
Bilirubin Total: 0.5 mg/dL (ref 0.0–1.2)
CO2: 23 mmol/L (ref 20–29)
Calcium: 8.8 mg/dL (ref 8.6–10.2)
Chloride: 102 mmol/L (ref 96–106)
Creatinine, Ser: 1.03 mg/dL (ref 0.76–1.27)
Globulin, Total: 1.9 g/dL (ref 1.5–4.5)
Glucose: 85 mg/dL (ref 70–99)
Potassium: 3.8 mmol/L (ref 3.5–5.2)
Sodium: 138 mmol/L (ref 134–144)
Total Protein: 6.1 g/dL (ref 6.0–8.5)
eGFR: 73 mL/min/{1.73_m2} (ref >59)

## 2023-10-29 LAB — LIPID PANEL
Chol/HDL Ratio: 3.6 ratio (ref 0.0–5.0)
Cholesterol, Total: 147 mg/dL (ref 100–199)
HDL: 41 mg/dL (ref >39)
LDL Chol Calc (NIH): 88 mg/dL (ref 0–99)
Triglycerides: 96 mg/dL (ref 0–149)
VLDL Cholesterol Cal: 18 mg/dL (ref 5–40)

## 2023-10-29 LAB — TSH: TSH: 13.1 u[IU]/mL — ABNORMAL HIGH (ref 0.450–4.500)

## 2023-10-30 ENCOUNTER — Encounter: Payer: Self-pay | Admitting: Family Medicine

## 2023-10-30 ENCOUNTER — Ambulatory Visit: Payer: PPO | Admitting: Family Medicine

## 2023-10-30 VITALS — BP 121/56 | HR 60 | Ht 69.0 in | Wt 172.0 lb

## 2023-10-30 DIAGNOSIS — R351 Nocturia: Secondary | ICD-10-CM

## 2023-10-30 DIAGNOSIS — E7800 Pure hypercholesterolemia, unspecified: Secondary | ICD-10-CM

## 2023-10-30 DIAGNOSIS — E039 Hypothyroidism, unspecified: Secondary | ICD-10-CM | POA: Diagnosis not present

## 2023-10-30 DIAGNOSIS — N401 Enlarged prostate with lower urinary tract symptoms: Secondary | ICD-10-CM | POA: Diagnosis not present

## 2023-10-30 DIAGNOSIS — Z0001 Encounter for general adult medical examination with abnormal findings: Secondary | ICD-10-CM | POA: Diagnosis not present

## 2023-10-30 DIAGNOSIS — Z0000 Encounter for general adult medical examination without abnormal findings: Secondary | ICD-10-CM

## 2023-10-30 MED ORDER — LEVOTHYROXINE SODIUM 125 MCG PO TABS: 125.0000 ug | ORAL_TABLET | Freq: Every day | ORAL | 3 refills | Status: DC

## 2023-10-30 MED ORDER — ATORVASTATIN CALCIUM 20 MG PO TABS: 20.0000 mg | ORAL_TABLET | Freq: Every day | ORAL | 3 refills | Status: DC

## 2023-10-30 NOTE — Progress Notes (Signed)
BP (!) 121/56   Pulse 60   Ht 5\' 9"  (1.753 m)   Wt 172 lb (78 kg)   SpO2 98%   BMI 25.40 kg/m    Subjective:   Patient ID: Anthony Noble., male    DOB: 1941-07-02, 82 y.o.   MRN: [id number]  HPI: Anthony Noble. is a 82 y.o. male presenting on 10/30/2023 for Medical Management of Chronic Issues (CPE)  Annual Exam Patient is here today for his annual exam. He seems to be doing well and staying active. He denies fevers, chills, chest pain, palpitations, shortness of breath, abdominal pain, dysuria, and syncope.  Hyperlipidemia Patient is currently taking atorvastatin. He denies myalgias or weakness. He does not have a history of liver damage from it.   Hypothyroidism Patient is currently taking levothyroxine 100 mcg. He reports cold intolerance and occasional numbness in his fingertips but otherwise denies hair or skin changes, heat intolerance, diarrhea or constipation, chest pain, palpitations.  BPH Patient is not currently taking any medications. He reports urinary hesitancy and nocturia. He denies dysuria, hematuria, urinary urgency, or decreased urination.  Patient also reports occasional heartburn after eating spicy foods like salsa. He can feel a slight burn in his throat but states that it is barely perceptible. He has not tried any medications. He denies abdominal pain or blood in stool.  Patient reports chronic tinnitus. He does not remember when the tinnitus started, but he thinks around a year. He mainly notices the tinnitus when he wakes up in the middle of the night when there is no ambient noise. He has been evaluated by audiology, which showed high frequency hearing loss. He is currently taking lipoflavin and CBD gummies, but he is unsure whether these are helping.  Relevant past medical, surgical, family and social history reviewed and updated as indicated. Interim medical history since our last visit reviewed. Allergies and medications reviewed and  updated.  Review of Systems  Constitutional:  Negative for chills and fever.  HENT:  Positive for tinnitus. Negative for congestion and sore throat.   Eyes:  Negative for visual disturbance.  Respiratory:  Negative for cough, chest tightness and shortness of breath.   Cardiovascular:  Negative for chest pain, palpitations and leg swelling.  Gastrointestinal:  Negative for abdominal pain, blood in stool, constipation and diarrhea.       Occasional heartburn after eating spicy foods  Endocrine: Positive for cold intolerance. Negative for heat intolerance.  Genitourinary:  Positive for difficulty urinating and enuresis. Negative for decreased urine volume, dysuria, hematuria and urgency.  Musculoskeletal:  Positive for arthralgias. Negative for myalgias.       Bilateral intermittent shoulder pain that improves with position changes  Neurological:  Positive for light-headedness and numbness. Negative for dizziness, syncope, weakness and headaches.       Occasional lightheadedness with standing and numbness in fingertips    Per HPI unless specifically indicated above   Allergies as of 10/30/2023       Reactions   Bcg Live Other (See Comments)   Treatment pt had- shakes   Septra [bactrim] Other (See Comments)   Muscle weakness, numbness   Sulfa Antibiotics Other (See Comments)   Severe weakness   Sulfamethoxazole-trimethoprim Other (See Comments)   Muscle weakness, numbness   Pertussis Vaccines    History of Gillian barrie        Medication List        Accurate as of October 30, 2023 11:11 AM. If you  have any questions, ask your nurse or doctor.          aspirin EC 81 MG tablet Take 81 mg by mouth daily. Swallow whole.   atorvastatin 20 MG tablet Commonly known as: LIPITOR Take 1 tablet (20 mg total) by mouth daily.   brimonidine 0.2 % ophthalmic solution Commonly known as: ALPHAGAN Place 1 drop into the right eye 3 (three) times daily.   CoQ10 100 MG Caps Take  100-200 mg by mouth daily.   CRANBERRY PO Take 650 mg by mouth daily.   diphenhydrAMINE 25 mg capsule Commonly known as: BENADRYL Take 25 mg by mouth daily as needed (Allergies).   dorzolamide-timolol 2-0.5 % ophthalmic solution Commonly known as: COSOPT Place 1 drop into the right eye 2 (two) times daily.   ferrous sulfate 325 (65 FE) MG EC tablet Take 325 mg by mouth daily.   FISH OIL PO Take 1,470 mg by mouth daily.   HAIR SKIN NAILS PO Take 1 tablet by mouth daily.   hyoscyamine 0.125 MG SL tablet Commonly known as: LEVSIN SL DISSOLVE 1 TABLET IN MOUTH EVERY 8 HOURS AS NEEDED What changed: See the new instructions.   levothyroxine 125 MCG tablet Commonly known as: SYNTHROID Take 1 tablet (125 mcg total) by mouth daily before breakfast. What changed:  medication strength how much to take Changed by: Elige Radon Buffy Ehler   loratadine 10 MG tablet Commonly known as: CLARITIN Take 10 mg by mouth daily.   Magnesium 400 MG Tabs Take 400 mg by mouth daily.   multivitamin with minerals Tabs tablet Take 1 tablet by mouth daily. Centrum silver   NON FORMULARY Take 10 mg by mouth at bedtime. CBD Gummy hemp isolate   NON FORMULARY Take 2 tablets by mouth 3 (three) times daily. Flavonoid   Rocklatan 0.02-0.005 % Soln Generic drug: Netarsudil-Latanoprost Place 1 drop into both eyes at bedtime.   sodium chloride 0.65 % Soln nasal spray Commonly known as: OCEAN Place 1 spray into both nostrils as needed for congestion.   Tums Ultra 1000 1000 MG chewable tablet Generic drug: calcium elemental as carbonate Chew 1,000 mg by mouth at bedtime.   Vitamin D 125 MCG (5000 UT) Caps Take 5,000 Units by mouth daily.         Objective:   BP (!) 121/56   Pulse 60   Ht 5\' 9"  (1.753 m)   Wt 172 lb (78 kg)   SpO2 98%   BMI 25.40 kg/m   Wt Readings from Last 3 Encounters:  10/30/23 172 lb (78 kg)  09/11/23 181 lb 6.4 oz (82.3 kg)  06/30/23 178 lb 3.2 oz (80.8 kg)     Physical Exam Vitals and nursing note reviewed.  Constitutional:      General: He is not in acute distress.    Appearance: Normal appearance. He is normal weight.  HENT:     Head: Normocephalic and atraumatic.     Right Ear: Tympanic membrane, ear canal and external ear normal.     Left Ear: Tympanic membrane, ear canal and external ear normal.     Nose: No congestion.     Mouth/Throat:     Mouth: Mucous membranes are moist.     Pharynx: Oropharynx is clear.  Eyes:     Extraocular Movements: Extraocular movements intact.     Conjunctiva/sclera: Conjunctivae normal.  Cardiovascular:     Rate and Rhythm: Normal rate and regular rhythm.     Heart sounds: Murmur heard.  Comments: 4/6 systolic crescendo-decrescendo murmur at the RUSB Pulmonary:     Effort: Pulmonary effort is normal.     Breath sounds: Normal breath sounds. No wheezing or rales.  Abdominal:     General: Abdomen is flat. There is no distension.     Palpations: Abdomen is soft.     Tenderness: There is no abdominal tenderness.  Genitourinary:    Comments: Patient declined prostate exam Musculoskeletal:     Cervical back: Normal range of motion and neck supple. No tenderness.     Comments: Trace bilateral lower extremity edema  Skin:    General: Skin is warm and dry.  Neurological:     Mental Status: He is alert and oriented to person, place, and time.  Psychiatric:        Mood and Affect: Mood normal.        Behavior: Behavior normal.        Thought Content: Thought content normal.        Judgment: Judgment normal.    Results for orders placed or performed in visit on 10/28/23  TSH  Result Value Ref Range   TSH 13.100 (H) 0.450 - 4.500 uIU/mL  CBC with Differential/Platelet  Result Value Ref Range   WBC 6.1 3.4 - 10.8 x10E3/uL   RBC 4.31 4.14 - 5.80 x10E6/uL   Hemoglobin 12.7 (L) 13.0 - 17.7 g/dL   Hematocrit 16.6 06.3 - 51.0 %   MCV 91 79 - 97 fL   MCH 29.5 26.6 - 33.0 pg   MCHC 32.6 31.5 -  35.7 g/dL   RDW 01.6 01.0 - 93.2 %   Platelets 210 150 - 450 x10E3/uL   Neutrophils 58 Not Estab. %   Lymphs 30 Not Estab. %   Monocytes 9 Not Estab. %   Eos 2 Not Estab. %   Basos 1 Not Estab. %   Neutrophils Absolute 3.6 1.4 - 7.0 x10E3/uL   Lymphocytes Absolute 1.9 0.7 - 3.1 x10E3/uL   Monocytes Absolute 0.5 0.1 - 0.9 x10E3/uL   EOS (ABSOLUTE) 0.1 0.0 - 0.4 x10E3/uL   Basophils Absolute 0.1 0.0 - 0.2 x10E3/uL   Immature Granulocytes 0 Not Estab. %   Immature Grans (Abs) 0.0 0.0 - 0.1 x10E3/uL  CMP14+EGFR  Result Value Ref Range   Glucose 85 70 - 99 mg/dL   BUN 19 8 - 27 mg/dL   Creatinine, Ser 3.55 0.76 - 1.27 mg/dL   eGFR 73 >73 UK/GUR/4.27   BUN/Creatinine Ratio 18 10 - 24   Sodium 138 134 - 144 mmol/L   Potassium 3.8 3.5 - 5.2 mmol/L   Chloride 102 96 - 106 mmol/L   CO2 23 20 - 29 mmol/L   Calcium 8.8 8.6 - 10.2 mg/dL   Total Protein 6.1 6.0 - 8.5 g/dL   Albumin 4.2 3.7 - 4.7 g/dL   Globulin, Total 1.9 1.5 - 4.5 g/dL   Bilirubin Total 0.5 0.0 - 1.2 mg/dL   Alkaline Phosphatase 65 44 - 121 IU/L   AST 32 0 - 40 IU/L   ALT 22 0 - 44 IU/L  Lipid panel  Result Value Ref Range   Cholesterol, Total 147 100 - 199 mg/dL   Triglycerides 96 0 - 149 mg/dL   HDL 41 >06 mg/dL   VLDL Cholesterol Cal 18 5 - 40 mg/dL   LDL Chol Calc (NIH) 88 0 - 99 mg/dL   Chol/HDL Ratio 3.6 0.0 - 5.0 ratio    Assessment & Plan:   Problem  List Items Addressed This Visit       Endocrine   Hypothyroidism   Relevant Medications   levothyroxine (SYNTHROID) 125 MCG tablet     Genitourinary   BPH (benign prostatic hyperplasia)     Other   Hyperlipidemia   Relevant Medications   aspirin EC 81 MG tablet   atorvastatin (LIPITOR) 20 MG tablet   Other Visit Diagnoses     Physical exam    -  Primary       Patient is doing well with no major health concerns. Blood pressure remains normal at 121/56 without medications. Patient is tolerating statin well and is at goal with LDL < 100. TSH is  elevated at 13.1000, so will increase levothyroxine to 125 mcg. BPH symptoms are stable without medications. Recommended patient start taking Pepcid AC as needed for heartburn.  Follow up plan: Return in about 2 months (around 12/30/2023), or if symptoms worsen or fail to improve, for  thyroid recheck.  Counseling provided for all of the vaccine components No orders of the defined types were placed in this encounter.   Gillermina Phy, Medical Student [facility] 10/30/2023, 11:11 AM  I was personally present for all components of the history, physical exam and/or medical decision making.  I agree with the documentation performed by the medical student and agree with assessment and plan above.  medical student was Gillermina Phy. Arville Care, MD [facility] 11/12/2023, 8:07 AM

## 2023-11-03 DIAGNOSIS — H25813 Combined forms of age-related cataract, bilateral: Secondary | ICD-10-CM | POA: Diagnosis not present

## 2023-11-03 DIAGNOSIS — H401213 Low-tension glaucoma, right eye, severe stage: Secondary | ICD-10-CM | POA: Diagnosis not present

## 2023-11-03 DIAGNOSIS — E119 Type 2 diabetes mellitus without complications: Secondary | ICD-10-CM | POA: Diagnosis not present

## 2023-11-03 DIAGNOSIS — H401221 Low-tension glaucoma, left eye, mild stage: Secondary | ICD-10-CM | POA: Diagnosis not present

## 2023-11-10 MED ORDER — LEVOTHYROXINE SODIUM 137 MCG PO TABS: 137.0000 ug | ORAL_TABLET | Freq: Every day | ORAL | 1 refills | Status: DC

## 2023-11-15 DIAGNOSIS — H10013 Acute follicular conjunctivitis, bilateral: Secondary | ICD-10-CM | POA: Diagnosis not present

## 2023-11-17 DIAGNOSIS — Z8551 Personal history of malignant neoplasm of bladder: Secondary | ICD-10-CM | POA: Diagnosis not present

## 2023-11-21 ENCOUNTER — Institutional Professional Consult (permissible substitution): Payer: PPO | Admitting: Cardiovascular Disease

## 2023-11-25 ENCOUNTER — Ambulatory Visit (HOSPITAL_COMMUNITY): Payer: PPO | Attending: Cardiovascular Disease

## 2023-11-25 DIAGNOSIS — I350 Nonrheumatic aortic (valve) stenosis: Secondary | ICD-10-CM | POA: Insufficient documentation

## 2023-11-26 LAB — ECHOCARDIOGRAM COMPLETE
AR max vel: 0.6 cm2
AV Area VTI: 0.66 cm2
AV Area mean vel: 0.62 cm2
AV Mean grad: 54.5 mm[Hg]
AV Peak grad: 92.5 mm[Hg]
Ao pk vel: 4.81 m/s
Area-P 1/2: 1.85 cm2
P 1/2 time: 372 ms
S' Lateral: 2.5 cm

## 2023-12-15 NOTE — H&P (View-Only) (Signed)
Cardiology Office Note:  .   Date:  12/18/2023  ID:  Anthony Noble., DOB 14-Aug-1941, MRN [id number] PCP: Dettinger, Elige Radon, MD  [facility] Providers Cardiologist:  None { History of Present Illness: .   [facility] Anthony Goodrum. is a 83 y.o. male with history of severe AS who presents for follow-up.    History of Present Illness   Anthony Noble, an 83 year old individual with a history of severe aortic stenosis, presents for a follow-up visit regarding severe aortic stenosis. Despite the severity of the condition, the patient remains asymptomatic and maintains an active lifestyle, walking up to three miles per day. The patient expresses some hesitation about undergoing a heart valve replacement procedure but understands the necessity due to the severity of the aortic stenosis.  In addition to the aortic stenosis, the patient has hyperlipidemia and carotid artery disease. The patient is currently on a 20mg  dosage of Lipitor for the hyperlipidemia. The patient has been adhering to the medication regimen and reports no adverse effects from the medication. The patient also takes aspirin due to the carotid artery disease.          Problem List Aortic stenosis -very severe AS 05/2023: Vmax 4.8 m/s, MG 53 mmHG Carotid artery disease  -R ICA 40-59% 3. HLD -T chol 147, HDL 41, LDL 88, TG 96    ROS: All other ROS reviewed and negative. Pertinent positives noted in the HPI.     Studies Reviewed: Marland Kitchen   EKG Interpretation Date/Time:  Thursday December 18 2023 12:54:30 EST Ventricular Rate:  69 PR Interval:  134 QRS Duration:  88 QT Interval:  416 QTC Calculation: 445 R Axis:   -15  Text Interpretation: Normal sinus rhythm Nonspecific ST abnormality Confirmed by Lennie Odor [phone]) on 12/18/2023 1:01:05 PM   Physical Exam:   VS:  BP 138/68 (BP Location: Right Arm, Patient Position: Sitting, Cuff Size: Normal)   Pulse 69   Ht 5\' 9"  (1.753 m)   Wt 180 lb (81.6 kg)   BMI 26.58  kg/m    Wt Readings from Last 3 Encounters:  12/18/23 180 lb (81.6 kg)  10/30/23 172 lb (78 kg)  09/11/23 181 lb 6.4 oz (82.3 kg)    GEN: Well nourished, well developed in no acute distress NECK: No JVD; No carotid bruits CARDIAC: RRR, 3/6 SEM, late peaking, rubs, gallops RESPIRATORY:  Clear to auscultation without rales, wheezing or rhonchi  ABDOMEN: Soft, non-tender, non-distended EXTREMITIES:  No edema; No deformity  ASSESSMENT AND PLAN: .   Assessment and Plan    Severe Aortic Stenosis, very severe -Vmax 4.8 m/s, MG 43 mmHG -Asymptomatic with good exercise tolerance. Discussed the risk of heart failure and adverse outcomes if left untreated despite lack of symptoms. -He is willing to proceed with RAVR work-up -Schedule left and right heart catheterization with Dr. Camillo Flaming. -Reach out to structural heart team for structural CT examinations. -Plan for Transcatheter Aortic Valve Replacement (TAVR) procedure.  Hyperlipidemia with Carotid Artery Disease -R ICA 50-69% -Discussed the need to lower cholesterol levels further. -Increase Lipitor to 40mg  daily. -yearly carotid US  Follow-up in 6 months after TAVR procedure.          Informed Consent   Shared Decision Making/Informed Consent The risks [stroke (1 in 1000), death (1 in 1000), kidney failure [usually temporary] (1 in 500), bleeding (1 in 200), allergic reaction [possibly serious] (1 in 200)], benefits (diagnostic support and management of coronary artery disease) and alternatives of  a cardiac catheterization were discussed in detail with Anthony Noble and he is willing to proceed.      Follow-up: Return in about 6 months (around 06/16/2024).  Time Spent with Patient: I have spent a total of 35 minutes caring for this patient today face to face, ordering and reviewing labs/tests, reviewing prior records/medical history, examining the patient, establishing an assessment and plan, communicating results/findings to the  patient/family, and documenting in the medical record.   Signed, Lenna Gilford. Flora Lipps, MD, [facility]  [facility]  [address] [place], [place] [address] [phone]  1:38 PM

## 2023-12-15 NOTE — Progress Notes (Signed)
 Cardiology Office Note:  .   Date:  12/18/2023  ID:  Anthony Noble., DOB 14-Aug-1941, MRN [id number] PCP: Dettinger, Elige Radon, MD  [facility] Providers Cardiologist:  None { History of Present Illness: .   [facility] Anthony Noble. is a 83 y.o. male with history of severe AS who presents for follow-up.    History of Present Illness   [name], an 83 year old individual with a history of severe aortic stenosis, presents for a follow-up visit regarding severe aortic stenosis. Despite the severity of the condition, the patient remains asymptomatic and maintains an active lifestyle, walking up to three miles per day. The patient expresses some hesitation about undergoing a heart valve replacement procedure but understands the necessity due to the severity of the aortic stenosis.  In addition to the aortic stenosis, the patient has hyperlipidemia and carotid artery disease. The patient is currently on a 20mg  dosage of Lipitor for the hyperlipidemia. The patient has been adhering to the medication regimen and reports no adverse effects from the medication. The patient also takes aspirin due to the carotid artery disease.          Problem List Aortic stenosis -very severe AS 05/2023: Vmax 4.8 m/s, MG 53 mmHG Carotid artery disease  -R ICA 40-59% 3. HLD -T chol 147, HDL 41, LDL 88, TG 96    ROS: All other ROS reviewed and negative. Pertinent positives noted in the HPI.     Studies Reviewed: Marland Kitchen   EKG Interpretation Date/Time:  Thursday December 18 2023 12:54:30 EST Ventricular Rate:  69 PR Interval:  134 QRS Duration:  88 QT Interval:  416 QTC Calculation: 445 R Axis:   -15  Text Interpretation: Normal sinus rhythm Nonspecific ST abnormality Confirmed by Lennie Odor [phone]) on 12/18/2023 1:01:05 PM   Physical Exam:   VS:  BP 138/68 (BP Location: Right Arm, Patient Position: Sitting, Cuff Size: Normal)   Pulse 69   Ht 5\' 9"  (1.753 m)   Wt 180 lb (81.6 kg)   BMI 26.58  kg/m    Wt Readings from Last 3 Encounters:  12/18/23 180 lb (81.6 kg)  10/30/23 172 lb (78 kg)  09/11/23 181 lb 6.4 oz (82.3 kg)    GEN: Well nourished, well developed in no acute distress NECK: No JVD; No carotid bruits CARDIAC: RRR, 3/6 SEM, late peaking, rubs, gallops RESPIRATORY:  Clear to auscultation without rales, wheezing or rhonchi  ABDOMEN: Soft, non-tender, non-distended EXTREMITIES:  No edema; No deformity  ASSESSMENT AND PLAN: .   Assessment and Plan    Severe Aortic Stenosis, very severe -Vmax 4.8 m/s, MG 43 mmHG -Asymptomatic with good exercise tolerance. Discussed the risk of heart failure and adverse outcomes if left untreated despite lack of symptoms. -He is willing to proceed with RAVR work-up -Schedule left and right heart catheterization with Dr. Camillo Flaming. -Reach out to structural heart team for structural CT examinations. -Plan for Transcatheter Aortic Valve Replacement (TAVR) procedure.  Hyperlipidemia with Carotid Artery Disease -R ICA 50-69% -Discussed the need to lower cholesterol levels further. -Increase Lipitor to 40mg  daily. -yearly carotid US  Follow-up in 6 months after TAVR procedure.          Informed Consent   Shared Decision Making/Informed Consent The risks [stroke (1 in 1000), death (1 in 1000), kidney failure [usually temporary] (1 in 500), bleeding (1 in 200), allergic reaction [possibly serious] (1 in 200)], benefits (diagnostic support and management of coronary artery disease) and alternatives of  a cardiac catheterization were discussed in detail with Mr. [name] and he is willing to proceed.      Follow-up: Return in about 6 months (around 06/16/2024).  Time Spent with Patient: I have spent a total of 35 minutes caring for this patient today face to face, ordering and reviewing labs/tests, reviewing prior records/medical history, examining the patient, establishing an assessment and plan, communicating results/findings to the  patient/family, and documenting in the medical record.   Signed, Lenna Gilford. Flora Lipps, MD, [facility]  [facility]  [address] [place], [place] [address] [phone]  1:38 PM

## 2023-12-18 ENCOUNTER — Encounter: Payer: Self-pay | Admitting: Cardiovascular Disease

## 2023-12-18 ENCOUNTER — Ambulatory Visit: Payer: PPO | Attending: Cardiovascular Disease | Admitting: Cardiovascular Disease

## 2023-12-18 VITALS — BP 138/68 | HR 69 | Ht 69.0 in | Wt 180.0 lb

## 2023-12-18 DIAGNOSIS — I6521 Occlusion and stenosis of right carotid artery: Secondary | ICD-10-CM

## 2023-12-18 DIAGNOSIS — E782 Mixed hyperlipidemia: Secondary | ICD-10-CM | POA: Diagnosis not present

## 2023-12-18 DIAGNOSIS — H00021 Hordeolum internum right upper eyelid: Secondary | ICD-10-CM | POA: Diagnosis not present

## 2023-12-18 DIAGNOSIS — I350 Nonrheumatic aortic (valve) stenosis: Secondary | ICD-10-CM

## 2023-12-18 MED ORDER — ATORVASTATIN CALCIUM 40 MG PO TABS: 40.0000 mg | ORAL_TABLET | Freq: Every day | ORAL | 3 refills | Status: DC

## 2023-12-18 NOTE — Patient Instructions (Addendum)
 Medication Instructions:  INCREASE LIPITOR  TO 40MG  DAILY    *If you need a refill on your cardiac medications before your next appointment, please call your pharmacy*   Lab Work: CBC BMP    If you have labs (blood work) drawn today and your tests are completely normal, you will receive your results only by: MyChart Message (if you have MyChart) OR A paper copy in the mail If you have any lab test that is abnormal or we need to change your treatment, we will call you to review the results.   Testing/Procedures:  [facility] [facility] [place] [address] [place] [address] Dept: [phone] Loc: [phone]  Prentice Macleod Ksiazek Jr.  12/18/2023  You are scheduled for a Cardiac Catheterization on Thursday, January 16 with Dr. Lonni Cash.  1. Please arrive at the [facility] (Main Entrance A) at [facility]: [address] [place], [place] [address] at 7:00 AM (This time is 2 hour(s) before your procedure to ensure your preparation).   Free valet parking service is available. You will check in at ADMITTING. The support person will be asked to wait in the waiting room.  It is OK to have someone drop you off and come back when you are ready to be discharged.    Special note: Every effort is made to have your procedure done on time. Please understand that emergencies sometimes delay scheduled procedures.  2. Diet: Do not eat solid foods after midnight.  The patient may have clear liquids until 5am upon the day of the procedure.  3. Labs: You will need to have blood drawn on Thursday, January 9 at [facility] [address], [place]  Open: 8am - 5pm (Lunch 12:30 - 1:30)   Phone: [phone]. You do not need to be fasting.  4. Medication instructions in preparation for your procedure:   Contrast Allergy: No   On the morning of your procedure, take your  Aspirin  81 mg and any morning medicines NOT listed above.  You may use sips of water .  5. Plan to go home the same day, you will only stay overnight if medically necessary. 6. Bring a current list of your medications and current insurance cards. 7. You MUST have a responsible person to drive you home. 8. Someone MUST be with you the first 24 hours after you arrive home or your discharge will be delayed. 9. Please wear clothes that are easy to get on and off and wear slip-on shoes.  Thank you for allowing us  to care for you!   -- [facility]    Follow-Up: At [facility], you and your health needs are our priority.  As part of our continuing mission to provide you with exceptional heart care, we have created designated Provider Care Teams.  These Care Teams include your primary Cardiologist (physician) and Advanced Practice Providers (APPs -  Physician Assistants and Nurse Practitioners) who all work together to provide you with the care you need, when you need it.  We recommend signing up for the patient portal called MyChart.  Sign up information is provided on this After Visit Summary.  MyChart is used to connect with patients for Virtual Visits (Telemedicine).  Patients are able to view lab/test results, encounter notes, upcoming appointments, etc.  Non-urgent messages can be sent to your provider as well.   To learn more about what you  can do with MyChart, go to [email or web address].    Your next appointment:   6 month(s)  The format for your next appointment:   In Person  Provider:   Darryle Decent, MD    Other Instructions

## 2023-12-19 LAB — CBC
Hematocrit: 39.9 % (ref 37.5–51.0)
Hemoglobin: 13.3 g/dL (ref 13.0–17.7)
MCH: 30 pg (ref 26.6–33.0)
MCHC: 33.3 g/dL (ref 31.5–35.7)
MCV: 90 fL (ref 79–97)
Platelets: 232 10*3/uL (ref 150–450)
RBC: 4.44 x10E6/uL (ref 4.14–5.80)
RDW: 12.9 % (ref 11.6–15.4)
WBC: 6.5 10*3/uL (ref 3.4–10.8)

## 2023-12-19 LAB — BASIC METABOLIC PANEL
BUN/Creatinine Ratio: 12 (ref 10–24)
BUN: 14 mg/dL (ref 8–27)
CO2: 24 mmol/L (ref 20–29)
Calcium: 9.3 mg/dL (ref 8.6–10.2)
Chloride: 102 mmol/L (ref 96–106)
Creatinine, Ser: 1.17 mg/dL (ref 0.76–1.27)
Glucose: 82 mg/dL (ref 70–99)
Potassium: 4.5 mmol/L (ref 3.5–5.2)
Sodium: 141 mmol/L (ref 134–144)
eGFR: 63 mL/min/{1.73_m2} (ref >59)

## 2023-12-24 ENCOUNTER — Telehealth: Payer: Self-pay | Admitting: *Deleted

## 2023-12-24 NOTE — Telephone Encounter (Signed)
 Cardiac Catheterization scheduled at [facility] for: Thursday December 25, 2023 9 AM Arrival time [facility] Main Entrance A at: 7 AM  Nothing to eat after midnight prior to procedure, clear liquids until 5 AM day of procedure.  Medication instructions: -Usual morning medications can be taken with sips of water  including aspirin  81 mg.  Plan to go home the same day, you will only stay overnight if medically necessary.  You must have responsible adult to drive you home.  Someone must be with you the first 24 hours after you arrive home.  Reviewed procedure instructions with patient's wife (DPR).

## 2023-12-25 ENCOUNTER — Other Ambulatory Visit: Payer: Self-pay

## 2023-12-25 ENCOUNTER — Encounter (HOSPITAL_COMMUNITY): Payer: Self-pay | Admitting: Cardiovascular Disease

## 2023-12-25 ENCOUNTER — Encounter (HOSPITAL_COMMUNITY)
Admission: RE | Disposition: A | Discharge status: Home / Self Care | Payer: Self-pay | Source: Ambulatory Visit | Attending: Cardiovascular Disease

## 2023-12-25 ENCOUNTER — Other Ambulatory Visit: Payer: Self-pay | Admitting: Physician Assistant

## 2023-12-25 ENCOUNTER — Other Ambulatory Visit (HOSPITAL_COMMUNITY): Payer: Self-pay

## 2023-12-25 ENCOUNTER — Ambulatory Visit (HOSPITAL_COMMUNITY)
Admission: RE | Admit: 2023-12-25 | Discharge: 2023-12-25 | Disposition: A | Discharge status: Home / Self Care | Payer: PPO | Source: Ambulatory Visit | Attending: Cardiovascular Disease | Admitting: Cardiovascular Disease

## 2023-12-25 ENCOUNTER — Encounter: Payer: Self-pay | Admitting: Physician Assistant

## 2023-12-25 DIAGNOSIS — Z7902 Long term (current) use of antithrombotics/antiplatelets: Secondary | ICD-10-CM | POA: Insufficient documentation

## 2023-12-25 DIAGNOSIS — E039 Hypothyroidism, unspecified: Secondary | ICD-10-CM | POA: Insufficient documentation

## 2023-12-25 DIAGNOSIS — I2510 Atherosclerotic heart disease of native coronary artery without angina pectoris: Secondary | ICD-10-CM

## 2023-12-25 DIAGNOSIS — Z8709 Personal history of other diseases of the respiratory system: Secondary | ICD-10-CM

## 2023-12-25 DIAGNOSIS — I350 Nonrheumatic aortic (valve) stenosis: Secondary | ICD-10-CM

## 2023-12-25 DIAGNOSIS — Z8669 Personal history of other diseases of the nervous system and sense organs: Secondary | ICD-10-CM

## 2023-12-25 DIAGNOSIS — Z7982 Long term (current) use of aspirin: Secondary | ICD-10-CM | POA: Insufficient documentation

## 2023-12-25 DIAGNOSIS — C679 Malignant neoplasm of bladder, unspecified: Secondary | ICD-10-CM | POA: Diagnosis present

## 2023-12-25 DIAGNOSIS — Z8551 Personal history of malignant neoplasm of bladder: Secondary | ICD-10-CM | POA: Insufficient documentation

## 2023-12-25 DIAGNOSIS — Z79899 Other long term (current) drug therapy: Secondary | ICD-10-CM | POA: Insufficient documentation

## 2023-12-25 DIAGNOSIS — Z955 Presence of coronary angioplasty implant and graft: Secondary | ICD-10-CM | POA: Diagnosis not present

## 2023-12-25 DIAGNOSIS — E785 Hyperlipidemia, unspecified: Secondary | ICD-10-CM | POA: Insufficient documentation

## 2023-12-25 DIAGNOSIS — I6523 Occlusion and stenosis of bilateral carotid arteries: Secondary | ICD-10-CM

## 2023-12-25 HISTORY — DX: Nonrheumatic aortic (valve) stenosis: I35.0

## 2023-12-25 HISTORY — PX: CORONARY STENT INTERVENTION: CATH118234

## 2023-12-25 HISTORY — DX: Atherosclerotic heart disease of native coronary artery without angina pectoris: I25.10

## 2023-12-25 HISTORY — PX: RIGHT HEART CATH AND CORONARY ANGIOGRAPHY: CATH118264

## 2023-12-25 LAB — POCT I-STAT 7, (LYTES, BLD GAS, ICA,H+H)
Acid-base deficit: 3 mmol/L — ABNORMAL HIGH (ref 0.0–2.0)
Bicarbonate: 20.7 mmol/L (ref 20.0–28.0)
Calcium, Ion: 0.95 mmol/L — ABNORMAL LOW (ref 1.15–1.40)
HCT: 33 % — ABNORMAL LOW (ref 39.0–52.0)
Hemoglobin: 11.2 g/dL — ABNORMAL LOW (ref 13.0–17.0)
O2 Saturation: 94 %
Potassium: 3.1 mmol/L — ABNORMAL LOW (ref 3.5–5.1)
Sodium: 144 mmol/L (ref 135–145)
TCO2: 22 mmol/L (ref 22–32)
pCO2 arterial: 32.9 mm[Hg] (ref 32–48)
pH, Arterial: 7.405 (ref 7.35–7.45)
pO2, Arterial: 68 mm[Hg] — ABNORMAL LOW (ref 83–108)

## 2023-12-25 LAB — POCT ACTIVATED CLOTTING TIME: Activated Clotting Time: 343 s

## 2023-12-25 LAB — POCT I-STAT EG7
Acid-base deficit: 1 mmol/L (ref 0.0–2.0)
Bicarbonate: 24.5 mmol/L (ref 20.0–28.0)
Calcium, Ion: 1.14 mmol/L — ABNORMAL LOW (ref 1.15–1.40)
HCT: 36 % — ABNORMAL LOW (ref 39.0–52.0)
Hemoglobin: 12.2 g/dL — ABNORMAL LOW (ref 13.0–17.0)
O2 Saturation: 70 %
Potassium: 3.6 mmol/L (ref 3.5–5.1)
Sodium: 141 mmol/L (ref 135–145)
TCO2: 26 mmol/L (ref 22–32)
pCO2, Ven: 41.2 mm[Hg] — ABNORMAL LOW (ref 44–60)
pH, Ven: 7.382 (ref 7.25–7.43)
pO2, Ven: 37 mm[Hg] (ref 32–45)

## 2023-12-25 SURGERY — RIGHT HEART CATH AND CORONARY ANGIOGRAPHY
Anesthesia: LOCAL

## 2023-12-25 MED ORDER — LIDOCAINE HCL (PF) 1 % IJ SOLN
INTRAMUSCULAR | Status: DC | PRN
  Administered 2023-12-25: 2 mL

## 2023-12-25 MED ORDER — SODIUM CHLORIDE 0.9 % IV SOLN: INTRAVENOUS | Status: DC

## 2023-12-25 MED ORDER — ASPIRIN 81 MG PO CHEW: 81.0000 mg | CHEWABLE_TABLET | ORAL | Status: DC

## 2023-12-25 MED ORDER — SODIUM CHLORIDE 0.9% FLUSH: 3.0000 mL | Freq: Two times a day (BID) | INTRAVENOUS | Status: DC

## 2023-12-25 MED ORDER — MIDAZOLAM HCL 2 MG/2ML IJ SOLN
INTRAMUSCULAR | Status: DC | PRN
  Administered 2023-12-25 (×2): 1 mg via INTRAVENOUS

## 2023-12-25 MED ORDER — CLOPIDOGREL BISULFATE 75 MG PO TABS: 75.0000 mg | ORAL_TABLET | Freq: Every day | ORAL | Status: DC

## 2023-12-25 MED ORDER — CLOPIDOGREL BISULFATE 75 MG PO TABS
75.0000 mg | ORAL_TABLET | Freq: Every day | ORAL | 6 refills | Status: DC
  Filled 2023-12-25: qty 30, 30d supply, fill #0

## 2023-12-25 MED ORDER — SODIUM CHLORIDE 0.9% FLUSH: 3.0000 mL | INTRAVENOUS | Status: DC | PRN

## 2023-12-25 MED ORDER — FAMOTIDINE IN NACL 20-0.9 MG/50ML-% IV SOLN
INTRAVENOUS | Status: AC
  Filled 2023-12-25: qty 50

## 2023-12-25 MED ORDER — MIDAZOLAM HCL 2 MG/2ML IJ SOLN
INTRAMUSCULAR | Status: AC
  Filled 2023-12-25: qty 2

## 2023-12-25 MED ORDER — FENTANYL CITRATE (PF) 100 MCG/2ML IJ SOLN
INTRAMUSCULAR | Status: AC
  Filled 2023-12-25: qty 2

## 2023-12-25 MED ORDER — LEVOTHYROXINE SODIUM 137 MCG PO TABS: 137.0000 ug | ORAL_TABLET | Freq: Every day | ORAL | Status: DC

## 2023-12-25 MED ORDER — HEPARIN (PORCINE) IN NACL 1000-0.9 UT/500ML-% IV SOLN
INTRAVENOUS | Status: DC | PRN
  Administered 2023-12-25 (×2): 500 mL

## 2023-12-25 MED ORDER — ONDANSETRON HCL 4 MG/2ML IJ SOLN: 4.0000 mg | Freq: Four times a day (QID) | INTRAMUSCULAR | Status: DC | PRN

## 2023-12-25 MED ORDER — VERAPAMIL HCL 2.5 MG/ML IV SOLN
INTRAVENOUS | Status: DC | PRN
  Administered 2023-12-25: 10 mL via INTRA_ARTERIAL

## 2023-12-25 MED ORDER — LIDOCAINE HCL (PF) 1 % IJ SOLN
INTRAMUSCULAR | Status: AC
  Filled 2023-12-25: qty 30

## 2023-12-25 MED ORDER — FAMOTIDINE IN NACL 20-0.9 MG/50ML-% IV SOLN
INTRAVENOUS | Status: DC | PRN
  Administered 2023-12-25: 20 mg via INTRAVENOUS

## 2023-12-25 MED ORDER — ATORVASTATIN CALCIUM 40 MG PO TABS: 40.0000 mg | ORAL_TABLET | Freq: Every day | ORAL | Status: DC

## 2023-12-25 MED ORDER — LABETALOL HCL 5 MG/ML IV SOLN: 10.0000 mg | INTRAVENOUS | Status: DC | PRN

## 2023-12-25 MED ORDER — HYDRALAZINE HCL 20 MG/ML IJ SOLN: 10.0000 mg | INTRAMUSCULAR | Status: DC | PRN

## 2023-12-25 MED ORDER — VERAPAMIL HCL 2.5 MG/ML IV SOLN
INTRAVENOUS | Status: AC
  Filled 2023-12-25: qty 2

## 2023-12-25 MED ORDER — SODIUM CHLORIDE 0.9 % WEIGHT BASED INFUSION: 1.0000 mL/kg/h | INTRAVENOUS | Status: DC

## 2023-12-25 MED ORDER — LORATADINE 10 MG PO TABS: 10.0000 mg | ORAL_TABLET | Freq: Every day | ORAL | Status: DC

## 2023-12-25 MED ORDER — IOHEXOL 350 MG/ML SOLN
INTRAVENOUS | Status: DC | PRN
  Administered 2023-12-25: 80 mL

## 2023-12-25 MED ORDER — SODIUM CHLORIDE 0.9 % IV SOLN
250.0000 mL | INTRAVENOUS | Status: DC | PRN
Start: 2023-12-25 — End: 2023-12-25

## 2023-12-25 MED ORDER — CLOPIDOGREL BISULFATE 300 MG PO TABS
ORAL_TABLET | ORAL | Status: DC | PRN
  Administered 2023-12-25: 600 mg via ORAL

## 2023-12-25 MED ORDER — HEPARIN SODIUM (PORCINE) 1000 UNIT/ML IJ SOLN
INTRAMUSCULAR | Status: DC | PRN
  Administered 2023-12-25: 10000 [IU] via INTRAVENOUS

## 2023-12-25 MED ORDER — HEPARIN SODIUM (PORCINE) 1000 UNIT/ML IJ SOLN
INTRAMUSCULAR | Status: AC
  Filled 2023-12-25: qty 10

## 2023-12-25 MED ORDER — ASPIRIN 81 MG PO TBEC: 81.0000 mg | DELAYED_RELEASE_TABLET | Freq: Every day | ORAL | Status: DC

## 2023-12-25 MED ORDER — FENTANYL CITRATE (PF) 100 MCG/2ML IJ SOLN
INTRAMUSCULAR | Status: DC | PRN
  Administered 2023-12-25 (×2): 25 ug via INTRAVENOUS

## 2023-12-25 MED ORDER — SODIUM CHLORIDE 0.9 % WEIGHT BASED INFUSION: 3.0000 mL/kg/h | INTRAVENOUS | Status: DC

## 2023-12-25 MED ORDER — ACETAMINOPHEN 325 MG PO TABS: 650.0000 mg | ORAL_TABLET | ORAL | Status: DC | PRN

## 2023-12-25 SURGICAL SUPPLY — 19 items
BALL SAPPHIRE NC24 3.0X12 (BALLOONS) ×1 IMPLANT
BALLN EMERGE MR 2.0X12 (BALLOONS) ×1 IMPLANT
BALLOON EMERGE MR 2.0X12 (BALLOONS) IMPLANT
BALLOON SAPPHIRE NC24 3.0X12 (BALLOONS) IMPLANT
CATH 5FR JL3.5 JR4 ANG PIG MP (CATHETERS) IMPLANT
CATH BALLN WEDGE 5F 110CM (CATHETERS) IMPLANT
CATH VISTA GUIDE 6FR 3DRC (CATHETERS) IMPLANT
DEVICE RAD COMP TR BAND LRG (VASCULAR PRODUCTS) IMPLANT
GLIDESHEATH SLEND SS 6F .021 (SHEATH) IMPLANT
GUIDEWIRE INQWIRE 1.5J.035X260 (WIRE) IMPLANT
INQWIRE 1.5J .035X260CM (WIRE) ×1 IMPLANT
KIT ENCORE 26 ADVANTAGE (KITS) IMPLANT
KIT SINGLE USE MANIFOLD (KITS) IMPLANT
PACK CARDIAC CATHETERIZATION (CUSTOM PROCEDURE TRAY) ×2 IMPLANT
SET ATX-X65L (MISCELLANEOUS) IMPLANT
SHEATH GLIDE SLENDER 4/5FR (SHEATH) IMPLANT
SHEATH PROBE COVER 6X72 (BAG) IMPLANT
STENT ONYX FRONTIER 2.75X15 (Permanent Stent) IMPLANT
WIRE ASAHI PROWATER 180CM (WIRE) IMPLANT

## 2023-12-25 NOTE — Progress Notes (Signed)
Pt was educated on stent card, stent location, Antiplatelet and ASA use, wt restrictions, no baths/daily wash-ups, s/s of infection, ex guidelines, s/s to stop exercising, NTG use and calling 911, heart healthy diet, risk factors and CRPII. Pt received materials on exercise, diet, and CRPII. Will refer to AP.   [id number]  Faustino Congress MS, ACSM-CEP 12/25/2023 1:40 PM

## 2023-12-25 NOTE — Discharge Summary (Addendum)
[facility]   MULTIDISCIPLINARY HEART VALVE TEAM  Discharge Summary    Patient ID: Anthony Noble [facility]. MRN: [id number]; DOB: 1941-09-26  Admit date: 12/25/2023 Discharge date: 12/25/2023  Primary Care Provider: Dettinger, Elige Radon, MD  Primary Cardiologist: Reatha Harps, MD   Discharge Diagnoses    Principal Problem:   CAD S/P percutaneous coronary angioplasty Active Problems:   Hypothyroidism   Malignant neoplasm of urinary bladder (HCC)   History of Guillain-Barre syndrome   Hyperlipidemia   History of pleural effusion   Severe aortic stenosis   Bilateral carotid artery stenosis without cerebral infarction   Allergies Allergies  Allergen Reactions   Bcg Live Other (See Comments)    Treatment pt had- shakes   Septra [Bactrim] Other (See Comments)    Muscle weakness, numbness   Sulfa Antibiotics Other (See Comments)    Severe weakness   Pertussis Vaccines     History of Janne Napoleon barrie     Diagnostic Studies/Procedures    [facility] 12/25/23 Procedures RIGHT HEART CATH AND CORONARY ANGIOGRAPHY  CORONARY STENT INTERVENTION   Conclusion    Prox RCA lesion is 99% stenosed.   Mid Cx to Dist Cx lesion is 30% stenosed.   Mid LM to Dist LM lesion is 30% stenosed.   Prox LAD to Mid LAD lesion is 30% stenosed.   Mid RCA lesion is 30% stenosed.   A drug-eluting stent was successfully placed using a STENT ONYX FRONTIER [id number].   Post intervention, there is a 0% residual stenosis.   Severe stenosis in the proximal RCA (co-dominant vessel) Successful PTCA/DES x 1 proximal RCA Mild non-obstructive disease in the distal left main, proximal LAD and mid Circumflex Normal right heart pressures.  Severe aortic stenosis by echo. I did not cross the valve today.    Recommendations: Will continue DAPT with ASA and Plavix. Same day post PCI discharge (6 hours). Continue workup for TAVR.     History of Present Illness     Anthony Noble. is a 83 y.o.  male with a history of bladder cancer, remote pleural effusion s/p remote VATs (2001), HLD, carotid artery disease and severe aortic stenosis who presented to [facility] on 12/25/23 for diagnostic L/RHC as a part of the TAVR work up.  Echo 05/2023 showed LVEF 60-65% and severe aortic stenosis with mean gradient 53 mmHg, AVA 0.7 cm2, DI 0.22 as well as mild AI. He was seen on 06/19/23 by Dr. Flora Lipps and reported feeling well overall but given very severe AS, Dr. Flora Lipps felt that we should proceed with planning for TAVR. He was seen by Dr. Clifton James in the office on 06/30/23 and reported remaining very active with no symptoms and they elected for continued surveillance. Repeat echo 11/25/23 showed EF 70%, moderate LVH and severe AS with a mean gradient of 54.5 mmhg, aortic Vmax 4.78m/s, AVA 0.66cm2, DVI 0.2 and mild AI. Despite continued lack of symptoms it was felt prudent to start the TAVR work up to avoid heart failure and other adverse outcomes. He was set up for [facility] with Dr. Clifton James on 12/25/23.  Hospital Course     Consultants: none  CAD: diagnostic L/RHC today revealed severe stenosis in the proximal RCA (co-dominant vessel) s/p successful PTCA/DES x 1 proximal RCA. There was mild non-obstructive disease in the distal left main, proximal LAD and mid Circumflex and normal right heart pressures. Continue aspirin 81 mg daily. Started on Plavix 75mg  daily to be continued for 6 months (called into  TOC pharmacy). No SL NTG given severe AS. Continue atorvastatin 40mg  daily. Radial site stable. Plan same day discharge today with follow up post PCI with Carlean Jews PA-C on 01/07/24.   Severe AS: currently in TAVR work up. Plan for CT scans on 01/02/24 and apt with Dr. Leafy Ro on 01/22/24. CT instruction letter given to pt.   Elevated BP without a history of HTN: will have him watch his BPs and start meds in outpatient setting if indicated.    _____________  Discharge Vitals Blood pressure (!) 125/57, pulse 66,  temperature 97.8 F (36.6 C), resp. rate 12, height 5\' 9"  (1.753 m), weight 78.9 kg, SpO2 99%.  Filed Weights   12/25/23 0717  Weight: 78.9 kg     GEN: Well nourished, well developed, in no acute distress HEENT: normal Neck: no JVD or masses Cardiac: RRR; 3/6 harsh SEM heard best at RUSB. No rubs, or gallops,no edema  Respiratory:  clear to auscultation bilaterally, normal work of breathing GI: soft, nontender, nondistended, + BS MS: no deformity or atrophy Skin: warm and dry, no rash. Radial site stable.  Neuro:  Alert and Oriented x 3, Strength and sensation are intact Psych: euthymic mood, full affect    Disposition   Pt is being discharged home today in good condition.  Follow-up Plans & Appointments     Follow-up Information     Janetta Hora, PA-C. Go on 01/07/2024.   Specialties: Cardiology, Radiology Why: @ 2:45pm, please arrive at least 15 min early Contact information: [address] [place] [address] [phone]                Discharge Instructions     Amb Referral to Cardiac Rehabilitation   Complete by: As directed    Diagnosis: Coronary Stents   After initial evaluation and assessments completed: Virtual Based Care may be provided alone or in conjunction with Phase 2 Cardiac Rehab based on patient barriers.: Yes   Intensive Cardiac Rehabilitation (ICR) [facility] location only OR Traditional Cardiac Rehabilitation (TCR) *If criteria for ICR are not met will enroll in TCR [facility] only): Yes       Discharge Medications   Allergies as of 12/25/2023       Reactions   Bcg Live Other (See Comments)   Treatment pt had- shakes   Septra [bactrim] Other (See Comments)   Muscle weakness, numbness   Sulfa Antibiotics Other (See Comments)   Severe weakness   Pertussis Vaccines    History of Gillian barrie        Medication List     PAUSE taking these medications    ibuprofen 200 MG tablet Wait to take this until your  doctor or other care provider tells you to start again. Commonly known as: ADVIL Take 400 mg by mouth daily as needed for moderate pain (pain score 4-6).       TAKE these medications    aspirin EC 81 MG tablet Take 81 mg by mouth daily. Swallow whole.   atorvastatin 40 MG tablet Commonly known as: LIPITOR Take 1 tablet (40 mg total) by mouth daily.   brimonidine 0.2 % ophthalmic solution Commonly known as: ALPHAGAN Place 1 drop into the right eye 3 (three) times daily.   cholecalciferol 25 MCG (1000 UNIT) tablet Commonly known as: VITAMIN D3 Take 1,000 Units by mouth daily.   clopidogrel 75 MG tablet Commonly known as: PLAVIX Take 1 tablet (75 mg total) by mouth daily. Start taking on:  December 26, 2023   CoQ10 200 MG Caps Take 200 mg by mouth daily.   Cranberry 500 MG Caps Take 500 mg by mouth daily.   dorzolamide-timolol 2-0.5 % ophthalmic solution Commonly known as: COSOPT Place 1 drop into the right eye 2 (two) times daily.   ferrous sulfate 325 (65 FE) MG tablet Take 325 mg by mouth daily.   Fish Oil 1000 MG Caps Take 1,000 mg by mouth daily.   HAIR SKIN NAILS PO Take 1 tablet by mouth daily.   hydrocortisone 2.5 % ointment Apply 1 Application topically daily after supper. Apply to both eyelids   hyoscyamine 0.125 MG SL tablet Commonly known as: LEVSIN SL DISSOLVE 1 TABLET IN MOUTH EVERY 8 HOURS AS NEEDED What changed: See the new instructions.   levothyroxine 137 MCG tablet Commonly known as: SYNTHROID Take 1 tablet (137 mcg total) by mouth daily before breakfast.   loratadine 10 MG tablet Commonly known as: CLARITIN Take 10 mg by mouth daily.   Magnesium 400 MG Tabs Take 400 mg by mouth daily.   multivitamin with minerals Tabs tablet Take 1 tablet by mouth daily. Centrum silver   NON FORMULARY Take 10 mg by mouth at bedtime. CBD Gummy hemp isolate   NON FORMULARY Take 2 tablets by mouth 3 (three) times daily. Flavonoid   Rocklatan  0.02-0.005 % Soln Generic drug: Netarsudil-Latanoprost Place 1 drop into both eyes at bedtime.   sodium chloride 0.65 % Soln nasal spray Commonly known as: OCEAN Place 1 spray into both nostrils daily. May continue to use throughout the day as needed for congestion   Tums Ultra 1000 1000 MG chewable tablet Generic drug: calcium elemental as carbonate Chew 1,000 mg by mouth at bedtime.          Outstanding Labs/Studies   none  Duration of Discharge Encounter   Greater than 30 minutes including physician time.  SignedCline Crock, PA-C 12/25/2023, 1:48 PM [phone]

## 2023-12-25 NOTE — Discharge Instructions (Signed)

## 2023-12-25 NOTE — Interval H&P Note (Signed)
History and Physical Interval Note:  12/25/2023 7:34 AM  [name]  has presented today for surgery, with the diagnosis of chest pain.  The various methods of treatment have been discussed with the patient and family. After consideration of risks, benefits and other options for treatment, the patient has consented to  Procedure(s): RIGHT/LEFT HEART CATH AND CORONARY/GRAFT ANGIOGRAPHY (N/A) as a surgical intervention.  The patient's history has been reviewed, patient examined, no change in status, stable for surgery.  I have reviewed the patient's chart and labs.  Questions were answered to the patient's satisfaction.    Cath Lab Visit (complete for each Cath Lab visit)  Clinical Evaluation Leading to the Procedure:   ACS: No.  Non-ACS:    Anginal Classification: No Symptoms  Anti-ischemic medical therapy: No Therapy  Non-Invasive Test Results: No non-invasive testing performed  Prior CABG: No previous CABG        Verne Carrow

## 2023-12-25 NOTE — Progress Notes (Signed)
TR BAND REMOVAL  LOCATION:    right radial  DEFLATED PER PROTOCOL:    Yes.    TIME BAND OFF / DRESSING APPLIED:    1430 gauze dressing applied   SITE UPON ARRIVAL:    Level 0  SITE AFTER BAND REMOVAL:    Level 0  CIRCULATION SENSATION AND MOVEMENT:    Within Normal Limits   Yes.    COMMENTS:   no issues noted

## 2023-12-29 ENCOUNTER — Other Ambulatory Visit: Payer: Self-pay

## 2023-12-29 DIAGNOSIS — E039 Hypothyroidism, unspecified: Secondary | ICD-10-CM

## 2023-12-29 DIAGNOSIS — E7800 Pure hypercholesterolemia, unspecified: Secondary | ICD-10-CM

## 2023-12-30 ENCOUNTER — Other Ambulatory Visit: Payer: PPO

## 2023-12-30 DIAGNOSIS — E039 Hypothyroidism, unspecified: Secondary | ICD-10-CM | POA: Diagnosis not present

## 2023-12-30 DIAGNOSIS — E7800 Pure hypercholesterolemia, unspecified: Secondary | ICD-10-CM

## 2023-12-31 LAB — CBC WITH DIFFERENTIAL/PLATELET
Basophils Absolute: 0.1 10*3/uL (ref 0.0–0.2)
Basos: 1 %
EOS (ABSOLUTE): 0.2 10*3/uL (ref 0.0–0.4)
Eos: 3 %
Hematocrit: 42.5 % (ref 37.5–51.0)
Hemoglobin: 13.7 g/dL (ref 13.0–17.7)
Immature Grans (Abs): 0 10*3/uL (ref 0.0–0.1)
Immature Granulocytes: 0 %
Lymphocytes Absolute: 1.4 10*3/uL (ref 0.7–3.1)
Lymphs: 19 %
MCH: 29.4 pg (ref 26.6–33.0)
MCHC: 32.2 g/dL (ref 31.5–35.7)
MCV: 91 fL (ref 79–97)
Monocytes Absolute: 0.6 10*3/uL (ref 0.1–0.9)
Monocytes: 9 %
Neutrophils Absolute: 5.2 10*3/uL (ref 1.4–7.0)
Neutrophils: 68 %
Platelets: 222 10*3/uL (ref 150–450)
RBC: 4.66 x10E6/uL (ref 4.14–5.80)
RDW: 12.9 % (ref 11.6–15.4)
WBC: 7.5 10*3/uL (ref 3.4–10.8)

## 2023-12-31 LAB — CMP14+EGFR
ALT: 18 IU/L (ref 0–44)
AST: 23 IU/L (ref 0–40)
Albumin: 4.3 g/dL (ref 3.7–4.7)
Alkaline Phosphatase: 71 IU/L (ref 44–121)
BUN/Creatinine Ratio: 14 (ref 10–24)
BUN: 16 mg/dL (ref 8–27)
Bilirubin Total: 0.4 mg/dL (ref 0.0–1.2)
CO2: 27 mmol/L (ref 20–29)
Calcium: 9.4 mg/dL (ref 8.6–10.2)
Chloride: 104 mmol/L (ref 96–106)
Creatinine, Ser: 1.17 mg/dL (ref 0.76–1.27)
Globulin, Total: 2.2 g/dL (ref 1.5–4.5)
Glucose: 81 mg/dL (ref 70–99)
Potassium: 4.3 mmol/L (ref 3.5–5.2)
Sodium: 144 mmol/L (ref 134–144)
Total Protein: 6.5 g/dL (ref 6.0–8.5)
eGFR: 63 mL/min/{1.73_m2} (ref >59)

## 2023-12-31 LAB — TSH: TSH: 4.2 u[IU]/mL (ref 0.450–4.500)

## 2024-01-01 ENCOUNTER — Ambulatory Visit: Payer: PPO | Admitting: Family Medicine

## 2024-01-01 ENCOUNTER — Encounter: Payer: Self-pay | Admitting: Family Medicine

## 2024-01-01 VITALS — BP 121/70 | HR 62 | Ht 68.0 in | Wt 180.0 lb

## 2024-01-01 DIAGNOSIS — E7800 Pure hypercholesterolemia, unspecified: Secondary | ICD-10-CM | POA: Diagnosis not present

## 2024-01-01 DIAGNOSIS — R351 Nocturia: Secondary | ICD-10-CM | POA: Diagnosis not present

## 2024-01-01 DIAGNOSIS — N401 Enlarged prostate with lower urinary tract symptoms: Secondary | ICD-10-CM

## 2024-01-01 DIAGNOSIS — E039 Hypothyroidism, unspecified: Secondary | ICD-10-CM | POA: Diagnosis not present

## 2024-01-01 NOTE — Progress Notes (Signed)
BP 121/70   Pulse 62   Ht 5\' 8"  (1.727 m)   Wt 180 lb (81.6 kg)   SpO2 97%   BMI 27.37 kg/m    Subjective:   Patient ID: Anthony Antigua., male    DOB: August 23, 1941, 83 y.o.   MRN: [id number]  HPI: Anthony Shreffler. is a 83 y.o. male presenting on 01/01/2024 for Medical Management of Chronic Issues, Hyperlipidemia, and Hypertension  Hyperlipidemia Patient is currently taking atorvastatin. He denies myalgias or weakness. He does not have a history of liver damage from it.   Hypothyroidism recheck Patient is currently taking levothyroxine 125 mcg. He reports some cold intolerance, particularly in his hands, but otherwise denies any changes in hair or skin, heat intolerance, diarrhea or constipation, and chest pain or palpitations.   BPH Patient is not currently taking any medications. He reports urinary hesitancy and nocturia (2-3 times per night). He denies dysuria, hematuria, urinary urgency, and decreased urine.  Tinnitus/Hearing loss Patient reports chronic tinnitus. The tinnitus is louder than most white noise machines, so he typically leaves the TV on to help distract his brain. He has been evaluated by audiology, which showed high frequency hearing loss. He does not wear hearing aids. The tinnitus is most noticeable when he wakes up in the middle of the night, which makes it difficult to fall back asleep.  Patient also recently underwent heart catheterization with DES to proximal right coronary artery. He was asymptomatic and denies chest pain or shortness of breath with  exertion. He was started on clopidogrel.  Relevant past medical, surgical, family and social history reviewed and updated as indicated. Interim medical history since our last visit reviewed. Allergies and medications reviewed and updated.  Review of Systems  Constitutional:  Negative for chills and fever.  HENT:  Positive for hearing loss and tinnitus. Negative for congestion and sinus pressure.    Eyes:  Negative for visual disturbance.  Respiratory:  Negative for cough, chest tightness and shortness of breath.   Cardiovascular:  Negative for chest pain, palpitations and leg swelling.  Gastrointestinal:  Negative for abdominal distention, abdominal pain, constipation and diarrhea.  Endocrine: Negative for polyuria.  Genitourinary:  Positive for difficulty urinating (urinary hesitancy) and frequency (nocturia 2-3 times per night). Negative for decreased urine volume, dysuria, hematuria and urgency.  Musculoskeletal:  Positive for back pain. Negative for myalgias.  Skin:  Negative for rash.  Neurological:  Negative for dizziness, syncope, weakness, light-headedness and headaches.  Psychiatric/Behavioral:  Negative for sleep disturbance.     Per HPI unless specifically indicated above   Allergies as of 01/01/2024       Reactions   Bcg Live Other (See Comments)   Treatment pt had- shakes   Septra [bactrim] Other (See Comments)   Muscle weakness, numbness   Sulfa Antibiotics Other (See Comments)   Severe weakness   Pertussis Vaccines    History of Gillian barrie        Medication List        Accurate as of January 01, 2024 11:35 AM. If you have any questions, ask your nurse or doctor.          PAUSE taking these medications    ibuprofen 200 MG tablet Wait to take this until your doctor or other care provider tells you to start again. Commonly known as: ADVIL Take 400 mg by mouth daily as needed for moderate pain (pain score 4-6).       TAKE these  medications    aspirin EC 81 MG tablet Take 81 mg by mouth daily. Swallow whole.   atorvastatin 40 MG tablet Commonly known as: LIPITOR Take 1 tablet (40 mg total) by mouth daily.   brimonidine 0.2 % ophthalmic solution Commonly known as: ALPHAGAN Place 1 drop into the right eye 3 (three) times daily.   cholecalciferol 25 MCG (1000 UNIT) tablet Commonly known as: VITAMIN D3 Take 1,000 Units by mouth  daily.   clopidogrel 75 MG tablet Commonly known as: PLAVIX Take 1 tablet (75 mg total) by mouth daily.   CoQ10 200 MG Caps Take 200 mg by mouth daily.   Cranberry 500 MG Caps Take 500 mg by mouth daily.   dorzolamide-timolol 2-0.5 % ophthalmic solution Commonly known as: COSOPT Place 1 drop into the right eye 2 (two) times daily.   ferrous sulfate 325 (65 FE) MG tablet Take 325 mg by mouth daily.   Fish Oil 1000 MG Caps Take 1,000 mg by mouth daily.   HAIR SKIN NAILS PO Take 1 tablet by mouth daily.   hydrocortisone 2.5 % ointment Apply 1 Application topically daily after supper. Apply to both eyelids   hyoscyamine 0.125 MG SL tablet Commonly known as: LEVSIN SL DISSOLVE 1 TABLET IN MOUTH EVERY 8 HOURS AS NEEDED What changed: See the new instructions.   levothyroxine 137 MCG tablet Commonly known as: SYNTHROID Take 1 tablet (137 mcg total) by mouth daily before breakfast.   loratadine 10 MG tablet Commonly known as: CLARITIN Take 10 mg by mouth daily.   Magnesium 400 MG Tabs Take 400 mg by mouth daily.   multivitamin with minerals Tabs tablet Take 1 tablet by mouth daily. Centrum silver   NON FORMULARY Take 10 mg by mouth at bedtime. CBD Gummy hemp isolate   NON FORMULARY Take 2 tablets by mouth 3 (three) times daily. Flavonoid   Rocklatan 0.02-0.005 % Soln Generic drug: Netarsudil-Latanoprost Place 1 drop into both eyes at bedtime.   sodium chloride 0.65 % Soln nasal spray Commonly known as: OCEAN Place 1 spray into both nostrils daily. May continue to use throughout the day as needed for congestion   Tums Ultra 1000 1000 MG chewable tablet Generic drug: calcium elemental as carbonate Chew 1,000 mg by mouth at bedtime.         Objective:   BP 121/70   Pulse 62   Ht 5\' 8"  (1.727 m)   Wt 180 lb (81.6 kg)   SpO2 97%   BMI 27.37 kg/m   Wt Readings from Last 3 Encounters:  01/01/24 180 lb (81.6 kg)  12/25/23 174 lb (78.9 kg)  12/18/23 180  lb (81.6 kg)    Physical Exam Vitals and nursing note reviewed.  Constitutional:      Appearance: Normal appearance. He is normal weight.  HENT:     Head: Normocephalic and atraumatic.     Right Ear: External ear normal.     Left Ear: External ear normal.  Eyes:     Conjunctiva/sclera: Conjunctivae normal.  Cardiovascular:     Rate and Rhythm: Normal rate and regular rhythm.     Heart sounds: Murmur (4/6 systolic crescendo-decrescendo murmur loudest at RUSB with radiation to carotids) heard.  Pulmonary:     Effort: Pulmonary effort is normal.     Breath sounds: Normal breath sounds. No wheezing or rales.  Abdominal:     General: Abdomen is flat. There is no distension.     Palpations: Abdomen is soft.  Tenderness: There is no abdominal tenderness. There is no right CVA tenderness or left CVA tenderness.  Musculoskeletal:     Cervical back: Normal range of motion and neck supple. No tenderness.     Right lower leg: No edema.     Left lower leg: No edema.  Skin:    General: Skin is warm and dry.  Neurological:     Mental Status: He is alert and oriented to person, place, and time.  Psychiatric:        Mood and Affect: Mood normal.        Behavior: Behavior normal.        Thought Content: Thought content normal.        Judgment: Judgment normal.    Results for orders placed or performed in visit on 12/30/23  CMP14+EGFR   Collection Time: 12/30/23 10:02 AM  Result Value Ref Range   Glucose 81 70 - 99 mg/dL   BUN 16 8 - 27 mg/dL   Creatinine, Ser 1.61 0.76 - 1.27 mg/dL   eGFR 63 >09 UE/AVW/0.98   BUN/Creatinine Ratio 14 10 - 24   Sodium 144 134 - 144 mmol/L   Potassium 4.3 3.5 - 5.2 mmol/L   Chloride 104 96 - 106 mmol/L   CO2 27 20 - 29 mmol/L   Calcium 9.4 8.6 - 10.2 mg/dL   Total Protein 6.5 6.0 - 8.5 g/dL   Albumin 4.3 3.7 - 4.7 g/dL   Globulin, Total 2.2 1.5 - 4.5 g/dL   Bilirubin Total 0.4 0.0 - 1.2 mg/dL   Alkaline Phosphatase 71 44 - 121 IU/L   AST 23 0 -  40 IU/L   ALT 18 0 - 44 IU/L  TSH   Collection Time: 12/30/23 10:02 AM  Result Value Ref Range   TSH 4.200 0.450 - 4.500 uIU/mL  CBC with Differential/Platelet   Collection Time: 12/30/23 10:02 AM  Result Value Ref Range   WBC 7.5 3.4 - 10.8 x10E3/uL   RBC 4.66 4.14 - 5.80 x10E6/uL   Hemoglobin 13.7 13.0 - 17.7 g/dL   Hematocrit 11.9 14.7 - 51.0 %   MCV 91 79 - 97 fL   MCH 29.4 26.6 - 33.0 pg   MCHC 32.2 31.5 - 35.7 g/dL   RDW 82.9 56.2 - 13.0 %   Platelets 222 150 - 450 x10E3/uL   Neutrophils 68 Not Estab. %   Lymphs 19 Not Estab. %   Monocytes 9 Not Estab. %   Eos 3 Not Estab. %   Basos 1 Not Estab. %   Neutrophils Absolute 5.2 1.4 - 7.0 x10E3/uL   Lymphocytes Absolute 1.4 0.7 - 3.1 x10E3/uL   Monocytes Absolute 0.6 0.1 - 0.9 x10E3/uL   EOS (ABSOLUTE) 0.2 0.0 - 0.4 x10E3/uL   Basophils Absolute 0.1 0.0 - 0.2 x10E3/uL   Immature Granulocytes 0 Not Estab. %   Immature Grans (Abs) 0.0 0.0 - 0.1 x10E3/uL    Assessment & Plan:   Problem List Items Addressed This Visit       Endocrine   Hypothyroidism - Primary     Genitourinary   BPH (benign prostatic hyperplasia)     Other   Hyperlipidemia    Hypothyroidism improved on levothyroxine 125 mcg with TSH 4.200. BPH stable without medications. Can consider starting tamsulosin in the future if symptoms worsen. Discussed cause of tinnitus is likely his high frequency hearing loss, so best treatment option would be hearing aids.   Follow up plan: Return in about 6  months (around 06/30/2024), or if symptoms worsen or fail to improve, for Hyperlipidemia and thyroid and cholesterol are all.  Counseling provided for all of the vaccine components No orders of the defined types were placed in this encounter.   Gillermina Phy, Medical Student [facility] 01/01/2024, 11:35 AM  I was personally present for all components of the history, physical exam and/or medical decision making.  I agree with the  documentation performed by the student and agree with assessment and plan above.  No changes, consider tamsulosin in the future but did not want to try today.  Recommended to go get some hearing aids Arville Care, MD [facility] 01/09/2024, 9:13 AM

## 2024-01-02 ENCOUNTER — Encounter: Payer: Self-pay | Admitting: Family Medicine

## 2024-01-02 ENCOUNTER — Ambulatory Visit (HOSPITAL_COMMUNITY)
Admission: RE | Admit: 2024-01-02 | Discharge: 2024-01-02 | Disposition: A | Discharge status: Home / Self Care | Payer: PPO | Source: Ambulatory Visit | Attending: Physician Assistant | Admitting: Physician Assistant

## 2024-01-02 DIAGNOSIS — Z01818 Encounter for other preprocedural examination: Secondary | ICD-10-CM | POA: Diagnosis not present

## 2024-01-02 DIAGNOSIS — N400 Enlarged prostate without lower urinary tract symptoms: Secondary | ICD-10-CM | POA: Diagnosis not present

## 2024-01-02 DIAGNOSIS — I350 Nonrheumatic aortic (valve) stenosis: Secondary | ICD-10-CM | POA: Diagnosis not present

## 2024-01-02 DIAGNOSIS — I7143 Infrarenal abdominal aortic aneurysm, without rupture: Secondary | ICD-10-CM | POA: Diagnosis not present

## 2024-01-02 DIAGNOSIS — K5730 Diverticulosis of large intestine without perforation or abscess without bleeding: Secondary | ICD-10-CM | POA: Diagnosis not present

## 2024-01-02 MED ORDER — IOHEXOL 350 MG/ML SOLN
95.0000 mL | Freq: Once | INTRAVENOUS | Status: AC | PRN
  Administered 2024-01-02: 95 mL via INTRAVENOUS

## 2024-01-05 NOTE — Progress Notes (Unsigned)
[facility]   MULTIDISCIPLINARY HEART VALVE CLINIC                                     Cardiology Office Note:    Date:  01/07/2024   ID:  [name], DOB January 01, 1941, MRN [id number]  PCP:  Dettinger, Elige Radon, MD  [facility] Cardiologist:  Reatha Harps, MD  [facility] Electrophysiologist:  None   Referring MD: Dettinger, Elige Radon, MD   Post PCI follow up, discuss TAVR work up.   History of Present Illness:    Anthony Creveling. is a 83 y.o. male with a hx of bladder cancer, remote pleural effusion s/p remote VATs (2001) s/b Guillain Barre, HLD, carotid artery disease, CAD s/p PCI/DES to pRCA (12/25/23) and severe aortic stenosis who presents to clinic for follow up.  Echo 05/2023 showed LVEF 60-65% and severe aortic stenosis with mean gradient 53 mmHg, AVA 0.7 cm2, DI 0.22 as well as mild AI. He was seen on 06/19/23 by Dr. Flora Lipps and reported feeling well overall but given very severe AS, Dr. Flora Lipps felt that we should proceed with planning for TAVR. He was seen by Dr. Clifton James in the office on 06/30/23 and reported remaining very active with no symptoms and they elected for continued surveillance. Repeat echo 11/25/23 showed EF 70%, moderate LVH and severe AS with a mean gradient of 54.5 mmhg, aortic Vmax 4.13m/s, AVA 0.66cm2, DVI 0.2 and mild AI. Despite continued lack of symptoms it was felt prudent to start the TAVR work up to avoid heart failure and other adverse outcomes. [facility] 12/25/23 revealed severe stenosis in the proximal RCA (co-dominant vessel) s/p successful PTCA/DES x 1 proximal RCA. There was mild non-obstructive disease in the distal left main, proximal LAD and mid Circumflex and normal right heart pressures. Started on DAPT with aspirin and plavix x 6 months.   Cardiac gated CTA 01/02/24 revealed anatomical characteristics consistent with aortic stenosis suitable for treatment by transcatheter aortic valve replacement. There was bulky  calcification of the NCC and RCC extending into the annulus and LVOT. CTA of the aorta and iliac vessels demonstrated what appear to be adequate pelvic vascular access to facilitate a transfemoral approach; however, a 3cm infrarenal saccular aneurysm with associated penetrating atheromatous ulcer was noted.   Today the patient presents to clinic for follow up. Has had a rash on legs and arms that is a little itchy after cath. No CP or SOB. No LE edema, orthopnea or PND. No dizziness or syncope. No blood in stool or urine. No palpitations. Still walking 3 miles on the treadmill and playing golf.    Past Medical History:  Diagnosis Date   Anemia    Bilateral carotid artery stenosis without cerebral infarction    vascular--- dr Chestine Spore;   hx CVA;  last duplex  in epic 02-11-2023 left ICA 1-39%,  righrt ICA 40-59%,  bilateral ECA >50%   , pt to maintain on ASA 81mg  and statin   BPH (benign prostatic hypertrophy) with urinary obstruction    urologist--- dr Annabell Howells   CAD S/P percutaneous coronary angioplasty 12/25/2023   s/p PTCA/DES x 1 proximal RCA   Coronary artery disease    Diverticulosis of colon    Glaucoma    Guillain Barr syndrome (HCC) 2001   History of bladder cancer 2004   s/p TURBT  and BCG treatment's  History of colon polyps    History of pleural effusion 2001   s/p  VATS w/ drainage   History of vertebral fracture 2001   2001--   T12   Hyperlipidemia    Hypothyroidism    followed by pcp   Severe aortic stenosis    Tinnitus    Varicose veins of both lower extremities      Current Medications: Current Meds  Medication Sig   acetaminophen (TYLENOL) 650 MG CR tablet Take 650 mg by mouth every 8 (eight) hours as needed for pain.   atorvastatin (LIPITOR) 80 MG tablet Take 1 tablet (80 mg total) by mouth daily.   BAYER ASPIRIN PO Take 81 mg by mouth daily.   brimonidine (ALPHAGAN) 0.2 % ophthalmic solution Place 1 drop into the right eye 3 (three) times daily.   calcium  elemental as carbonate (TUMS ULTRA 1000) 400 MG chewable tablet Chew 1,000 mg by mouth at bedtime.   cholecalciferol (VITAMIN D3) 25 MCG (1000 UNIT) tablet Take 1,000 Units by mouth daily.   Coenzyme Q10 (COQ10) 200 MG CAPS Take 200 mg by mouth daily.   Cranberry 500 MG CAPS Take 500 mg by mouth daily.   dorzolamide-timolol (COSOPT) 22.3-6.8 MG/ML ophthalmic solution Place 1 drop into the right eye 2 (two) times daily.   ferrous sulfate 325 (65 FE) MG tablet Take 325 mg by mouth daily.   hyoscyamine (LEVSIN SL) 0.125 MG SL tablet DISSOLVE 1 TABLET IN MOUTH EVERY 8 HOURS AS NEEDED   levothyroxine (SYNTHROID) 137 MCG tablet Take 1 tablet (137 mcg total) by mouth daily before breakfast.   loratadine (CLARITIN) 10 MG tablet Take 10 mg by mouth daily.   Magnesium 400 MG TABS Take 250 mg by mouth daily.   Multiple Vitamin (MULTIVITAMIN WITH MINERALS) TABS tablet Take 1 tablet by mouth daily. Centrum silver   Multiple Vitamins-Minerals (HAIR SKIN NAILS PO) Take 1 tablet by mouth daily.   Netarsudil-Latanoprost (ROCKLATAN) 0.02-0.005 % SOLN Place 1 drop into both eyes at bedtime.   NON FORMULARY Take 10 mg by mouth at bedtime. CBD Gummy hemp isolate   NON FORMULARY Take 2 tablets by mouth 3 (three) times daily. Flavonoid   Omega-3 Fatty Acids (FISH OIL) 1000 MG CAPS Take 1,000 mg by mouth daily.   sodium chloride (OCEAN) 0.65 % SOLN nasal spray Place 1 spray into both nostrils daily. May continue to use throughout the day as needed for congestion   [DISCONTINUED] atorvastatin (LIPITOR) 40 MG tablet Take 1 tablet (40 mg total) by mouth daily.   [DISCONTINUED] clopidogrel (PLAVIX) 75 MG tablet Take 1 tablet (75 mg total) by mouth daily.      ROS:   Please see the history of present illness.    All other systems reviewed and are negative.  EKGs   EKG Interpretation Date/Time:  Wednesday January 07 2024 14:50:12 EST Ventricular Rate:  74 PR Interval:  154 QRS Duration:  92 QT Interval:  422 QTC  Calculation: 468 R Axis:   -17  Text Interpretation: Normal sinus rhythm Nonspecific ST and T wave abnormality Confirmed by Cline Crock [phone]) on 01/07/2024 2:52:15 PM   Risk Assessment/Calculations:     STS score Procedure Type: Isolated AVR  Perioperative Outcome Estimate %  Operative Mortality 1.87%  Morbidity & Mortality 6.71%  Stroke 1.09%  Renal Failure 0.981%  Reoperation 3.82%  Prolonged Ventilation 2.26%  Deep Sternal Wound Infection 0.041%  Long Hospital Stay (>14 days) 3.17%  Short Hospital Stay (<6 days)*  50.1%       Physical Exam:    VS:  BP (!) 121/50   Pulse 74   Ht 5\' 8"  (1.727 m)   Wt 180 lb 6.4 oz (81.8 kg)   SpO2 98%   BMI 27.43 kg/m     Wt Readings from Last 3 Encounters:  01/07/24 180 lb 6.4 oz (81.8 kg)  01/01/24 180 lb (81.6 kg)  12/25/23 174 lb (78.9 kg)     GEN: Well nourished, well developed in no acute distress NECK: No JVD CARDIAC: RRR, 3/6 harsh systolic murmur heard best at RUSB. No rubs, gallops RESPIRATORY:  Clear to auscultation without rales, wheezing or rhonchi  ABDOMEN: Soft, non-tender, non-distended EXTREMITIES:  No edema; No deformity. Diffuse petechial looking rash noted on legs and wrists.   ASSESSMENT:    1. CAD S/P percutaneous coronary angioplasty   2. Aortic stenosis, severe   3. Mixed hyperlipidemia   4. Infrarenal abdominal aortic aneurysm (AAA) without rupture (HCC)     PLAN:    In order of problems listed above:  CAD s/p PCI: doing well s/p DES to pRCA on 12/25/23.  -- Radial site stable. -- No chest pain. -- Continue DAPT with aspirin 81 mg daily and Plavix 75mg  daily x 6 months. Continue atorvastatin but will increase from 40mg  to 80mg  daily. No SL NTG given severe AS.  -- He did develop a rash after contrast exposure. Will premedicated prior to TAVR and other contrast mediated studies.    Severe AS: cardiac gated CTA 01/02/24 revealed anatomical characteristics consistent with aortic stenosis  suitable for treatment by transcatheter aortic valve replacement. There was bulky calcification of the NCC and RCC extending into the annulus and LVOT. CTA of the aorta and iliac vessels demonstrated what appear to be adequate pelvic vascular access to facilitate a transfemoral approach; however, a 3cm infrarenal saccular aneurysm with associated penetrating atheromatous ulcer was noted.  -- Will refer to vascular to see if we can safely proceed via the TF approach. If not, we may need to consider alternative access. He has an apt with Dr. Laneta Simmers on 01/25/24.  AAA: as above, pre TAVR scans showed a 3cm infrarenal saccular aneurysm with associated penetrating atheromatous ulcer. He has an apt with Dr. Chestine Spore with [facility] on 01/13/24.   HLD: recent LDL 88 with a goal <70. Will increase atorvastatin from 40mg  to 80 mg daily. Will repeat lipids and LFTs in 6-8 weeks.   Medication Adjustments/Labs and Tests Ordered: Current medicines are reviewed at length with the patient today.  Concerns regarding medicines are outlined above.  Orders Placed This Encounter  Procedures   EKG 12-Lead   Meds ordered this encounter  Medications   atorvastatin (LIPITOR) 80 MG tablet    Sig: Take 1 tablet (80 mg total) by mouth daily.    Dispense:  90 tablet    Refill:  1   clopidogrel (PLAVIX) 75 MG tablet    Sig: Take 1 tablet (75 mg total) by mouth daily.    Dispense:  90 tablet    Refill:  1    Patient Instructions  Medication Instructions:  Increase atorvastatin to 80 mg daily. *If you need a refill on your cardiac medications before your next appointment, please call your pharmacy*  Lab Work: None ordered If you have labs (blood work) drawn today and your tests are completely normal, you will receive your results only by: MyChart Message (if you have MyChart) OR A paper copy in the mail  If you have any lab test that is abnormal or we need to change your treatment, we will call you to review the  results.  Follow-Up: At [facility], you and your health needs are our priority.  As part of our continuing mission to provide you with exceptional heart care, we have created designated Provider Care Teams.  These Care Teams include your primary Cardiologist (physician) and Advanced Practice Providers (APPs -  Physician Assistants and Nurse Practitioners) who all work together to provide you with the care you need, when you need it.  Your next appointment:   01/28/24 with Dr Laneta Simmers as scheduled       Signed, Cline Crock, PA-C  01/07/2024 3:27 PM     [facility]

## 2024-01-07 ENCOUNTER — Ambulatory Visit: Payer: PPO | Attending: Internal Medicine | Admitting: Physician Assistant

## 2024-01-07 ENCOUNTER — Other Ambulatory Visit: Payer: Self-pay | Admitting: Physician Assistant

## 2024-01-07 VITALS — BP 121/50 | HR 74 | Ht 68.0 in | Wt 180.4 lb

## 2024-01-07 DIAGNOSIS — I7143 Infrarenal abdominal aortic aneurysm, without rupture: Secondary | ICD-10-CM | POA: Diagnosis not present

## 2024-01-07 DIAGNOSIS — E782 Mixed hyperlipidemia: Secondary | ICD-10-CM | POA: Diagnosis not present

## 2024-01-07 DIAGNOSIS — I2510 Atherosclerotic heart disease of native coronary artery without angina pectoris: Secondary | ICD-10-CM | POA: Diagnosis not present

## 2024-01-07 DIAGNOSIS — I350 Nonrheumatic aortic (valve) stenosis: Secondary | ICD-10-CM

## 2024-01-07 DIAGNOSIS — R030 Elevated blood-pressure reading, without diagnosis of hypertension: Secondary | ICD-10-CM

## 2024-01-07 DIAGNOSIS — Z9861 Coronary angioplasty status: Secondary | ICD-10-CM

## 2024-01-07 MED ORDER — CLOPIDOGREL BISULFATE 75 MG PO TABS: 75.0000 mg | ORAL_TABLET | Freq: Every day | ORAL | 1 refills | Status: DC

## 2024-01-07 MED ORDER — ATORVASTATIN CALCIUM 80 MG PO TABS: 80.0000 mg | ORAL_TABLET | Freq: Every day | ORAL | 1 refills | Status: DC

## 2024-01-07 NOTE — Patient Instructions (Signed)
Medication Instructions:  Increase atorvastatin to 80 mg daily. *If you need a refill on your cardiac medications before your next appointment, please call your pharmacy*  Lab Work: None ordered If you have labs (blood work) drawn today and your tests are completely normal, you will receive your results only by: MyChart Message (if you have MyChart) OR A paper copy in the mail If you have any lab test that is abnormal or we need to change your treatment, we will call you to review the results.  Follow-Up: At [facility], you and your health needs are our priority.  As part of our continuing mission to provide you with exceptional heart care, we have created designated Provider Care Teams.  These Care Teams include your primary Cardiologist (physician) and Advanced Practice Providers (APPs -  Physician Assistants and Nurse Practitioners) who all work together to provide you with the care you need, when you need it.  Your next appointment:   01/28/24 with Dr Laneta Simmers as scheduled

## 2024-01-07 NOTE — Progress Notes (Signed)
Pre Surgical Assessment: 5 M Walk Test  73M=16.71ft  5 Meter Walk Test- trial 1: 4.2 seconds 5 Meter Walk Test- trial 2: 4.6 seconds 5 Meter Walk Test- trial 3: 4.5 seconds 5 Meter Walk Test Average: 4.43 seconds

## 2024-01-13 ENCOUNTER — Encounter: Payer: Self-pay | Admitting: Vascular Surgery

## 2024-01-13 ENCOUNTER — Ambulatory Visit: Payer: PPO | Admitting: Vascular Surgery

## 2024-01-13 VITALS — BP 137/74 | HR 67 | Temp 98.0°F | Resp 18 | Ht 68.0 in | Wt 182.8 lb

## 2024-01-13 DIAGNOSIS — I7143 Infrarenal abdominal aortic aneurysm, without rupture: Secondary | ICD-10-CM | POA: Diagnosis not present

## 2024-01-13 DIAGNOSIS — I714 Abdominal aortic aneurysm, without rupture, unspecified: Secondary | ICD-10-CM | POA: Insufficient documentation

## 2024-01-13 NOTE — Progress Notes (Signed)
 Patient name: Anthony Keast Pilger Jr. MRN: [id number] DOB: [birth year] Sex: male  REASON FOR CONSULT: 3 cm saccular infra-renal AAA  HPI: Anthony Macleod Everage Jr. is a 83 y.o. male, with history of carotid stenosis, CAD, severe aortic stenosis, bladder cancer, Guillain-Barr that presents for evaluation of a 3 cm saccular infrarenal abdominal aortic aneurysm.  Previously followed for carotid disease by Dr. Harvey in our practice.  Now undergoing evaluation for TAVR.  CTA on 01/02/2024 with evidence of 3 cm infrarenal saccular aneurysm from penetrating aortic ulcer.  Patient is very active playing golf.  States his TAVR is scheduled for March 18 at [facility].  No new abdominal or back pain.  No prior knowledge of this prior to CT scans for TAVR evaluation.    Past Medical History:  Diagnosis Date   Anemia    Bilateral carotid artery stenosis without cerebral infarction    vascular--- dr gretta;   hx CVA;  last duplex  in epic 02-11-2023 left ICA 1-39%,  righrt ICA 40-59%,  bilateral ECA >50%   , pt to maintain on ASA 81mg  and statin   BPH (benign prostatic hypertrophy) with urinary obstruction    urologist--- dr watt   CAD S/P percutaneous coronary angioplasty 12/25/2023   s/p PTCA/DES x 1 proximal RCA   Coronary artery disease    Diverticulosis of colon    Glaucoma    Guillain Barr syndrome (HCC) 2001   History of bladder cancer 2004   s/p TURBT  and BCG treatment's   History of colon polyps    History of pleural effusion 2001   s/p  VATS w/ drainage   History of vertebral fracture 2001   2001--   T12   Hyperlipidemia    Hypothyroidism    followed by pcp   Severe aortic stenosis    Tinnitus    Varicose veins of both lower extremities     Past Surgical History:  Procedure Laterality Date   CORONARY STENT INTERVENTION N/A 12/25/2023   Procedure: CORONARY STENT INTERVENTION;  Surgeon: Verlin Lonni BIRCH, MD;  Location: [facility];  Service: Cardiovascular;   Laterality: N/A;   CYSTO/  BILATERAL RETROGRADE PYLOGRAM/  RESECTION BLADDER TUMOR  07/30/2011   @[facility]   by dr watt   CYSTOSCOPY WITH BIOPSY N/A 01/12/2015   Procedure: CYSTOSCOPY WITH BLADDER  BIOPSY, FULGERATION;  Surgeon: Norleen JINNY Watt, MD;  Location: [facility];  Service: Urology;  Laterality: N/A;   CYSTOSCOPY WITH BIOPSY Bilateral 05/13/2023   Procedure: CYSTOSCOPY,BLADDER  BIOPSY WITH FULGURATION, BILATERAL RETROGRADE PYELOGRAM;  Surgeon: Watt Norleen, MD;  Location: [facility];  Service: Urology;  Laterality: Bilateral;  1 HR FOR CASE   ORIF RIGHT DISTAL RADIUS FX  12/31/2007   @[facility]   by dr c. blackman   RIGHT HEART CATH AND CORONARY ANGIOGRAPHY N/A 12/25/2023   Procedure: RIGHT HEART CATH AND CORONARY ANGIOGRAPHY;  Surgeon: Verlin Lonni BIRCH, MD;  Location: [facility]VAS[facility];  Service: Cardiovascular;  Laterality: N/A;   TONSILLECTOMY     child   TRANSURETHRAL RESECTION OF BLADDER TUMOR WITH MITOMYCIN -C  06/07/2003   @[facility]  by dr watt   VIDEO ASSISTED THORACOSCOPY (VATS)/EMPYEMA Right 05/01/2000   @[facility]   by dr kerrin;  and Drainage of effusion    Family History  Problem Relation Age of Onset   Other Mother        varicose veins   Cancer Mother        Ovarian   Varicose  Veins Mother    Nephrolithiasis Mother    COPD Father    Hyperlipidemia Father    Cancer Maternal Grandmother    Bipolar disorder Daughter    Healthy Daughter    Hypertension Sister    Healthy Daughter    Colon cancer Neg Hx     SOCIAL HISTORY: Social History   Socioeconomic History   Marital status: Married    Spouse name: Not on file   Number of children: 2   Years of education: Not on file   Highest education level: Not on file  Occupational History   Occupation: retired    Comment: self employed-printing  Tobacco Use   Smoking status: Former    Current packs/day: 0.00    Average packs/day: 1 pack/day for 20.0 years (20.0 ttl pk-yrs)    Types: Cigarettes    Start  date: 1963    Quit date: 1983    Years since quitting: 42.1    Passive exposure: Never   Smokeless tobacco: Never  Vaping Use   Vaping status: Never Used  Substance and Sexual Activity   Alcohol use: Yes    Alcohol/week: 2.0 standard drinks of alcohol    Types: 1 Shots of liquor, 1 Standard drinks or equivalent per week   Drug use: No    Comment: Hemp gummies   Sexual activity: Not on file  Other Topics Concern   Not on file  Social History Narrative   Lives at home with wife and daughter.      Social Drivers of Corporate Investment Banker Strain: Low Risk  (05/02/2020)   Overall Financial Resource Strain (CARDIA)    Difficulty of Paying Living Expenses: Not hard at all  Food Insecurity: No Food Insecurity (05/02/2020)   Hunger Vital Sign    Worried About Running Out of Food in the Last Year: Never true    Ran Out of Food in the Last Year: Never true  Transportation Needs: No Transportation Needs (05/02/2020)   PRAPARE - Administrator, Civil Service (Medical): No    Lack of Transportation (Non-Medical): No  Physical Activity: Sufficiently Active (05/02/2020)   Exercise Vital Sign    Days of Exercise per Week: 5 days    Minutes of Exercise per Session: 60 min  Stress: No Stress Concern Present (05/02/2020)   [name] of Occupational Health - Occupational Stress Questionnaire    Feeling of Stress : Not at all  Social Connections: Socially Integrated (05/02/2020)   Social Connection and Isolation Panel [NHANES]    Frequency of Communication with Friends and Family: More than three times a week    Frequency of Social Gatherings with Friends and Family: More than three times a week    Attends Religious Services: More than 4 times per year    Active Member of [employer] or Organizations: Yes    Attends Engineer, Structural: More than 4 times per year    Marital Status: Married  Catering Manager Violence: Not At Risk (05/02/2020)   Humiliation, Afraid, Rape,  and Kick questionnaire    Fear of Current or Ex-Partner: No    Emotionally Abused: No    Physically Abused: No    Sexually Abused: No    Allergies  Allergen Reactions   Bcg Live Other (See Comments)    Treatment pt had- shakes   Septra [Bactrim] Other (See Comments)    Muscle weakness, numbness   Sulfa Antibiotics Other (See Comments)    Severe weakness  Pertussis Vaccines     History of Gillian barrie    Iodinated Contrast Media Rash    Current Outpatient Medications  Medication Sig Dispense Refill   acetaminophen  (TYLENOL ) 650 MG CR tablet Take 650 mg by mouth every 8 (eight) hours as needed for pain.     atorvastatin  (LIPITOR ) 80 MG tablet Take 1 tablet (80 mg total) by mouth daily. 90 tablet 1   BAYER ASPIRIN  PO Take 81 mg by mouth daily.     brimonidine  (ALPHAGAN ) 0.2 % ophthalmic solution Place 1 drop into the right eye 3 (three) times daily.     calcium  elemental as carbonate (TUMS ULTRA 1000) 400 MG chewable tablet Chew 1,000 mg by mouth at bedtime.     cholecalciferol (VITAMIN D3) 25 MCG (1000 UNIT) tablet Take 1,000 Units by mouth daily.     clopidogrel  (PLAVIX ) 75 MG tablet Take 1 tablet (75 mg total) by mouth daily. 90 tablet 1   Coenzyme Q10 (COQ10) 200 MG CAPS Take 200 mg by mouth daily.     Cranberry 500 MG CAPS Take 500 mg by mouth daily.     dorzolamide -timolol  (COSOPT ) 22.3-6.8 MG/ML ophthalmic solution Place 1 drop into the right eye 2 (two) times daily.     ferrous sulfate  325 (65 FE) MG tablet Take 325 mg by mouth daily.     hyoscyamine  (LEVSIN  SL) 0.125 MG SL tablet DISSOLVE 1 TABLET IN MOUTH EVERY 8 HOURS AS NEEDED 120 tablet 2   levothyroxine  (SYNTHROID ) 137 MCG tablet Take 1 tablet (137 mcg total) by mouth daily before breakfast. 90 tablet 1   loratadine  (CLARITIN ) 10 MG tablet Take 10 mg by mouth daily.     Magnesium  400 MG TABS Take 250 mg by mouth daily.     Multiple Vitamin (MULTIVITAMIN WITH MINERALS) TABS tablet Take 1 tablet by mouth daily.  Centrum silver     Multiple Vitamins-Minerals (HAIR SKIN NAILS PO) Take 1 tablet by mouth daily.     Netarsudil -Latanoprost  (ROCKLATAN ) 0.02-0.005 % SOLN Place 1 drop into both eyes at bedtime.     NON FORMULARY Take 10 mg by mouth at bedtime. CBD Gummy hemp isolate     NON FORMULARY Take 2 tablets by mouth 3 (three) times daily. Flavonoid     Omega-3 Fatty Acids (FISH OIL) 1000 MG CAPS Take 1,000 mg by mouth daily.     sodium chloride  (OCEAN) 0.65 % SOLN nasal spray Place 1 spray into both nostrils daily. May continue to use throughout the day as needed for congestion     No current facility-administered medications for this visit.    REVIEW OF SYSTEMS:  [X]  denotes positive finding, [ ]  denotes negative finding Cardiac  Comments:  Chest pain or chest pressure:    Shortness of breath upon exertion:    Short of breath when lying flat:    Irregular heart rhythm:        Vascular    Pain in calf, thigh, or hip brought on by ambulation:    Pain in feet at night that wakes you up from your sleep:     Blood clot in your veins:    Leg swelling:         Pulmonary    Oxygen at home:    Productive cough:     Wheezing:         Neurologic    Sudden weakness in arms or legs:     Sudden numbness in arms or legs:  Sudden onset of difficulty speaking or slurred speech:    Temporary loss of vision in one eye:     Problems with dizziness:         Gastrointestinal    Blood in stool:     Vomited blood:         Genitourinary    Burning when urinating:     Blood in urine:        Psychiatric    Major depression:         Hematologic    Bleeding problems:    Problems with blood clotting too easily:        Skin    Rashes or ulcers:        Constitutional    Fever or chills:      PHYSICAL EXAM: Vitals:   01/13/24 1455 01/13/24 1457  BP: (!) 150/74 137/74  Pulse: 67   Resp: 18   Temp: 98 F (36.7 C)   TempSrc: Oral   SpO2: 94%   Weight: 182 lb 12.8 oz (82.9 kg)   Height:  5' 8 (1.727 m)     GENERAL: The patient is a well-nourished male, in no acute distress. The vital signs are documented above. CARDIAC: There is a regular rate and rhythm.  VASCULAR:  Bilateral femoral pulses palpable Bilateral PT pulses palpable PULMONARY: No respiratory distress. ABDOMEN: Soft and non-tender. MUSCULOSKELETAL: There are no major deformities or cyanosis. NEUROLOGIC: No focal weakness or paresthesias are detected. SKIN: There are no ulcers or rashes noted. PSYCHIATRIC: The patient has a normal affect.  DATA:   CTA reviewed from 01/02/2024 with 3 cm saccular infrarenal abdominal aortic aneurysm from penetrating aortic ulcer  Assessment/Plan:   83 y.o. male, with history of carotid stenosis, CAD, severe aortic stenosis, bladder cancer, Guillain-Barr that presents for evaluation of a 3 cm saccular infrarenal abdominal aortic aneurysm.  Now undergoing evaluation for TAVR.  CTA on 01/02/2024 with evidence of 3 cm infrarenal saccular aneurysm from penetrating aortic ulcer that was discovered incidentally.    States his TAVR is scheduled for March 18 at [facility].  Discussed given the saccular morphology of his abdominal aortic aneurysm this should be repaired on an elective basis.  These saccular aneurysms are higher risk than fusiform aneurysms as we discussed today.  His native aorta is relatively small and only measures about 13 mm distal to the aneurysm.  I think we can repair his infrarenal aneurysm with likely two 20 mm aortic cuffs from a transfemoral approach.  Likely pursue TAVR first and this is already scheduled for March 18 and then we can follow-up for elective repair of his aneurysm at a later date.  The TAVR team does need to be aware of his penetrating ulcer with aneurysm for transfemoral access especially with wire access.  Certainly our team is available if there are issues at the time of TAVR but I think transfemoral access should be safe.  I will tentatively  schedule 16-month follow-up with me in [place] to see how he is recovering after aortic valve replacement and then make a decision about elective aneurysm repair at a later date.  I will reach out to cardiology as well to ensure we are on the same page regarding plan of care.  Lonni DOROTHA Gaskins, MD [facility] Office: [phone]

## 2024-01-22 ENCOUNTER — Encounter: Payer: PPO | Admitting: Thoracic Surgery (Cardiothoracic Vascular Surgery)

## 2024-01-28 ENCOUNTER — Encounter: Payer: Self-pay | Admitting: Surgery

## 2024-01-28 ENCOUNTER — Institutional Professional Consult (permissible substitution): Payer: PPO | Admitting: Surgery

## 2024-01-28 VITALS — BP 152/75 | HR 61 | Resp 18 | Ht 68.0 in | Wt 182.0 lb

## 2024-01-28 DIAGNOSIS — I351 Nonrheumatic aortic (valve) insufficiency: Secondary | ICD-10-CM

## 2024-01-28 NOTE — Progress Notes (Addendum)
 Patient ID: Anthony Genta Daisy Blossom., male   DOB: 06/03/1941, 83 y.o.   MRN: [id number]   [facility]       [address]       [place] [address]             [phone]          CARDIOTHORACIC SURGERY CONSULTATION REPORT  PCP is Dettinger, Elige Radon, MD Referring Provider is Verne Carrow, MD Primary Cardiologist is Reatha Harps, MD  Reason for consultation:  Severe aortic stenosis  HPI:  The patient is an 83 year old gentleman with a history of CVA with bilateral carotid artery stenosis, hyperlipidemia, hypothyroidism, coronary artery disease status post recent PCI/DES x 1 to the proximal RCA, history of bladder cancer, remote pleural effusion status post VATS drainage in 2001 complicated by development of postoperative Guillain-Barr syndrome that completely resolved after about 6 weeks, and severe aortic stenosis followed by Dr. Lennie Odor.  He had an echocardiogram in June 2024 showing severe aortic stenosis with a mean gradient of 53 mmHg and a valve area of 0.7 cm.  Dimensionless index was 0.22 with mild aortic insufficiency.  Left ventricular ejection fraction was 60 to 65%.  The patient was asymptomatic but TAVR was recommended due to his high gradient.  The patient wanted to wait.  He had a follow-up echocardiogram on 11/25/2023 which showed a mean gradient of 54.5 mmHg with a valve area of 0.66 cm by VTI and a dimensionless index of 0.2.  Left ventricular ejection fraction remains normal with moderate LVH and grade 1 diastolic dysfunction.  TAVR was again recommended by Dr. Flora Lipps and the patient agreed to proceed with workup.  He is here today with his wife.  He reports going to the gym several days per week and walks on treadmill and uses an elliptical machine.  He denies any shortness of breath or fatigue.  He denies dizziness and syncope although his wife reports an episode a few weeks ago where  he was working out on elliptical machine and suddenly fell off the side to the floor injuring his left arm and left ankle.  He thinks he tripped but his wife thinks that he probably got dizzy.  He denies any orthopnea or peripheral edema.  Past Medical History:  Diagnosis Date   Anemia    Bilateral carotid artery stenosis without cerebral infarction    vascular--- dr Chestine Spore;   hx CVA;  last duplex  in epic 02-11-2023 left ICA 1-39%,  righrt ICA 40-59%,  bilateral ECA >50%   , pt to maintain on ASA 82mg  and statin   BPH (benign prostatic hypertrophy) with urinary obstruction    urologist--- dr Annabell Howells   CAD S/P percutaneous coronary angioplasty 12/25/2023   s/p PTCA/DES x 1 proximal RCA   Coronary artery disease    Diverticulosis of colon    Glaucoma    Guillain Barr syndrome (HCC) 2001   History of bladder cancer 2004   s/p TURBT  and BCG treatment's   History of colon polyps    History of pleural effusion 2001   s/p  VATS w/ drainage   History of vertebral fracture 2001   2001--   T12   Hyperlipidemia    Hypothyroidism    followed by pcp   Severe aortic stenosis    Tinnitus    Varicose veins of both lower extremities     Past Surgical History:  Procedure  Laterality Date   CORONARY STENT INTERVENTION N/A 12/25/2023   Procedure: CORONARY STENT INTERVENTION;  Surgeon: Kathleene Hazel, MD;  Location: [facility];  Service: Cardiovascular;  Laterality: N/A;   CYSTO/  BILATERAL RETROGRADE PYLOGRAM/  RESECTION BLADDER TUMOR  07/30/2011   @[facility]   by dr Annabell Howells   CYSTOSCOPY WITH BIOPSY N/A 01/12/2015   Procedure: CYSTOSCOPY WITH BLADDER  BIOPSY, FULGERATION;  Surgeon: Anner Crete, MD;  Location: [facility];  Service: Urology;  Laterality: N/A;   CYSTOSCOPY WITH BIOPSY Bilateral 05/13/2023   Procedure: CYSTOSCOPY,BLADDER  BIOPSY WITH FULGURATION, BILATERAL RETROGRADE PYELOGRAM;  Surgeon: Bjorn Pippin, MD;  Location: [facility];  Service: Urology;  Laterality:  Bilateral;  1 HR FOR CASE   ORIF RIGHT DISTAL RADIUS FX  12/31/2007   @[facility]   by dr c. blackman   RIGHT HEART CATH AND CORONARY ANGIOGRAPHY N/A 12/25/2023   Procedure: RIGHT HEART CATH AND CORONARY ANGIOGRAPHY;  Surgeon: Kathleene Hazel, MD;  Location: [facility];  Service: Cardiovascular;  Laterality: N/A;   TONSILLECTOMY     child   TRANSURETHRAL RESECTION OF BLADDER TUMOR WITH MITOMYCIN-C  06/07/2003   @[facility]  by dr Annabell Howells   VIDEO ASSISTED THORACOSCOPY (VATS)/EMPYEMA Right 05/01/2000   @[facility]   by dr Dorris Fetch;  and Drainage of effusion    Family History  Problem Relation Age of Onset   Other Mother        varicose veins   Cancer Mother        Ovarian   Varicose Veins Mother    Nephrolithiasis Mother    COPD Father    Hyperlipidemia Father    Cancer Maternal Grandmother    Bipolar disorder Daughter    Healthy Daughter    Hypertension Sister    Healthy Daughter    Colon cancer Neg Hx     Social History   Socioeconomic History   Marital status: Married    Spouse name: Not on file   Number of children: 2   Years of education: Not on file   Highest education level: Not on file  Occupational History   Occupation: retired    Comment: self employed-printing  Tobacco Use   Smoking status: Former    Current packs/day: 0.00    Average packs/day: 1 pack/day for 20.0 years (20.0 ttl pk-yrs)    Types: Cigarettes    Start date: 1963    Quit date: 1983    Years since quitting: 42.1    Passive exposure: Never   Smokeless tobacco: Never  Vaping Use   Vaping status: Never Used  Substance and Sexual Activity   Alcohol use: Yes    Alcohol/week: 2.0 standard drinks of alcohol    Types: 1 Shots of liquor, 1 Standard drinks or equivalent per week   Drug use: No    Comment: Hemp gummies   Sexual activity: Not on file  Other Topics Concern   Not on file  Social History Narrative   Lives at home with wife and daughter.      Social Drivers of Research scientist (physical sciences) Strain: Low Risk  (05/02/2020)   Overall Financial Resource Strain (CARDIA)    Difficulty of Paying Living Expenses: Not hard at all  Food Insecurity: No Food Insecurity (05/02/2020)   Hunger Vital Sign    Worried About Running Out of Food in the Last Year: Never true    Ran Out of Food in the Last Year: Never true  Transportation Needs: No  Transportation Needs (05/02/2020)   PRAPARE - Administrator, Civil Service (Medical): No    Lack of Transportation (Non-Medical): No  Physical Activity: Sufficiently Active (05/02/2020)   Exercise Vital Sign    Days of Exercise per Week: 5 days    Minutes of Exercise per Session: 60 min  Stress: No Stress Concern Present (05/02/2020)   [name] of Occupational Health - Occupational Stress Questionnaire    Feeling of Stress : Not at all  Social Connections: Socially Integrated (05/02/2020)   Social Connection and Isolation Panel [NHANES]    Frequency of Communication with Friends and Family: More than three times a week    Frequency of Social Gatherings with Friends and Family: More than three times a week    Attends Religious Services: More than 4 times per year    Active Member of [employer] or Organizations: Yes    Attends Banker Meetings: More than 4 times per year    Marital Status: Married  Catering manager Violence: Not At Risk (05/02/2020)   Humiliation, Afraid, Rape, and Kick questionnaire    Fear of Current or Ex-Partner: No    Emotionally Abused: No    Physically Abused: No    Sexually Abused: No    Prior to Admission medications   Medication Sig Start Date End Date Taking? Authorizing Provider  acetaminophen (TYLENOL) 650 MG CR tablet Take 650 mg by mouth every 8 (eight) hours as needed for pain.   Yes [provider]  atorvastatin (LIPITOR) 80 MG tablet Take 1 tablet (80 mg total) by mouth daily. 01/07/24  Yes Janetta Hora, PA-C  BAYER ASPIRIN PO Take 81 mg by mouth daily.   Yes  [provider]  brimonidine (ALPHAGAN) 0.2 % ophthalmic solution Place 1 drop into the right eye 3 (three) times daily.   Yes [provider]  calcium elemental as carbonate (TUMS ULTRA 1000) 400 MG chewable tablet Chew 1,000 mg by mouth at bedtime.   Yes [provider]  cholecalciferol (VITAMIN D3) 25 MCG (1000 UNIT) tablet Take 1,000 Units by mouth daily.   Yes [provider]  clopidogrel (PLAVIX) 75 MG tablet Take 1 tablet (75 mg total) by mouth daily. 01/07/24  Yes Janetta Hora, PA-C  Coenzyme Q10 (COQ10) 200 MG CAPS Take 400 mg by mouth daily.   Yes [provider]  Cranberry 500 MG CAPS Take 500 mg by mouth daily.   Yes [provider]  dorzolamide-timolol (COSOPT) 22.3-6.8 MG/ML ophthalmic solution Place 1 drop into the right eye 2 (two) times daily.   Yes [provider]  ferrous sulfate 325 (65 FE) MG tablet Take 325 mg by mouth daily.   Yes [provider]  hyoscyamine (LEVSIN SL) 0.125 MG SL tablet DISSOLVE 1 TABLET IN MOUTH EVERY 8 HOURS AS NEEDED 03/20/23  Yes Dettinger, Elige Radon, MD  levothyroxine (SYNTHROID) 137 MCG tablet Take 1 tablet (137 mcg total) by mouth daily before breakfast. 11/10/23  Yes Dettinger, Elige Radon, MD  loratadine (CLARITIN) 10 MG tablet Take 10 mg by mouth daily.   Yes [provider]  Magnesium 400 MG TABS Take 250 mg by mouth daily.   Yes [provider]  Multiple Vitamin (MULTIVITAMIN WITH MINERALS) TABS tablet Take 1 tablet by mouth daily. Centrum silver   Yes [provider]  Multiple Vitamins-Minerals (HAIR SKIN NAILS PO) Take 1 tablet by mouth daily.   Yes [provider]  Netarsudil-Latanoprost (ROCKLATAN) 0.02-0.005 %  SOLN Place 1 drop into both eyes at bedtime.   Yes [provider]  NON FORMULARY Take 10 mg by mouth at bedtime. CBD Gummy hemp isolate   Yes [provider]  NON FORMULARY Take 2 tablets by mouth 3 (three)  times daily. Flavonoid   Yes [provider]  Omega-3 Fatty Acids (FISH OIL) 1000 MG CAPS Take 1,000 mg by mouth daily.   Yes [provider]  sodium chloride (OCEAN) 0.65 % SOLN nasal spray Place 1 spray into both nostrils daily. May continue to use throughout the day as needed for congestion   Yes [provider]    Current Outpatient Medications  Medication Sig Dispense Refill   acetaminophen (TYLENOL) 650 MG CR tablet Take 650 mg by mouth every 8 (eight) hours as needed for pain.     atorvastatin (LIPITOR) 80 MG tablet Take 1 tablet (80 mg total) by mouth daily. 90 tablet 1   BAYER ASPIRIN PO Take 81 mg by mouth daily.     brimonidine (ALPHAGAN) 0.2 % ophthalmic solution Place 1 drop into the right eye 3 (three) times daily.     calcium elemental as carbonate (TUMS ULTRA 1000) 400 MG chewable tablet Chew 1,000 mg by mouth at bedtime.     cholecalciferol (VITAMIN D3) 25 MCG (1000 UNIT) tablet Take 1,000 Units by mouth daily.     clopidogrel (PLAVIX) 75 MG tablet Take 1 tablet (75 mg total) by mouth daily. 90 tablet 1   Coenzyme Q10 (COQ10) 200 MG CAPS Take 400 mg by mouth daily.     Cranberry 500 MG CAPS Take 500 mg by mouth daily.     dorzolamide-timolol (COSOPT) 22.3-6.8 MG/ML ophthalmic solution Place 1 drop into the right eye 2 (two) times daily.     ferrous sulfate 325 (65 FE) MG tablet Take 325 mg by mouth daily.     hyoscyamine (LEVSIN SL) 0.125 MG SL tablet DISSOLVE 1 TABLET IN MOUTH EVERY 8 HOURS AS NEEDED 120 tablet 2   levothyroxine (SYNTHROID) 137 MCG tablet Take 1 tablet (137 mcg total) by mouth daily before breakfast. 90 tablet 1   loratadine (CLARITIN) 10 MG tablet Take 10 mg by mouth daily.     Magnesium 400 MG TABS Take 250 mg by mouth daily.     Multiple Vitamin (MULTIVITAMIN WITH MINERALS) TABS tablet Take 1 tablet by mouth daily. Centrum silver     Multiple Vitamins-Minerals (HAIR SKIN NAILS PO) Take 1 tablet by mouth daily.      Netarsudil-Latanoprost (ROCKLATAN) 0.02-0.005 % SOLN Place 1 drop into both eyes at bedtime.     NON FORMULARY Take 10 mg by mouth at bedtime. CBD Gummy hemp isolate     NON FORMULARY Take 2 tablets by mouth 3 (three) times daily. Flavonoid     Omega-3 Fatty Acids (FISH OIL) 1000 MG CAPS Take 1,000 mg by mouth daily.     sodium chloride (OCEAN) 0.65 % SOLN nasal spray Place 1 spray into both nostrils daily. May continue to use throughout the day as needed for congestion     No current facility-administered medications for this visit.    Allergies  Allergen Reactions   Bcg Live Other (See Comments)    Treatment pt had- shakes   Septra [Bactrim] Other (See Comments)    Muscle weakness, numbness   Sulfa Antibiotics Other (See Comments)    Severe weakness   Pertussis Vaccines     History of Gillian barrie    Iodinated  Contrast Media Rash      Review of Systems:   General:  normal appetite, normal energy, no weight gain, no weight loss, no fever  Cardiac:  no chest pain with exertion, no chest pain at rest, noSOB with  exertion, no resting SOB, no PND, no orthopnea, no palpitations, no arrhythmia, no atrial fibrillation, no LE edema, no dizzy spells, no syncope  Respiratory:  n shortness of breath, ono home oxygen, no productive cough, no dry cough, no bronchitis, no wheezing, no hemoptysis, no asthma, no pain with inspiration or cough, no sleep apnea, no CPAP at night  GI:   no difficulty swallowing, no reflux, no frequent heartburn, no hiatal hernia, no abdominal pain, no constipation, no diarrhea, no hematochezia, no hematemesis, no melena  GU:   no dysuria,  no frequency, no urinary tract infection, no hematuria, no enlarged prostate, no kidney stones, no kidney disease  Vascular:  no pain suggestive of claudication, no pain in feet, + leg cramps, + varicose veins, no DVT, no non-healing foot ulcer  Neuro:   + stroke, no TIA's, no seizures, no headaches, no temporary blindness one  eye,  no slurred speech, no peripheral neuropathy, no chronic pain, no instability of gait, no memory/cognitive dysfunction  Musculoskeletal: no arthritis, no joint swelling, no myalgias, no difficulty walking, normal mobility   Skin:   + rash, no itching, no skin infections, no pressure sores or ulcerations  Psych:   no anxiety, no depression, no nervousness, no unusual recent stress  Eyes:   no blurry vision, no floaters, no recent vision changes, + wears glasses   ENT:   no hearing loss, no loose or painful teeth, no dentures, last saw dentist 09/2023  Hematologic:  + easy bruising, no abnormal bleeding, no clotting disorder, no frequent epistaxis  Endocrine:  no diabetes, does not check CBG's at home     Physical Exam:   BP (!) 152/75   Pulse 61   Resp 18   Ht 5\' 8"  (1.727 m)   Wt 182 lb (82.6 kg)   SpO2 98%   BMI 27.67 kg/m   General:  Elderly,  well-appearing  HEENT:  Unremarkable, NCAT, PERLA, EOMI  Neck:   no JVD, no bruits, no adenopathy   Chest:   clear to auscultation, symmetrical breath sounds, no wheezes, no rhonchi   CV:   RRR, 3/6 systolic murmur RSB, no diastolic murmur  Abdomen:  soft, non-tender, no masses   Extremities:  warm, well-perfused, pedal pulses palpable, no lower extremity edema  Rectal/GU  Deferred  Neuro:   Grossly non-focal and symmetrical throughout  Skin:   Clean and dry, no rashes, no breakdown  Diagnostic Tests:  ECHOCARDIOGRAM REPORT       Patient Name:   [facility] Daisy Blossom. Date of Exam: 11/25/2023  Medical Rec #:  [id number]              Height:       69.0 in  Accession #:    [id number]             Weight:       172.0 lb  Date of Birth:  Sep 23, 1941               BSA:          1.938 m  Patient Age:    82 years               BP:  124/64 mmHg  Patient Gender: M                      HR:           78 bpm.  Exam Location:  [address]   Procedure: 2D Echo, Cardiac Doppler and Color Doppler   Indications:    I35.0 AS     History:        Patient has prior history of Echocardiogram examinations,  most                 recent 05/12/2023. AS; Risk Factors:Dyslipidemia and Former                  Smoker.    Sonographer:    Samule Ohm RDCS  Referring Phys: [id number] CHRISTOPHER D MCALHANY   IMPRESSIONS     1. Left ventricular ejection fraction, by estimation, is 70 to 75%. The  left ventricle has hyperdynamic function. The left ventricle has no  regional wall motion abnormalities. There is moderate left ventricular  hypertrophy. Left ventricular diastolic  parameters are consistent with Grade I diastolic dysfunction (impaired  relaxation).   2. Right ventricular systolic function is normal. The right ventricular  size is normal.   3. The mitral valve is normal in structure. No evidence of mitral valve  regurgitation. No evidence of mitral stenosis.   4. DI: 0.2. The aortic valve is calcified. There is severe calcifcation  of the aortic valve. There is severe thickening of the aortic valve.  Aortic valve regurgitation is mild. Severe aortic valve stenosis. Aortic  valve area, by VTI measures 0.66 cm.  Aortic valve mean gradient measures 54.5 mmHg. Aortic valve Vmax measures  4.81 m/s.   5. The inferior vena cava is normal in size with greater than 50%  respiratory variability, suggesting right atrial pressure of 3 mmHg.   Comparison(s): Prior images reviewed side by side. Prior AV mean gradient  53 mmHg.   FINDINGS   Left Ventricle: Left ventricular ejection fraction, by estimation, is 70  to 75%. The left ventricle has hyperdynamic function. The left ventricle  has no regional wall motion abnormalities. The left ventricular internal  cavity size was normal in size.  There is moderate left ventricular hypertrophy. Left ventricular diastolic  parameters are consistent with Grade I diastolic dysfunction (impaired  relaxation).   Right Ventricle: The right ventricular size is normal. No increase in   right ventricular wall thickness. Right ventricular systolic function is  normal.   Left Atrium: Left atrial size was normal in size.   Right Atrium: Right atrial size was normal in size.   Pericardium: There is no evidence of pericardial effusion.   Mitral Valve: The mitral valve is normal in structure. No evidence of  mitral valve regurgitation. No evidence of mitral valve stenosis.   Tricuspid Valve: The tricuspid valve is normal in structure. Tricuspid  valve regurgitation is not demonstrated. No evidence of tricuspid  stenosis.   Aortic Valve: DI: 0.2. The aortic valve is calcified. There is severe  calcifcation of the aortic valve. There is severe thickening of the aortic  valve. Aortic valve regurgitation is mild. Aortic regurgitation PHT  measures 372 msec. Severe aortic stenosis   is present. Aortic valve mean gradient measures 54.5 mmHg. Aortic valve  peak gradient measures 92.5 mmHg. Aortic valve area, by VTI measures 0.66  cm.   Pulmonic Valve: The pulmonic valve was normal in structure. Pulmonic valve  regurgitation  is not visualized. No evidence of pulmonic stenosis.   Aorta: The aortic root is normal in size and structure.   Venous: The inferior vena cava is normal in size with greater than 50%  respiratory variability, suggesting right atrial pressure of 3 mmHg.   IAS/Shunts: No atrial level shunt detected by color flow Doppler.     LEFT VENTRICLE  PLAX 2D  LVIDd:         3.60 cm   Diastology  LVIDs:         2.50 cm   LV e' medial:    4.57 cm/s  LV PW:         1.40 cm   LV E/e' medial:  14.4  LV IVS:        1.40 cm   LV e' lateral:   3.92 cm/s  LVOT diam:     2.00 cm   LV E/e' lateral: 16.8  LV SV:         67  LV SV Index:   35  LVOT Area:     3.14 cm     RIGHT VENTRICLE             IVC  RV S prime:     12.80 cm/s  IVC diam: 1.10 cm  TAPSE (M-mode): 1.6 cm   LEFT ATRIUM             Index        RIGHT ATRIUM           Index  LA diam:        3.30  cm 1.70 cm/m   RA Pressure: 3.00 mmHg  LA Vol (A2C):   71.5 ml 36.90 ml/m  RA Area:     11.00 cm  LA Vol (A4C):   52.8 ml 27.25 ml/m  RA Volume:   27.40 ml  14.14 ml/m  LA Biplane Vol: 62.5 ml 32.25 ml/m   AORTIC VALVE  AV Area (Vmax):    0.60 cm  AV Area (Vmean):   0.62 cm  AV Area (VTI):     0.66 cm  AV Vmax:           481.00 cm/s  AV Vmean:          345.250 cm/s  AV VTI:            1.022 m  AV Peak Grad:      92.5 mmHg  AV Mean Grad:      54.5 mmHg  LVOT Vmax:         92.35 cm/s  LVOT Vmean:        67.900 cm/s  LVOT VTI:          0.214 m  LVOT/AV VTI ratio: 0.21  AI PHT:            372 msec    AORTA  Ao Root diam: 3.00 cm  Ao Asc diam:  3.40 cm   MITRAL VALVE                TRICUSPID VALVE  MV Area (PHT): 1.85 cm     Estimated RAP:  3.00 mmHg  MV Decel Time: 411 msec  MV E velocity: 65.90 cm/s   SHUNTS  MV A velocity: 142.00 cm/s  Systemic VTI:  0.21 m  MV E/A ratio:  0.46         Systemic Diam: 2.00 cm   Donato Schultz MD  Electronically signed by Donato Schultz MD  Signature Date/Time: 11/26/2023/11:54:54 AM        Final      Physicians  Panel Physicians Referring Physician Case Authorizing Physician  Kathleene Hazel, MD (Primary)     Procedures  RIGHT HEART CATH AND CORONARY ANGIOGRAPHY  CORONARY STENT INTERVENTION   Conclusion      Prox RCA lesion is 99% stenosed.   Mid Cx to Dist Cx lesion is 30% stenosed.   Mid LM to Dist LM lesion is 30% stenosed.   Prox LAD to Mid LAD lesion is 30% stenosed.   Mid RCA lesion is 30% stenosed.   A drug-eluting stent was successfully placed using a STENT ONYX FRONTIER 2.75X15.   Post intervention, there is a 0% residual stenosis.   Severe stenosis in the proximal RCA (co-dominant vessel) Successful PTCA/DES x 1 proximal RCA Mild non-obstructive disease in the distal left main, proximal LAD and mid Circumflex Normal right heart pressures.  Severe aortic stenosis by echo. I did not cross the valve  today.    Recommendations: Will continue DAPT with ASA and Plavix. Same day post PCI discharge (6 hours). Continue workup for TAVR.    Indications  Coronary artery disease involving native coronary artery of native heart without angina pectoris [I25.10 (ICD-10-CM)]  Severe aortic stenosis [I35.0 (ICD-10-CM)]   Procedural Details  Technical Details Indication: 83 yo male with severe aortic stenosis, workup for TAVR.   Procedure: The risks, benefits, complications, treatment options, and expected outcomes were discussed with the patient. The patient and/or family concurred with the proposed plan, giving informed consent. The patient was sedated with Versed and Fentanyl. The IV catheter that was present in the right antecubital vein was changed for a 5 [place] sheath. Right heart catheterization performed with a balloon tipped catheter. The right wrist was prepped and draped in a sterile fashion. 1% lidocaine was used for local anesthesia. Using the modified Seldinger access technique, a 6 French Slender sheath was placed in the right radial artery. 3 mg Verapamil was given through the sheath. Weight based IV heparin was given. Standard diagnostic catheters were used to perform selective coronary angiography. I did not attempt to cross the aortic valve.   PCI Note: I engaged the RCA with a 3DRC guiding catheter. ACT over 300. Plavix 600 mg po x 1. I passed a Prowater IC wire down the RCA. I then dilated the severe stenosis in the proximal vessel with a 2.0 x 12 mm balloon. I then deployed a 2.75 x 15 mm Onyx DES in the proximal RCA. The stent was post-dilated with a 3.0 x 12 mm DeCordova balloon.   All catheter exchanges were performed over an exchange length guidewire. The sheath was removed from the right radial artery and a hemostasis band was applied at the arteriotomy site on the right wrist.      Estimated blood loss <50 mL.   During this procedure medications were administered to achieve and  maintain moderate conscious sedation while the patient's heart rate, blood pressure, and oxygen saturation were continuously monitored and I was present face-to-face 100% of this time.    and Ancil Linsey Cardiovascular Specialist are independent, trained observers who assisted in the monitoring of the patient's level of consciousness.   Medications (Filter: Administrations occurring from 1024 to 1148 on 12/25/23) Heparin (Porcine) in NaCl 1000-0.9 UT/500ML-% SOLN (mL)  Total volume: 1,000 mL Date/Time Rate/Dose/Volume Action   12/25/23 1028 500 mL Given   1028 500 mL Given   fentaNYL (SUBLIMAZE) injection (mcg)  Total dose: 50 mcg Date/Time Rate/Dose/Volume Action   12/25/23 1041 25 mcg Given   1047 25 mcg Given   midazolam (VERSED) injection (mg)  Total dose: 2 mg Date/Time Rate/Dose/Volume Action   12/25/23 1041 1 mg Given   1047 1 mg Given   lidocaine (PF) (XYLOCAINE) 1 % injection (mL)  Total volume: 2 mL Date/Time Rate/Dose/Volume Action   12/25/23 1045 2 mL Given   Radial Cocktail/Verapamil only (mL)  Total volume: 10 mL Date/Time Rate/Dose/Volume Action   12/25/23 1046 10 mL Given   heparin sodium (porcine) injection (Units)  Total dose: 10,000 Units Date/Time Rate/Dose/Volume Action   12/25/23 1108 10,000 Units Given   clopidogrel (PLAVIX) tablet (mg)  Total dose: 600 mg Date/Time Rate/Dose/Volume Action   12/25/23 1112 600 mg Given   famotidine (PEPCID) IVPB 20 mg premix (mg)  Total dose: 20 mg Date/Time Rate/Dose/Volume Action   12/25/23 1115 20 mg New Bag/Given   iohexol (OMNIPAQUE) 350 MG/ML injection (mL)  Total volume: 80 mL Date/Time Rate/Dose/Volume Action   12/25/23 1131 80 mL Given    Sedation Time  Sedation Time Physician-1: 48 minutes 31 seconds Contrast     Administrations occurring from 1024 to 1148 on 12/25/23:  Medication Name Total Dose  iohexol (OMNIPAQUE) 350 MG/ML injection 80 mL   Radiation/Fluoro  Fluoro time: 13.2 (min) DAP: 16.5  (Gycm2) Cumulative Air Kerma: 284.5 (mGy) Complications  Complications documented before study signed (12/25/2023 11:48 AM)   No complications were associated with this study.  Documented by Ancil Linsey, RT - 12/25/2023 11:32 AM     Coronary Findings  Diagnostic Dominance: Co-dominant Left Main  Mid LM to Dist LM lesion is 30% stenosed.    Left Anterior Descending  Vessel is large.  Prox LAD to Mid LAD lesion is 30% stenosed.    Left Circumflex  Vessel is large.  Mid Cx to Dist Cx lesion is 30% stenosed.    Right Coronary Artery  Vessel is large.  Prox RCA lesion is 99% stenosed.  Mid RCA lesion is 30% stenosed.    Intervention   Prox RCA lesion  Stent  [place] GUIDE 6FR 3DRC guide catheter was inserted. Lesion crossed with guidewire using a WIRE ASAHI PROWATER 180CM. Pre-stent angioplasty was performed using a BALLN EMERGE MR 2.0X12. A drug-eluting stent was successfully placed using a STENT ONYX FRONTIER 2.75X15. Stent strut is well apposed. Post-stent angioplasty was performed using a BALL SAPPHIRE NC24 3.0X12.  Post-Intervention Lesion Assessment  The intervention was successful. Pre-interventional TIMI flow is 3. Post-intervention TIMI flow is 3. No complications occurred at this lesion.  There is a 0% residual stenosis post intervention.     Coronary Diagrams  Diagnostic Dominance: Co-dominant  Intervention    Implants   Permanent Stent  Stent Onyx Frontier 2.75x15 [phone] - Implanted  Inventory item: Gilda Crease 6.64Q03 Model/Cat number: [id number]  Manufacturer: [employer] Lot number: [id number]  Device identifier: [id number] Device identifier type: GS1  GUDID Information  Request status Successful    Brand name: Onyx FrontierT Version/Model: [id number]  Company name: [employer] MRI safety info as of 12/25/23: MR Conditional  Contains dry or latex rubber: No    GMDN P.T. name: Drug-eluting  coronary artery stent, non-bioabsorbable-polymer-coated    As of 12/25/2023  Status: Implanted     Syngo Images   Show images for CARDIAC CATHETERIZATION Images on Long Term Storage   Show images for Silviano, Neuser. Link to Procedure Log  Procedure  Log    Hemo Data  Flowsheet Row Most Recent Value  Fick Cardiac Output 5.97 L/min  Fick Cardiac Output Index 3.06 (L/min)/BSA  RA A Wave 7 mmHg  RA V Wave 5 mmHg  RA Mean 4 mmHg  RV Systolic Pressure 27 mmHg  RV Diastolic Pressure 0 mmHg  RV EDP 5 mmHg  PA Systolic Pressure 28 mmHg  PA Diastolic Pressure 10 mmHg  PA Mean 17 mmHg  PW A Wave 10 mmHg  PW V Wave 7 mmHg  PW Mean 6 mmHg  AO Systolic Pressure 109 mmHg  AO Diastolic Pressure 63 mmHg  AO Mean 83 mmHg  QP/QS 1  TPVR Index 5.55 HRUI  TSVR Index 27.09 HRUI  PVR SVR Ratio 0.14  TPVR/TSVR Ratio 0.2   ADDENDUM REPORT: 01/06/2024 18:20   ADDENDUM: OVER-READ INTERPRETATION  CT CHEST   The following report is an over-read performed by radiologist Dr. Oley Balm of [facility]. This over-read does not include interpretation of cardiac or coronary anatomy or pathology; that interpretation by cardiologist is attached.   Cardiovascular: No pericardial   effusion. The RV is nondilated. Dilated central pulmonary arteries   suggesting pulmonary hypertension. No discrete filling defects to   suggest acute PE. 3-vessel coronary calcifications. Aortic valve   leaflet coarse calcifications. Scattered calcified aortic plaque   through the thoracic aorta without aneurysm. Classic 3-vessel   brachiocephalic arterial origin anatomy without significant   stenosis. Bilateral carotid bifurcation calcified plaque without   high-grade stenosis.   Mediastinum/Nodes: No mass or adenopathy.   Lungs/Pleura: No pleural effusion. No pneumothorax. Mild dependent   atelectasis posteriorly in the lower lobes.   Musculoskeletal: Chronic mild T12 vertebral  compression deformity.   Review of the MIP images confirms the above findings.   IMPRESSION: 1. No acute findings. 2. Dilated central pulmonary arteries suggesting pulmonary hypertension. 3. Coronary and aortic Atherosclerosis (ICD10-I70.0).     Electronically Signed   By: Corlis Leak M.D.   On: 01/06/2024 18:20    Addended by Oley Balm, MD on 01/06/2024  6:22 PM    Study Result  Narrative & Impression  CLINICAL DATA:  Severe Aortic Stenosis.   EXAM: Cardiac TAVR CT   TECHNIQUE: A non-contrast, gated CT scan was obtained with axial slices of 3 mm through the heart for aortic valve calcium scoring. A 120 kV retrospective, gated, contrast cardiac scan was obtained. Gantry rotation speed was 250 msecs and collimation was 0.6 mm. Nitroglycerin was not given. The 3D data set was reconstructed in 5% intervals of the 0-95% of the R-R cycle. Systolic and diastolic phases were analyzed on a dedicated workstation using MPR, MIP, and VRT modes. The patient received 100 cc of contrast.   FINDINGS: Image quality: Excellent.   Noise artifact is: Limited.   Valve Morphology: Tricuspid aortic valve with diffuse severe calcifications. Bulky calcification of the NCC and RCC extending into the annulus and LVOT.   Aortic Valve Calcium score: 4785   Aortic annular dimension:   Phase assessed: 35%   Annular area: 485 mm2   Annular perimeter: 79.4 mm   Max diameter: 28.1 mm   Min diameter: 23.1 mm   Annular and subannular calcification: Moderate, single protruding calcification under the NCC. Single, layered calcium under the RCC.   Membranous septum length: 6.0 mm   Optimal coplanar projection: RAO 12 CAU 18   Coronary Artery Height above Annulus:   Left Main: 14.5 mm   Right Coronary: 19.4 mm  Sinus of Valsalva Measurements:   Non-coronary: 32 mm   Right-coronary: 32 mm   Left-coronary: 33 mm   Sinus of Valsalva Height:   Non-coronary: 24.8 mm    Right-coronary: 23.2 mm   Left-coronary: 22.7 mm   Sinotubular Junction: 29 mm   Ascending Thoracic Aorta: 33 mm   Coronary Arteries: Normal coronary origin. Right dominance. The study was performed without use of NTG and is insufficient for plaque evaluation. Please refer to recent cardiac catheterization for coronary assessment. Stent noted in RCA.   Cardiac Morphology:   Right Atrium: Right atrial size is within normal limits.   Right Ventricle: The right ventricular cavity is within normal limits.   Left Atrium: Left atrial size is normal in size with no left atrial appendage filling defect.   Left Ventricle: The ventricular cavity size is within normal limits.   Pulmonary arteries: Dilated pulmonary artery suggestive of pulmonary hypertension.   Pulmonary veins: Normal pulmonary venous drainage.   Pericardium: Normal thickness with no significant effusion or calcium present.   Mitral Valve: The mitral valve is normal structure without significant calcification.   Extra-cardiac findings: See attached radiology report for non-cardiac structures.   IMPRESSION: 1. Tricuspid aortic valve with diffuse severe calcifications. Calcium score 4785. Bulky calcification of the NCC and RCC extending into the annulus and LVOT.   2. Annular measurements support a 26 mm S3 or 29 mm Evolut Pro.   3. Moderate, single protruding calcification under the NCC. Single, layered calcium under the RCC.   4. Sufficient coronary to annulus distance.   5. Optimal Fluoroscopic Angle for Delivery: RAO 12 CAU 18   6. Dilated pulmonary artery suggestive of pulmonary hypertension.   Gerri Spore T. Flora Lipps, MD   Electronically Signed: By: Lennie Odor M.D. On: 01/02/2024 15:42      Narrative & Impression  CLINICAL DATA:  Preop planning, aortic valve replacement   EXAM: CT ANGIOGRAPHY CHEST, ABDOMEN AND PELVIS   TECHNIQUE: Multidetector CT imaging through the chest, abdomen and  pelvis was performed using the standard protocol during bolus administration of intravenous contrast. Multiplanar reconstructed images and MIPs were obtained and reviewed to evaluate the vascular anatomy.   RADIATION DOSE REDUCTION: This exam was performed according to the departmental dose-optimization program which includes automated exposure control, adjustment of the mA and/or kV according to patient size and/or use of iterative reconstruction technique.   CONTRAST:  95mL OMNIPAQUE IOHEXOL 350 MG/ML SOLN   COMPARISON:  12/29/2013   FINDINGS: CTA CHEST FINDINGS   Cardiovascular: Heart size is upper limits normal. No pericardial effusion. The RV is nondilated. Dilated central pulmonary arteries suggesting pulmonary hypertension. No discrete filling defects to suggest acute PE. 3-vessel coronary calcifications. Aortic valve leaflet coarse calcifications. Scattered calcified aortic plaque through the thoracic aorta without aneurysm. Classic 3-vessel brachiocephalic arterial origin anatomy without significant stenosis. Bilateral carotid bifurcation calcified plaque without high-grade stenosis.   Mediastinum/Nodes: No mass or adenopathy.   Lungs/Pleura: No pleural effusion. No pneumothorax. Mild dependent atelectasis posteriorly in the lower lobes.   Musculoskeletal: Chronic mild T12 vertebral compression deformity.   Review of the MIP images confirms the above findings.   CTA ABDOMEN AND PELVIS FINDINGS   VASCULAR   Aorta: Extensive calcified atheromatous plaque. No dissection or stenosis. Left lateral penetrating atheromatous ulcer in the infrarenal segment with associated 3 cm saccular aneurysm, containing eccentric mural thrombus. Aorta returns to normal caliber above the bifurcation.   Celiac: Left gastric artery arises directly from the arch, anatomic variant.  Mild partially calcified nonocclusive plaque in the origin of the celiac axis, with patent splenic and  common hepatic arteries.   SMA: Partially calcified ostial plaque without stenosis, patent distally with classic branch anatomy.   Renals: Single right, widely patent. Duplicated left, Co dominant, both patent.   IMA: Patent, arising from the inferior margin of the saccular aneurysm.   Inflow: Scattered calcified plaque without high-grade stenosis. Moderate tortuosity. No aneurysm or dissection. Visualized proximal outflow is patent bilaterally.   Veins: No obvious venous abnormality within the limitations of this arterial phase study.   Review of the MIP images confirms the above findings.   NON-VASCULAR   Hepatobiliary: No focal liver abnormality is seen. No gallstones, gallbladder wall thickening, or biliary dilatation.   Pancreas: Unremarkable. No pancreatic ductal dilatation or surrounding inflammatory changes.   Spleen: Normal in size without focal abnormality.   Adrenals/Urinary Tract: Adrenal glands are unremarkable. Kidneys are normal, without renal calculi, focal lesion, or hydronephrosis. Bladder is unremarkable.   Stomach/Bowel: The stomach is physiologically distended, without acute finding. Small-bowel decompressed. Normal appendix. The colon is partially distended, with scattered descending and numerous sigmoid diverticula; no adjacent inflammatory change.   Lymphatic: No abdominal or pelvic adenopathy.   Reproductive: Mild prostate enlargement.   Other: No ascites.  No free air.   Musculoskeletal: Stable degenerative disc disease L4-S1.   Review of the MIP images confirms the above findings.   IMPRESSION: 1. No acute findings. 2. 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 Recommend referral to or continued care with vascular specialist. (Ref.: J Vasc Surg. 2018; 67:2-77 and J Am Coll Radiol 2013;10(10):789-794.) 3. Coronary and aortic Atherosclerosis (ICD10-I70.0).     Electronically  Signed   By: Corlis Leak M.D.   On: 01/06/2024 18:14      Impression:  This 83 year old gentleman has stage C, asymptomatic, critical aortic stenosis with a heavily calcified and thickened aortic valve with restricted leaflet mobility.  The mean gradient is 54.5 mmHg with a dimensionless index of 0.21 and a valve area of 0.66 cm.  Left ventricular systolic function is normal.  He did recently have an episode where he fell off an elliptical machine at the gym after stopping a workout and there was some suggestion that he may have gotten dizzy before falling.  He has NYHA class I symptoms. I agree that aortic valve replacement is indicated in this patient to prevent further left ventricular hypertrophy and progressive left ventricular dysfunction as well as to prevent development of worsening symptoms or sudden death.  Given his advanced age I think transcatheter aortic valve replacement would be the best option for treating him.  His gated cardiac CTA shows anatomy suitable for TAVR using a 26 mm SAPIEN 3 valve.  His abdominal and pelvic CTA shows adequate pelvic vascular anatomy to allow transfemoral insertion.  His valve is very calcified and we could consider using a Sentinel device for embolic protection although he has a very tortuous innominate artery which may make placement difficult.  The patient and his wife were counseled at length regarding treatment alternatives for management of severe symptomatic aortic stenosis. The risks and benefits of surgical intervention has been discussed in detail. Long-term prognosis with medical therapy was discussed. Alternative approaches such as conventional surgical aortic valve replacement, transcatheter aortic valve replacement, and palliative medical therapy were compared and contrasted at length. This discussion was placed in the context of the patient's own specific clinical presentation and past medical  history. All of their questions have been addressed.    Following the decision to proceed with transcatheter aortic valve replacement, a discussion was held regarding what types of management strategies would be attempted intraoperatively in the event of life-threatening complications, including whether or not the patient would be considered a candidate for the use of cardiopulmonary bypass and/or conversion to open sternotomy for attempted surgical intervention.  I think he would be a candidate for emergent sternotomy to manage any intraoperative complications.  The patient is aware of the fact that transient use of cardiopulmonary bypass may be necessary. The patient has been advised of a variety of complications that might develop including but not limited to risks of death, stroke, paravalvular leak, aortic dissection or other major vascular complications, aortic annulus rupture, device embolization, cardiac rupture or perforation, mitral regurgitation, acute myocardial infarction, arrhythmia, heart block or bradycardia requiring permanent pacemaker placement, congestive heart failure, respiratory failure, renal failure, pneumonia, infection, other late complications related to structural valve deterioration or migration, or other complications that might ultimately cause a temporary or permanent loss of functional independence or other long term morbidity. The patient provides full informed consent for the procedure as described and all questions were answered.      Plan:  He will be scheduled for transfemoral TAVR using a SAPIEN 3 valve on 02/24/2024.  I spent 60 minutes performing this consultation and > 50% of this time was spent face to face counseling and coordinating the care of this patient's critical aortic stenosis.   Alleen Borne, MD 01/28/2024

## 2024-02-02 ENCOUNTER — Other Ambulatory Visit: Payer: Self-pay

## 2024-02-02 ENCOUNTER — Telehealth: Payer: PPO | Admitting: Physician Assistant

## 2024-02-02 DIAGNOSIS — B9689 Other specified bacterial agents as the cause of diseases classified elsewhere: Secondary | ICD-10-CM

## 2024-02-02 DIAGNOSIS — J019 Acute sinusitis, unspecified: Secondary | ICD-10-CM | POA: Diagnosis not present

## 2024-02-02 DIAGNOSIS — I350 Nonrheumatic aortic (valve) stenosis: Secondary | ICD-10-CM

## 2024-02-02 MED ORDER — BENZONATATE 100 MG PO CAPS: 100.0000 mg | ORAL_CAPSULE | Freq: Three times a day (TID) | ORAL | 0 refills | Status: DC | PRN

## 2024-02-02 MED ORDER — AMOXICILLIN-POT CLAVULANATE 875-125 MG PO TABS: 1.0000 | ORAL_TABLET | Freq: Two times a day (BID) | ORAL | 0 refills | Status: DC

## 2024-02-02 MED ORDER — PREDNISONE 50 MG PO TABS: ORAL_TABLET | ORAL | 0 refills | Status: DC

## 2024-02-02 NOTE — Patient Instructions (Signed)
 [facility] Anthony Noble., thank you for joining Anthony Loveless, PA-C for today's virtual visit.  While this provider is not your primary care provider (PCP), if your PCP is located in our provider database this encounter information will be shared with them immediately following your visit.   A [place] MyChart account gives you access to today's visit and all your visits, tests, and labs performed at [facility] " click here if you don't have a [place] MyChart account or go to [email or web address]  Consent: (Patient) Anthony Noble Anthony Noble. provided verbal consent for this virtual visit at the beginning of the encounter.  Current Medications:  Current Outpatient Medications:    amoxicillin-clavulanate (AUGMENTIN) 875-125 MG tablet, Take 1 tablet by mouth 2 (two) times daily., Disp: 14 tablet, Rfl: 0   benzonatate (TESSALON) 100 MG capsule, Take 1 capsule (100 mg total) by mouth 3 (three) times daily as needed., Disp: 30 capsule, Rfl: 0   acetaminophen (TYLENOL) 650 MG CR tablet, Take 650 mg by mouth every 8 (eight) hours as needed for pain., Disp: , Rfl:    atorvastatin (LIPITOR) 80 MG tablet, Take 1 tablet (80 mg total) by mouth daily., Disp: 90 tablet, Rfl: 1   BAYER ASPIRIN PO, Take 81 mg by mouth daily., Disp: , Rfl:    brimonidine (ALPHAGAN) 0.2 % ophthalmic solution, Place 1 drop into the right eye 3 (three) times daily., Disp: , Rfl:    calcium elemental as carbonate (TUMS ULTRA 1000) 400 MG chewable tablet, Chew 1,000 mg by mouth at bedtime., Disp: , Rfl:    cholecalciferol (VITAMIN D3) 25 MCG (1000 UNIT) tablet, Take 1,000 Units by mouth daily., Disp: , Rfl:    clopidogrel (PLAVIX) 75 MG tablet, Take 1 tablet (75 mg total) by mouth daily., Disp: 90 tablet, Rfl: 1   Coenzyme Q10 (COQ10) 200 MG CAPS, Take 400 mg by mouth daily., Disp: , Rfl:    Cranberry 500 MG CAPS, Take 500 mg by mouth daily., Disp: , Rfl:    dorzolamide-timolol (COSOPT) 22.3-6.8 MG/ML  ophthalmic solution, Place 1 drop into the right eye 2 (two) times daily., Disp: , Rfl:    ferrous sulfate 325 (65 FE) MG tablet, Take 325 mg by mouth daily., Disp: , Rfl:    hyoscyamine (LEVSIN SL) 0.125 MG SL tablet, DISSOLVE 1 TABLET IN MOUTH EVERY 8 HOURS AS NEEDED, Disp: 120 tablet, Rfl: 2   levothyroxine (SYNTHROID) 137 MCG tablet, Take 1 tablet (137 mcg total) by mouth daily before breakfast., Disp: 90 tablet, Rfl: 1   loratadine (CLARITIN) 10 MG tablet, Take 10 mg by mouth daily., Disp: , Rfl:    Magnesium 400 MG TABS, Take 250 mg by mouth daily., Disp: , Rfl:    Multiple Vitamin (MULTIVITAMIN WITH MINERALS) TABS tablet, Take 1 tablet by mouth daily. Centrum silver, Disp: , Rfl:    Multiple Vitamins-Minerals (HAIR SKIN NAILS PO), Take 1 tablet by mouth daily., Disp: , Rfl:    Netarsudil-Latanoprost (ROCKLATAN) 0.02-0.005 % SOLN, Place 1 drop into both eyes at bedtime., Disp: , Rfl:    NON FORMULARY, Take 10 mg by mouth at bedtime. CBD Gummy hemp isolate, Disp: , Rfl:    NON FORMULARY, Take 2 tablets by mouth 3 (three) times daily. Flavonoid, Disp: , Rfl:    Omega-3 Fatty Acids (FISH OIL) 1000 MG CAPS, Take 1,000 mg by mouth daily., Disp: , Rfl:    predniSONE (DELTASONE) 50 MG tablet, Take one tablet by mouth at 8:30 PM on  3/3, Take one tablet by mouth at 2:30 AM on 3/4., Disp: 2 tablet, Rfl: 0   sodium chloride (OCEAN) 0.65 % SOLN nasal spray, Place 1 spray into both nostrils daily. May continue to use throughout the day as needed for congestion, Disp: , Rfl:    Medications ordered in this encounter:  Meds ordered this encounter  Medications   amoxicillin-clavulanate (AUGMENTIN) 875-125 MG tablet    Sig: Take 1 tablet by mouth 2 (two) times daily.    Dispense:  14 tablet    Refill:  0    Supervising Provider:   Merrilee Jansky [[id number]]   benzonatate (TESSALON) 100 MG capsule    Sig: Take 1 capsule (100 mg total) by mouth 3 (three) times daily as needed.    Dispense:  30 capsule     Refill:  0    Supervising Provider:   Merrilee Jansky [[id number]]     *If you need refills on other medications prior to your next appointment, please contact your pharmacy*  Follow-Up: Call back or seek an in-person evaluation if the symptoms worsen or if the condition fails to improve as anticipated.  [facility] [phone]  Other Instructions Sinus Infection, Adult A sinus infection, also called sinusitis, is inflammation of your sinuses. Sinuses are hollow spaces in the bones around your face. Your sinuses are located: Around your eyes. In the middle of your forehead. Behind your nose. In your cheekbones. Mucus normally drains out of your sinuses. When your nasal tissues become inflamed or swollen, mucus can become trapped or blocked. This allows bacteria, viruses, and fungi to grow, which leads to infection. Most infections of the sinuses are caused by a virus. A sinus infection can develop quickly. It can last for up to 4 weeks (acute) or for more than 12 weeks (chronic). A sinus infection often develops after a cold. What are the causes? This condition is caused by anything that creates swelling in the sinuses or stops mucus from draining. This includes: Allergies. Asthma. Infection from bacteria or viruses. Deformities or blockages in your nose or sinuses. Abnormal growths in the nose (nasal polyps). Pollutants, such as chemicals or irritants in the air. Infection from fungi. This is rare. What increases the risk? You are more likely to develop this condition if you: Have a weak body defense system (immune system). Do a lot of swimming or diving. Overuse nasal sprays. Smoke. What are the signs or symptoms? The main symptoms of this condition are pain and a feeling of pressure around the affected sinuses. Other symptoms include: Stuffy nose or congestion that makes it difficult to breathe through your nose. Thick yellow or greenish drainage from your  nose. Tenderness, swelling, and warmth over the affected sinuses. A cough that may get worse at night. Decreased sense of smell and taste. Extra mucus that collects in the throat or the back of the nose (postnasal drip) causing a sore throat or bad breath. Tiredness (fatigue). Fever. How is this diagnosed? This condition is diagnosed based on: Your symptoms. Your medical history. A physical exam. Tests to find out if your condition is acute or chronic. This may include: Checking your nose for nasal polyps. Viewing your sinuses using a device that has a light (endoscope). Testing for allergies or bacteria. Imaging tests, such as an MRI or CT scan. In rare cases, a bone biopsy may be done to rule out more serious types of fungal sinus disease. How is this treated? Treatment for  a sinus infection depends on the cause and whether your condition is chronic or acute. If caused by a virus, your symptoms should go away on their own within 10 days. You may be given medicines to relieve symptoms. They include: Medicines that shrink swollen nasal passages (decongestants). A spray that eases inflammation of the nostrils (topical intranasal corticosteroids). Rinses that help get rid of thick mucus in your nose (nasal saline washes). Medicines that treat allergies (antihistamines). Over-the-counter pain relievers. If caused by bacteria, your health care provider may recommend waiting to see if your symptoms improve. Most bacterial infections will get better without antibiotic medicine. You may be given antibiotics if you have: A severe infection. A weak immune system. If caused by narrow nasal passages or nasal polyps, surgery may be needed. Follow these instructions at home: Medicines Take, use, or apply over-the-counter and prescription medicines only as told by your health care provider. These may include nasal sprays. If you were prescribed an antibiotic medicine, take it as told by your  health care provider. Do not stop taking the antibiotic even if you start to feel better. Hydrate and humidify  Drink enough fluid to keep your urine pale yellow. Staying hydrated will help to thin your mucus. Use a cool mist humidifier to keep the humidity level in your home above 50%. Inhale steam for 10-15 minutes, 3-4 times a day, or as told by your health care provider. You can do this in the bathroom while a hot shower is running. Limit your exposure to cool or dry air. Rest Rest as much as possible. Sleep with your head raised (elevated). Make sure you get enough sleep each night. General instructions  Apply a warm, moist washcloth to your face 3-4 times a day or as told by your health care provider. This will help with discomfort. Use nasal saline washes as often as told by your health care provider. Wash your hands often with soap and water to reduce your exposure to germs. If soap and water are not available, use hand sanitizer. Do not smoke. Avoid being around people who are smoking (secondhand smoke). Keep all follow-up visits. This is important. Contact a health care provider if: You have a fever. Your symptoms get worse. Your symptoms do not improve within 10 days. Get help right away if: You have a severe headache. You have persistent vomiting. You have severe pain or swelling around your face or eyes. You have vision problems. You develop confusion. Your neck is stiff. You have trouble breathing. These symptoms may be an emergency. Get help right away. Call 911. Do not wait to see if the symptoms will go away. Do not drive yourself to the hospital. Summary A sinus infection is soreness and inflammation of your sinuses. Sinuses are hollow spaces in the bones around your face. This condition is caused by nasal tissues that become inflamed or swollen. The swelling traps or blocks the flow of mucus. This allows bacteria, viruses, and fungi to grow, which leads to  infection. If you were prescribed an antibiotic medicine, take it as told by your health care provider. Do not stop taking the antibiotic even if you start to feel better. Keep all follow-up visits. This is important. This information is not intended to replace advice given to you by your health care provider. Make sure you discuss any questions you have with your health care provider. Document Revised: 10/30/2021 Document Reviewed: 10/30/2021 Elsevier Patient Education  2024 [employer].   If you have  been instructed to have an in-person evaluation today at a local Urgent Care facility, please use the link below. It will take you to a list of all of our available [facility], including address, phone number and hours of operation. Please do not delay care.  [facility]  If you or a family member do not have a primary care provider, use the link below to schedule a visit and establish care. When you choose a [facility] primary care physician or advanced practice provider, you gain a long-term partner in health. Find a Primary Care Provider  Learn more about [facility]'s in-office and virtual care options: [place] - Get Care Now

## 2024-02-02 NOTE — Progress Notes (Signed)
 Virtual Visit Consent   [name]., you are scheduled for a virtual visit with a [facility] provider today. Just as with appointments in the office, your consent must be obtained to participate. Your consent will be active for this visit and any virtual visit you may have with one of our providers in the next 365 days. If you have a MyChart account, a copy of this consent can be sent to you electronically.  As this is a virtual visit, video technology does not allow for your provider to perform a traditional examination. This may limit your provider's ability to fully assess your condition. If your provider identifies any concerns that need to be evaluated in person or the need to arrange testing (such as labs, EKG, etc.), we will make arrangements to do so. Although advances in technology are sophisticated, we cannot ensure that it will always work on either your end or our end. If the connection with a video visit is poor, the visit may have to be switched to a telephone visit. With either a video or telephone visit, we are not always able to ensure that we have a secure connection.  By engaging in this virtual visit, you consent to the provision of healthcare and authorize for your insurance to be billed (if applicable) for the services provided during this visit. Depending on your insurance coverage, you may receive a charge related to this service.  I need to obtain your verbal consent now. Are you willing to proceed with your visit today? [facility] [place]. has provided verbal consent on 02/02/2024 for a virtual visit (video or telephone). Margaretann Loveless, PA-C  Date: 02/02/2024 3:50 PM   Virtual Visit via Video Note   I, Margaretann Loveless, connected with  Anthony Noble.  ([id number], [birth year]) on 02/02/24 at  3:45 PM EST by a video-enabled telemedicine application and verified that I am speaking with the correct person using two identifiers.  Location: Patient:  Virtual Visit Location Patient: Home Provider: Virtual Visit Location Provider: Home Office   I discussed the limitations of evaluation and management by telemedicine and the availability of in person appointments. The patient expressed understanding and agreed to proceed.    History of Present Illness: Anthony Noble. is a 83 y.o. who identifies as a male who was assigned male at birth, and is being seen today for URI symptoms.  HPI: URI  This is a new problem. The current episode started in the past 7 days (Started feeling a little better but had symptoms return). The problem has been gradually worsening. Maximum temperature: 99.7. Associated symptoms include congestion, coughing, headaches, a plugged ear sensation (improved), rhinorrhea (and post nasal drainage), sinus pain, sneezing and a sore throat. Pertinent negatives include no diarrhea, ear pain, nausea or vomiting. Associated symptoms comments: Eyes watering. Treatments tried: mucinex, robitussin, tessalon perles. The treatment provided mild relief.   Negative at home covid 19 testing   Problems:  Patient Active Problem List   Diagnosis Date Noted   AAA (abdominal aortic aneurysm) without rupture (HCC) 01/13/2024   CAD S/P percutaneous coronary angioplasty 12/25/2023   Severe aortic stenosis    Bilateral carotid artery stenosis without cerebral infarction    Low tension glaucoma of right eye, severe stage 12/20/2021   Low-tension glaucoma of left eye, mild stage 12/20/2021   Senile purpura (HCC) 04/29/2018   Aortic regurgitation 06/28/2015   Malignant neoplasm of urinary bladder (HCC) 04/06/2014   History of  Guillain-Barre syndrome 04/06/2014   History of colonic polyps 04/06/2014   BPH (benign prostatic hyperplasia) 04/06/2014   Erectile dysfunction 04/06/2014   Hyperlipidemia 04/06/2014   Hypothyroidism 03/30/2013   BCG vaccine causing adverse effect in therapeutic use 02/06/2012   Carotid stenosis 07/25/2011    History of pleural effusion 2001    Allergies:  Allergies  Allergen Reactions   Bcg Live Other (See Comments)    Treatment pt had- shakes   Septra [Bactrim] Other (See Comments)    Muscle weakness, numbness   Sulfa Antibiotics Other (See Comments)    Severe weakness   Pertussis Vaccines     History of Gillian barrie    Iodinated Contrast Media Rash   Medications:  Current Outpatient Medications:    acetaminophen (TYLENOL) 650 MG CR tablet, Take 650 mg by mouth every 8 (eight) hours as needed for pain., Disp: , Rfl:    atorvastatin (LIPITOR) 80 MG tablet, Take 1 tablet (80 mg total) by mouth daily., Disp: 90 tablet, Rfl: 1   BAYER ASPIRIN PO, Take 81 mg by mouth daily., Disp: , Rfl:    brimonidine (ALPHAGAN) 0.2 % ophthalmic solution, Place 1 drop into the right eye 3 (three) times daily., Disp: , Rfl:    calcium elemental as carbonate (TUMS ULTRA 1000) 400 MG chewable tablet, Chew 1,000 mg by mouth at bedtime., Disp: , Rfl:    cholecalciferol (VITAMIN D3) 25 MCG (1000 UNIT) tablet, Take 1,000 Units by mouth daily., Disp: , Rfl:    clopidogrel (PLAVIX) 75 MG tablet, Take 1 tablet (75 mg total) by mouth daily., Disp: 90 tablet, Rfl: 1   Coenzyme Q10 (COQ10) 200 MG CAPS, Take 400 mg by mouth daily., Disp: , Rfl:    Cranberry 500 MG CAPS, Take 500 mg by mouth daily., Disp: , Rfl:    dorzolamide-timolol (COSOPT) 22.3-6.8 MG/ML ophthalmic solution, Place 1 drop into the right eye 2 (two) times daily., Disp: , Rfl:    ferrous sulfate 325 (65 FE) MG tablet, Take 325 mg by mouth daily., Disp: , Rfl:    hyoscyamine (LEVSIN SL) 0.125 MG SL tablet, DISSOLVE 1 TABLET IN MOUTH EVERY 8 HOURS AS NEEDED, Disp: 120 tablet, Rfl: 2   levothyroxine (SYNTHROID) 137 MCG tablet, Take 1 tablet (137 mcg total) by mouth daily before breakfast., Disp: 90 tablet, Rfl: 1   loratadine (CLARITIN) 10 MG tablet, Take 10 mg by mouth daily., Disp: , Rfl:    Magnesium 400 MG TABS, Take 250 mg by mouth daily., Disp: ,  Rfl:    Multiple Vitamin (MULTIVITAMIN WITH MINERALS) TABS tablet, Take 1 tablet by mouth daily. Centrum silver, Disp: , Rfl:    Multiple Vitamins-Minerals (HAIR SKIN NAILS PO), Take 1 tablet by mouth daily., Disp: , Rfl:    Netarsudil-Latanoprost (ROCKLATAN) 0.02-0.005 % SOLN, Place 1 drop into both eyes at bedtime., Disp: , Rfl:    NON FORMULARY, Take 10 mg by mouth at bedtime. CBD Gummy hemp isolate, Disp: , Rfl:    NON FORMULARY, Take 2 tablets by mouth 3 (three) times daily. Flavonoid, Disp: , Rfl:    Omega-3 Fatty Acids (FISH OIL) 1000 MG CAPS, Take 1,000 mg by mouth daily., Disp: , Rfl:    predniSONE (DELTASONE) 50 MG tablet, Take one tablet by mouth at 8:30 PM on 3/3, Take one tablet by mouth at 2:30 AM on 3/4., Disp: 2 tablet, Rfl: 0   sodium chloride (OCEAN) 0.65 % SOLN nasal spray, Place 1 spray into both nostrils daily. May  continue to use throughout the day as needed for congestion, Disp: , Rfl:   Observations/Objective: Patient is well-developed, well-nourished in no acute distress.  Resting comfortably at home.  Head is normocephalic, atraumatic.  No labored breathing.  Speech is clear and coherent with logical content.  Patient is alert and oriented at baseline.    Assessment and Plan: There are no diagnoses linked to this encounter. - Worsening symptoms that have not responded to OTC medications.  - Will give Augmentin and Tessalon perles - Continue allergy medications.  - Steam and humidifier can help - Stay well hydrated and get plenty of rest.  - Seek in person evaluation if no symptom improvement or if symptoms worsen   Follow Up Instructions: I discussed the assessment and treatment plan with the patient. The patient was provided an opportunity to ask questions and all were answered. The patient agreed with the plan and demonstrated an understanding of the instructions.  A copy of instructions were sent to the patient via MyChart unless otherwise noted below.     The patient was advised to call back or seek an in-person evaluation if the symptoms worsen or if the condition fails to improve as anticipated.    Margaretann Loveless, PA-C

## 2024-02-03 ENCOUNTER — Ambulatory Visit: Payer: PPO

## 2024-02-04 ENCOUNTER — Telehealth: Payer: Self-pay

## 2024-02-04 NOTE — Telephone Encounter (Signed)
 Video visit completed on 2/24 and patient is being treated with 7 day course of Augmentin due to sinusitis.  At this time the pt is noticing improvements in his symptoms and he denies any fever.  Will plan to proceed with TAVR as scheduled.

## 2024-02-06 ENCOUNTER — Encounter (HOSPITAL_COMMUNITY)
Admission: RE | Admit: 2024-02-06 | Discharge: 2024-02-06 | Disposition: A | Discharge status: Home / Self Care | Payer: PPO | Source: Ambulatory Visit | Attending: Cardiovascular Disease | Admitting: Cardiovascular Disease

## 2024-02-06 ENCOUNTER — Ambulatory Visit (HOSPITAL_COMMUNITY)
Admission: RE | Admit: 2024-02-06 | Discharge: 2024-02-06 | Disposition: A | Discharge status: Home / Self Care | Payer: PPO | Source: Ambulatory Visit | Attending: Cardiovascular Disease | Admitting: Cardiovascular Disease

## 2024-02-06 ENCOUNTER — Other Ambulatory Visit: Payer: Self-pay

## 2024-02-06 DIAGNOSIS — Z01818 Encounter for other preprocedural examination: Secondary | ICD-10-CM | POA: Insufficient documentation

## 2024-02-06 DIAGNOSIS — I700 Atherosclerosis of aorta: Secondary | ICD-10-CM | POA: Diagnosis not present

## 2024-02-06 DIAGNOSIS — Z955 Presence of coronary angioplasty implant and graft: Secondary | ICD-10-CM | POA: Diagnosis not present

## 2024-02-06 DIAGNOSIS — I350 Nonrheumatic aortic (valve) stenosis: Secondary | ICD-10-CM | POA: Diagnosis not present

## 2024-02-06 LAB — CBC
HCT: 40 % (ref 39.0–52.0)
Hemoglobin: 12.7 g/dL — ABNORMAL LOW (ref 13.0–17.0)
MCH: 28.9 pg (ref 26.0–34.0)
MCHC: 31.8 g/dL (ref 30.0–36.0)
MCV: 91.1 fL (ref 80.0–100.0)
Platelets: 185 10*3/uL (ref 150–400)
RBC: 4.39 MIL/uL (ref 4.22–5.81)
RDW: 13.3 % (ref 11.5–15.5)
WBC: 6.1 10*3/uL (ref 4.0–10.5)
nRBC: 0 % (ref 0.0–0.2)

## 2024-02-06 LAB — URINALYSIS, ROUTINE W REFLEX MICROSCOPIC
Bilirubin Urine: NEGATIVE
Glucose, UA: NEGATIVE mg/dL
Hgb urine dipstick: NEGATIVE
Ketones, ur: NEGATIVE mg/dL
Leukocytes,Ua: NEGATIVE
Nitrite: NEGATIVE
Protein, ur: NEGATIVE mg/dL
Specific Gravity, Urine: 1.014 (ref 1.005–1.030)
pH: 5 (ref 5.0–8.0)

## 2024-02-06 LAB — COMPREHENSIVE METABOLIC PANEL
ALT: 20 U/L (ref 0–44)
AST: 37 U/L (ref 15–41)
Albumin: 3.7 g/dL (ref 3.5–5.0)
Alkaline Phosphatase: 58 U/L (ref 38–126)
Anion gap: 11 (ref 5–15)
BUN: 16 mg/dL (ref 8–23)
CO2: 25 mmol/L (ref 22–32)
Calcium: 9.1 mg/dL (ref 8.9–10.3)
Chloride: 102 mmol/L (ref 98–111)
Creatinine, Ser: 1.07 mg/dL (ref 0.61–1.24)
GFR, Estimated: >60 mL/min (ref >60)
Glucose, Bld: 106 mg/dL — ABNORMAL HIGH (ref 70–99)
Potassium: 4.4 mmol/L (ref 3.5–5.1)
Sodium: 138 mmol/L (ref 135–145)
Total Bilirubin: 1 mg/dL (ref 0.0–1.2)
Total Protein: 6.8 g/dL (ref 6.5–8.1)

## 2024-02-06 LAB — TYPE AND SCREEN
ABO/RH(D): O POS
Antibody Screen: NEGATIVE

## 2024-02-06 LAB — SURGICAL PCR SCREEN
MRSA, PCR: NEGATIVE
Staphylococcus aureus: NEGATIVE

## 2024-02-06 NOTE — Progress Notes (Signed)
 PT-INR sample for lab appointment hemolyzed. Julieta Gutting, RN with TAVR team made aware. Will reorder for DOS

## 2024-02-06 NOTE — Progress Notes (Signed)
 Patient signed all consents at PAT lab appointment. CHG soap and instructions were given to patient and patients wife, Stark Bray. CHG surgical prep reviewed with patient and all questions answered.  Patients chart send to anesthesia for review. Pt has a sinus infection that TAVR team is aware of. Symptoms at lab appointment runny nose and cough. All drainage/phlegm is clear. Julieta Gutting, RN made aware and still okay to proceed for surgery on Tuesday.

## 2024-02-09 MED ORDER — DEXMEDETOMIDINE HCL IN NACL 400 MCG/100ML IV SOLN
0.1000 ug/kg/h | INTRAVENOUS | Status: AC
  Administered 2024-02-10: 1 ug/kg/h via INTRAVENOUS
  Administered 2024-02-10: 41.32 ug via INTRAVENOUS
  Filled 2024-02-09: qty 100

## 2024-02-09 MED ORDER — MAGNESIUM SULFATE 50 % IJ SOLN
40.0000 meq | INTRAMUSCULAR | Status: DC
  Filled 2024-02-09: qty 9.85

## 2024-02-09 MED ORDER — NOREPINEPHRINE 4 MG/250ML-% IV SOLN
0.0000 ug/min | INTRAVENOUS | Status: DC
  Filled 2024-02-09: qty 250

## 2024-02-09 MED ORDER — CEFAZOLIN SODIUM-DEXTROSE 2-4 GM/100ML-% IV SOLN
2.0000 g | INTRAVENOUS | Status: AC
Start: 2024-02-10 — End: 2024-02-11
  Administered 2024-02-10: 2 g via INTRAVENOUS
  Filled 2024-02-09 (×2): qty 100

## 2024-02-09 MED ORDER — POTASSIUM CHLORIDE 2 MEQ/ML IV SOLN
80.0000 meq | INTRAVENOUS | Status: DC
  Filled 2024-02-09: qty 40

## 2024-02-09 MED ORDER — HEPARIN 30,000 UNITS/1000 ML (OHS) CELLSAVER SOLUTION
Status: DC
  Filled 2024-02-09: qty 1000

## 2024-02-09 NOTE — H&P (Signed)
 [address]       [place] [address]             [phone]      Cardiothoracic Surgery Admission History and Physical   PCP is Dettinger, Elige Radon, MD Referring Provider is Verne Carrow, MD Primary Cardiologist is Reatha Harps, MD   Reason for admission:  Severe aortic stenosis   HPI:   The patient is an 83 year old gentleman with a history of CVA with bilateral carotid artery stenosis, hyperlipidemia, hypothyroidism, coronary artery disease status post recent PCI/DES x 1 to the proximal RCA, history of bladder cancer, remote pleural effusion status post VATS drainage in 2001 complicated by development of postoperative Guillain-Barr syndrome that completely resolved after about 6 weeks, and severe aortic stenosis followed by Dr. Lennie Odor.  He had an echocardiogram in June 2024 showing severe aortic stenosis with a mean gradient of 53 mmHg and a valve area of 0.7 cm.  Dimensionless index was 0.22 with mild aortic insufficiency.  Left ventricular ejection fraction was 60 to 65%.  The patient was asymptomatic but TAVR was recommended due to his high gradient.  The patient wanted to wait.  He had a follow-up echocardiogram on 11/25/2023 which showed a mean gradient of 54.5 mmHg with a valve area of 0.66 cm by VTI and a dimensionless index of 0.2.  Left ventricular ejection fraction remains normal with moderate LVH and grade 1 diastolic dysfunction.  TAVR was again recommended by Dr. Flora Lipps and the patient agreed to proceed with workup.   He is married and lives with his wife.  He reports going to the gym several days per week and walks on treadmill and uses an elliptical machine.  He denies any shortness of breath or fatigue.  He denies dizziness and syncope although his wife reports an episode a few weeks ago where he was working out on elliptical machine and suddenly fell off the side to the floor injuring his left arm and left ankle.  He thinks he  tripped but his wife thinks that he probably got dizzy.  He denies any orthopnea or peripheral edema.       Past Medical History:  Diagnosis Date   Anemia     Bilateral carotid artery stenosis without cerebral infarction      vascular--- dr Chestine Spore;   hx CVA;  last duplex  in epic 02-11-2023 left ICA 1-39%,  righrt ICA 40-59%,  bilateral ECA >50%   , pt to maintain on ASA 81mg  and statin   BPH (benign prostatic hypertrophy) with urinary obstruction      urologist--- dr Annabell Howells   CAD S/P percutaneous coronary angioplasty 12/25/2023    s/p PTCA/DES x 1 proximal RCA   Coronary artery disease     Diverticulosis of colon     Glaucoma     Guillain Barr syndrome (HCC) 2001   History of bladder cancer 2004    s/p TURBT  and BCG treatment's   History of colon polyps     History of pleural effusion 2001    s/p  VATS w/ drainage   History of vertebral fracture 2001    2001--   T12   Hyperlipidemia     Hypothyroidism      followed by pcp   Severe aortic stenosis     Tinnitus     Varicose veins of both lower extremities  Past Surgical History:  Procedure Laterality Date   CORONARY STENT INTERVENTION N/A 12/25/2023    Procedure: CORONARY STENT INTERVENTION;  Surgeon: Kathleene Hazel, MD;  Location: [facility];  Service: Cardiovascular;  Laterality: N/A;   CYSTO/  BILATERAL RETROGRADE PYLOGRAM/  RESECTION BLADDER TUMOR   07/30/2011    @[facility]   by dr Annabell Howells   CYSTOSCOPY WITH BIOPSY N/A 01/12/2015    Procedure: CYSTOSCOPY WITH BLADDER  BIOPSY, FULGERATION;  Surgeon: Anner Crete, MD;  Location: [facility];  Service: Urology;  Laterality: N/A;   CYSTOSCOPY WITH BIOPSY Bilateral 05/13/2023    Procedure: CYSTOSCOPY,BLADDER  BIOPSY WITH FULGURATION, BILATERAL RETROGRADE PYELOGRAM;  Surgeon: Bjorn Pippin, MD;  Location: [facility];  Service: Urology;  Laterality: Bilateral;  1 HR FOR CASE   ORIF RIGHT DISTAL RADIUS FX   12/31/2007    @[facility]   by dr c. blackman    RIGHT HEART CATH AND CORONARY ANGIOGRAPHY N/A 12/25/2023    Procedure: RIGHT HEART CATH AND CORONARY ANGIOGRAPHY;  Surgeon: Kathleene Hazel, MD;  Location: [facility];  Service: Cardiovascular;  Laterality: N/A;   TONSILLECTOMY        child   TRANSURETHRAL RESECTION OF BLADDER TUMOR WITH MITOMYCIN-C   06/07/2003    @[facility]  by dr Annabell Howells   VIDEO ASSISTED THORACOSCOPY (VATS)/EMPYEMA Right 05/01/2000    @[facility]   by dr Dorris Fetch;  and Drainage of effusion               Family History  Problem Relation Age of Onset   Other Mother          varicose veins   Cancer Mother          Ovarian   Varicose Veins Mother     Nephrolithiasis Mother     COPD Father     Hyperlipidemia Father     Cancer Maternal Grandmother     Bipolar disorder Daughter     Healthy Daughter     Hypertension Sister     Healthy Daughter     Colon cancer Neg Hx            Social History         Socioeconomic History   Marital status: Married      Spouse name: Not on file   Number of children: 2   Years of education: Not on file   Highest education level: Not on file  Occupational History   Occupation: retired      Comment: self employed-printing  Tobacco Use   Smoking status: Former      Current packs/day: 0.00      Average packs/day: 1 pack/day for 20.0 years (20.0 ttl pk-yrs)      Types: Cigarettes      Start date: 1963      Quit date: 1983      Years since quitting: 42.1      Passive exposure: Never   Smokeless tobacco: Never  Vaping Use   Vaping status: Never Used  Substance and Sexual Activity   Alcohol use: Yes      Alcohol/week: 2.0 standard drinks of alcohol      Types: 1 Shots of liquor, 1 Standard drinks or equivalent per week   Drug use: No      Comment: Hemp gummies   Sexual activity: Not on file  Other Topics Concern   Not on file  Social History Narrative    Lives at home with wife and daughter.  Social Drivers of Acupuncturist Strain:  Low Risk  (05/02/2020)    Overall Financial Resource Strain (CARDIA)     Difficulty of Paying Living Expenses: Not hard at all  Food Insecurity: No Food Insecurity (05/02/2020)    Hunger Vital Sign     Worried About Running Out of Food in the Last Year: Never true     Ran Out of Food in the Last Year: Never true  Transportation Needs: No Transportation Needs (05/02/2020)    PRAPARE - Therapist, art (Medical): No     Lack of Transportation (Non-Medical): No  Physical Activity: Sufficiently Active (05/02/2020)    Exercise Vital Sign     Days of Exercise per Week: 5 days     Minutes of Exercise per Session: 60 min  Stress: No Stress Concern Present (05/02/2020)    [name] of Occupational Health - Occupational Stress Questionnaire     Feeling of Stress : Not at all  Social Connections: Socially Integrated (05/02/2020)    Social Connection and Isolation Panel [NHANES]     Frequency of Communication with Friends and Family: More than three times a week     Frequency of Social Gatherings with Friends and Family: More than three times a week     Attends Religious Services: More than 4 times per year     Active Member of [employer] or Organizations: Yes     Attends Banker Meetings: More than 4 times per year     Marital Status: Married  Catering manager Violence: Not At Risk (05/02/2020)    Humiliation, Afraid, Rape, and Kick questionnaire     Fear of Current or Ex-Partner: No     Emotionally Abused: No     Physically Abused: No     Sexually Abused: No             Prior to Admission medications   Medication Sig Start Date End Date Taking? Authorizing Provider  acetaminophen (TYLENOL) 650 MG CR tablet Take 650 mg by mouth every 8 (eight) hours as needed for pain.     Yes [provider]  atorvastatin (LIPITOR) 80 MG tablet Take 1 tablet (80 mg total) by mouth daily. 01/07/24   Yes Janetta Hora, PA-C  BAYER ASPIRIN PO Take 81 mg by  mouth daily.     Yes [provider]  brimonidine (ALPHAGAN) 0.2 % ophthalmic solution Place 1 drop into the right eye 3 (three) times daily.     Yes [provider]  calcium elemental as carbonate (TUMS ULTRA 1000) 400 MG chewable tablet Chew 1,000 mg by mouth at bedtime.     Yes [provider]  cholecalciferol (VITAMIN D3) 25 MCG (1000 UNIT) tablet Take 1,000 Units by mouth daily.     Yes [provider]  clopidogrel (PLAVIX) 75 MG tablet Take 1 tablet (75 mg total) by mouth daily. 01/07/24   Yes Janetta Hora, PA-C  Coenzyme Q10 (COQ10) 200 MG CAPS Take 400 mg by mouth daily.     Yes [provider]  Cranberry 500 MG CAPS Take 500 mg by mouth daily.     Yes [provider]  dorzolamide-timolol (COSOPT) 22.3-6.8 MG/ML ophthalmic solution Place 1 drop into the right eye 2 (two) times daily.     Yes [provider]  ferrous sulfate 325 (65 FE) MG tablet Take 325 mg by mouth daily.  Yes [provider]  hyoscyamine (LEVSIN SL) 0.125 MG SL tablet DISSOLVE 1 TABLET IN MOUTH EVERY 8 HOURS AS NEEDED 03/20/23   Yes Dettinger, Elige Radon, MD  levothyroxine (SYNTHROID) 137 MCG tablet Take 1 tablet (137 mcg total) by mouth daily before breakfast. 11/10/23   Yes Dettinger, Elige Radon, MD  loratadine (CLARITIN) 10 MG tablet Take 10 mg by mouth daily.     Yes [provider]  Magnesium 400 MG TABS Take 250 mg by mouth daily.     Yes [provider]  Multiple Vitamin (MULTIVITAMIN WITH MINERALS) TABS tablet Take 1 tablet by mouth daily. Centrum silver     Yes [provider]  Multiple Vitamins-Minerals (HAIR SKIN NAILS PO) Take 1 tablet by mouth daily.     Yes [provider]  Netarsudil-Latanoprost (ROCKLATAN) 0.02-0.005 % SOLN Place 1 drop into both eyes at bedtime.     Yes [provider]  NON FORMULARY Take 10 mg by mouth at bedtime. CBD Gummy hemp isolate     Yes [provider]   NON FORMULARY Take 2 tablets by mouth 3 (three) times daily. Flavonoid     Yes [provider]  Omega-3 Fatty Acids (FISH OIL) 1000 MG CAPS Take 1,000 mg by mouth daily.     Yes [provider]  sodium chloride (OCEAN) 0.65 % SOLN nasal spray Place 1 spray into both nostrils daily. May continue to use throughout the day as needed for congestion     Yes [provider]            Current Outpatient Medications  Medication Sig Dispense Refill   acetaminophen (TYLENOL) 650 MG CR tablet Take 650 mg by mouth every 8 (eight) hours as needed for pain.       atorvastatin (LIPITOR) 80 MG tablet Take 1 tablet (80 mg total) by mouth daily. 90 tablet 1   BAYER ASPIRIN PO Take 81 mg by mouth daily.       brimonidine (ALPHAGAN) 0.2 % ophthalmic solution Place 1 drop into the right eye 3 (three) times daily.       calcium elemental as carbonate (TUMS ULTRA 1000) 400 MG chewable tablet Chew 1,000 mg by mouth at bedtime.       cholecalciferol (VITAMIN D3) 25 MCG (1000 UNIT) tablet Take 1,000 Units by mouth daily.       clopidogrel (PLAVIX) 75 MG tablet Take 1 tablet (75 mg total) by mouth daily. 90 tablet 1   Coenzyme Q10 (COQ10) 200 MG CAPS Take 400 mg by mouth daily.       Cranberry 500 MG CAPS Take 500 mg by mouth daily.       dorzolamide-timolol (COSOPT) 22.3-6.8 MG/ML ophthalmic solution Place 1 drop into the right eye 2 (two) times daily.       ferrous sulfate 325 (65 FE) MG tablet Take 325 mg by mouth daily.       hyoscyamine (LEVSIN SL) 0.125 MG SL tablet DISSOLVE 1 TABLET IN MOUTH EVERY 8 HOURS AS NEEDED 120 tablet 2   levothyroxine (SYNTHROID) 137 MCG tablet Take 1 tablet (137 mcg total) by mouth daily before breakfast. 90 tablet 1   loratadine (CLARITIN) 10 MG tablet Take 10 mg by mouth daily.       Magnesium 400 MG TABS Take 250 mg by mouth daily.       Multiple Vitamin (MULTIVITAMIN WITH MINERALS) TABS tablet Take 1 tablet by mouth daily. Centrum silver  Multiple  Vitamins-Minerals (HAIR SKIN NAILS PO) Take 1 tablet by mouth daily.       Netarsudil-Latanoprost (ROCKLATAN) 0.02-0.005 % SOLN Place 1 drop into both eyes at bedtime.       NON FORMULARY Take 10 mg by mouth at bedtime. CBD Gummy hemp isolate       NON FORMULARY Take 2 tablets by mouth 3 (three) times daily. Flavonoid       Omega-3 Fatty Acids (FISH OIL) 1000 MG CAPS Take 1,000 mg by mouth daily.       sodium chloride (OCEAN) 0.65 % SOLN nasal spray Place 1 spray into both nostrils daily. May continue to use throughout the day as needed for congestion          No current facility-administered medications for this visit.        Allergies       Allergies  Allergen Reactions   Bcg Live Other (See Comments)      Treatment pt had- shakes   Septra [Bactrim] Other (See Comments)      Muscle weakness, numbness   Sulfa Antibiotics Other (See Comments)      Severe weakness   Pertussis Vaccines        History of Gillian barrie     Iodinated Contrast Media Rash            Review of Systems:               General:                      normal appetite, normal energy, no weight gain, no weight loss, no fever             Cardiac:                       no chest pain with exertion, no chest pain at rest, noSOB with  exertion, no resting SOB, no PND, no orthopnea, no palpitations, no arrhythmia, no atrial fibrillation, no LE edema, no dizzy spells, no syncope             Respiratory:                 n shortness of breath, ono home oxygen, no productive cough, no dry cough, no bronchitis, no wheezing, no hemoptysis, no asthma, no pain with inspiration or cough, no sleep apnea, no CPAP at night             GI:                               no difficulty swallowing, no reflux, no frequent heartburn, no hiatal hernia, no abdominal pain, no constipation, no diarrhea, no hematochezia, no hematemesis, no melena             GU:                              no dysuria,  no frequency, no urinary tract  infection, no hematuria, no enlarged prostate, no kidney stones, no kidney disease             Vascular:                     no pain suggestive of claudication, no pain in feet, + leg cramps, + varicose veins, no DVT, no  non-healing foot ulcer             Neuro:                         + stroke, no TIA's, no seizures, no headaches, no temporary blindness one eye,  no slurred speech, no peripheral neuropathy, no chronic pain, no instability of gait, no memory/cognitive dysfunction             Musculoskeletal:         no arthritis, no joint swelling, no myalgias, no difficulty walking, normal mobility              Skin:                            + rash, no itching, no skin infections, no pressure sores or ulcerations             Psych:                         no anxiety, no depression, no nervousness, no unusual recent stress             Eyes:                           no blurry vision, no floaters, no recent vision changes, + wears glasses              ENT:                            no hearing loss, no loose or painful teeth, no dentures, last saw dentist 09/2023             Hematologic:               + easy bruising, no abnormal bleeding, no clotting disorder, no frequent epistaxis             Endocrine:                   no diabetes, does not check CBG's at home                            Physical Exam:               BP (!) 152/75   Pulse 61   Resp 18   Ht 5\' 8"  (1.727 m)   Wt 182 lb (82.6 kg)   SpO2 98%   BMI 27.67 kg/m              General:                      Elderly,  well-appearing             HEENT:                       Unremarkable, NCAT, PERLA, EOMI             Neck:                           no JVD, no bruits, no adenopathy              Chest:  clear to auscultation, symmetrical breath sounds, no wheezes, no rhonchi              CV:                              RRR, 3/6 systolic murmur RSB, no diastolic murmur             Abdomen:                     soft, non-tender, no masses              Extremities:                 warm, well-perfused, pedal pulses palpable, no lower extremity edema             Rectal/GU                   Deferred             Neuro:                         Grossly non-focal and symmetrical throughout             Skin:                            Clean and dry, no rashes, no breakdown   Diagnostic Tests:   ECHOCARDIOGRAM REPORT       Patient Name:   [facility] Daisy Blossom. Date of Exam: 11/25/2023  Medical Rec #:  [id number]              Height:       69.0 in  Accession #:    [id number]             Weight:       172.0 lb  Date of Birth:  [birth year]               BSA:          1.938 m  Patient Age:    82 years               BP:           124/64 mmHg  Patient Gender: M                      HR:           78 bpm.  Exam Location:  [address]   Procedure: 2D Echo, Cardiac Doppler and Color Doppler   Indications:    I35.0 AS    History:        Patient has prior history of Echocardiogram examinations,  most                 recent 05/12/2023. AS; Risk Factors:Dyslipidemia and Former                  Smoker.    Sonographer:    Samule Ohm RDCS  Referring Phys: [id number] CHRISTOPHER D MCALHANY   IMPRESSIONS     1. Left ventricular ejection fraction, by estimation, is 70 to 75%. The  left ventricle has hyperdynamic function. The left ventricle has no  regional wall motion abnormalities. There is moderate left ventricular  hypertrophy. Left ventricular diastolic  parameters are consistent with Grade I diastolic  dysfunction (impaired  relaxation).   2. Right ventricular systolic function is normal. The right ventricular  size is normal.   3. The mitral valve is normal in structure. No evidence of mitral valve  regurgitation. No evidence of mitral stenosis.   4. DI: 0.2. The aortic valve is calcified. There is severe calcifcation  of the aortic valve. There is severe thickening of the aortic valve.  Aortic valve  regurgitation is mild. Severe aortic valve stenosis. Aortic  valve area, by VTI measures 0.66 cm.  Aortic valve mean gradient measures 54.5 mmHg. Aortic valve Vmax measures  4.81 m/s.   5. The inferior vena cava is normal in size with greater than 50%  respiratory variability, suggesting right atrial pressure of 3 mmHg.   Comparison(s): Prior images reviewed side by side. Prior AV mean gradient  53 mmHg.   FINDINGS   Left Ventricle: Left ventricular ejection fraction, by estimation, is 70  to 75%. The left ventricle has hyperdynamic function. The left ventricle  has no regional wall motion abnormalities. The left ventricular internal  cavity size was normal in size.  There is moderate left ventricular hypertrophy. Left ventricular diastolic  parameters are consistent with Grade I diastolic dysfunction (impaired  relaxation).   Right Ventricle: The right ventricular size is normal. No increase in  right ventricular wall thickness. Right ventricular systolic function is  normal.   Left Atrium: Left atrial size was normal in size.   Right Atrium: Right atrial size was normal in size.   Pericardium: There is no evidence of pericardial effusion.   Mitral Valve: The mitral valve is normal in structure. No evidence of  mitral valve regurgitation. No evidence of mitral valve stenosis.   Tricuspid Valve: The tricuspid valve is normal in structure. Tricuspid  valve regurgitation is not demonstrated. No evidence of tricuspid  stenosis.   Aortic Valve: DI: 0.2. The aortic valve is calcified. There is severe  calcifcation of the aortic valve. There is severe thickening of the aortic  valve. Aortic valve regurgitation is mild. Aortic regurgitation PHT  measures 372 msec. Severe aortic stenosis   is present. Aortic valve mean gradient measures 54.5 mmHg. Aortic valve  peak gradient measures 92.5 mmHg. Aortic valve area, by VTI measures 0.66  cm.   Pulmonic Valve: The pulmonic valve was  normal in structure. Pulmonic valve  regurgitation is not visualized. No evidence of pulmonic stenosis.   Aorta: The aortic root is normal in size and structure.   Venous: The inferior vena cava is normal in size with greater than 50%  respiratory variability, suggesting right atrial pressure of 3 mmHg.   IAS/Shunts: No atrial level shunt detected by color flow Doppler.     LEFT VENTRICLE  PLAX 2D  LVIDd:         3.60 cm   Diastology  LVIDs:         2.50 cm   LV e' medial:    4.57 cm/s  LV PW:         1.40 cm   LV E/e' medial:  14.4  LV IVS:        1.40 cm   LV e' lateral:   3.92 cm/s  LVOT diam:     2.00 cm   LV E/e' lateral: 16.8  LV SV:         67  LV SV Index:   35  LVOT Area:     3.14 cm     RIGHT VENTRICLE  IVC  RV S prime:     12.80 cm/s  IVC diam: 1.10 cm  TAPSE (M-mode): 1.6 cm   LEFT ATRIUM             Index        RIGHT ATRIUM           Index  LA diam:        3.30 cm 1.70 cm/m   RA Pressure: 3.00 mmHg  LA Vol (A2C):   71.5 ml 36.90 ml/m  RA Area:     11.00 cm  LA Vol (A4C):   52.8 ml 27.25 ml/m  RA Volume:   27.40 ml  14.14 ml/m  LA Biplane Vol: 62.5 ml 32.25 ml/m   AORTIC VALVE  AV Area (Vmax):    0.60 cm  AV Area (Vmean):   0.62 cm  AV Area (VTI):     0.66 cm  AV Vmax:           481.00 cm/s  AV Vmean:          345.250 cm/s  AV VTI:            1.022 m  AV Peak Grad:      92.5 mmHg  AV Mean Grad:      54.5 mmHg  LVOT Vmax:         92.35 cm/s  LVOT Vmean:        67.900 cm/s  LVOT VTI:          0.214 m  LVOT/AV VTI ratio: 0.21  AI PHT:            372 msec    AORTA  Ao Root diam: 3.00 cm  Ao Asc diam:  3.40 cm   MITRAL VALVE                TRICUSPID VALVE  MV Area (PHT): 1.85 cm     Estimated RAP:  3.00 mmHg  MV Decel Time: 411 msec  MV E velocity: 65.90 cm/s   SHUNTS  MV A velocity: 142.00 cm/s  Systemic VTI:  0.21 m  MV E/A ratio:  0.46         Systemic Diam: 2.00 cm   Donato Schultz MD  Electronically signed by Donato Schultz MD   Signature Date/Time: 11/26/2023/11:54:54 AM        Final        Physicians   Panel Physicians Referring Physician Case Authorizing Physician  Kathleene Hazel, MD (Primary)        Procedures   RIGHT HEART CATH AND CORONARY ANGIOGRAPHY  CORONARY STENT INTERVENTION    Conclusion       Prox RCA lesion is 99% stenosed.   Mid Cx to Dist Cx lesion is 30% stenosed.   Mid LM to Dist LM lesion is 30% stenosed.   Prox LAD to Mid LAD lesion is 30% stenosed.   Mid RCA lesion is 30% stenosed.   A drug-eluting stent was successfully placed using a STENT ONYX FRONTIER 2.75X15.   Post intervention, there is a 0% residual stenosis.   Severe stenosis in the proximal RCA (co-dominant vessel) Successful PTCA/DES x 1 proximal RCA Mild non-obstructive disease in the distal left main, proximal LAD and mid Circumflex Normal right heart pressures.  Severe aortic stenosis by echo. I did not cross the valve today.    Recommendations: Will continue DAPT with ASA and Plavix. Same day post PCI discharge (6 hours). Continue workup for TAVR.  Indications   Coronary artery disease involving native coronary artery of native heart without angina pectoris [I25.10 (ICD-10-CM)]  Severe aortic stenosis [I35.0 (ICD-10-CM)]    Procedural Details   Technical Details Indication: 83 yo male with severe aortic stenosis, workup for TAVR.   Procedure: The risks, benefits, complications, treatment options, and expected outcomes were discussed with the patient. The patient and/or family concurred with the proposed plan, giving informed consent. The patient was sedated with Versed and Fentanyl. The IV catheter that was present in the right antecubital vein was changed for a 5 [place] sheath. Right heart catheterization performed with a balloon tipped catheter. The right wrist was prepped and draped in a sterile fashion. 1% lidocaine was used for local anesthesia. Using the modified Seldinger access technique, a  6 French Slender sheath was placed in the right radial artery. 3 mg Verapamil was given through the sheath. Weight based IV heparin was given. Standard diagnostic catheters were used to perform selective coronary angiography. I did not attempt to cross the aortic valve.   PCI Note: I engaged the RCA with a 3DRC guiding catheter. ACT over 300. Plavix 600 mg po x 1. I passed a Prowater IC wire down the RCA. I then dilated the severe stenosis in the proximal vessel with a 2.0 x 12 mm balloon. I then deployed a 2.75 x 15 mm Onyx DES in the proximal RCA. The stent was post-dilated with a 3.0 x 12 mm [place] balloon.   All catheter exchanges were performed over an exchange length guidewire. The sheath was removed from the right radial artery and a hemostasis band was applied at the arteriotomy site on the right wrist.      Estimated blood loss <50 mL.   During this procedure medications were administered to achieve and maintain moderate conscious sedation while the patient's heart rate, blood pressure, and oxygen saturation were continuously monitored and I was present face-to-face 100% of this time.    and Ancil Linsey Cardiovascular Specialist are independent, trained observers who assisted in the monitoring of the patient's level of consciousness.    Medications (Filter: Administrations occurring from 1024 to 1148 on 12/25/23) Heparin (Porcine) in NaCl 1000-0.9 UT/500ML-% SOLN (mL)  Total volume: 1,000 mL Date/Time Rate/Dose/Volume Action    12/25/23 1028 500 mL Given    1028 500 mL Given    fentaNYL (SUBLIMAZE) injection (mcg)  Total dose: 50 mcg Date/Time Rate/Dose/Volume Action    12/25/23 1041 25 mcg Given    1047 25 mcg Given    midazolam (VERSED) injection (mg)  Total dose: 2 mg Date/Time Rate/Dose/Volume Action    12/25/23 1041 1 mg Given    1047 1 mg Given    lidocaine (PF) (XYLOCAINE) 1 % injection (mL)  Total volume: 2 mL Date/Time Rate/Dose/Volume Action    12/25/23 1045 2 mL Given     Radial Cocktail/Verapamil only (mL)  Total volume: 10 mL Date/Time Rate/Dose/Volume Action    12/25/23 1046 10 mL Given    heparin sodium (porcine) injection (Units)  Total dose: 10,000 Units Date/Time Rate/Dose/Volume Action    12/25/23 1108 10,000 Units Given    clopidogrel (PLAVIX) tablet (mg)  Total dose: 600 mg Date/Time Rate/Dose/Volume Action    12/25/23 1112 600 mg Given    famotidine (PEPCID) IVPB 20 mg premix (mg)  Total dose: 20 mg Date/Time Rate/Dose/Volume Action    12/25/23 1115 20 mg New Bag/Given    iohexol (OMNIPAQUE) 350 MG/ML injection (mL)  Total volume: 80 mL Date/Time  Rate/Dose/Volume Action    12/25/23 1131 80 mL Given      Sedation Time   Sedation Time Physician-1: 48 minutes 31 seconds Contrast        Administrations occurring from 1024 to 1148 on 12/25/23:  Medication Name Total Dose  iohexol (OMNIPAQUE) 350 MG/ML injection 80 mL    Radiation/Fluoro   Fluoro time: 13.2 (min) DAP: 16.5 (Gycm2) Cumulative Air Kerma: 284.5 (mGy) Complications   Complications documented before study signed (12/25/2023 11:48 AM)    No complications were associated with this study.  Documented by Ancil Linsey, RT - 12/25/2023 11:32 AM      Coronary Findings   Diagnostic Dominance: Co-dominant Left Main  Mid LM to Dist LM lesion is 30% stenosed.    Left Anterior Descending  Vessel is large.  Prox LAD to Mid LAD lesion is 30% stenosed.    Left Circumflex  Vessel is large.  Mid Cx to Dist Cx lesion is 30% stenosed.    Right Coronary Artery  Vessel is large.  Prox RCA lesion is 99% stenosed.  Mid RCA lesion is 30% stenosed.    Intervention    Prox RCA lesion  Stent  CATH [place]ISTA GUIDE 6FR 3DRC guide catheter was inserted. Lesion crossed with guidewire using a WIRE ASAHI PROWATER 180CM. Pre-stent angioplasty was performed using a BALLN EMERGE MR 2.0X12. A drug-eluting stent was successfully placed using a STENT ONYX FRONTIER 2.75X15. Stent strut is well  apposed. Post-stent angioplasty was performed using a BALL SAPPHIRE NC24 3.0X12.  Post-Intervention Lesion Assessment  The intervention was successful. Pre-interventional TIMI flow is 3. Post-intervention TIMI flow is 3. No complications occurred at this lesion.  There is a 0% residual stenosis post intervention.      Coronary Diagrams   Diagnostic Dominance: Co-dominant  Intervention     Implants    Permanent Stent   Stent Onyx Frontier 2.75x15 [phone] - Implanted  Inventory item: Gilda Crease 0.98J19 Model/Cat number: [id number]  Manufacturer: [employer] Lot number: [id number]  Device identifier: [id number] Device identifier type: GS1  GUDID Information           Request status Successful      Brand name: Onyx FrontierT Version/Model: [id number]  Company name: [employer] MRI safety info as of 12/25/23: MR Conditional  Contains dry or latex rubber: No      GMDN P.T. name: Drug-eluting coronary artery stent, non-bioabsorbable-polymer-coated      As of 12/25/2023   Status: Implanted        Syngo Images    Show images for CARDIAC CATHETERIZATION Images on Long Term Storage    Show images for Rayder, Sullenger. Link to Procedure Log   Procedure Log    Hemo Data   Flowsheet Row Most Recent Value  Fick Cardiac Output 5.97 L/min  Fick Cardiac Output Index 3.06 (L/min)/BSA  RA A Wave 7 mmHg  RA V Wave 5 mmHg  RA Mean 4 mmHg  RV Systolic Pressure 27 mmHg  RV Diastolic Pressure 0 mmHg  RV EDP 5 mmHg  PA Systolic Pressure 28 mmHg  PA Diastolic Pressure 10 mmHg  PA Mean 17 mmHg  PW A Wave 10 mmHg  PW V Wave 7 mmHg  PW Mean 6 mmHg  AO Systolic Pressure 109 mmHg  AO Diastolic Pressure 63 mmHg  AO Mean 83 mmHg  QP/QS 1  TPVR Index 5.55 HRUI  TSVR Index 27.09 HRUI  PVR SVR Ratio 0.14  TPVR/TSVR Ratio 0.2  ADDENDUM REPORT: 01/06/2024 18:20   ADDENDUM: OVER-READ INTERPRETATION  CT CHEST   The following  report is an over-read performed by radiologist Dr. Oley Balm of [facility]. This over-read does not include interpretation of cardiac or coronary anatomy or pathology; that interpretation by cardiologist is attached.   Cardiovascular: No pericardial   effusion. The RV is nondilated. Dilated central pulmonary arteries   suggesting pulmonary hypertension. No discrete filling defects to   suggest acute PE. 3-vessel coronary calcifications. Aortic valve   leaflet coarse calcifications. Scattered calcified aortic plaque   through the thoracic aorta without aneurysm. Classic 3-vessel   brachiocephalic arterial origin anatomy without significant   stenosis. Bilateral carotid bifurcation calcified plaque without   high-grade stenosis.   Mediastinum/Nodes: No mass or adenopathy.   Lungs/Pleura: No pleural effusion. No pneumothorax. Mild dependent   atelectasis posteriorly in the lower lobes.   Musculoskeletal: Chronic mild T12 vertebral compression deformity.   Review of the MIP images confirms the above findings.   IMPRESSION: 1. No acute findings. 2. Dilated central pulmonary arteries suggesting pulmonary hypertension. 3. Coronary and aortic Atherosclerosis (ICD10-I70.0).     Electronically Signed   By: Corlis Leak M.D.   On: 01/06/2024 18:20    Addended by Oley Balm, MD on 01/06/2024  6:22 PM    Study Result   Narrative & Impression  CLINICAL DATA:  Severe Aortic Stenosis.   EXAM: Cardiac TAVR CT   TECHNIQUE: A non-contrast, gated CT scan was obtained with axial slices of 3 mm through the heart for aortic valve calcium scoring. A 120 kV retrospective, gated, contrast cardiac scan was obtained. Gantry rotation speed was 250 msecs and collimation was 0.6 mm. Nitroglycerin was not given. The 3D data set was reconstructed in 5% intervals of the 0-95% of the R-R cycle. Systolic and diastolic phases were analyzed on a dedicated workstation  using MPR, MIP, and VRT modes. The patient received 100 cc of contrast.   FINDINGS: Image quality: Excellent.   Noise artifact is: Limited.   Valve Morphology: Tricuspid aortic valve with diffuse severe calcifications. Bulky calcification of the NCC and RCC extending into the annulus and LVOT.   Aortic Valve Calcium score: 4785   Aortic annular dimension:   Phase assessed: 35%   Annular area: 485 mm2   Annular perimeter: 79.4 mm   Max diameter: 28.1 mm   Min diameter: 23.1 mm   Annular and subannular calcification: Moderate, single protruding calcification under the NCC. Single, layered calcium under the RCC.   Membranous septum length: 6.0 mm   Optimal coplanar projection: RAO 12 CAU 18   Coronary Artery Height above Annulus:   Left Main: 14.5 mm   Right Coronary: 19.4 mm   Sinus of Valsalva Measurements:   Non-coronary: 32 mm   Right-coronary: 32 mm   Left-coronary: 33 mm   Sinus of Valsalva Height:   Non-coronary: 24.8 mm   Right-coronary: 23.2 mm   Left-coronary: 22.7 mm   Sinotubular Junction: 29 mm   Ascending Thoracic Aorta: 33 mm   Coronary Arteries: Normal coronary origin. Right dominance. The study was performed without use of NTG and is insufficient for plaque evaluation. Please refer to recent cardiac catheterization for coronary assessment. Stent noted in RCA.   Cardiac Morphology:   Right Atrium: Right atrial size is within normal limits.   Right Ventricle: The right ventricular cavity is within normal limits.   Left Atrium: Left atrial size is normal in size with no left atrial appendage  filling defect.   Left Ventricle: The ventricular cavity size is within normal limits.   Pulmonary arteries: Dilated pulmonary artery suggestive of pulmonary hypertension.   Pulmonary veins: Normal pulmonary venous drainage.   Pericardium: Normal thickness with no significant effusion or calcium present.   Mitral Valve: The mitral  valve is normal structure without significant calcification.   Extra-cardiac findings: See attached radiology report for non-cardiac structures.   IMPRESSION: 1. Tricuspid aortic valve with diffuse severe calcifications. Calcium score 4785. Bulky calcification of the NCC and RCC extending into the annulus and LVOT.   2. Annular measurements support a 26 mm S3 or 29 mm Evolut Pro.   3. Moderate, single protruding calcification under the NCC. Single, layered calcium under the RCC.   4. Sufficient coronary to annulus distance.   5. Optimal Fluoroscopic Angle for Delivery: RAO 12 CAU 18   6. Dilated pulmonary artery suggestive of pulmonary hypertension.   Gerri Spore T. Flora Lipps, MD   Electronically Signed: By: Lennie Odor M.D. On: 01/02/2024 15:42        Narrative & Impression  CLINICAL DATA:  Preop planning, aortic valve replacement   EXAM: CT ANGIOGRAPHY CHEST, ABDOMEN AND PELVIS   TECHNIQUE: Multidetector CT imaging through the chest, abdomen and pelvis was performed using the standard protocol during bolus administration of intravenous contrast. Multiplanar reconstructed images and MIPs were obtained and reviewed to evaluate the vascular anatomy.   RADIATION DOSE REDUCTION: This exam was performed according to the departmental dose-optimization program which includes automated exposure control, adjustment of the mA and/or kV according to patient size and/or use of iterative reconstruction technique.   CONTRAST:  95mL OMNIPAQUE IOHEXOL 350 MG/ML SOLN   COMPARISON:  12/29/2013   FINDINGS: CTA CHEST FINDINGS   Cardiovascular: Heart size is upper limits normal. No pericardial effusion. The RV is nondilated. Dilated central pulmonary arteries suggesting pulmonary hypertension. No discrete filling defects to suggest acute PE. 3-vessel coronary calcifications. Aortic valve leaflet coarse calcifications. Scattered calcified aortic plaque through the thoracic aorta  without aneurysm. Classic 3-vessel brachiocephalic arterial origin anatomy without significant stenosis. Bilateral carotid bifurcation calcified plaque without high-grade stenosis.   Mediastinum/Nodes: No mass or adenopathy.   Lungs/Pleura: No pleural effusion. No pneumothorax. Mild dependent atelectasis posteriorly in the lower lobes.   Musculoskeletal: Chronic mild T12 vertebral compression deformity.   Review of the MIP images confirms the above findings.   CTA ABDOMEN AND PELVIS FINDINGS   VASCULAR   Aorta: Extensive calcified atheromatous plaque. No dissection or stenosis. Left lateral penetrating atheromatous ulcer in the infrarenal segment with associated 3 cm saccular aneurysm, containing eccentric mural thrombus. Aorta returns to normal caliber above the bifurcation.   Celiac: Left gastric artery arises directly from the arch, anatomic variant. Mild partially calcified nonocclusive plaque in the origin of the celiac axis, with patent splenic and common hepatic arteries.   SMA: Partially calcified ostial plaque without stenosis, patent distally with classic branch anatomy.   Renals: Single right, widely patent. Duplicated left, Co dominant, both patent.   IMA: Patent, arising from the inferior margin of the saccular aneurysm.   Inflow: Scattered calcified plaque without high-grade stenosis. Moderate tortuosity. No aneurysm or dissection. Visualized proximal outflow is patent bilaterally.   Veins: No obvious venous abnormality within the limitations of this arterial phase study.   Review of the MIP images confirms the above findings.   NON-VASCULAR   Hepatobiliary: No focal liver abnormality is seen. No gallstones, gallbladder wall thickening, or biliary dilatation.   Pancreas:  Unremarkable. No pancreatic ductal dilatation or surrounding inflammatory changes.   Spleen: Normal in size without focal abnormality.   Adrenals/Urinary Tract: Adrenal glands are  unremarkable. Kidneys are normal, without renal calculi, focal lesion, or hydronephrosis. Bladder is unremarkable.   Stomach/Bowel: The stomach is physiologically distended, without acute finding. Small-bowel decompressed. Normal appendix. The colon is partially distended, with scattered descending and numerous sigmoid diverticula; no adjacent inflammatory change.   Lymphatic: No abdominal or pelvic adenopathy.   Reproductive: Mild prostate enlargement.   Other: No ascites.  No free air.   Musculoskeletal: Stable degenerative disc disease L4-S1.   Review of the MIP images confirms the above findings.   IMPRESSION: 1. No acute findings. 2. 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 Recommend referral to or continued care with vascular specialist. (Ref.: J Vasc Surg. 2018; 67:2-77 and J Am Coll Radiol 2013;10(10):789-794.) 3. Coronary and aortic Atherosclerosis (ICD10-I70.0).     Electronically Signed   By: Corlis Leak M.D.   On: 01/06/2024 18:14        Impression:   This 83 year old gentleman has stage C, asymptomatic, critical aortic stenosis with a heavily calcified and thickened aortic valve with restricted leaflet mobility.  The mean gradient is 54.5 mmHg with a dimensionless index of 0.21 and a valve area of 0.66 cm.  Left ventricular systolic function is normal.  He did recently have an episode where he fell off an elliptical machine at the gym after stopping a workout and there was some suggestion that he may have gotten dizzy before falling.  He has NYHA class I symptoms. I agree that aortic valve replacement is indicated in this patient to prevent further left ventricular hypertrophy and progressive left ventricular dysfunction as well as to prevent development of worsening symptoms or sudden death.  Given his advanced age I think transcatheter aortic valve replacement would be the best option for treating him.  His  gated cardiac CTA shows anatomy suitable for TAVR using a 26 mm SAPIEN 3 valve.  His abdominal and pelvic CTA shows adequate pelvic vascular anatomy to allow transfemoral insertion.  His valve is very calcified and we could consider using a Sentinel device for embolic protection although he has a very tortuous innominate artery which may make placement difficult.   The patient and his wife were counseled at length regarding treatment alternatives for management of severe symptomatic aortic stenosis. The risks and benefits of surgical intervention has been discussed in detail. Long-term prognosis with medical therapy was discussed. Alternative approaches such as conventional surgical aortic valve replacement, transcatheter aortic valve replacement, and palliative medical therapy were compared and contrasted at length. This discussion was placed in the context of the patient's own specific clinical presentation and past medical history. All of their questions have been addressed.    Following the decision to proceed with transcatheter aortic valve replacement, a discussion was held regarding what types of management strategies would be attempted intraoperatively in the event of life-threatening complications, including whether or not the patient would be considered a candidate for the use of cardiopulmonary bypass and/or conversion to open sternotomy for attempted surgical intervention.  I think he would be a candidate for emergent sternotomy to manage any intraoperative complications.  The patient is aware of the fact that transient use of cardiopulmonary bypass may be necessary. The patient has been advised of a variety of complications that might develop including but not limited to risks of death, stroke, paravalvular leak, aortic dissection  or other major vascular complications, aortic annulus rupture, device embolization, cardiac rupture or perforation, mitral regurgitation, acute myocardial infarction,  arrhythmia, heart block or bradycardia requiring permanent pacemaker placement, congestive heart failure, respiratory failure, renal failure, pneumonia, infection, other late complications related to structural valve deterioration or migration, or other complications that might ultimately cause a temporary or permanent loss of functional independence or other long term morbidity. The patient provides full informed consent for the procedure as described and all questions were answered.       Plan:   Transfemoral TAVR using a SAPIEN 3 valve.      Alleen Borne, MD

## 2024-02-10 ENCOUNTER — Other Ambulatory Visit: Payer: Self-pay | Admitting: Physician Assistant

## 2024-02-10 ENCOUNTER — Inpatient Hospital Stay (HOSPITAL_COMMUNITY)

## 2024-02-10 ENCOUNTER — Encounter (HOSPITAL_COMMUNITY)
Admission: RE | Disposition: A | Discharge status: Home / Self Care | Payer: Self-pay | Source: Home / Self Care | Attending: Cardiovascular Disease

## 2024-02-10 ENCOUNTER — Inpatient Hospital Stay (HOSPITAL_COMMUNITY): Payer: Self-pay | Admitting: Physician Assistant

## 2024-02-10 ENCOUNTER — Inpatient Hospital Stay (HOSPITAL_COMMUNITY)
Admission: RE | Admit: 2024-02-10 | Discharge: 2024-02-11 | DRG: 267 | Disposition: A | Discharge status: Home / Self Care | Payer: PPO | Attending: Cardiovascular Disease | Admitting: Cardiovascular Disease

## 2024-02-10 ENCOUNTER — Other Ambulatory Visit: Payer: Self-pay

## 2024-02-10 ENCOUNTER — Encounter (HOSPITAL_COMMUNITY): Payer: Self-pay | Admitting: Cardiovascular Disease

## 2024-02-10 ENCOUNTER — Inpatient Hospital Stay (HOSPITAL_COMMUNITY): Admitting: Anesthesiology

## 2024-02-10 DIAGNOSIS — Z7902 Long term (current) use of antithrombotics/antiplatelets: Secondary | ICD-10-CM

## 2024-02-10 DIAGNOSIS — Z006 Encounter for examination for normal comparison and control in clinical research program: Secondary | ICD-10-CM

## 2024-02-10 DIAGNOSIS — I714 Abdominal aortic aneurysm, without rupture, unspecified: Secondary | ICD-10-CM | POA: Diagnosis present

## 2024-02-10 DIAGNOSIS — Z8781 Personal history of (healed) traumatic fracture: Secondary | ICD-10-CM

## 2024-02-10 DIAGNOSIS — Z860100 Personal history of colon polyps, unspecified: Secondary | ICD-10-CM | POA: Diagnosis not present

## 2024-02-10 DIAGNOSIS — I6523 Occlusion and stenosis of bilateral carotid arteries: Secondary | ICD-10-CM | POA: Diagnosis not present

## 2024-02-10 DIAGNOSIS — J9811 Atelectasis: Secondary | ICD-10-CM | POA: Diagnosis not present

## 2024-02-10 DIAGNOSIS — I5031 Acute diastolic (congestive) heart failure: Secondary | ICD-10-CM | POA: Diagnosis present

## 2024-02-10 DIAGNOSIS — I350 Nonrheumatic aortic (valve) stenosis: Principal | ICD-10-CM | POA: Diagnosis present

## 2024-02-10 DIAGNOSIS — I2510 Atherosclerotic heart disease of native coronary artery without angina pectoris: Secondary | ICD-10-CM

## 2024-02-10 DIAGNOSIS — Z952 Presence of prosthetic heart valve: Secondary | ICD-10-CM

## 2024-02-10 DIAGNOSIS — Z825 Family history of asthma and other chronic lower respiratory diseases: Secondary | ICD-10-CM | POA: Diagnosis not present

## 2024-02-10 DIAGNOSIS — Z7982 Long term (current) use of aspirin: Secondary | ICD-10-CM | POA: Diagnosis not present

## 2024-02-10 DIAGNOSIS — Z8249 Family history of ischemic heart disease and other diseases of the circulatory system: Secondary | ICD-10-CM

## 2024-02-10 DIAGNOSIS — Z83438 Family history of other disorder of lipoprotein metabolism and other lipidemia: Secondary | ICD-10-CM | POA: Diagnosis not present

## 2024-02-10 DIAGNOSIS — E785 Hyperlipidemia, unspecified: Secondary | ICD-10-CM

## 2024-02-10 DIAGNOSIS — Z8551 Personal history of malignant neoplasm of bladder: Secondary | ICD-10-CM

## 2024-02-10 DIAGNOSIS — C679 Malignant neoplasm of bladder, unspecified: Secondary | ICD-10-CM | POA: Diagnosis present

## 2024-02-10 DIAGNOSIS — Z87891 Personal history of nicotine dependence: Secondary | ICD-10-CM | POA: Diagnosis not present

## 2024-02-10 DIAGNOSIS — Z955 Presence of coronary angioplasty implant and graft: Secondary | ICD-10-CM

## 2024-02-10 DIAGNOSIS — Z8673 Personal history of transient ischemic attack (TIA), and cerebral infarction without residual deficits: Secondary | ICD-10-CM | POA: Diagnosis not present

## 2024-02-10 DIAGNOSIS — Z01818 Encounter for other preprocedural examination: Secondary | ICD-10-CM

## 2024-02-10 DIAGNOSIS — Z79899 Other long term (current) drug therapy: Secondary | ICD-10-CM

## 2024-02-10 DIAGNOSIS — Z91041 Radiographic dye allergy status: Secondary | ICD-10-CM | POA: Diagnosis not present

## 2024-02-10 DIAGNOSIS — I739 Peripheral vascular disease, unspecified: Secondary | ICD-10-CM | POA: Diagnosis not present

## 2024-02-10 DIAGNOSIS — E039 Hypothyroidism, unspecified: Secondary | ICD-10-CM

## 2024-02-10 DIAGNOSIS — N400 Enlarged prostate without lower urinary tract symptoms: Secondary | ICD-10-CM | POA: Diagnosis present

## 2024-02-10 DIAGNOSIS — Z8669 Personal history of other diseases of the nervous system and sense organs: Secondary | ICD-10-CM

## 2024-02-10 HISTORY — DX: Presence of prosthetic heart valve: Z95.2

## 2024-02-10 HISTORY — PX: INTRAOPERATIVE TRANSTHORACIC ECHOCARDIOGRAM: SHX6523

## 2024-02-10 LAB — ECHOCARDIOGRAM LIMITED
AR max vel: 1.62 cm2
AV Area VTI: 1.58 cm2
AV Area mean vel: 1.63 cm2
AV Mean grad: 7 mmHg
AV Peak grad: 13.2 mmHg
Ao pk vel: 1.82 m/s
Area-P 1/2: 2.66 cm2
P 1/2 time: 384 ms
S' Lateral: 2.9 cm

## 2024-02-10 LAB — POCT I-STAT, CHEM 8
BUN: 12 mg/dL (ref 8–23)
BUN: 13 mg/dL (ref 8–23)
Calcium, Ion: 1.16 mmol/L (ref 1.15–1.40)
Calcium, Ion: 1.19 mmol/L (ref 1.15–1.40)
Chloride: 106 mmol/L (ref 98–111)
Chloride: 106 mmol/L (ref 98–111)
Creatinine, Ser: 0.9 mg/dL (ref 0.61–1.24)
Creatinine, Ser: 0.9 mg/dL (ref 0.61–1.24)
Glucose, Bld: 150 mg/dL — ABNORMAL HIGH (ref 70–99)
Glucose, Bld: 148 mg/dL — ABNORMAL HIGH (ref 70–99)
HCT: 32 % — ABNORMAL LOW (ref 39.0–52.0)
HCT: 33 % — ABNORMAL LOW (ref 39.0–52.0)
Hemoglobin: 10.9 g/dL — ABNORMAL LOW (ref 13.0–17.0)
Hemoglobin: 11.2 g/dL — ABNORMAL LOW (ref 13.0–17.0)
Potassium: 3.7 mmol/L (ref 3.5–5.1)
Potassium: 3.8 mmol/L (ref 3.5–5.1)
Sodium: 140 mmol/L (ref 135–145)
Sodium: 140 mmol/L (ref 135–145)
TCO2: 22 mmol/L (ref 22–32)
TCO2: 22 mmol/L (ref 22–32)

## 2024-02-10 LAB — ABO/RH: ABO/RH(D): O POS

## 2024-02-10 LAB — PROTIME-INR
INR: 1.1 (ref 0.8–1.2)
Prothrombin Time: 14.5 s (ref 11.4–15.2)

## 2024-02-10 SURGERY — TRANSCATHETER AORTIC VALVE REPLACEMENT, TRANSFEMORAL (CATHLAB)
Anesthesia: Monitor Anesthesia Care

## 2024-02-10 MED ORDER — SODIUM CHLORIDE 0.9% FLUSH: 3.0000 mL | INTRAVENOUS | Status: DC | PRN

## 2024-02-10 MED ORDER — ONDANSETRON HCL 4 MG/2ML IJ SOLN: 4.0000 mg | Freq: Four times a day (QID) | INTRAMUSCULAR | Status: DC | PRN

## 2024-02-10 MED ORDER — SODIUM CHLORIDE 0.9 % IV SOLN: INTRAVENOUS | Status: DC

## 2024-02-10 MED ORDER — PREDNISONE 20 MG PO TABS
50.0000 mg | ORAL_TABLET | Freq: Once | ORAL | Status: AC
  Administered 2024-02-10: 50 mg via ORAL
  Filled 2024-02-10: qty 3

## 2024-02-10 MED ORDER — SODIUM CHLORIDE 0.9 % IV SOLN
INTRAVENOUS | Status: AC
  Administered 2024-02-10: 50 mL/h via INTRAVENOUS

## 2024-02-10 MED ORDER — LEVOTHYROXINE SODIUM 137 MCG PO TABS
137.0000 ug | ORAL_TABLET | Freq: Every day | ORAL | Status: DC
Start: 2024-02-11 — End: 2024-02-11
  Administered 2024-02-11: 137 ug via ORAL
  Filled 2024-02-10: qty 1

## 2024-02-10 MED ORDER — CEFAZOLIN SODIUM-DEXTROSE 2-4 GM/100ML-% IV SOLN
2.0000 g | Freq: Three times a day (TID) | INTRAVENOUS | Status: AC
  Administered 2024-02-10 – 2024-02-11 (×2): 2 g via INTRAVENOUS
  Filled 2024-02-10 (×2): qty 100

## 2024-02-10 MED ORDER — HEPARIN (PORCINE) IN NACL 1000-0.9 UT/500ML-% IV SOLN
INTRAVENOUS | Status: DC | PRN
  Administered 2024-02-10: 1500 mL

## 2024-02-10 MED ORDER — IODIXANOL 320 MG/ML IV SOLN
INTRAVENOUS | Status: DC | PRN
  Administered 2024-02-10: 65 mL via INTRA_ARTERIAL

## 2024-02-10 MED ORDER — CHLORHEXIDINE GLUCONATE 4 % EX SOLN: 60.0000 mL | Freq: Once | CUTANEOUS | Status: DC

## 2024-02-10 MED ORDER — MAGNESIUM OXIDE -MG SUPPLEMENT 400 (240 MG) MG PO TABS
400.0000 mg | ORAL_TABLET | Freq: Every day | ORAL | Status: DC
Start: 2024-02-10 — End: 2024-02-11
  Administered 2024-02-10 – 2024-02-11 (×2): 400 mg via ORAL
  Filled 2024-02-10 (×2): qty 1

## 2024-02-10 MED ORDER — CHLORHEXIDINE GLUCONATE 4 % EX SOLN
60.0000 mL | Freq: Once | CUTANEOUS | Status: DC
  Filled 2024-02-10: qty 60

## 2024-02-10 MED ORDER — CLEVIDIPINE BUTYRATE 0.5 MG/ML IV EMUL
INTRAVENOUS | Status: AC
  Filled 2024-02-10: qty 50

## 2024-02-10 MED ORDER — FERROUS SULFATE 325 (65 FE) MG PO TABS
325.0000 mg | ORAL_TABLET | Freq: Every day | ORAL | Status: DC
Start: 2024-02-10 — End: 2024-02-11
  Administered 2024-02-10 – 2024-02-11 (×2): 325 mg via ORAL
  Filled 2024-02-10 (×2): qty 1

## 2024-02-10 MED ORDER — DIPHENHYDRAMINE HCL 25 MG PO CAPS
50.0000 mg | ORAL_CAPSULE | ORAL | Status: AC
  Administered 2024-02-10: 50 mg via ORAL
  Filled 2024-02-10: qty 2

## 2024-02-10 MED ORDER — PHENYLEPHRINE 80 MCG/ML (10ML) SYRINGE FOR IV PUSH (FOR BLOOD PRESSURE SUPPORT)
PREFILLED_SYRINGE | INTRAVENOUS | Status: DC | PRN
  Administered 2024-02-10: 80 ug via INTRAVENOUS

## 2024-02-10 MED ORDER — LIDOCAINE HCL (PF) 1 % IJ SOLN
INTRAMUSCULAR | Status: DC | PRN
  Administered 2024-02-10: 22 mL

## 2024-02-10 MED ORDER — CLOPIDOGREL BISULFATE 75 MG PO TABS
75.0000 mg | ORAL_TABLET | Freq: Every day | ORAL | Status: DC
  Administered 2024-02-10 – 2024-02-11 (×2): 75 mg via ORAL
  Filled 2024-02-10 (×2): qty 1

## 2024-02-10 MED ORDER — SODIUM CHLORIDE 0.9 % IV SOLN: 250.0000 mL | INTRAVENOUS | Status: DC | PRN

## 2024-02-10 MED ORDER — SODIUM CHLORIDE 0.9% FLUSH
3.0000 mL | Freq: Two times a day (BID) | INTRAVENOUS | Status: DC
  Administered 2024-02-10 – 2024-02-11 (×2): 3 mL via INTRAVENOUS

## 2024-02-10 MED ORDER — CHLORHEXIDINE GLUCONATE 4 % EX SOLN
30.0000 mL | CUTANEOUS | Status: DC
  Filled 2024-02-10: qty 30

## 2024-02-10 MED ORDER — TRAMADOL HCL 50 MG PO TABS: 50.0000 mg | ORAL_TABLET | ORAL | Status: DC | PRN

## 2024-02-10 MED ORDER — FUROSEMIDE 10 MG/ML IJ SOLN
40.0000 mg | Freq: Once | INTRAMUSCULAR | Status: AC
  Administered 2024-02-10: 40 mg via INTRAVENOUS
  Filled 2024-02-10: qty 4

## 2024-02-10 MED ORDER — LACTATED RINGERS IV SOLN: INTRAVENOUS | Status: DC | PRN

## 2024-02-10 MED ORDER — CHLORHEXIDINE GLUCONATE 0.12 % MT SOLN
15.0000 mL | Freq: Once | OROMUCOSAL | Status: AC
  Administered 2024-02-10: 15 mL via OROMUCOSAL
  Filled 2024-02-10: qty 15

## 2024-02-10 MED ORDER — PROTAMINE SULFATE 10 MG/ML IV SOLN
INTRAVENOUS | Status: DC | PRN
  Administered 2024-02-10: 100 mg via INTRAVENOUS

## 2024-02-10 MED ORDER — PROPOFOL 500 MG/50ML IV EMUL
INTRAVENOUS | Status: DC | PRN
  Administered 2024-02-10: 10 mg via INTRAVENOUS

## 2024-02-10 MED ORDER — LORATADINE 10 MG PO TABS
10.0000 mg | ORAL_TABLET | Freq: Every day | ORAL | Status: DC
  Administered 2024-02-10 – 2024-02-11 (×2): 10 mg via ORAL
  Filled 2024-02-10 (×2): qty 1

## 2024-02-10 MED ORDER — ACETAMINOPHEN 650 MG RE SUPP: 650.0000 mg | Freq: Four times a day (QID) | RECTAL | Status: DC | PRN

## 2024-02-10 MED ORDER — MORPHINE SULFATE (PF) 2 MG/ML IV SOLN: 1.0000 mg | INTRAVENOUS | Status: DC | PRN

## 2024-02-10 MED ORDER — AMOXICILLIN-POT CLAVULANATE 875-125 MG PO TABS
1.0000 | ORAL_TABLET | Freq: Two times a day (BID) | ORAL | Status: DC
  Administered 2024-02-10 – 2024-02-11 (×3): 1 via ORAL
  Filled 2024-02-10 (×3): qty 1

## 2024-02-10 MED ORDER — NITROGLYCERIN IN D5W 200-5 MCG/ML-% IV SOLN: 0.0000 ug/min | INTRAVENOUS | Status: DC

## 2024-02-10 MED ORDER — VERAPAMIL HCL 2.5 MG/ML IV SOLN
INTRAVENOUS | Status: AC
  Filled 2024-02-10: qty 2

## 2024-02-10 MED ORDER — CLEVIDIPINE BUTYRATE 0.5 MG/ML IV EMUL
INTRAVENOUS | Status: DC | PRN
  Administered 2024-02-10: 2 mg/h via INTRAVENOUS

## 2024-02-10 MED ORDER — ACETAMINOPHEN 325 MG PO TABS: 650.0000 mg | ORAL_TABLET | Freq: Four times a day (QID) | ORAL | Status: DC | PRN

## 2024-02-10 MED ORDER — ASPIRIN 81 MG PO TBEC
81.0000 mg | DELAYED_RELEASE_TABLET | Freq: Every day | ORAL | Status: DC
  Administered 2024-02-10 – 2024-02-11 (×2): 81 mg via ORAL
  Filled 2024-02-10 (×2): qty 1

## 2024-02-10 MED ORDER — ATORVASTATIN CALCIUM 80 MG PO TABS
80.0000 mg | ORAL_TABLET | Freq: Every day | ORAL | Status: DC
  Administered 2024-02-10 – 2024-02-11 (×2): 80 mg via ORAL
  Filled 2024-02-10 (×2): qty 1

## 2024-02-10 MED ORDER — OXYCODONE HCL 5 MG PO TABS
5.0000 mg | ORAL_TABLET | ORAL | Status: DC | PRN
Start: 2024-02-10 — End: 2024-02-11

## 2024-02-10 MED ORDER — DORZOLAMIDE HCL-TIMOLOL MAL 2-0.5 % OP SOLN
1.0000 [drp] | Freq: Two times a day (BID) | OPHTHALMIC | Status: DC
  Administered 2024-02-10 – 2024-02-11 (×2): 1 [drp] via OPHTHALMIC
  Filled 2024-02-10: qty 10

## 2024-02-10 MED ORDER — CEFAZOLIN SODIUM-DEXTROSE 2-4 GM/100ML-% IV SOLN
2.0000 g | Freq: Three times a day (TID) | INTRAVENOUS | Status: DC
  Filled 2024-02-10: qty 100

## 2024-02-10 MED ORDER — LIDOCAINE HCL (PF) 1 % IJ SOLN
INTRAMUSCULAR | Status: AC
  Filled 2024-02-10: qty 30

## 2024-02-10 MED ORDER — LABETALOL HCL 5 MG/ML IV SOLN
INTRAVENOUS | Status: DC | PRN
  Administered 2024-02-10: 5 mg via INTRAVENOUS

## 2024-02-10 MED ORDER — HEPARIN SODIUM (PORCINE) 1000 UNIT/ML IJ SOLN
INTRAMUSCULAR | Status: DC | PRN
  Administered 2024-02-10: 12000 [IU] via INTRAVENOUS

## 2024-02-10 MED ORDER — VERAPAMIL HCL 2.5 MG/ML IV SOLN
INTRAVENOUS | Status: DC | PRN
  Administered 2024-02-10: 10 mL via INTRA_ARTERIAL

## 2024-02-10 MED ORDER — POTASSIUM CHLORIDE CRYS ER 20 MEQ PO TBCR
20.0000 meq | EXTENDED_RELEASE_TABLET | Freq: Once | ORAL | Status: AC
  Administered 2024-02-10: 20 meq via ORAL
  Filled 2024-02-10: qty 1

## 2024-02-10 SURGICAL SUPPLY — 36 items
BAG SNAP BAND KOVER 36X36 (MISCELLANEOUS) ×4 IMPLANT
CABLE ADAPT PACING TEMP 12FT (ADAPTER) IMPLANT
CATH 26 ULTRA DELIVERY (CATHETERS) IMPLANT
CATH DIAG 6FR PIGTAIL ANGLED (CATHETERS) IMPLANT
CATH INFINITI 5FR ANG PIGTAIL (CATHETERS) IMPLANT
CATH INFINITI 6F AL2 (CATHETERS) IMPLANT
CATH INFINITI JR4 5F (CATHETERS) IMPLANT
CATH S G BIP PACING (CATHETERS) IMPLANT
CLOSURE MYNX CONTROL 6F/7F (Vascular Products) IMPLANT
CLOSURE PERCLOSE PROSTYLE (VASCULAR PRODUCTS) IMPLANT
CRIMPER (MISCELLANEOUS) IMPLANT
DEVICE INFLATION ATRION QL2530 (MISCELLANEOUS) IMPLANT
DEVICE RAD COMP TR BAND LRG (VASCULAR PRODUCTS) IMPLANT
ELECT DEFIB PAD ADLT CADENCE (PAD) IMPLANT
FILTER CEREBRAL PROT SENTINEL (FILTER) IMPLANT
GLIDESHEATH SLEND SS 6F .021 (SHEATH) IMPLANT
GUIDEWIRE ANGLED .035X150CM (WIRE) IMPLANT
KIT MICROPUNCTURE NIT STIFF (SHEATH) IMPLANT
KIT SAPIAN 3 ULTRA RESILIA 26 (Valve) IMPLANT
PACK CARDIAC CATHETERIZATION (CUSTOM PROCEDURE TRAY) ×2 IMPLANT
SET ATX-X65L (MISCELLANEOUS) IMPLANT
SHEATH BRITE TIP 6FR 35CM (SHEATH) IMPLANT
SHEATH CATAPULT 7FR 45 (SHEATH) IMPLANT
SHEATH INTRODUCER SET 20-26 (SHEATH) IMPLANT
SHEATH PINNACLE 6F 10CM (SHEATH) IMPLANT
SHEATH PINNACLE 8F 10CM (SHEATH) IMPLANT
SHEATH PROBE COVER 6X72 (BAG) IMPLANT
STOPCOCK MORSE 400PSI 3WAY (MISCELLANEOUS) ×4 IMPLANT
TRANSDUCER W/STOPCOCK (MISCELLANEOUS) IMPLANT
TUBING ART PRESS 72 MALE/FEM (TUBING) IMPLANT
WIRE AMPLATZ SS-J .035X180CM (WIRE) IMPLANT
WIRE CHOICE GRAPHX PT 300 (WIRE) IMPLANT
WIRE EMERALD 3MM-J .035X150CM (WIRE) IMPLANT
WIRE EMERALD 3MM-J .035X260CM (WIRE) IMPLANT
WIRE EMERALD ST .035X260CM (WIRE) IMPLANT
WIRE SAFARI SM CURVE 275 (WIRE) IMPLANT

## 2024-02-10 NOTE — Plan of Care (Signed)

## 2024-02-10 NOTE — Anesthesia Preprocedure Evaluation (Signed)
 Anesthesia Evaluation  Patient identified by MRN, date of birth, ID band Patient awake    Reviewed: Allergy & Precautions, H&P , NPO status , Patient's Chart, lab work & pertinent test results  Airway Mallampati: II   Neck ROM: full    Dental   Pulmonary former smoker   breath sounds clear to auscultation       Cardiovascular + CAD, + Cardiac Stents and + Peripheral Vascular Disease  + Valvular Problems/Murmurs AS  Rhythm:regular Rate:Normal  TTE (11/25/2023): EF 70%, severe AS with mean PG 53 mmHg. No evidence of MR.   Neuro/Psych    GI/Hepatic   Endo/Other    Renal/GU      Musculoskeletal   Abdominal   Peds  Hematology   Anesthesia Other Findings   Reproductive/Obstetrics                             Anesthesia Physical Anesthesia Plan  ASA: 3  Anesthesia Plan: MAC   Post-op Pain Management:    Induction: Intravenous  PONV Risk Score and Plan: 1 and Ondansetron, Dexamethasone and Treatment may vary due to age or medical condition  Airway Management Planned:   Additional Equipment: Arterial line  Intra-op Plan:   Post-operative Plan:   Informed Consent: I have reviewed the patients History and Physical, chart, labs and discussed the procedure including the risks, benefits and alternatives for the proposed anesthesia with the patient or authorized representative who has indicated his/her understanding and acceptance.     Dental advisory given  Plan Discussed with: CRNA, Anesthesiologist and Surgeon  Anesthesia Plan Comments:        Anesthesia Quick Evaluation

## 2024-02-10 NOTE — Progress Notes (Signed)
  [facility]   MULTIDISCIPLINARY HEART VALVE TEAM  Patient doing well s/p TAVR. He is hemodynamically stable. Groin sites stable (right with very mild ooze). ECG with sinus and no high grade block. Transferred from cath lab holding to 4E. LVEDP 30 mm hg at the time of TAVR. Will treat with one does of IV lasix 40mg  and Kdur . Early ambulation after bedrest completed and hopeful discharge over the next 24-48 hours.   Cline Crock PA-C  [facility]  Pager [phone]

## 2024-02-10 NOTE — Interval H&P Note (Signed)
 History and Physical Interval Note:  02/10/2024 10:07 AM  Anthony Noble.  has presented today for surgery, with the diagnosis of Severe Aortic Stenosis.  The various methods of treatment have been discussed with the patient and family. After consideration of risks, benefits and other options for treatment, the patient has consented to  Procedure(s): Transcatheter Aortic Valve Replacement, Transfemoral Approach with Cerebral Embolic Protection (N/A) ECHOCARDIOGRAM, TRANSTHORACIC (N/A) as a surgical intervention.  The patient's history has been reviewed, patient examined, no change in status, stable for surgery.  I have reviewed the patient's chart and labs.  Questions were answered to the patient's satisfaction.     Alleen Borne

## 2024-02-10 NOTE — Anesthesia Postprocedure Evaluation (Signed)
 Anesthesia Post Note  Patient: Anthony Noble [facility].  Procedure(s) Performed: Transcatheter Aortic Valve Replacement, Transfemoral Approach with Cerebral Embolic Protection ECHOCARDIOGRAM, TRANSTHORACIC     Patient location during evaluation: Cath Lab Anesthesia Type: MAC Level of consciousness: awake and alert Pain management: pain level controlled Vital Signs Assessment: post-procedure vital signs reviewed and stable Respiratory status: spontaneous breathing, nonlabored ventilation, respiratory function stable and patient connected to nasal cannula oxygen Cardiovascular status: stable and blood pressure returned to baseline Postop Assessment: no apparent nausea or vomiting Anesthetic complications: no   There were no known notable events for this encounter.  Last Vitals:  Vitals:   02/10/24 1330 02/10/24 1335  BP: (!) 109/53 (!) 111/54  Pulse: 67 66  Resp: 16 15  Temp:    SpO2: 93% 92%    Last Pain:  Vitals:   02/10/24 1256  TempSrc:   PainSc: 0-No pain                 Tayvia Faughnan S

## 2024-02-10 NOTE — CV Procedure (Signed)
 HEART AND VASCULAR CENTER  TAVR OPERATIVE NOTE   Date of Procedure:  02/10/2024  Preoperative Diagnosis: Severe Aortic Stenosis   Postoperative Diagnosis: Same   Procedure:   Transcatheter Aortic Valve Replacement - Transfemoral Approach  Edwards Sapien 3 THV (size 26 mm, model # [id number], serial # [id number])       Sentinel Cerebral Protection device.    Co-Surgeons:  Verne Carrow, MD and Evelene Croon , MD   Anesthesiologist:  Hodierne  Echocardiographer:  O'Neal  Pre-operative Echo Findings: Severe aortic stenosis Normal left ventricular systolic function  Post-operative Echo Findings: No paravalvular leak Normal left ventricular systolic function  BRIEF CLINICAL NOTE AND INDICATIONS FOR SURGERY  83 yo male with history of anemia, carotid artery disease, hyperlipidemia, hypothyroidism, bladder cancer and severe aortic stenosis. Echo June 2024 with LVEF=60-65%. Mild AI. Severe aortic stenosis with mean gradient 53 mmHg, AVA 0.7 cm2, DI 0.22. Cardiac cath with severe proximal RCA stenosis treated with a drug eluting stent. CTA with finding of  infrarenal abdominal aortic aneurysm with associated penetrating ulcer. Cleared for procedure by Vascular surgery.   During the course of the patient's preoperative work up they have been evaluated comprehensively by a multidisciplinary team of specialists coordinated through the Multidisciplinary Heart Valve Clinic in the [facility].  They have been demonstrated to suffer from symptomatic severe aortic stenosis as noted above. The patient has been counseled extensively as to the relative risks and benefits of all options for the treatment of severe aortic stenosis including long term medical therapy, conventional surgery for aortic valve replacement, and transcatheter aortic valve replacement.  The patient has been independently evaluated by Dr. Laneta Simmers with CT surgery and they are felt to be at high risk for  conventional surgical aortic valve replacement. The surgeon indicated the patient would be a poor candidate for conventional surgery. Based upon review of all of the patient's preoperative diagnostic tests they are felt to be candidate for transcatheter aortic valve replacement using the transfemoral approach as an alternative to high risk conventional surgery.    Following the decision to proceed with transcatheter aortic valve replacement, a discussion has been held regarding what types of management strategies would be attempted intraoperatively in the event of life-threatening complications, including whether or not the patient would be considered a candidate for the use of cardiopulmonary bypass and/or conversion to open sternotomy for attempted surgical intervention.  The patient has been advised of a variety of complications that might develop peculiar to this approach including but not limited to risks of death, stroke, paravalvular leak, aortic dissection or other major vascular complications, aortic annulus rupture, device embolization, cardiac rupture or perforation, acute myocardial infarction, arrhythmia, heart block or bradycardia requiring permanent pacemaker placement, congestive heart failure, respiratory failure, renal failure, pneumonia, infection, other late complications related to structural valve deterioration or migration, or other complications that might ultimately cause a temporary or permanent loss of functional independence or other long term morbidity.  The patient provides full informed consent for the procedure as described and all questions were answered preoperatively.    DETAILS OF THE OPERATIVE PROCEDURE  PREPARATION:   The patient is brought to the operating room on the above mentioned date and central monitoring was established by the anesthesia team including placement of a radial arterial line. The patient is placed in the supine position on the operating table.   Intravenous antibiotics are administered. Conscious sedation is used.   Baseline transthoracic echocardiogram was performed. The patient's  chest, abdomen, both groins, and both lower extremities are prepared and draped in a sterile manner. A time out procedure is performed.   PERIPHERAL ACCESS:   Using the modified Seldinger technique, femoral arterial and venous access were obtained with placement of a 6 Fr sheath in the artery and a 7 Fr sheath in the vein on the right side using u/s guidance.  A pigtail diagnostic catheter was passed through the femoral arterial sheath under fluoroscopic guidance into the aortic root.  A temporary transvenous pacemaker catheter was passed through the femoral venous sheath under fluoroscopic guidance into the right ventricle.  The pacemaker was tested to ensure stable lead placement and pacemaker capture. Aortic root angiography was performed in order to determine the optimal angiographic angle for valve deployment.  The right wrist was prepped and draped. 1% lidocaine used for local anesthesia. A 6 French Slender sheath was placed in the right radial artery.   TRANSFEMORAL ACCESS:  A micropuncture kit was used to gain access to the left femoral artery using u/s guidance. Position confirmed with angiography. Pre-closure with double ProGlide closure devices. The patient was heparinized systemically and ACT verified > 250 seconds.    A 14 Fr transfemoral E-sheath was introduced into the left femoral artery after progressively dilating over an Amplatz superstiff wire.   A 0.14 PT Graphx wire was advanced into the ascending aorta. The Sentinel protection device was then advanced over the wire and the baskets were deployed without difficulty in the right innominate artery and left carotid artery.   An AL-2 catheter was used to direct a straight-tip exchange length wire across the native aortic valve into the left ventricle. This was exchanged out for a pigtail catheter  and position was confirmed in the LV apex. Simultaneous LV and Ao pressures were recorded.  The pigtail catheter was then exchanged for a Safari wire in the LV apex.   TRANSCATHETER HEART VALVE DEPLOYMENT:  An Edwards Sapien 3 THV (size 26 mm) was prepared and crimped per manufacturer's guidelines, and the proper orientation of the valve is confirmed on the [employer] delivery system. The valve was advanced through the introducer sheath using normal technique until in an appropriate position in the abdominal aorta beyond the sheath tip. The balloon was then retracted and using the fine-tuning wheel was centered on the valve. The valve was then advanced across the aortic arch using appropriate flexion of the catheter. The valve was carefully positioned across the aortic valve annulus. The Commander catheter was retracted using normal technique. Once final position of the valve has been confirmed by angiographic assessment, the valve is deployed while temporarily holding ventilation and during rapid ventricular pacing to maintain systolic blood pressure < 50 mmHg and pulse pressure < 10 mmHg. The balloon inflation is held for >3 seconds after reaching full deployment volume. Once the balloon has fully deflated the balloon is retracted into the ascending aorta and valve function is assessed using TTE. There is felt to be no paravalvular leak and no central aortic insufficiency.  The patient's hemodynamic recovery following valve deployment is good.  The deployment balloon and guidewire are both removed. Echo demostrated acceptable post-procedural gradients, stable mitral valve function, and no AI.   PROCEDURE COMPLETION:  The Sentinel cerebral protection device was then removed. The sheath was then removed and closure devices were completed. Protamine was administered once femoral arterial repair was complete. The temporary pacemaker, pigtail catheters and femoral sheaths were removed with a Mynx closure  device placed  in the artery and manual pressure used for venous hemostasis.    The patient tolerated the procedure well and is transported to the surgical intensive care in stable condition. There were no immediate intraoperative complications. All sponge instrument and needle counts are verified correct at completion of the operation.   No blood products were administered during the operation.  The patient received a total of 65 mL of intravenous contrast during the procedure.  LVEDP: 30 mm Hg  Verne Carrow MD, [facility] 02/10/2024 12:43 PM

## 2024-02-10 NOTE — Progress Notes (Signed)
  Echocardiogram 2D Echocardiogram has been performed.  Delcie Roch 02/10/2024, 12:21 PM

## 2024-02-10 NOTE — Discharge Instructions (Signed)

## 2024-02-10 NOTE — Discharge Summary (Incomplete)
 HEART AND VASCULAR CENTER   MULTIDISCIPLINARY HEART VALVE TEAM  Discharge Summary    Patient ID: Cordie Buening [facility]. MRN: [id number]; DOB: [birth year]  Admit date: 02/10/2024 Discharge date: 02/11/2024  Primary Care Provider: Dettinger, Elige Radon, MD  Primary Cardiologist: Reatha Harps, MD / Dr. Clifton James & Dr. Laneta Simmers (TAVR)  Discharge Diagnoses    Principal Problem:   S/P TAVR (transcatheter aortic valve replacement) Active Problems:   Hypothyroidism   Malignant neoplasm of urinary bladder (HCC)   History of Guillain-Barre syndrome   BPH (benign prostatic hyperplasia)   Hyperlipidemia   Severe aortic stenosis   CAD S/P percutaneous coronary angioplasty   Bilateral carotid artery stenosis without cerebral infarction   AAA (abdominal aortic aneurysm) without rupture (HCC)   Allergies Allergies  Allergen Reactions   Bcg Live Other (See Comments)    Treatment pt had- shakes   Septra [Bactrim] Other (See Comments)    Muscle weakness, numbness   Sulfa Antibiotics Other (See Comments)    Severe weakness   Pertussis Vaccines     History of Gillian barrie    Iodinated Contrast Media Rash    Diagnostic Studies/Procedures    HEART AND VASCULAR CENTER  TAVR OPERATIVE NOTE     Date of Procedure:                02/10/2024   Preoperative Diagnosis:      Severe Aortic Stenosis    Postoperative Diagnosis:    Same    Procedure:        Transcatheter Aortic Valve Replacement - Transfemoral Approach             Edwards Sapien 3 THV (size 26 mm, model # [id number], serial # [id number])       Sentinel Cerebral Protection device.               Co-Surgeons:                        Verne Carrow, MD and Evelene Croon , MD    Anesthesiologist:                  Hodierne   Echocardiographer:              O'Neal   Pre-operative Echo Findings: Severe aortic stenosis Normal left ventricular systolic function   Post-operative Echo Findings: No paravalvular leak Normal left  ventricular systolic function _____________    Echo 02/11/24: completed but pending formal read at the time of discharge   History of Present Illness     [facility] Angie Hogg. is a 83 y.o. male with a history of bladder cancer, remote pleural effusion s/p remote VATs (2001) s/b Guillain Barre, HLD, carotid artery disease, CAD s/p PCI/DES to pRCA (12/25/23), AAA and severe aortic stenosis who presented to [facility] on 02/10/24 for planned TAVR.   He has been followed by our team for severe asymptomatic stage C AS. Repeat echo 11/25/23 showed EF 70%, moderate LVH and severe AS with a mean gradient of 54.5 mmhg, aortic Vmax 4.56m/s, AVA 0.66cm2, DVI 0.2 and mild AI. Despite continued lack of symptoms it was felt prudent to start the TAVR work up to avoid heart failure and other adverse outcomes. [facility] 12/25/23 revealed severe stenosis in the proximal RCA (co-dominant vessel) s/p successful PTCA/DES x 1 proximal RCA. There was mild non-obstructive disease in the distal left main, proximal LAD and mid Circumflex and normal right heart pressures. Started on  DAPT with aspirin and plavix x 6 months. CTA of the aorta and iliac vessels on 01/06/24 showed a 3cm infrarenal saccular aneurysm with associated penetrating atheromatous ulcer. He was referred to [facility] and seen by Dr. Chestine Spore who felt it was safe to proceed with TF access during TAVR, but his AAA would likely need future repair given saccular morphology.   The patient was evaluated by the multidisciplinary valve team and felt to have severe, symptomatic aortic stenosis and to be a suitable candidate for TAVR, which was set up for 02/10/24. Of note, he was premedicated with benadryl and prednisone due to a contrast dye allergy.   Hospital Course     Consultants: none   Severe AS:  -- S/p successful TAVR with a 26 mm Edwards Sapien 3 Ultra Resilia  THV via the TF approach using Sentinel cerebral embolic protection on 02/10/24.  -- Post operative echo completed but  pending formal read. -- Groin/radial sites are stable.  -- ECG with sinus and no high grade heart block. -- Continued on DAPT with Aspirin 81 mg daily and Plavix 75mg  daily. -- Met with cardiac rehab to discuss CRP phase II.  -- Plan for discharge home today with close follow up in the outpatient setting.   Acute HFpEF: -- LVEDP 30 mm hg at the time of TAVR. -- Treated with one does of IV lasix 40mg  and Kdur . -- Not on standing diuretics at home.   CAD: -- S/p PCI/DES to pRCA (12/25/23).  -- Plan DAPT with Aspirin 81 mg daily and Plavix 75mg  daily x 6 month followed by aspirin 81mg  alone.  -- Continue Atorvastatin 80 mg daily.   AAA: -- Pre TAVR CT showed a 3 cm infrarenal abdominal aortic saccular aneurysm with associated penetrating atheromatous ulcer. -- Seen by Dr. Chestine Spore with [facility] who felt he would require elective AAA repair given the saccular morphology.   -- Follow up apt with Dr. Chestine Spore 03/09/24.  Carotid artery disease: -- Carotid duplex in 09/2023 showed 40-59% RICA stenosis and 1-39% LICA stenosis. There was >50% L/R ECA stenosis. -- Followed by Dr. Chestine Spore.   Bladder cancer: -- Followed by Dr. Annabell Howells   Sinusitis:  -- Recently started on Augmentin x 14 days.  -- Continued on abx during admission.  _____________  Discharge Vitals Blood pressure (!) 125/52, pulse (!) 56, temperature 97.9 F (36.6 C), temperature source Oral, resp. rate 19, height 5\' 8"  (1.727 m), weight 79.4 kg, SpO2 100%.  Filed Weights   02/10/24 0712 02/11/24 0338  Weight: 78 kg 79.4 kg    GEN: Well nourished, well developed, in no acute distress HEENT: normal Neck: no JVD or masses Cardiac: RRR; soft flow murmur @ RUSB. No rubs, or gallops,no edema  Respiratory:  clear to auscultation bilaterally, normal work of breathing GI: soft, nontender, nondistended, + BS MS: no deformity or atrophy Skin: warm and dry, no rash.  Groin sites clear without hematoma or ecchymosis  Neuro:  Alert and  Oriented x 3, Strength and sensation are intact Psych: euthymic mood, full affect  Disposition   Pt is being discharged home today in good condition.  Follow-up Plans & Appointments     Follow-up Information     Kathleene Hazel, MD. Go on 02/20/2024.   Specialty: Cardiology Why: @11 :40am, please arrive at least 15 minutes early Contact information: [address] [place] [address] [phone]  Discharge Medications   Allergies as of 02/11/2024       Reactions   Bcg Live Other (See Comments)   Treatment pt had- shakes   Septra [bactrim] Other (See Comments)   Muscle weakness, numbness   Sulfa Antibiotics Other (See Comments)   Severe weakness   Pertussis Vaccines    History of Gillian barrie   Iodinated Contrast Media Rash        Medication List     STOP taking these medications    predniSONE 50 MG tablet Commonly known as: DELTASONE       TAKE these medications    acetaminophen 650 MG CR tablet Commonly known as: TYLENOL Take 650 mg by mouth every 8 (eight) hours as needed for pain.   amoxicillin-clavulanate 875-125 MG tablet Commonly known as: AUGMENTIN Take 1 tablet by mouth 2 (two) times daily.   atorvastatin 80 MG tablet Commonly known as: LIPITOR Take 1 tablet (80 mg total) by mouth daily.   BAYER ASPIRIN PO Take 81 mg by mouth daily.   benzonatate 100 MG capsule Commonly known as: TESSALON Take 1 capsule (100 mg total) by mouth 3 (three) times daily as needed.   brimonidine 0.2 % ophthalmic solution Commonly known as: ALPHAGAN Place 1 drop into the right eye 3 (three) times daily.   cholecalciferol 25 MCG (1000 UNIT) tablet Commonly known as: VITAMIN D3 Take 1,000 Units by mouth daily.   clopidogrel 75 MG tablet Commonly known as: PLAVIX Take 1 tablet (75 mg total) by mouth daily.   CoQ10 400 MG Caps Take 400 mg by mouth daily.   CRANBERRY PO Take 650 mg by mouth daily.    diphenhydrAMINE 25 MG tablet Commonly known as: BENADRYL Take 25 mg by mouth every 6 (six) hours as needed for itching.   dorzolamide-timolol 2-0.5 % ophthalmic solution Commonly known as: COSOPT Place 1 drop into the right eye 2 (two) times daily.   ferrous sulfate 325 (65 FE) MG tablet Take 325 mg by mouth daily.   Fish Oil 1000 MG Caps Take 1,000 mg by mouth daily.   HAIR SKIN NAILS PO Take 1 tablet by mouth daily.   hyoscyamine 0.125 MG SL tablet Commonly known as: LEVSIN SL DISSOLVE 1 TABLET IN MOUTH EVERY 8 HOURS AS NEEDED   levothyroxine 137 MCG tablet Commonly known as: SYNTHROID Take 1 tablet (137 mcg total) by mouth daily before breakfast.   loratadine 10 MG tablet Commonly known as: CLARITIN Take 10 mg by mouth daily.   Magnesium 400 MG Tabs Take 400 mg by mouth daily.   multivitamin with minerals Tabs tablet Take 1 tablet by mouth daily. Centrum silver 50+   NON FORMULARY Take 15 mg by mouth at bedtime. CBD Gummy hemp isolate   NON FORMULARY Take 3 tablets by mouth 3 (three) times daily as needed (ear ring). Flavonoid   Rocklatan 0.02-0.005 % Soln Generic drug: Netarsudil-Latanoprost Place 1 drop into both eyes at bedtime.   sodium chloride 0.65 % Soln nasal spray Commonly known as: OCEAN Place 1 spray into both nostrils daily. May continue to use throughout the day as needed for congestion   Tums Ultra 1000 1000 MG chewable tablet Generic drug: calcium elemental as carbonate Chew 1,000 mg by mouth at bedtime.         Outstanding Labs/Studies   none  ______________________  Duration of Discharge Encounter: APP Time: 20 minutes    Signed, Cline Crock, PA-C 02/11/2024, 8:56 AM [phone]  I have personally  seen and examined this patient. I agree with the assessment and plan as outlined above.  Severe aortic stenosis s/p TAVR yesterday with 26 mm Edwards Sapien 3 Ultra Resilia THV from transfemoral approach with Sentinel  protection device.  Doing well today.  No complaints.  BP stable. Sinus on telemetry. EKG reviewed by me and no new AV block or BBB.  My exam: NAD  Soft systolic murmur  Lungs: clear bilaterally  Ext: no LE edema. Bilateral groins soft without hematoma Echo reviewed by me. Normal LV function. Trivial PVL. Small color jet from valve to RVOT in short axis views. Reviewed with Dr. Eden Emms and Dr. Flora Lipps. If this is a VSD, it is small and restrictive and will likely be of no clinical significance.  Plan: Will d/c home today. Continue Plavix. Follow up one week. Repeat echo one month.   Time spent by me with chart review, lab review, EKG review, echo image review, examination and plan formulation including documentation was 25 minutes.   Verne Carrow, MD, [facility] 02/11/2024 9:46 AM

## 2024-02-10 NOTE — Op Note (Signed)
 [facility]   MULTIDISCIPLINARY HEART VALVE TEAM   TAVR OPERATIVE NOTE   Date of Procedure:  02/10/2024  Preoperative Diagnosis: Severe Aortic Stenosis   Postoperative Diagnosis: Same   Procedure:   Transcatheter Aortic Valve Replacement - Percutaneous Left Transfemoral Approach  Edwards Sapien 3 Ultra Resilia THV (size 26 mm, model # 9755RSL, serial # [id number])   Co-Surgeons:  Alleen Borne, MD and Verne Carrow, MD   Anesthesiologist:  A. Hodierne, MD  Echocardiographer:  W. O'Neal, MD  Pre-operative Echo Findings: Severe aortic stenosis Normal left ventricular systolic function  Post-operative Echo Findings: trivial paravalvular leak normal left ventricular systolic function   BRIEF CLINICAL NOTE AND INDICATIONS FOR SURGERY  This 83 year old gentleman has stage C, asymptomatic, critical aortic stenosis with a heavily calcified and thickened aortic valve with restricted leaflet mobility.  The mean gradient is 54.5 mmHg with a dimensionless index of 0.21 and a valve area of 0.66 cm.  Left ventricular systolic function is normal.  He did recently have an episode where he fell off an elliptical machine at the gym after stopping a workout and there was some suggestion that he may have gotten dizzy before falling.  He has NYHA class I symptoms. I agree that aortic valve replacement is indicated in this patient to prevent further left ventricular hypertrophy and progressive left ventricular dysfunction as well as to prevent development of worsening symptoms or sudden death.  Given his advanced age I think transcatheter aortic valve replacement would be the best option for treating him.  His gated cardiac CTA shows anatomy suitable for TAVR using a 26 mm SAPIEN 3 valve.  His abdominal and pelvic CTA shows adequate pelvic vascular anatomy to allow transfemoral insertion.  His valve is very calcified and we could consider using a Sentinel device for embolic  protection although he has a very tortuous innominate artery which may make placement difficult.   The patient and his wife were counseled at length regarding treatment alternatives for management of severe symptomatic aortic stenosis. The risks and benefits of surgical intervention has been discussed in detail. Long-term prognosis with medical therapy was discussed. Alternative approaches such as conventional surgical aortic valve replacement, transcatheter aortic valve replacement, and palliative medical therapy were compared and contrasted at length. This discussion was placed in the context of the patient's own specific clinical presentation and past medical history. All of their questions have been addressed.    Following the decision to proceed with transcatheter aortic valve replacement, a discussion was held regarding what types of management strategies would be attempted intraoperatively in the event of life-threatening complications, including whether or not the patient would be considered a candidate for the use of cardiopulmonary bypass and/or conversion to open sternotomy for attempted surgical intervention.  I think he would be a candidate for emergent sternotomy to manage any intraoperative complications.  The patient is aware of the fact that transient use of cardiopulmonary bypass may be necessary. The patient has been advised of a variety of complications that might develop including but not limited to risks of death, stroke, paravalvular leak, aortic dissection or other major vascular complications, aortic annulus rupture, device embolization, cardiac rupture or perforation, mitral regurgitation, acute myocardial infarction, arrhythmia, heart block or bradycardia requiring permanent pacemaker placement, congestive heart failure, respiratory failure, renal failure, pneumonia, infection, other late complications related to structural valve deterioration or migration, or other complications that  might ultimately cause a temporary or permanent loss of functional independence or other long  term morbidity. The patient provides full informed consent for the procedure as described and all questions were answered.     DETAILS OF THE OPERATIVE PROCEDURE  PREPARATION:    The patient was brought to the operating room on the above mentioned date and appropriate monitoring was established by the anesthesia team. The patient was placed in the supine position on the operating table.  Intravenous antibiotics were administered. The patient was monitored closely throughout the procedure under conscious sedation.   Baseline transthoracic echocardiogram was performed. The patient's abdomen and both groins were prepped and draped in a sterile manner. A time out procedure was performed.   PERIPHERAL ACCESS:    A Sentinel cerebral protection device was inserted via the right radial artery.  Using the modified Seldinger technique, femoral arterial and venous access was obtained with placement of 6 Fr sheaths on the right side.  A pigtail diagnostic catheter was passed through the right arterial sheath under fluoroscopic guidance into the aortic root.  A temporary transvenous pacemaker catheter was passed through the right femoral venous sheath under fluoroscopic guidance into the right ventricle.  The pacemaker was tested to ensure stable lead placement and pacemaker capture. Aortic root angiography was performed in order to determine the optimal angiographic angle for valve deployment.   TRANSFEMORAL ACCESS:   Percutaneous transfemoral access and sheath placement was performed using ultrasound guidance.  The left common femoral artery was cannulated using a micropuncture needle and appropriate location was verified using hand injection angiogram.  A pair of Abbott Perclose percutaneous closure devices were placed and a 6 French sheath replaced into the femoral artery.  The patient was heparinized  systemically and ACT verified > 250 seconds.    A 14 Fr transfemoral E-sheath was introduced into the left common femoral artery after progressively dilating over an Amplatz superstiff wire. An AL-2 catheter was used to direct a straight-tip exchange length wire across the native aortic valve into the left ventricle. This was exchanged out for a pigtail catheter and position was confirmed in the LV apex. Simultaneous LV and Ao pressures were recorded.  The pigtail catheter was exchanged for a Safari wire in the LV apex.   BALLOON AORTIC VALVULOPLASTY:   Not performed   TRANSCATHETER HEART VALVE DEPLOYMENT:   An Edwards Sapien 3 Ultra transcatheter heart valve (size 26 mm) was prepared and crimped per manufacturer's guidelines, and the proper orientation of the valve is confirmed on the [employer] delivery system. The valve was advanced through the introducer sheath using normal technique until in an appropriate position in the abdominal aorta beyond the sheath tip. The balloon was then retracted and using the fine-tuning wheel was centered on the valve. The valve was then advanced across the aortic arch using appropriate flexion of the catheter. The valve was carefully positioned across the aortic valve annulus. The Commander catheter was retracted using normal technique. Once final position of the valve has been confirmed by angiographic assessment, the valve is deployed during rapid ventricular pacing to maintain systolic blood pressure < 50 mmHg and pulse pressure < 10 mmHg. The balloon inflation is held for >3 seconds after reaching full deployment volume. Once the balloon has fully deflated the balloon is retracted into the ascending aorta and valve function is assessed using echocardiography. There is felt to be trivial paravalvular leak and no central aortic insufficiency.  The patient's hemodynamic recovery following valve deployment is good.  The deployment balloon and guidewire are both  removed.  PROCEDURE COMPLETION:   The sheath was removed and femoral artery closure performed.  Protamine was administered once femoral arterial repair was complete. The temporary pacemaker, pigtail catheter and femoral sheaths were removed with manual pressure used for venous hemostasis.  A Mynx femoral closure device was utilized following removal of the diagnostic sheath in the right femoral artery.  The patient tolerated the procedure well and is transported to the cath lab recovery area in stable condition. There were no immediate intraoperative complications. All sponge instrument and needle counts are verified correct at completion of the operation.   No blood products were administered during the operation.  The patient received a total of 65 mL of intravenous contrast during the procedure.   Alleen Borne, MD 02/10/2024 1:01 PM

## 2024-02-10 NOTE — Transfer of Care (Signed)
 Immediate Anesthesia Transfer of Care Note  Patient: Anthony Noble.  Procedure(s) Performed: Transcatheter Aortic Valve Replacement, Transfemoral Approach with Cerebral Embolic Protection ECHOCARDIOGRAM, TRANSTHORACIC  Patient Location: Cath Lab  Anesthesia Type:MAC  Level of Consciousness: drowsy and patient cooperative  Airway & Oxygen Therapy: Patient Spontanous Breathing and Patient connected to nasal cannula oxygen  Post-op Assessment: Report given to RN, Post -op Vital signs reviewed and stable, and Patient moving all extremities X 4  Post vital signs: Reviewed and stable  Last Vitals:  Vitals Value Taken Time  BP 116/51 02/10/24 1251  Temp    Pulse 72 02/10/24 1252  Resp 20 02/10/24 1252  SpO2 91 % 02/10/24 1252  Vitals shown include unfiled device data.  Last Pain:  Vitals:   02/10/24 1232  TempSrc:   PainSc: Asleep         Complications: There were no known notable events for this encounter.

## 2024-02-11 ENCOUNTER — Inpatient Hospital Stay (HOSPITAL_COMMUNITY)

## 2024-02-11 DIAGNOSIS — Z952 Presence of prosthetic heart valve: Secondary | ICD-10-CM | POA: Diagnosis not present

## 2024-02-11 DIAGNOSIS — I350 Nonrheumatic aortic (valve) stenosis: Secondary | ICD-10-CM | POA: Diagnosis not present

## 2024-02-11 DIAGNOSIS — Z006 Encounter for examination for normal comparison and control in clinical research program: Secondary | ICD-10-CM | POA: Diagnosis not present

## 2024-02-11 DIAGNOSIS — I5031 Acute diastolic (congestive) heart failure: Secondary | ICD-10-CM | POA: Diagnosis not present

## 2024-02-11 DIAGNOSIS — C679 Malignant neoplasm of bladder, unspecified: Secondary | ICD-10-CM | POA: Diagnosis not present

## 2024-02-11 LAB — BASIC METABOLIC PANEL
Anion gap: 10 (ref 5–15)
BUN: 18 mg/dL (ref 8–23)
CO2: 20 mmol/L — ABNORMAL LOW (ref 22–32)
Calcium: 8.4 mg/dL — ABNORMAL LOW (ref 8.9–10.3)
Chloride: 107 mmol/L (ref 98–111)
Creatinine, Ser: 1.04 mg/dL (ref 0.61–1.24)
GFR, Estimated: >60 mL/min (ref >60)
Glucose, Bld: 100 mg/dL — ABNORMAL HIGH (ref 70–99)
Potassium: 3.6 mmol/L (ref 3.5–5.1)
Sodium: 137 mmol/L (ref 135–145)

## 2024-02-11 LAB — CBC
HCT: 38.3 % — ABNORMAL LOW (ref 39.0–52.0)
Hemoglobin: 12.9 g/dL — ABNORMAL LOW (ref 13.0–17.0)
MCH: 29.8 pg (ref 26.0–34.0)
MCHC: 33.7 g/dL (ref 30.0–36.0)
MCV: 88.5 fL (ref 80.0–100.0)
Platelets: 216 10*3/uL (ref 150–400)
RBC: 4.33 MIL/uL (ref 4.22–5.81)
RDW: 13.4 % (ref 11.5–15.5)
WBC: 18.4 10*3/uL — ABNORMAL HIGH (ref 4.0–10.5)
nRBC: 0 % (ref 0.0–0.2)

## 2024-02-11 LAB — ECHOCARDIOGRAM COMPLETE
AR max vel: 1.85 cm2
AV Area VTI: 2.88 cm2
AV Area mean vel: 2.47 cm2
AV Mean grad: 11 mmHg
AV Peak grad: 23.6 mmHg
Ao pk vel: 2.43 m/s
Area-P 1/2: 1.85 cm2
Height: 68 in
MV VTI: 2.57 cm2
S' Lateral: 2.4 cm
Weight: 2800 [oz_av]

## 2024-02-11 LAB — MAGNESIUM: Magnesium: 2.1 mg/dL (ref 1.7–2.4)

## 2024-02-11 NOTE — Progress Notes (Signed)
 Went over discharge paper work and follow up appointments. Pt and pt's wife voiced understanding. IV d/ced, pressure applied till hemostasis obtained. Disconnected from tele, CCMD notified. Pt went home with via  private auto.

## 2024-02-11 NOTE — Progress Notes (Signed)
 Discussed with pt and wife restrictions, walking at home, returning to the gym, and CRPII. Pt receptive. He declines CRPII due to already exercising 5 days a week. However he knows he can ask for a referral from his cardiologist if he changes his mind. [id number] Ethelda Chick BS, ACSM-CEP 02/11/2024 9:16 AM

## 2024-02-11 NOTE — Progress Notes (Signed)
 Mobility Specialist Progress Note:   02/11/24 1134  Mobility  Activity Ambulated independently in hallway;Ambulated independently in room  Level of Assistance Independent  Assistive Device None  Distance Ambulated (ft) 350 ft  Activity Response Tolerated well  Mobility Referral Yes  Mobility visit 1 Mobility  Mobility Specialist Start Time (ACUTE ONLY) 1115  Mobility Specialist Stop Time (ACUTE ONLY) 1125  Mobility Specialist Time Calculation (min) (ACUTE ONLY) 10 min   Pt received in bed, agreeable to mobility session. Ambulated independently in hallway with no AD. SV for safety. Tolerated well, asx throughout. Returned pt to room, eager for d/c. Wife by bedside. All needs met.  Feliciana Rossetti Mobility Specialist Please contact via Special educational needs teacher or  Rehab office at [phone]

## 2024-02-11 NOTE — Progress Notes (Signed)
 Echocardiogram 2D Echocardiogram has been performed.  Warren Lacy Kelis Plasse RDCS 02/11/2024, 9:07 AM  Notified Dr Clifton James

## 2024-02-11 NOTE — Plan of Care (Signed)

## 2024-02-12 ENCOUNTER — Telehealth: Payer: Self-pay | Admitting: Physician Assistant

## 2024-02-12 NOTE — Telephone Encounter (Signed)
  [facility]   MULTIDISCIPLINARY HEART VALVE TEAM   Patient contacted regarding discharge from [facility] on 02/11/24  Patient understands to follow up with Dr. Clifton James on 3/14 at [address] [facility].  Patient understands discharge instructions? yes Patient understands medications and regimen? yes Patient understands to bring all medications to this visit? Yes  Has had some mild pain at groin sites but getting better.   Cline Crock PA-C  [facility]

## 2024-02-20 ENCOUNTER — Ambulatory Visit: Attending: Cardiovascular Disease | Admitting: Cardiovascular Disease

## 2024-02-20 ENCOUNTER — Encounter: Payer: Self-pay | Admitting: Cardiovascular Disease

## 2024-02-20 VITALS — BP 130/68 | HR 69 | Ht 68.0 in | Wt 180.0 lb

## 2024-02-20 DIAGNOSIS — Z952 Presence of prosthetic heart valve: Secondary | ICD-10-CM

## 2024-02-20 DIAGNOSIS — I350 Nonrheumatic aortic (valve) stenosis: Secondary | ICD-10-CM

## 2024-02-20 NOTE — Progress Notes (Signed)
 [facility] Note  Chief Complaint  Patient presents with   Follow-up    Post TAVR    History of Present Illness: 83 yo male with history of anemia, carotid artery disease, hyperlipidemia, hypothyroidism, bladder cancer and severe aortic stenosis now one week post TAVR and here today for follow up. He was found to have severe aortic stenosis by echo in June 2024. Cardiac cath January 2025 with severe RCA stenosis treated with a drug eluting stent. CTA with 3 cm infrarenal saccular aneurysm with associated penetrating ulcer. He was seen in [facility] by Dr. Chestine Spore and cleared to proceed with TAVR with likely future aortic repair. He underwent TAVR on 02/10/24 with placement of a  26 mm Edwards Sapien 3 Ultra Resilia THV from the transfemoral approach with Sentinel cerebral protection in place. He did well following his TAVR. Elevated LV pressures during TAVR and treated with IV Lasix post procedure. Echo on 02/11/24 with LVEF=70-75%. Possible small VSD. Normally functioning AVR with no PVL. Echo reviewed with imaging team and his primary cardiologist Dr. Flora Lipps and we elected to follow this for now. He was discharged home on ASA and Plavix.   He is here today for follow up. The patient denies any chest pain, dyspnea, palpitations, lower extremity edema, orthopnea, PND, dizziness, near syncope or syncope. He is ready to play golf.   Primary Care Physician: Dettinger, Elige Radon, MD Primary Cardiologist: O'Neal  Past Medical History:  Diagnosis Date   Anemia    Bilateral carotid artery stenosis without cerebral infarction    vascular--- dr Chestine Spore;   hx CVA;  last duplex  in epic 02-11-2023 left ICA 1-39%,  righrt ICA 40-59%,  bilateral ECA >50%   , pt to maintain on ASA 81mg  and statin   BPH (benign prostatic hypertrophy) with urinary obstruction    urologist--- dr Annabell Howells   CAD S/P percutaneous coronary angioplasty 12/25/2023   s/p PTCA/DES x 1 proximal RCA   Coronary artery disease     Diverticulosis of colon    Glaucoma    Guillain Barr syndrome (HCC) 2001   History of bladder cancer 2004   s/p TURBT  and BCG treatment's   History of colon polyps    History of pleural effusion 2001   s/p  VATS w/ drainage   History of vertebral fracture 2001   2001--   T12   Hyperlipidemia    Hypothyroidism    followed by pcp   S/P TAVR (transcatheter aortic valve replacement) 02/10/2024   s/p TAVR with a 26 mm Edwards S3UR via the TF approach using Sentinel Cerbral Embolic Protection by Dr. Clifton James & Dr. Laneta Simmers   Severe aortic stenosis    Tinnitus    Varicose veins of both lower extremities     Past Surgical History:  Procedure Laterality Date   CORONARY STENT INTERVENTION N/A 12/25/2023   Procedure: CORONARY STENT INTERVENTION;  Surgeon: Kathleene Hazel, MD;  Location: [facility];  Service: Cardiovascular;  Laterality: N/A;   CYSTO/  BILATERAL RETROGRADE PYLOGRAM/  RESECTION BLADDER TUMOR  07/30/2011   @[facility]   by dr Annabell Howells   CYSTOSCOPY WITH BIOPSY N/A 01/12/2015   Procedure: CYSTOSCOPY WITH BLADDER  BIOPSY, FULGERATION;  Surgeon: Anner Crete, MD;  Location: [facility];  Service: Urology;  Laterality: N/A;   CYSTOSCOPY WITH BIOPSY Bilateral 05/13/2023   Procedure: CYSTOSCOPY,BLADDER  BIOPSY WITH FULGURATION, BILATERAL RETROGRADE PYELOGRAM;  Surgeon: Bjorn Pippin, MD;  Location: [facility];  Service: Urology;  Laterality: Bilateral;  1 HR FOR CASE   INTRAOPERATIVE TRANSTHORACIC ECHOCARDIOGRAM N/A 02/10/2024   Procedure: ECHOCARDIOGRAM, TRANSTHORACIC;  Surgeon: Kathleene Hazel, MD;  Location: [facility];  Service: Cardiovascular;  Laterality: N/A;   ORIF RIGHT DISTAL RADIUS FX  12/31/2007   @[facility]   by dr c. blackman   RIGHT HEART CATH AND CORONARY ANGIOGRAPHY N/A 12/25/2023   Procedure: RIGHT HEART CATH AND CORONARY ANGIOGRAPHY;  Surgeon: Kathleene Hazel, MD;  Location: [facility];  Service: Cardiovascular;  Laterality: N/A;    TONSILLECTOMY     child   TRANSURETHRAL RESECTION OF BLADDER TUMOR WITH MITOMYCIN-C  06/07/2003   @[facility]  by dr Annabell Howells   VIDEO ASSISTED THORACOSCOPY (VATS)/EMPYEMA Right 05/01/2000   @[facility]   by dr Dorris Fetch;  and Drainage of effusion    Current Outpatient Medications  Medication Sig Dispense Refill   acetaminophen (TYLENOL) 650 MG CR tablet Take 650 mg by mouth every 8 (eight) hours as needed for pain.     amoxicillin-clavulanate (AUGMENTIN) 875-125 MG tablet Take 1 tablet by mouth 2 (two) times daily. 14 tablet 0   atorvastatin (LIPITOR) 80 MG tablet Take 1 tablet (80 mg total) by mouth daily. 90 tablet 1   BAYER ASPIRIN PO Take 81 mg by mouth daily.     benzonatate (TESSALON) 100 MG capsule Take 1 capsule (100 mg total) by mouth 3 (three) times daily as needed. 30 capsule 0   brimonidine (ALPHAGAN) 0.2 % ophthalmic solution Place 1 drop into the right eye 3 (three) times daily.     calcium elemental as carbonate (TUMS ULTRA 1000) 400 MG chewable tablet Chew 1,000 mg by mouth at bedtime.     cholecalciferol (VITAMIN D3) 25 MCG (1000 UNIT) tablet Take 1,000 Units by mouth daily.     clopidogrel (PLAVIX) 75 MG tablet Take 1 tablet (75 mg total) by mouth daily. 90 tablet 1   Coenzyme Q10 (COQ10) 400 MG CAPS Take 400 mg by mouth daily.     CRANBERRY PO Take 650 mg by mouth daily.     diphenhydrAMINE (BENADRYL) 25 MG tablet Take 25 mg by mouth every 6 (six) hours as needed for itching.     dorzolamide-timolol (COSOPT) 22.3-6.8 MG/ML ophthalmic solution Place 1 drop into the right eye 2 (two) times daily.     ferrous sulfate 325 (65 FE) MG tablet Take 325 mg by mouth daily.     hyoscyamine (LEVSIN SL) 0.125 MG SL tablet DISSOLVE 1 TABLET IN MOUTH EVERY 8 HOURS AS NEEDED 120 tablet 2   levothyroxine (SYNTHROID) 137 MCG tablet Take 1 tablet (137 mcg total) by mouth daily before breakfast. 90 tablet 1   loratadine (CLARITIN) 10 MG tablet Take 10 mg by mouth daily.     Magnesium 400 MG TABS Take 400  mg by mouth daily.     Multiple Vitamin (MULTIVITAMIN WITH MINERALS) TABS tablet Take 1 tablet by mouth daily. Centrum silver 50+     Multiple Vitamins-Minerals (HAIR SKIN NAILS PO) Take 1 tablet by mouth daily.     Netarsudil-Latanoprost (ROCKLATAN) 0.02-0.005 % SOLN Place 1 drop into both eyes at bedtime.     NON FORMULARY Take 15 mg by mouth at bedtime. CBD Gummy hemp isolate     NON FORMULARY Take 3 tablets by mouth 3 (three) times daily as needed (ear ring). Flavonoid     Omega-3 Fatty Acids (FISH OIL) 1000 MG CAPS Take 1,000 mg by mouth daily.     sodium chloride (OCEAN)  0.65 % SOLN nasal spray Place 1 spray into both nostrils daily. May continue to use throughout the day as needed for congestion     No current facility-administered medications for this visit.    Allergies  Allergen Reactions   Bcg Live Other (See Comments)    Treatment pt had- shakes   Septra [Bactrim] Other (See Comments)    Muscle weakness, numbness   Sulfa Antibiotics Other (See Comments)    Severe weakness   Pertussis Vaccines     History of Gillian barrie    Iodinated Contrast Media Rash    Social History   Socioeconomic History   Marital status: Married    Spouse name: Not on file   Number of children: 2   Years of education: Not on file   Highest education level: Not on file  Occupational History   Occupation: retired    Comment: self employed-printing  Tobacco Use   Smoking status: Former    Current packs/day: 0.00    Average packs/day: 1 pack/day for 20.0 years (20.0 ttl pk-yrs)    Types: Cigarettes    Start date: 1963    Quit date: 1983    Years since quitting: 42.2    Passive exposure: Never   Smokeless tobacco: Never  Vaping Use   Vaping status: Never Used  Substance and Sexual Activity   Alcohol use: Yes    Alcohol/week: 2.0 standard drinks of alcohol    Types: 1 Shots of liquor, 1 Standard drinks or equivalent per week   Drug use: No    Comment: Hemp gummies   Sexual  activity: Not on file  Other Topics Concern   Not on file  Social History Narrative   Lives at home with wife and daughter.      Social Drivers of Corporate investment banker Strain: Low Risk  (05/02/2020)   Overall Financial Resource Strain (CARDIA)    Difficulty of Paying Living Expenses: Not hard at all  Food Insecurity: No Food Insecurity (02/10/2024)   Hunger Vital Sign    Worried About Running Out of Food in the Last Year: Never true    Ran Out of Food in the Last Year: Never true  Transportation Needs: No Transportation Needs (02/10/2024)   PRAPARE - Administrator, Civil Service (Medical): No    Lack of Transportation (Non-Medical): No  Physical Activity: Sufficiently Active (05/02/2020)   Exercise Vital Sign    Days of Exercise per Week: 5 days    Minutes of Exercise per Session: 60 min  Stress: No Stress Concern Present (05/02/2020)   [name] of Occupational Health - Occupational Stress Questionnaire    Feeling of Stress : Not at all  Social Connections: Socially Integrated (02/10/2024)   Social Connection and Isolation Panel [NHANES]    Frequency of Communication with Friends and Family: More than three times a week    Frequency of Social Gatherings with Friends and Family: More than three times a week    Attends Religious Services: More than 4 times per year    Active Member of [employer] or Organizations: Yes    Attends Banker Meetings: More than 4 times per year    Marital Status: Married  Catering manager Violence: Not At Risk (02/10/2024)   Humiliation, Afraid, Rape, and Kick questionnaire    Fear of Current or Ex-Partner: No    Emotionally Abused: No    Physically Abused: No    Sexually Abused: No  Family History  Problem Relation Age of Onset   Other Mother        varicose veins   Cancer Mother        Ovarian   Varicose Veins Mother    Nephrolithiasis Mother    COPD Father    Hyperlipidemia Father    Cancer Maternal  Grandmother    Bipolar disorder Daughter    Healthy Daughter    Hypertension Sister    Healthy Daughter    Colon cancer Neg Hx     Review of Systems:  As stated in the HPI and otherwise negative.   BP 130/68   Pulse 69   Ht 5\' 8"  (1.727 m)   Wt 81.6 kg   SpO2 97%   BMI 27.37 kg/m   Physical Examination: General: Well developed, well nourished, NAD  HEENT: OP clear, mucus membranes moist  SKIN: warm, dry. No rashes. Neuro: No focal deficits  Musculoskeletal: Muscle strength 5/5 all ext  Psychiatric: Mood and affect normal  Neck: No JVD, no carotid bruits, no thyromegaly, no lymphadenopathy.  Lungs:Clear bilaterally, no wheezes, rhonci, crackles Cardiovascular: Regular rate and rhythm. Soft systolic murmur.  Abdomen:Soft. Bowel sounds present. Non-tender.  Extremities: No lower extremity edema. Pulses are 2 + in the bilateral DP/PT.  EKG:  EKG is ordered today. The ekg ordered today demonstrates  EKG Interpretation Date/Time:  Friday February 20 2024 11:30:00 EDT Ventricular Rate:  69 PR Interval:  178 QRS Duration:  116 QT Interval:  432 QTC Calculation: 462 R Axis:   -53  Text Interpretation: Sinus rhythm with occasional Premature ventricular complexes and Premature atrial complexes with ventricular escape complexes Left anterior fascicular block Minimal voltage criteria for LVH, may be normal variant ( Cornell product ) Confirmed by Verne Carrow [phone]) on 02/20/2024 12:12:25 PM    Echo 02/11/24:  1. Cannot r/o VSD post procedure Not appreciated in apical views but  abnormal color flow noted from valve to RVOT in basal short axis images.  Clinical correlation with murmur , sat run or TEE suggested.   2. Strain not performed . Left ventricular ejection fraction, by  estimation, is 70 to 75%. The left ventricle has hyperdynamic function.  The left ventricle has no regional wall motion abnormalities. There is  moderate concentric left ventricular  hypertrophy.  Left ventricular diastolic parameters are consistent with  Grade I diastolic dysfunction (impaired relaxation).   3. Right ventricular systolic function is normal. The right ventricular  size is normal. Tricuspid regurgitation signal is inadequate for assessing  PA pressure.   4. Left atrial size was moderately dilated.   5. The mitral valve is abnormal. Trivial mitral valve regurgitation.   6. Two trivial PVLs at the 12 and 6 oclock positions. The aortic valve  has been repaired/replaced. Aortic valve regurgitation is trivial. There  is a 26 mm Edwards Sapien 3 Ultra valve present in the aortic position.  Procedure Date: 02/10/24. Echo findings   are consistent with normal structure and function of the aortic valve  prosthesis. Aortic valve area, by VTI measures 2.88 cm. Aortic valve mean  gradient measures 11.0 mmHg. Aortic valve Vmax measures 2.43 m/s.   7. The inferior vena cava is normal in size with greater than 50%  respiratory variability, suggesting right atrial pressure of 3 mmHg.   FINDINGS   Left Ventricle: Strain not performed. Left ventricular ejection fraction,  by estimation, is 70 to 75%. The left ventricle has hyperdynamic function.  The left ventricle has no  regional wall motion abnormalities. Strain was  performed and the global  longitudinal strain is indeterminate. The left ventricular internal cavity  size was normal in size. There is moderate concentric left ventricular  hypertrophy. Left ventricular diastolic parameters are consistent with  Grade I diastolic dysfunction  (impaired relaxation). The ratio of pulmonic flow to systemic flow (Qp/Qs  ratio) is 0.70.   Right Ventricle: The right ventricular size is normal. No increase in  right ventricular wall thickness. Right ventricular systolic function is  normal. Tricuspid regurgitation signal is inadequate for assessing PA  pressure.   Left Atrium: Left atrial size was moderately dilated.   Right Atrium:  Right atrial size was normal in size.   Pericardium: Trivial pericardial effusion is present.   Mitral Valve: The mitral valve is abnormal. Mild mitral annular  calcification. Trivial mitral valve regurgitation. MV peak gradient, 10.2  mmHg. The mean mitral valve gradient is 4.0 mmHg.   Tricuspid Valve: The tricuspid valve is normal in structure. Tricuspid  valve regurgitation is trivial.   Aortic Valve: Two trivial PVLs at the 12 and 6 oclock positions. The  aortic valve has been repaired/replaced. Aortic valve regurgitation is  trivial. Aortic valve mean gradient measures 11.0 mmHg. Aortic valve peak  gradient measures 23.6 mmHg. Aortic  valve area, by VTI measures 2.88 cm. There is a 26 mm Edwards Sapien 3  Ultra valve present in the aortic position. Procedure Date: 02/10/24. Echo  findings are consistent with normal structure and function of the aortic  valve prosthesis.   Pulmonic Valve: The pulmonic valve was normal in structure. Pulmonic valve  regurgitation is trivial.   Aorta: The aortic root and ascending aorta are structurally normal, with  no evidence of dilitation.   Venous: The inferior vena cava is normal in size with greater than 50%  respiratory variability, suggesting right atrial pressure of 3 mmHg.   IAS/Shunts: No atrial level shunt detected by color flow Doppler. The  ratio of pulmonic flow to systemic flow (Qp/Qs ratio) is 0.70.   Additional Comments: Cannot r/o VSD post procedure Not appreciated in  apical views but abnormal color flow noted from valve to RVOT in basal  short axis images. Clinical correlation with murmur , sat run or TEE  suggested. 3D was performed not requiring  image post processing on an independent workstation and was indeterminate.     LEFT VENTRICLE  PLAX 2D  LVIDd:         4.10 cm   Diastology  LVIDs:         2.40 cm   LV e' medial:    5.33 cm/s  LV PW:         1.30 cm   LV E/e' medial:  16.9  LV IVS:        1.40 cm   LV e'  lateral:   7.62 cm/s  LVOT diam:     2.30 cm   LV E/e' lateral: 11.9  LV SV:         119  LV SV Index:   62  LVOT Area:     4.15 cm     RIGHT VENTRICLE  RV S prime:     18.30 cm/s  RVOT diam:      2.20 cm  TAPSE (M-mode): 2.6 cm   LEFT ATRIUM             Index        RIGHT ATRIUM  Index  LA diam:        4.50 cm 2.33 cm/m   RA Area:     14.90 cm  LA Vol (A2C):   57.7 ml 29.88 ml/m  RA Volume:   34.10 ml  17.66 ml/m  LA Vol (A4C):   77.4 ml 40.08 ml/m  LA Biplane Vol: 70.8 ml 36.67 ml/m   AORTIC VALVE                     PULMONIC VALVE  AV Area (Vmax):    1.85 cm      RVOT Peak grad: 6 mmHg  AV Area (Vmean):   2.47 cm  AV Area (VTI):     2.88 cm  AV Vmax:           243.00 cm/s  AV Vmean:          146.000 cm/s  AV VTI:            0.414 m  AV Peak Grad:      23.6 mmHg  AV Mean Grad:      11.0 mmHg  LVOT Vmax:         108.00 cm/s  LVOT Vmean:        86.900 cm/s  LVOT VTI:          0.287 m  LVOT/AV VTI ratio: 0.69    AORTA  Ao Asc diam: 3.10 cm   MITRAL VALVE  MV Area (PHT): 1.85 cm     SHUNTS  MV Area VTI:   2.57 cm     Systemic VTI:  0.29 m  MV Peak grad:  10.2 mmHg    Systemic Diam: 2.30 cm  MV Mean grad:  4.0 mmHg     Pulmonic VTI:  0.231 m  MV Vmax:       1.60 m/s     Pulmonic Diam: 2.20 cm  MV Vmean:      92.4 cm/s    Qp/Qs:         0.74  MV Decel Time: 411 msec  MV E velocity: 90.30 cm/s  MV A velocity: 152.00 cm/s  MV E/A ratio:  0.59   Recent Labs: 12/30/2023: TSH 4.200 02/06/2024: ALT 20 02/11/2024: BUN 18; Creatinine, Ser 1.04; Hemoglobin 12.9; Magnesium 2.1; Platelets 216; Potassium 3.6; Sodium 137    Wt Readings from Last 3 Encounters:  02/20/24 81.6 kg  02/11/24 79.4 kg  01/28/24 82.6 kg    Assessment and Plan:   1. Severe Aortic Valve Stenosis s/p TAVR. He is doing well post TAVR. NYHA class 1. He is feeling well. I reviewed his echo today. As above, his AVR is working well but there is a VSD post TAVR. Since he is doing well  clinically, will follow for now with repeat echo one month post TAVR. Groins without hematoma. Continue ASA and Plavix. SBE prophylaxis as indicated. He has a prescription for amoxicillin with him today.   Labs/ tests ordered today include:  Orders Placed This Encounter  Procedures   EKG 12-Lead   Disposition:   F/U on 03/08/24 with Carlean Jews, PA-C and echo that day.   Signed, Verne Carrow, MD, [facility] 02/20/2024 11:53 AM    [facility] [address] [place], [place], [place]  [address] Phone: [phone]; Fax: [phone]

## 2024-02-20 NOTE — Patient Instructions (Signed)
Medication Instructions:  No changes *If you need a refill on your cardiac medications before your next appointment, please call your pharmacy*   Lab Work: none If you have labs (blood work) drawn today and your tests are completely normal, you will receive your results only by: Marland Kitchen MyChart Message (if you have MyChart) OR . A paper copy in the mail If you have any lab test that is abnormal or we need to change your treatment, we will call you to review the results.   Testing/Procedures: none   Follow-Up: As planned   Other Instructions none

## 2024-02-24 ENCOUNTER — Encounter (HOSPITAL_COMMUNITY): Payer: Self-pay | Admitting: Surgery

## 2024-03-07 NOTE — Progress Notes (Unsigned)
 [facility]   MULTIDISCIPLINARY HEART VALVE CLINIC                                     Cardiology Office Note:    Date:  03/09/2024   ID:  Anthony Noble., DOB December 21, 1940, MRN [id number]  PCP:  Dettinger, Elige Radon, MD  [facility] Cardiologist:  Reatha Harps, MD  [facility] Structural heart: Verne Carrow, MD [facility] Electrophysiologist:  None   Referring MD: Dettinger, Elige Radon, MD   1 month s/p TAVR  History of Present Illness:    Anthony Noble. is a 83 y.o. male with a hx of bladder cancer, remote pleural effusion s/p remote VATs (2001) c/b Guillain Barre, HLD, carotid artery disease, CAD s/p PCI/DES to pRCA (12/25/23), AAA and severe aortic stenosis s/p TAVR (02/10/24) who present to clinic for follow up.  He was found to have severe aortic stenosis by echo in June 2024. Cardiac cath January 2025 with severe RCA stenosis treated with a drug eluting stent. CTA with 3 cm infrarenal saccular aneurysm with associated penetrating ulcer. He was seen in [facility] by Dr. Chestine Spore and cleared to proceed with TAVR with likely future aortic repair. He underwent TAVR on 02/10/24 with placement of a 26 mm Edwards Sapien 3 Ultra Resilia THV from the transfemoral approach with Sentinel cerebral protection in place. He did well following his TAVR. Elevated LV pressures during TAVR and treated with IV Lasix post procedure. Echo on 02/11/24 with LVEF=70-75%. Possible small VSD. Normally functioning AVR with no PVL. Echo reviewed with imaging team and his primary cardiologist Dr. Flora Lipps and we elected to follow this for now. He was discharged home on ASA and Plavix.   Today the patient presents to clinic for follow up. Here with his wife. Doing great. Back playing golf M,W, F. No CP or SOB. No LE edema, orthopnea or PND. No dizziness or syncope. No blood in stool or urine. No palpitations. Has had an itchy diffuse rash on legs and back. Thinks it was related to the contrast  dye but has has recently worsened. Has been taking up to 6 benadryls a day.    Past Medical History:  Diagnosis Date   Anemia    Bilateral carotid artery stenosis without cerebral infarction    vascular--- dr Chestine Spore;   hx CVA;  last duplex  in epic 02-11-2023 left ICA 1-39%,  righrt ICA 40-59%,  bilateral ECA >50%   , pt to maintain on ASA 81mg  and statin   BPH (benign prostatic hypertrophy) with urinary obstruction    urologist--- dr Annabell Howells   CAD S/P percutaneous coronary angioplasty 12/25/2023   s/p PTCA/DES x 1 proximal RCA   Coronary artery disease    Diverticulosis of colon    Glaucoma    Guillain Barr syndrome (HCC) 2001   History of bladder cancer 2004   s/p TURBT  and BCG treatment's   History of colon polyps    History of pleural effusion 2001   s/p  VATS w/ drainage   History of vertebral fracture 2001   2001--   T12   Hyperlipidemia    Hypothyroidism    followed by pcp   S/P TAVR (transcatheter aortic valve replacement) 02/10/2024   s/p TAVR with a 26 mm Edwards S3UR via the TF approach using Sentinel Cerbral Embolic Protection by Dr. Clifton James & Dr. Laneta Simmers  Severe aortic stenosis    Tinnitus    Varicose veins of both lower extremities      Current Medications: Current Meds  Medication Sig   acetaminophen (TYLENOL) 650 MG CR tablet Take 650 mg by mouth every 8 (eight) hours as needed for pain.   atorvastatin (LIPITOR) 80 MG tablet Take 1 tablet (80 mg total) by mouth daily.   BAYER ASPIRIN PO Take 81 mg by mouth daily.   brimonidine (ALPHAGAN) 0.2 % ophthalmic solution Place 1 drop into the right eye 3 (three) times daily.   calcium elemental as carbonate (TUMS ULTRA 1000) 400 MG chewable tablet Chew 1,000 mg by mouth at bedtime.   cholecalciferol (VITAMIN D3) 25 MCG (1000 UNIT) tablet Take 1,000 Units by mouth daily.   clopidogrel (PLAVIX) 75 MG tablet Take 1 tablet (75 mg total) by mouth daily.   Coenzyme Q10 (COQ10) 400 MG CAPS Take 400 mg by mouth daily.    CRANBERRY PO Take 650 mg by mouth daily.   diphenhydrAMINE (BENADRYL) 25 MG tablet Take 25 mg by mouth every 6 (six) hours as needed for itching.   dorzolamide-timolol (COSOPT) 22.3-6.8 MG/ML ophthalmic solution Place 1 drop into the right eye 2 (two) times daily.   ferrous sulfate 325 (65 FE) MG tablet Take 325 mg by mouth daily.   hyoscyamine (LEVSIN SL) 0.125 MG SL tablet DISSOLVE 1 TABLET IN MOUTH EVERY 8 HOURS AS NEEDED   levothyroxine (SYNTHROID) 137 MCG tablet Take 1 tablet (137 mcg total) by mouth daily before breakfast.   loratadine (CLARITIN) 10 MG tablet Take 10 mg by mouth daily.   Magnesium 400 MG TABS Take 400 mg by mouth daily.   Multiple Vitamin (MULTIVITAMIN WITH MINERALS) TABS tablet Take 1 tablet by mouth daily. Centrum silver 50+   Multiple Vitamins-Minerals (HAIR SKIN NAILS PO) Take 1 tablet by mouth daily.   Netarsudil-Latanoprost (ROCKLATAN) 0.02-0.005 % SOLN Place 1 drop into both eyes at bedtime.   NON FORMULARY Take 15 mg by mouth at bedtime. CBD Gummy hemp isolate   NON FORMULARY Take 3 tablets by mouth 3 (three) times daily as needed (ear ring). Flavonoid   Omega-3 Fatty Acids (FISH OIL) 1000 MG CAPS Take 1,000 mg by mouth daily.   predniSONE (STERAPRED UNI-PAK 21 TAB) 10 MG (21) TBPK tablet Take 6 tablets (60 mg total) by mouth daily for 1 day, THEN 5 tablets (50 mg total) daily for 1 day, THEN 4 tablets (40 mg total) daily for 1 day, THEN 3 tablets (30 mg total) daily for 1 day, THEN 2 tablets (20 mg total) daily for 1 day, THEN 1 tablet (10 mg total) daily for 1 day.   sodium chloride (OCEAN) 0.65 % SOLN nasal spray Place 1 spray into both nostrils daily. May continue to use throughout the day as needed for congestion   triamcinolone (KENALOG) 0.025 % ointment Apply 1 Application topically 2 (two) times daily.      ROS:   Please see the history of present illness.    All other systems reviewed and are negative.  EKGs       Risk Assessment/Calculations:            Physical Exam:    VS:  BP 130/68   Pulse (!) 59   Ht 5\' 8"  (1.727 m)   Wt 181 lb 12.8 oz (82.5 kg)   SpO2 95%   BMI 27.64 kg/m     Wt Readings from Last 3 Encounters:  03/08/24 181 lb  12.8 oz (82.5 kg)  02/20/24 180 lb (81.6 kg)  02/11/24 175 lb (79.4 kg)     GEN: Well nourished, well developed in no acute distress NECK: No JVD CARDIAC: RRR, no murmurs, rubs, gallops RESPIRATORY:  Clear to auscultation without rales, wheezing or rhonchi  ABDOMEN: Soft, non-tender, non-distended EXTREMITIES:  No edema; No deformity.    ASSESSMENT:    1. S/P TAVR (transcatheter aortic valve replacement)   2. VSD (ventricular septal defect)   3. Heart failure with preserved ejection fraction, unspecified HF chronicity (HCC)   4. CAD S/P percutaneous coronary angioplasty   5. Abdominal aortic aneurysm (AAA) without rupture, unspecified part (HCC)     PLAN:    In order of problems listed above:  Severe AS s/p TAVR:  -- Echo today shows EF 70%, normally functioning TAVR with a mean gradient of 9.4 mm hg and no PVL as well as a small basal septal VSD as previously noted. This has been discussed with his cardiologist, Dr. Flora Lipps and we have elected to follow this for now.  -- NYHA class I symptoms, back playing golf with no limitation.   -- Continue on DAPT with Aspirin 81 mg daily and Plavix 75mg  daily.Marland Kitchen  -- SBE prophylaxis discussed; he has amoxicillin.  -- I will see back for 1 year office visit with echo.  VSD: -- Reviewed with Dr. Flora Lipps who feels it is restrictive and nothing that needs to be intervened upon.    HFpEF: -- Appears euvolemic. -- Not on diuretics.   CAD: -- S/p PCI/DES to pRCA (12/25/23).  -- Plan DAPT with Aspirin 81 mg daily and Plavix 75mg  daily x 6 month followed by aspirin 81mg  alone.  -- Stop Plavix on 06/23/2024. -- Continue Atorvastatin 80 mg daily.    AAA: -- Pre TAVR CT showed a 3 cm infrarenal saccular aneurysm with associated penetrating  ulcer.  -- Seen by Dr. Chestine Spore with [facility] who felt he would require elective AAA repair given the saccular morphology.   -- Follow up apt with Dr. Chestine Spore 03/09/24. -- Discussed case with Dr. Flora Lipps and Dr. Clifton James who feel he is cleared from a cardiac standpoint for aneurysm repair at least 6 weeks out from TAVR (after 03/23/24) if it can be done while on dual antiplatelet therapy. If not, can wait until he comes off Plavix in July.    Carotid artery disease: -- Carotid duplex in 09/2023 showed 40-59% RICA stenosis and 1-39% LICA stenosis. There was >50% L/R ECA stenosis. -- Followed by Dr. Chestine Spore.    Bladder cancer: -- Followed by Dr. Annabell Howells  Rash: -- He thinks was related to iodinated contrast, although his contrast exposures was several weeks back.   -- This has as been very itchy and taking up to 6 benadryls a day.  -- I sent in a prednisone taper and steroid ointment.        Medication Adjustments/Labs and Tests Ordered: Current medicines are reviewed at length with the patient today.  Concerns regarding medicines are outlined above.  No orders of the defined types were placed in this encounter.  Meds ordered this encounter  Medications   predniSONE (STERAPRED UNI-PAK 21 TAB) 10 MG (21) TBPK tablet    Sig: Take 6 tablets (60 mg total) by mouth daily for 1 day, THEN 5 tablets (50 mg total) daily for 1 day, THEN 4 tablets (40 mg total) daily for 1 day, THEN 3 tablets (30 mg total) daily for 1 day, THEN 2 tablets (20  mg total) daily for 1 day, THEN 1 tablet (10 mg total) daily for 1 day.    Dispense:  21 tablet    Refill:  0    Supervising Provider:   Excell Seltzer, MICHAEL [[id number]]   triamcinolone (KENALOG) 0.025 % ointment    Sig: Apply 1 Application topically 2 (two) times daily.    Dispense:  30 g    Refill:  0    Supervising Provider:   Tonny Bollman [[id number]]    Patient Instructions  Medication Instructions:  Your physician has recommended you make the following change in your  medication: 1. STOP PLAVIX ON 06/23/24  *If you need a refill on your cardiac medications before your next appointment, please call your pharmacy*  Lab Work: NONE If you have labs (blood work) drawn today and your tests are completely normal, you will receive your results only by: MyChart Message (if you have MyChart) OR A paper copy in the mail If you have any lab test that is abnormal or we need to change your treatment, we will call you to review the results.  Testing/Procedures: NONE  Follow-Up: At [facility], you and your health needs are our priority.  As part of our continuing mission to provide you with exceptional heart care, our providers are all part of one team.  This team includes your primary Cardiologist (physician) and Advanced Practice Providers or APPs (Physician Assistants and Nurse Practitioners) who all work together to provide you with the care you need, when you need it.  Your next appointment:   KEEP SCHEDULED FOLLOW-UP   We recommend signing up for the patient portal called "MyChart".  Sign up information is provided on this After Visit Summary.  MyChart is used to connect with patients for Virtual Visits (Telemedicine).  Patients are able to view lab/test results, encounter notes, upcoming appointments, etc.  Non-urgent messages can be sent to your provider as well.   To learn more about what you can do with MyChart, go to [email or web address].         1st Floor: - Lobby - Registration  - Pharmacy  - Lab - Cafe  2nd Floor: - PV Lab - Diagnostic Testing (echo, CT, nuclear med)  3rd Floor: - Vacant  4th Floor: - TCTS (cardiothoracic surgery) - AFib Clinic - Structural Heart Clinic - Vascular Surgery  - Vascular Ultrasound  5th Floor: - HeartCare Cardiology (general and EP) - Clinical Pharmacy for coumadin, hypertension, lipid, weight-loss medications, and med management appointments    Valet parking services will be available  as well.      Signed, Cline Crock, PA-C  03/09/2024 8:21 AM    [facility]

## 2024-03-08 ENCOUNTER — Ambulatory Visit (INDEPENDENT_AMBULATORY_CARE_PROVIDER_SITE_OTHER): Admitting: Physician Assistant

## 2024-03-08 ENCOUNTER — Ambulatory Visit: Attending: Cardiology

## 2024-03-08 VITALS — BP 130/68 | HR 59 | Ht 68.0 in | Wt 181.8 lb

## 2024-03-08 DIAGNOSIS — I714 Abdominal aortic aneurysm, without rupture, unspecified: Secondary | ICD-10-CM | POA: Diagnosis not present

## 2024-03-08 DIAGNOSIS — I2510 Atherosclerotic heart disease of native coronary artery without angina pectoris: Secondary | ICD-10-CM | POA: Insufficient documentation

## 2024-03-08 DIAGNOSIS — Z952 Presence of prosthetic heart valve: Secondary | ICD-10-CM

## 2024-03-08 DIAGNOSIS — Q210 Ventricular septal defect: Secondary | ICD-10-CM | POA: Diagnosis not present

## 2024-03-08 DIAGNOSIS — I5030 Unspecified diastolic (congestive) heart failure: Secondary | ICD-10-CM | POA: Insufficient documentation

## 2024-03-08 DIAGNOSIS — Z9861 Coronary angioplasty status: Secondary | ICD-10-CM | POA: Insufficient documentation

## 2024-03-08 LAB — ECHOCARDIOGRAM COMPLETE
AR max vel: 1.48 cm2
AV Area VTI: 1.69 cm2
AV Area mean vel: 1.48 cm2
AV Mean grad: 9.4 mmHg
AV Peak grad: 17.1 mmHg
Ao pk vel: 2.07 m/s
Area-P 1/2: 2.27 cm2
S' Lateral: 2.6 cm

## 2024-03-08 MED ORDER — PREDNISONE 10 MG (21) PO TBPK: ORAL_TABLET | ORAL | 0 refills | Status: AC

## 2024-03-08 MED ORDER — TRIAMCINOLONE ACETONIDE 0.025 % EX OINT: 1.0000 | TOPICAL_OINTMENT | Freq: Two times a day (BID) | CUTANEOUS | 0 refills | Status: DC

## 2024-03-08 NOTE — Patient Instructions (Signed)
 Medication Instructions:  Your physician has recommended you make the following change in your medication: 1. STOP PLAVIX ON 06/23/24  *If you need a refill on your cardiac medications before your next appointment, please call your pharmacy*  Lab Work: NONE If you have labs (blood work) drawn today and your tests are completely normal, you will receive your results only by: MyChart Message (if you have MyChart) OR A paper copy in the mail If you have any lab test that is abnormal or we need to change your treatment, we will call you to review the results.  Testing/Procedures: NONE  Follow-Up: At [facility], you and your health needs are our priority.  As part of our continuing mission to provide you with exceptional heart care, our providers are all part of one team.  This team includes your primary Cardiologist (physician) and Advanced Practice Providers or APPs (Physician Assistants and Nurse Practitioners) who all work together to provide you with the care you need, when you need it.  Your next appointment:   KEEP SCHEDULED FOLLOW-UP   We recommend signing up for the patient portal called "MyChart".  Sign up information is provided on this After Visit Summary.  MyChart is used to connect with patients for Virtual Visits (Telemedicine).  Patients are able to view lab/test results, encounter notes, upcoming appointments, etc.  Non-urgent messages can be sent to your provider as well.   To learn more about what you can do with MyChart, go to [email or web address].         1st Floor: - Lobby - Registration  - Pharmacy  - Lab - Cafe  2nd Floor: - PV Lab - Diagnostic Testing (echo, CT, nuclear med)  3rd Floor: - Vacant  4th Floor: - TCTS (cardiothoracic surgery) - AFib Clinic - Structural Heart Clinic - Vascular Surgery  - Vascular Ultrasound  5th Floor: - HeartCare Cardiology (general and EP) - Clinical Pharmacy for coumadin, hypertension, lipid,  weight-loss medications, and med management appointments    Valet parking services will be available as well.

## 2024-03-09 ENCOUNTER — Encounter: Payer: Self-pay | Admitting: Vascular Surgery

## 2024-03-09 ENCOUNTER — Ambulatory Visit: Payer: PPO | Admitting: Vascular Surgery

## 2024-03-09 VITALS — BP 183/91 | HR 74 | Temp 97.8°F | Resp 20 | Ht 68.0 in | Wt 179.6 lb

## 2024-03-09 DIAGNOSIS — I7143 Infrarenal abdominal aortic aneurysm, without rupture: Secondary | ICD-10-CM | POA: Diagnosis not present

## 2024-03-09 NOTE — Progress Notes (Signed)
 Patient name: Anthony Noble [facility]. MRN: [id number] DOB: 1941-04-07 Sex: male  REASON FOR CONSULT: 2 month follow-up, 3 cm saccular infra-renal AAA  HPI: Anthony Noble. is a 83 y.o. male, with history of carotid stenosis, CAD, severe aortic stenosis s/p TAVR, bladder cancer, Guillain-Barr that presents for 2 month follow-up of a 3 cm saccular infrarenal abdominal aortic aneurysm.    On evaluation for TAVR found to have a 3 cm infra-renal saccular aneurysm from PAU on CTA on 01/02/2024.    He has since undergone TAVR successfully on 02/10/2024.  He has been cleared to undergo aneurysm repair 6 weeks after TAVR after 03/23/2024.  No new complaints today.  Past Medical History:  Diagnosis Date   Anemia    Bilateral carotid artery stenosis without cerebral infarction    vascular--- dr Chestine Spore;   hx CVA;  last duplex  in epic 02-11-2023 left ICA 1-39%,  righrt ICA 40-59%,  bilateral ECA >50%   , pt to maintain on ASA 81mg  and statin   BPH (benign prostatic hypertrophy) with urinary obstruction    urologist--- dr Annabell Howells   CAD S/P percutaneous coronary angioplasty 12/25/2023   s/p PTCA/DES x 1 proximal RCA   Coronary artery disease    Diverticulosis of colon    Glaucoma    Guillain Barr syndrome (HCC) 2001   History of bladder cancer 2004   s/p TURBT  and BCG treatment's   History of colon polyps    History of pleural effusion 2001   s/p  VATS w/ drainage   History of vertebral fracture 2001   2001--   T12   Hyperlipidemia    Hypothyroidism    followed by pcp   S/P TAVR (transcatheter aortic valve replacement) 02/10/2024   s/p TAVR with a 26 mm Edwards S3UR via the TF approach using Sentinel Cerbral Embolic Protection by Dr. Clifton James & Dr. Laneta Simmers   Severe aortic stenosis    Tinnitus    Varicose veins of both lower extremities     Past Surgical History:  Procedure Laterality Date   CORONARY STENT INTERVENTION N/A 12/25/2023   Procedure: CORONARY STENT INTERVENTION;   Surgeon: Kathleene Hazel, MD;  Location: [facility];  Service: Cardiovascular;  Laterality: N/A;   CYSTO/  BILATERAL RETROGRADE PYLOGRAM/  RESECTION BLADDER TUMOR  07/30/2011   @[facility]   by dr Annabell Howells   CYSTOSCOPY WITH BIOPSY N/A 01/12/2015   Procedure: CYSTOSCOPY WITH BLADDER  BIOPSY, FULGERATION;  Surgeon: Anner Crete, MD;  Location: [facility];  Service: Urology;  Laterality: N/A;   CYSTOSCOPY WITH BIOPSY Bilateral 05/13/2023   Procedure: CYSTOSCOPY,BLADDER  BIOPSY WITH FULGURATION, BILATERAL RETROGRADE PYELOGRAM;  Surgeon: Bjorn Pippin, MD;  Location: [facility];  Service: Urology;  Laterality: Bilateral;  1 HR FOR CASE   INTRAOPERATIVE TRANSTHORACIC ECHOCARDIOGRAM N/A 02/10/2024   Procedure: ECHOCARDIOGRAM, TRANSTHORACIC;  Surgeon: Kathleene Hazel, MD;  Location: [facility];  Service: Cardiovascular;  Laterality: N/A;   ORIF RIGHT DISTAL RADIUS FX  12/31/2007   @[facility]   by dr c. blackman   RIGHT HEART CATH AND CORONARY ANGIOGRAPHY N/A 12/25/2023   Procedure: RIGHT HEART CATH AND CORONARY ANGIOGRAPHY;  Surgeon: Kathleene Hazel, MD;  Location: [facility];  Service: Cardiovascular;  Laterality: N/A;   TONSILLECTOMY     child   TRANSURETHRAL RESECTION OF BLADDER TUMOR WITH MITOMYCIN-C  06/07/2003   @[facility]  by dr Annabell Howells   VIDEO ASSISTED THORACOSCOPY (VATS)/EMPYEMA Right 05/01/2000   @  [facility]  by dr hendrickson;  and Drainage of effusion    Family History  Problem Relation Age of Onset   Other Mother        varicose veins   Cancer Mother        Ovarian   Varicose Veins Mother    Nephrolithiasis Mother    COPD Father    Hyperlipidemia Father    Cancer Maternal Grandmother    Bipolar disorder Daughter    Healthy Daughter    Hypertension Sister    Healthy Daughter    Colon cancer Neg Hx     SOCIAL HISTORY: Social History   Socioeconomic History   Marital status: Married    Spouse name: Not on file   Number of children: 2   Years of  education: Not on file   Highest education level: Not on file  Occupational History   Occupation: retired    Comment: self employed-printing  Tobacco Use   Smoking status: Former    Current packs/day: 0.00    Average packs/day: 1 pack/day for 20.0 years (20.0 ttl pk-yrs)    Types: Cigarettes    Start date: 1963    Quit date: 1983    Years since quitting: 42.2    Passive exposure: Never   Smokeless tobacco: Never  Vaping Use   Vaping status: Never Used  Substance and Sexual Activity   Alcohol use: Yes    Alcohol/week: 2.0 standard drinks of alcohol    Types: 1 Shots of liquor, 1 Standard drinks or equivalent per week   Drug use: No    Comment: Hemp gummies   Sexual activity: Not on file  Other Topics Concern   Not on file  Social History Narrative   Lives at home with wife and daughter.      Social Drivers of Corporate investment banker Strain: Low Risk  (05/02/2020)   Overall Financial Resource Strain (CARDIA)    Difficulty of Paying Living Expenses: Not hard at all  Food Insecurity: No Food Insecurity (02/10/2024)   Hunger Vital Sign    Worried About Running Out of Food in the Last Year: Never true    Ran Out of Food in the Last Year: Never true  Transportation Needs: No Transportation Needs (02/10/2024)   PRAPARE - Administrator, Civil Service (Medical): No    Lack of Transportation (Non-Medical): No  Physical Activity: Sufficiently Active (05/02/2020)   Exercise Vital Sign    Days of Exercise per Week: 5 days    Minutes of Exercise per Session: 60 min  Stress: No Stress Concern Present (05/02/2020)   [name] of Occupational Health - Occupational Stress Questionnaire    Feeling of Stress : Not at all  Social Connections: Socially Integrated (02/10/2024)   Social Connection and Isolation Panel [NHANES]    Frequency of Communication with Friends and Family: More than three times a week    Frequency of Social Gatherings with Friends and Family: More  than three times a week    Attends Religious Services: More than 4 times per year    Active Member of [employer] or Organizations: Yes    Attends Banker Meetings: More than 4 times per year    Marital Status: Married  Catering manager Violence: Not At Risk (02/10/2024)   Humiliation, Afraid, Rape, and Kick questionnaire    Fear of Current or Ex-Partner: No    Emotionally Abused: No    Physically Abused: No  Sexually Abused: No    Allergies  Allergen Reactions   Bcg Live Other (See Comments)    Treatment pt had- shakes   Septra [Bactrim] Other (See Comments)    Muscle weakness, numbness   Sulfa Antibiotics Other (See Comments)    Severe weakness   Pertussis Vaccines     History of Gillian barrie    Iodinated Contrast Media Rash    Current Outpatient Medications  Medication Sig Dispense Refill   acetaminophen (TYLENOL) 650 MG CR tablet Take 650 mg by mouth every 8 (eight) hours as needed for pain.     amoxicillin-clavulanate (AUGMENTIN) 875-125 MG tablet Take 1 tablet by mouth 2 (two) times daily. (Patient not taking: Reported on 03/08/2024) 14 tablet 0   atorvastatin (LIPITOR) 80 MG tablet Take 1 tablet (80 mg total) by mouth daily. 90 tablet 1   BAYER ASPIRIN PO Take 81 mg by mouth daily.     benzonatate (TESSALON) 100 MG capsule Take 1 capsule (100 mg total) by mouth 3 (three) times daily as needed. (Patient not taking: Reported on 03/08/2024) 30 capsule 0   brimonidine (ALPHAGAN) 0.2 % ophthalmic solution Place 1 drop into the right eye 3 (three) times daily.     calcium elemental as carbonate (TUMS ULTRA 1000) 400 MG chewable tablet Chew 1,000 mg by mouth at bedtime.     cholecalciferol (VITAMIN D3) 25 MCG (1000 UNIT) tablet Take 1,000 Units by mouth daily.     clopidogrel (PLAVIX) 75 MG tablet Take 1 tablet (75 mg total) by mouth daily. 90 tablet 1   Coenzyme Q10 (COQ10) 400 MG CAPS Take 400 mg by mouth daily.     CRANBERRY PO Take 650 mg by mouth daily.      diphenhydrAMINE (BENADRYL) 25 MG tablet Take 25 mg by mouth every 6 (six) hours as needed for itching.     dorzolamide-timolol (COSOPT) 22.3-6.8 MG/ML ophthalmic solution Place 1 drop into the right eye 2 (two) times daily.     ferrous sulfate 325 (65 FE) MG tablet Take 325 mg by mouth daily.     hyoscyamine (LEVSIN SL) 0.125 MG SL tablet DISSOLVE 1 TABLET IN MOUTH EVERY 8 HOURS AS NEEDED 120 tablet 2   levothyroxine (SYNTHROID) 137 MCG tablet Take 1 tablet (137 mcg total) by mouth daily before breakfast. 90 tablet 1   loratadine (CLARITIN) 10 MG tablet Take 10 mg by mouth daily.     Magnesium 400 MG TABS Take 400 mg by mouth daily.     Multiple Vitamin (MULTIVITAMIN WITH MINERALS) TABS tablet Take 1 tablet by mouth daily. Centrum silver 50+     Multiple Vitamins-Minerals (HAIR SKIN NAILS PO) Take 1 tablet by mouth daily.     Netarsudil-Latanoprost (ROCKLATAN) 0.02-0.005 % SOLN Place 1 drop into both eyes at bedtime.     NON FORMULARY Take 15 mg by mouth at bedtime. CBD Gummy hemp isolate     NON FORMULARY Take 3 tablets by mouth 3 (three) times daily as needed (ear ring). Flavonoid     Omega-3 Fatty Acids (FISH OIL) 1000 MG CAPS Take 1,000 mg by mouth daily.     predniSONE (STERAPRED UNI-PAK 21 TAB) 10 MG (21) TBPK tablet Take 6 tablets (60 mg total) by mouth daily for 1 day, THEN 5 tablets (50 mg total) daily for 1 day, THEN 4 tablets (40 mg total) daily for 1 day, THEN 3 tablets (30 mg total) daily for 1 day, THEN 2 tablets (20 mg total) daily for  1 day, THEN 1 tablet (10 mg total) daily for 1 day. 21 tablet 0   sodium chloride (OCEAN) 0.65 % SOLN nasal spray Place 1 spray into both nostrils daily. May continue to use throughout the day as needed for congestion     triamcinolone (KENALOG) 0.025 % ointment Apply 1 Application topically 2 (two) times daily. 30 g 0   No current facility-administered medications for this visit.    REVIEW OF SYSTEMS:  [X]  denotes positive finding, [ ]  denotes  negative finding Cardiac  Comments:  Chest pain or chest pressure:    Shortness of breath upon exertion:    Short of breath when lying flat:    Irregular heart rhythm:        Vascular    Pain in calf, thigh, or hip brought on by ambulation:    Pain in feet at night that wakes you up from your sleep:     Blood clot in your veins:    Leg swelling:         Pulmonary    Oxygen at home:    Productive cough:     Wheezing:         Neurologic    Sudden weakness in arms or legs:     Sudden numbness in arms or legs:     Sudden onset of difficulty speaking or slurred speech:    Temporary loss of vision in one eye:     Problems with dizziness:         Gastrointestinal    Blood in stool:     Vomited blood:         Genitourinary    Burning when urinating:     Blood in urine:        Psychiatric    Major depression:         Hematologic    Bleeding problems:    Problems with blood clotting too easily:        Skin    Rashes or ulcers:        Constitutional    Fever or chills:      PHYSICAL EXAM: There were no vitals filed for this visit.   GENERAL: The patient is a well-nourished male, in no acute distress. The vital signs are documented above. CARDIAC: There is a regular rate and rhythm.  VASCULAR:  Bilateral femoral pulses palpable Bilateral PT pulses palpable PULMONARY: No respiratory distress. ABDOMEN: Soft and non-tender. MUSCULOSKELETAL: There are no major deformities or cyanosis. NEUROLOGIC: No focal weakness or paresthesias are detected. SKIN: There are no ulcers or rashes noted. PSYCHIATRIC: The patient has a normal affect.  DATA:   CTA reviewed from 01/02/2024 with 3 cm saccular infrarenal abdominal aortic aneurysm from penetrating aortic ulcer  Assessment/Plan:  83 y.o. male, with history of carotid stenosis, CAD, severe aortic stenosis s/p TAVR, bladder cancer, Guillain-Barr that presents for 2 month follow-up of a 3 cm saccular infrarenal abdominal  aortic aneurysm.    On evaluation for TAVR found to have a 3 cm infra-renal saccular aneurysm from PAU on CTA on 01/02/2024.    He has since undergone TAVR successfully on 02/10/2024.He has been cleared to undergo aneurysm repair 6 weeks after TAVR after 03/23/2024. Again discussed my recommendation to repair his aneurysm with stent graft given saccular morphology. His native aorta is relatively small and only measures about 13 mm distal to the aneurysm.  I think we can repair his infrarenal aneurysm with likely two 20 mm aortic cuffs  from a transfemoral approach.  He is leaning toward surgery in early May and I will have my office contact to schedule.  He does need to stay on aspirin Plavix through surgery which I discussed is okay given previous PCI and needs to be on dual antiplatelet therapy until July.   Cephus Shelling, MD [facility] Office: [phone]

## 2024-03-10 ENCOUNTER — Telehealth: Payer: Self-pay

## 2024-03-10 NOTE — Telephone Encounter (Signed)
 Attempted to call for surgery scheduling. LVM

## 2024-03-11 ENCOUNTER — Telehealth: Payer: Self-pay

## 2024-03-11 NOTE — Telephone Encounter (Signed)
 Called pt to schedule his surgery (EVAR); spoke to his wife. She states pt will call us back. He prefers to schedule for sometime in May but will give [place] a call to schedule.

## 2024-03-12 ENCOUNTER — Telehealth: Payer: Self-pay

## 2024-03-12 NOTE — Telephone Encounter (Signed)
 Patient's wife, Stark Bray, called asking questions re: patient's suggested EVAR procedure.  This nurse made suggestions re: activity, lifting and possible timing of procedure.  Also provided website to [email or web address] to share info re: device used. Patient's wife was thankful for information and will call back for scheduling after speaking to patient.

## 2024-03-15 ENCOUNTER — Telehealth: Payer: Self-pay

## 2024-03-15 NOTE — Telephone Encounter (Signed)
 Patient and wife, Anthony Noble, had more questions in re: to surgical procedure. Patient is still deciding upon surgery and will call back.

## 2024-03-16 ENCOUNTER — Other Ambulatory Visit: Payer: Self-pay

## 2024-03-16 DIAGNOSIS — I7143 Infrarenal abdominal aortic aneurysm, without rupture: Secondary | ICD-10-CM

## 2024-03-19 ENCOUNTER — Ambulatory Visit: Payer: Self-pay

## 2024-03-19 ENCOUNTER — Ambulatory Visit: Admitting: Nurse Practitioner

## 2024-03-19 ENCOUNTER — Encounter: Payer: Self-pay | Admitting: Nurse Practitioner

## 2024-03-19 VITALS — BP 122/68 | HR 75 | Temp 98.0°F | Ht 68.0 in | Wt 183.0 lb

## 2024-03-19 DIAGNOSIS — L240 Irritant contact dermatitis due to detergents: Secondary | ICD-10-CM

## 2024-03-19 MED ORDER — TRIAMCINOLONE ACETONIDE 0.1 % EX CREA: 1.0000 | TOPICAL_CREAM | Freq: Two times a day (BID) | CUTANEOUS | 1 refills | Status: DC

## 2024-03-19 MED ORDER — METHYLPREDNISOLONE ACETATE 80 MG/ML IJ SUSP
80.0000 mg | Freq: Once | INTRAMUSCULAR | Status: AC
  Administered 2024-03-19: 80 mg via INTRAMUSCULAR

## 2024-03-19 NOTE — Progress Notes (Signed)
 Subjective:    Patient ID: Anthony Genta Daisy Blossom., male    DOB: Jan 18, 1941, 83 y.o.   MRN: [id number]   Chief Complaint: rash  HPI  Has a rash that started after having heart valve replacement. They think it is coming from the dye they used. He has been treated with prednisone. That helps but still seems to come back. He took last steroid pack last week. Then this past Sunday the rash returned. The rash is for ankles and up both legs, trunk and shoulder blades. Rash doe snot lok the same every where. He has been using a new detergent , but otherwise nothing else new.  Patient Active Problem List   Diagnosis Date Noted   S/P TAVR (transcatheter aortic valve replacement) 02/10/2024   AAA (abdominal aortic aneurysm) without rupture (HCC) 01/13/2024   CAD S/P percutaneous coronary angioplasty 12/25/2023   Severe aortic stenosis    Bilateral carotid artery stenosis without cerebral infarction    Low tension glaucoma of right eye, severe stage 12/20/2021   Low-tension glaucoma of left eye, mild stage 12/20/2021   Senile purpura (HCC) 04/29/2018   Malignant neoplasm of urinary bladder (HCC) 04/06/2014   History of Guillain-Barre syndrome 04/06/2014   History of colonic polyps 04/06/2014   BPH (benign prostatic hyperplasia) 04/06/2014   Erectile dysfunction 04/06/2014   Hyperlipidemia 04/06/2014   Hypothyroidism 03/30/2013   BCG vaccine causing adverse effect in therapeutic use 02/06/2012   History of pleural effusion 2001       Review of Systems  Constitutional:  Negative for diaphoresis.  Eyes:  Negative for pain.  Respiratory:  Negative for shortness of breath.   Cardiovascular:  Negative for chest pain, palpitations and leg swelling.  Gastrointestinal:  Negative for abdominal pain.  Endocrine: Negative for polydipsia.  Skin:  Negative for rash.  Neurological:  Negative for dizziness, weakness and headaches.  Hematological:  Does not bruise/bleed easily.  All other systems  reviewed and are negative.      Objective:   Physical Exam Constitutional:      Appearance: Normal appearance.  Cardiovascular:     Rate and Rhythm: Normal rate and regular rhythm.     Heart sounds: Normal heart sounds.  Pulmonary:     Breath sounds: Normal breath sounds.  Skin:    General: Skin is warm.     Findings: Rash (rasied rough  erythematous rash on lower ext , trunk and arms.) present.  Neurological:     General: No focal deficit present.     Mental Status: He is alert.  Psychiatric:        Mood and Affect: Mood normal.        Behavior: Behavior normal.     BP 122/68   Pulse 75   Temp 98 F (36.7 C) (Temporal)   Ht 5\' 8"  (1.727 m)   Wt 183 lb (83 kg)   SpO2 98%   BMI 27.83 kg/m        Assessment & Plan:  [name] in today with chief complaint of Rash   1. Irritant contact dermatitis due to detergent (Primary) Change back to old detergent Avoid scratching Continue claritin RTO prn   Meds ordered this encounter  Medications   methylPREDNISolone acetate (DEPO-MEDROL) injection 80 mg   triamcinolone cream (KENALOG) 0.1 %    Sig: Apply 1 Application topically 2 (two) times daily.    Dispense:  453.6 g    Refill:  1    Supervising Provider:  DETTINGER, JOSHUA A [[id number]]          The above assessment and management plan was discussed with the patient. The patient verbalized understanding of and has agreed to the management plan. Patient is aware to call the clinic if symptoms persist or worsen. Patient is aware when to return to the clinic for a follow-up visit. Patient educated on when it is appropriate to go to the emergency department.   Mary-Margaret Daphine Deutscher, FNP

## 2024-03-19 NOTE — Telephone Encounter (Signed)
 Copied from CRM [id number]. Topic: Clinical - Red Word Triage >> Mar 19, 2024  8:05 AM Geroge Baseman wrote: Red Word that prompted transfer to Nurse Triage: Rash all over his body, onset a couple weeks, was given meds to treat, as soon as the meds were gone, rash came  back.   Chief Complaint: Rash all over, itchy. Had dye for a procedure and had prednisone. Symptoms: Above Frequency: 2-3 weeks Pertinent Negatives: Patient denies fever Disposition: [] ED /[] Urgent Care (no appt availability in office) / [x] Appointment(In office/virtual)/ []  [facility]/ [] Home Care/ [] Refused Recommended Disposition /[] [facility]/ []  Follow-up with PCP Additional Notes: Agrees with appointment.  Reason for Disposition  SEVERE itching (i.e., interferes with sleep, normal activities or school)  Answer Assessment - Initial Assessment Questions 1. APPEARANCE of RASH: "Describe the rash." (e.g., spots, blisters, raised areas, skin peeling, scaly)     Pink and red raised bumps 2. SIZE: "How big are the spots?" (e.g., tip of pen, eraser, coin; inches, centimeters)     Small 3. LOCATION: "Where is the rash located?"     All over, not face 4. COLOR: "What color is the rash?" (Note: It is difficult to assess rash color in people with darker-colored skin. When this situation occurs, simply ask the caller to describe what they see.)     Pink and red 5. ONSET: "When did the rash begin?"     3 weeks 6. FEVER: "Do you have a fever?" If Yes, ask: "What is your temperature, how was it measured, and when did it start?"     No 7. ITCHING: "Does the rash itch?" If Yes, ask: "How bad is the itch?" (Scale 1-10; or mild, moderate, severe)     Moderate 8. CAUSE: "What do you think is causing the rash?"     Unsure 9. MEDICINE FACTORS: "Have you started any new medicines within the last 2 weeks?" (e.g., antibiotics)      No 10. OTHER SYMPTOMS: "Do you have any other symptoms?" (e.g., dizziness, headache, sore  throat, joint pain)       No 11. PREGNANCY: "Is there any chance you are pregnant?" "When was your last menstrual period?"       N/a  Protocols used: Rash or Redness - [facility]

## 2024-03-19 NOTE — Patient Instructions (Signed)
 Contact Dermatitis Dermatitis is redness, soreness, and swelling (inflammation) of the skin. Contact dermatitis is a reaction to certain substances that touch the skin. There are two types of this condition: Irritant contact dermatitis. This is the most common type. It happens when something irritates your skin, such as when your hands get dry from washing them too often with soap. You can get this type of reaction even if you have not been exposed to the irritant before. Allergic contact dermatitis. This type is caused by a substance that you are allergic to, such as poison ivy. It occurs when you have been exposed to the substance (allergen) and form a sensitivity to it. In some cases, the reaction may start soon after your first exposure to the allergen. In other cases, it may not start until you are exposed to the allergen again. It may then occur every time you are exposed to the allergen in the future. What are the causes? Irritant contact dermatitis is often caused by exposure to: Makeup. Soaps, detergents, and bleaches. Acids. Metal salts, such as nickel. Allergic contact dermatitis is often caused by exposure to: Poisonous plants. Chemicals. Jewelry. Latex. Medicines. Preservatives in products, such as clothes. What increases the risk? You are more likely to get this condition if you have: A job that exposes you to irritants or allergens. Certain medical conditions. These include asthma and eczema. What are the signs or symptoms? Symptoms of this condition may occur in any place on your body that has been touched by the irritant. Symptoms include: Dryness, flaking, or cracking. Redness. Itching. Pain or a burning feeling. Blisters. Drainage of small amounts of blood or clear fluid from skin cracks. With allergic contact dermatitis, there may also be swelling in areas such as the eyelids, mouth, or genitals. How is this diagnosed? This condition is diagnosed with a medical  history and physical exam. A patch skin test may be done to help figure out the cause. If the condition is related to your job, you may need to see an expert in health problems in the workplace (occupational medicine specialist). How is this treated? This condition is treated by staying away from the cause of the reaction and protecting your skin from further contact. Treatment may also include: Steroid creams or ointments. Steroid medicines may need be taken by mouth (orally) in more severe cases. Antibiotics or medicines applied to the skin to kill bacteria (antibacterial ointments). These may be needed if a skin infection is present. Antihistamines. These may be taken orally or put on as a lotion to ease itching. A bandage (dressing). Follow these instructions at home: Skin care Moisturize your skin as needed. Put cool, wet cloths (cool compresses) on the affected areas. Try applying baking soda paste to your skin. Stir water into baking soda until it has the consistency of a paste. Do not scratch your skin. Avoid friction to the affected area. Avoid the use of soaps, perfumes, and dyes. Check the affected areas every day for signs of infection. Check for: More redness, swelling, or pain. More fluid or blood. Warmth. Pus or a bad smell. Medicines Take or apply over-the-counter and prescription medicines only as told by your health care provider. If you were prescribed antibiotics, take or apply them as told by your health care provider. Do not stop using the antibiotic even if you start to feel better. Bathing Try taking a bath with: Epsom salts. Follow the instructions on the packaging. You can get these at your local pharmacy  or grocery store. Baking soda. Pour a small amount into the bath as told by your health care provider. Colloidal oatmeal. Follow the instructions on the packaging. You can get this at your local pharmacy or grocery store. Bathe less often. This may mean bathing  every other day. Bathe in lukewarm water. Avoid using hot water. Bandage care If you were given a dressing, change it as told by your health care provider. Wash your hands with soap and water for at least 20 seconds before and after you change your dressing. If soap and water are not available, use hand sanitizer. General instructions Avoid the substance that caused your reaction. If you do not know what caused it, keep a journal to try to track what caused it. Write down: What you eat and drink. What cosmetics you use. What you wear in the affected area. This includes jewelry. Contact a health care provider if: Your condition does not get better with treatment. Your condition gets worse. You have any signs of infection. You have a fever. You have new symptoms. Your bone or joint under the affected area becomes painful after the skin has healed. Get help right away if: You notice red streaks coming from the affected area. The affected area turns darker. You have trouble breathing. This information is not intended to replace advice given to you by your health care provider. Make sure you discuss any questions you have with your health care provider. Document Revised: 05/31/2022 Document Reviewed: 05/31/2022 Elsevier Patient Education  2024 [employer].

## 2024-03-19 NOTE — Telephone Encounter (Signed)
 Appointment today. LS

## 2024-03-23 DIAGNOSIS — D692 Other nonthrombocytopenic purpura: Secondary | ICD-10-CM | POA: Diagnosis not present

## 2024-03-23 DIAGNOSIS — L308 Other specified dermatitis: Secondary | ICD-10-CM | POA: Diagnosis not present

## 2024-04-13 ENCOUNTER — Encounter (HOSPITAL_COMMUNITY): Payer: Self-pay

## 2024-04-13 ENCOUNTER — Other Ambulatory Visit: Payer: Self-pay

## 2024-04-13 ENCOUNTER — Encounter (HOSPITAL_COMMUNITY)
Admission: RE | Admit: 2024-04-13 | Discharge: 2024-04-13 | Disposition: A | Discharge status: Home / Self Care | Source: Ambulatory Visit | Attending: Vascular Surgery | Admitting: Vascular Surgery

## 2024-04-13 ENCOUNTER — Other Ambulatory Visit: Payer: Self-pay | Admitting: Family Medicine

## 2024-04-13 VITALS — BP 154/73 | Temp 97.8°F | Resp 17 | Ht 68.0 in | Wt 178.0 lb

## 2024-04-13 DIAGNOSIS — Z01812 Encounter for preprocedural laboratory examination: Secondary | ICD-10-CM | POA: Diagnosis not present

## 2024-04-13 DIAGNOSIS — Z7902 Long term (current) use of antithrombotics/antiplatelets: Secondary | ICD-10-CM | POA: Insufficient documentation

## 2024-04-13 DIAGNOSIS — I7143 Infrarenal abdominal aortic aneurysm, without rupture: Secondary | ICD-10-CM | POA: Insufficient documentation

## 2024-04-13 DIAGNOSIS — I350 Nonrheumatic aortic (valve) stenosis: Secondary | ICD-10-CM | POA: Insufficient documentation

## 2024-04-13 DIAGNOSIS — I2510 Atherosclerotic heart disease of native coronary artery without angina pectoris: Secondary | ICD-10-CM | POA: Diagnosis not present

## 2024-04-13 DIAGNOSIS — Z87891 Personal history of nicotine dependence: Secondary | ICD-10-CM | POA: Insufficient documentation

## 2024-04-13 DIAGNOSIS — Z9861 Coronary angioplasty status: Secondary | ICD-10-CM | POA: Diagnosis not present

## 2024-04-13 DIAGNOSIS — Z01818 Encounter for other preprocedural examination: Secondary | ICD-10-CM

## 2024-04-13 LAB — URINALYSIS, ROUTINE W REFLEX MICROSCOPIC
Bilirubin Urine: NEGATIVE
Glucose, UA: NEGATIVE mg/dL
Hgb urine dipstick: NEGATIVE
Ketones, ur: NEGATIVE mg/dL
Leukocytes,Ua: NEGATIVE
Nitrite: NEGATIVE
Protein, ur: NEGATIVE mg/dL
Specific Gravity, Urine: 1.012 (ref 1.005–1.030)
pH: 5 (ref 5.0–8.0)

## 2024-04-13 LAB — SURGICAL PCR SCREEN
MRSA, PCR: NEGATIVE
Staphylococcus aureus: NEGATIVE

## 2024-04-13 LAB — COMPREHENSIVE METABOLIC PANEL WITH GFR
ALT: 20 U/L (ref 0–44)
AST: 31 U/L (ref 15–41)
Albumin: 4 g/dL (ref 3.5–5.0)
Alkaline Phosphatase: 61 U/L (ref 38–126)
Anion gap: 10 (ref 5–15)
BUN: 14 mg/dL (ref 8–23)
CO2: 26 mmol/L (ref 22–32)
Calcium: 9.1 mg/dL (ref 8.9–10.3)
Chloride: 102 mmol/L (ref 98–111)
Creatinine, Ser: 1.05 mg/dL (ref 0.61–1.24)
GFR, Estimated: >60 mL/min (ref >60)
Glucose, Bld: 82 mg/dL (ref 70–99)
Potassium: 3.5 mmol/L (ref 3.5–5.1)
Sodium: 138 mmol/L (ref 135–145)
Total Bilirubin: 0.6 mg/dL (ref 0.0–1.2)
Total Protein: 7.1 g/dL (ref 6.5–8.1)

## 2024-04-13 LAB — CBC
HCT: 41.9 % (ref 39.0–52.0)
Hemoglobin: 13.5 g/dL (ref 13.0–17.0)
MCH: 29.3 pg (ref 26.0–34.0)
MCHC: 32.2 g/dL (ref 30.0–36.0)
MCV: 91.1 fL (ref 80.0–100.0)
Platelets: 169 10*3/uL (ref 150–400)
RBC: 4.6 MIL/uL (ref 4.22–5.81)
RDW: 13.8 % (ref 11.5–15.5)
WBC: 8.4 10*3/uL (ref 4.0–10.5)
nRBC: 0 % (ref 0.0–0.2)

## 2024-04-13 LAB — PROTIME-INR
INR: 1 (ref 0.8–1.2)
Prothrombin Time: 13.3 s (ref 11.4–15.2)

## 2024-04-13 LAB — APTT: aPTT: 35 s (ref 24–36)

## 2024-04-13 NOTE — Progress Notes (Signed)
 Surgical Instructions   Your procedure is scheduled on May 14. Report to [facility] Main Entrance "A" at 7:30 A.M., then check in with the Admitting office. Any questions or running late day of surgery: call [phone]  Questions prior to your surgery date: call [phone], Monday-Friday, 8am-4pm. If you experience any cold or flu symptoms such as cough, fever, chills, shortness of breath, etc. between now and your scheduled surgery, please notify us  at the above number.     Remember:  Do not eat or drink anything after midnight the night before your surgery   Take these medicines the morning of surgery with A SIP OF WATER   aspirin  EC  atorvastatin  (LIPITOR)  brimonidine (ALPHAGAN)  clopidogrel  (PLAVIX )  dorzolamide -timolol  (COSOPT )  levothyroxine  (SYNTHROID )  loratadine  (CLARITIN )   May take these medicines IF NEEDED: acetaminophen  (TYLENOL )  sodium chloride  (OCEAN) 0.65 % SOLN nasal spray   One week prior to surgery, STOP taking any Aleve, Naproxen, Ibuprofen, Motrin, Advil, Goody's, BC's, all herbal medications, fish oil, and non-prescription vitamins.                     Do NOT Smoke (Tobacco/Vaping) for 24 hours prior to your procedure.  If you use a CPAP at night, you may bring your mask/headgear for your overnight stay.   You will be asked to remove any contacts, glasses, piercing's, hearing aid's, dentures/partials prior to surgery. Please bring cases for these items if needed.    Patients discharged the day of surgery will not be allowed to drive home, and someone needs to stay with them for 24 hours.  SURGICAL WAITING ROOM VISITATION Patients may have no more than 2 support people in the waiting area - these visitors may rotate.   Pre-op nurse will coordinate an appropriate time for 1 ADULT support person, who may not rotate, to accompany patient in pre-op.  Children under the age of 32 must have an adult with them who is not the patient and must remain in the  main waiting area with an adult.  If the patient needs to stay at the hospital during part of their recovery, the visitor guidelines for inpatient rooms apply.  Please refer to the [facility] website for the visitor guidelines for any additional information.   If you received a COVID test during your pre-op visit  it is requested that you wear a mask when out in public, stay away from anyone that may not be feeling well and notify your surgeon if you develop symptoms. If you have been in contact with anyone that has tested positive in the last 10 days please notify you surgeon.      Pre-operative CHG Bathing Instructions   You can play a key role in reducing the risk of infection after surgery. Your skin needs to be as free of germs as possible. You can reduce the number of germs on your skin by washing with CHG (chlorhexidine  gluconate) soap before surgery. CHG is an antiseptic soap that kills germs and continues to kill germs even after washing.   DO NOT use if you have an allergy to chlorhexidine /CHG or antibacterial soaps. If your skin becomes reddened or irritated, stop using the CHG and notify one of our RNs at [phone].              TAKE A SHOWER THE NIGHT BEFORE SURGERY AND THE DAY OF SURGERY    Please keep in mind the following:  DO NOT shave, including legs  and underarms, 48 hours prior to surgery.   You may shave your face before/day of surgery.  Place clean sheets on your bed the night before surgery Use a clean washcloth (not used since being washed) for each shower. DO NOT sleep with pet's night before surgery.  CHG Shower Instructions:  Wash your face and private area with normal soap. If you choose to wash your hair, wash first with your normal shampoo.  After you use shampoo/soap, rinse your hair and body thoroughly to remove shampoo/soap residue.  Turn the water  OFF and apply half the bottle of CHG soap to a CLEAN washcloth.  Apply CHG soap ONLY FROM YOUR NECK  DOWN TO YOUR TOES (washing for 3-5 minutes)  DO NOT use CHG soap on face, private areas, open wounds, or sores.  Pay special attention to the area where your surgery is being performed.  If you are having back surgery, having someone wash your back for you may be helpful. Wait 2 minutes after CHG soap is applied, then you may rinse off the CHG soap.  Pat dry with a clean towel  Put on clean pajamas    Additional instructions for the day of surgery: DO NOT APPLY any lotions, deodorants, cologne, or perfumes.   Do not wear jewelry or makeup Do not wear nail polish, gel polish, artificial nails, or any other type of covering on natural nails (fingers and toes) Do not bring valuables to the hospital. [facility] is not responsible for valuables/personal belongings. Put on clean/comfortable clothes.  Please brush your teeth.  Ask your nurse before applying any prescription medications to the skin.

## 2024-04-13 NOTE — Progress Notes (Signed)
 PCP - Dettinger, Joshus, MD Cardiologist - Adell Age, MD  PPM/ICD - denies Device Orders - n/a Rep Notified - n/a  Chest x-ray - 02/06/2024 EKG - 02/20/2024 Stress Test - denies ECHO - 13/31/2025 Cardiac Cath - 12/25/2023  Sleep Study - denies CPAP - n/a  Fasting Blood Sugar - no DM Checks Blood Sugar _____ times a day  Last dose of GLP1 agonist-  n/a GLP1 instructions: n/a  Blood Thinner Instructions: continue Aspirin  and Plavix  per MD order   ERAS Protcol - no, NPO at midnight PRE-SURGERY Ensure or G2- n/a  COVID TEST- n/a   Anesthesia review: cardiac clearance on 03/08/2024. TAVR in 02/10/2024. Per pt he was told that he has heart murmur a couple of years ago. No chest pain, SOB and other symptoms. Royston Cornea, [place] is aware. Will send the pt's chart for the review.   Patient denies shortness of breath, fever, cough and chest pain at PAT appointment   All instructions explained to the patient, with a verbal understanding of the material. Patient agrees to go over the instructions while at home for a better understanding. Patient also instructed to self quarantine after being tested for COVID-19. The opportunity to ask questions was provided.

## 2024-04-14 NOTE — Progress Notes (Signed)
 Anesthesia Chart Review:  83 year old male follows with cardiology for history of carotid artery disease (40 to 59% RICA and 1 to 39% LICA by duplex 09/2023), CAD s/p PCI/DES to pRCA (12/25/23), AAA and severe aortic stenosis s/p TAVR (02/10/24). He was found to have severe aortic stenosis by echo in June 2024. Cardiac cath January 2025 with severe RCA stenosis treated with a drug eluting stent. CTA with 3 cm infrarenal saccular aneurysm with associated penetrating ulcer. He was seen in [facility] by Dr. Fulton Job and cleared to proceed with TAVR with likely future aortic repair. He underwent TAVR on 02/10/24.  Last seen in follow-up by Jeronimo Moors, PA-C on 03/08/2024.  Per note, echo at that time showed EF of 70%, normally functioning TAVR with a mean gradient of 9.4 mmHg and no PVL as well as a small basal septal VSD (previously noted, not felt to need any intervention).  He was noted to be euvolemic, back to playing golf without limitation.  Upcoming AAA repair also discussed.  Per note, "Discussed case with Dr. Rolm Clos and Dr. Abel Hoe who feel he is cleared from a cardiac standpoint for aneurysm repair at least 6 weeks out from TAVR (after 03/23/24) if it can be done while on dual antiplatelet therapy. If not, can wait until he comes off Plavix  in July."  Other pertinent history includes former smoker (20 pack years, quit 1983), remote history of pleural effusion s/p VATS 2001, hypothyroidism, history of Guillain-Barr syndrome 2001.  Preop labs reviewed, WNL.  EKG 02/20/2024: Sinus rhythm with occasional Premature ventricular complexes and Premature atrial complexes with ventricular escape complexes.  Rate 69. Left anterior fascicular block  TTE 03/08/2024: 1. Possible small basal setpal VSD as previously noted. Left ventricular  ejection fraction, by estimation, is 70 to 75%. Left ventricular ejection  fraction by PLAX is 73 %. The left ventricle has hyperdynamic function.  The left ventricle has no regional    wall motion abnormalities. Left ventricular diastolic parameters are  consistent with Grade I diastolic dysfunction (impaired relaxation).  Elevated left ventricular end-diastolic pressure. The E/e' is 15.   2. Right ventricular systolic function is normal. The right ventricular  size is normal. There is normal pulmonary artery systolic pressure.   3. Left atrial size was moderately dilated.   4. The mitral valve is grossly normal. No evidence of mitral valve  regurgitation.   5. The aortic valve has been repaired/replaced. Aortic valve  regurgitation is trivial. There is a 26 mm Sapien prosthetic (TAVR) valve  present in the aortic position. Echo findings are consistent with  perivalvular leak of the aortic prosthesis. Aortic  valve area, by VTI measures 1.69 cm. Aortic valve mean gradient measures  9.4 mmHg. Aortic valve Vmax measures 2.07 m/s. Peak gradient 17.1 mmHg, DI  is 0.54.   6. The inferior vena cava is normal in size with greater than 50%  respiratory variability, suggesting right atrial pressure of 3 mmHg.   Comparison(s): Changes from prior study are noted. 02/11/2024: LVEF 70-75%,  TAVR MG 11 mmHg.    Cath and PCI 12/25/2023:    Prox RCA lesion is 99% stenosed.   Mid Cx to Dist Cx lesion is 30% stenosed.   Mid LM to Dist LM lesion is 30% stenosed.   Prox LAD to Mid LAD lesion is 30% stenosed.   Mid RCA lesion is 30% stenosed.   A drug-eluting stent was successfully placed using a STENT ONYX FRONTIER 2.75X15.   Post intervention, there is a 0%  residual stenosis.   Severe stenosis in the proximal RCA (co-dominant vessel) Successful PTCA/DES x 1 proximal RCA Mild non-obstructive disease in the distal left main, proximal LAD and mid Circumflex Normal right heart pressures.  Severe aortic stenosis by echo. I did not cross the valve today.    Recommendations: Will continue DAPT with ASA and Plavix . Same day post PCI discharge (6 hours). Continue workup for TAVR.      Edilia Gordon [facility] Short Stay Center/Anesthesiology Phone [phone] 04/14/2024 12:46 PM

## 2024-04-14 NOTE — Anesthesia Preprocedure Evaluation (Addendum)
 Anesthesia Evaluation  Patient identified by MRN, date of birth, ID band Patient awake    Reviewed: Allergy & Precautions, NPO status , Patient's Chart, lab work & pertinent test results  Airway Mallampati: II  TM Distance: >3 FB Neck ROM: Full    Dental  (+) Teeth Intact, Dental Advisory Given   Pulmonary former smoker   breath sounds clear to auscultation       Cardiovascular + CAD, + Cardiac Stents and + Peripheral Vascular Disease  + Valvular Problems/Murmurs AS  Rhythm:Regular Rate:Normal  - s/p TAVR   Neuro/Psych  Neuromuscular disease  negative psych ROS   GI/Hepatic negative GI ROS, Neg liver ROS,,,  Endo/Other  Hypothyroidism    Renal/GU negative Renal ROS     Musculoskeletal negative musculoskeletal ROS (+)    Abdominal   Peds  Hematology  (+) Blood dyscrasia, anemia   Anesthesia Other Findings   Reproductive/Obstetrics                             Anesthesia Physical Anesthesia Plan  ASA: 3  Anesthesia Plan: General   Post-op Pain Management: Ofirmev  IV (intra-op)*   Induction: Intravenous  PONV Risk Score and Plan: 3 and Ondansetron  and Treatment may vary due to age or medical condition  Airway Management Planned: Oral ETT  Additional Equipment: Arterial line  Intra-op Plan:   Post-operative Plan: Extubation in OR  Informed Consent: I have reviewed the patients History and Physical, chart, labs and discussed the procedure including the risks, benefits and alternatives for the proposed anesthesia with the patient or authorized representative who has indicated his/her understanding and acceptance.     Dental advisory given  Plan Discussed with: CRNA  Anesthesia Plan Comments: (PAT note by Rudy Costain, PA-C:  83 year old male follows with cardiology for history of carotid artery disease (40 to 59% RICA and 1 to 39% LICA by duplex 09/2023), CAD s/p PCI/DES to pRCA  (12/25/23), AAA and severe aortic stenosis s/p TAVR (02/10/24). He was found to have severe aortic stenosis by echo in June 2024. Cardiac cath January 2025 with severe RCA stenosis treated with a drug eluting stent. CTA with 3 cm infrarenal saccular aneurysm with associated penetrating ulcer. He was seen in [facility] by Dr. Fulton Job and cleared to proceed with TAVR with likely future aortic repair. He underwent TAVR on 02/10/24.  Last seen in follow-up by Jeronimo Moors, PA-C on 03/08/2024.  Per note, echo at that time showed EF of 70%, normally functioning TAVR with a mean gradient of 9.4 mmHg and no PVL as well as a small basal septal VSD (previously noted, not felt to need any intervention).  He was noted to be euvolemic, back to playing golf without limitation.  Upcoming AAA repair also discussed.  Per note, "Discussed case with Dr. Rolm Clos and Dr. Abel Hoe who feel he is cleared from a cardiac standpoint for aneurysm repair at least 6 weeks out from TAVR (after 03/23/24) if it can be done while on dual antiplatelet therapy. If not, can wait until he comes off Plavix  in July."  Other pertinent history includes former smoker (20 pack years, quit 1983), remote history of pleural effusion s/p VATS 2001, hypothyroidism, history of Guillain-Barr syndrome 2001.  Preop labs reviewed, WNL.  EKG 02/20/2024: Sinus rhythm with occasional Premature ventricular complexes and Premature atrial complexes with ventricular escape complexes.  Rate 69. Left anterior fascicular block  TTE 03/08/2024: 1. Possible small basal setpal VSD as  previously noted. Left ventricular  ejection fraction, by estimation, is 70 to 75%. Left ventricular ejection  fraction by PLAX is 73 %. The left ventricle has hyperdynamic function.  The left ventricle has no regional  wall motion abnormalities. Left ventricular diastolic parameters are  consistent with Grade I diastolic dysfunction (impaired relaxation).  Elevated left ventricular end-diastolic  pressure. The E/e' is 15.  2. Right ventricular systolic function is normal. The right ventricular  size is normal. There is normal pulmonary artery systolic pressure.  3. Left atrial size was moderately dilated.  4. The mitral valve is grossly normal. No evidence of mitral valve  regurgitation.  5. The aortic valve has been repaired/replaced. Aortic valve  regurgitation is trivial. There is a 26 mm Sapien prosthetic (TAVR) valve  present in the aortic position. Echo findings are consistent with  perivalvular leak of the aortic prosthesis. Aortic  valve area, by VTI measures 1.69 cm. Aortic valve mean gradient measures  9.4 mmHg. Aortic valve Vmax measures 2.07 m/s. Peak gradient 17.1 mmHg, DI  is 0.54.  6. The inferior vena cava is normal in size with greater than 50%  respiratory variability, suggesting right atrial pressure of 3 mmHg.   Comparison(s): Changes from prior study are noted. 02/11/2024: LVEF 70-75%,  TAVR MG 11 mmHg.    Cath and PCI 12/25/2023:    Prox RCA lesion is 99% stenosed.   Mid Cx to Dist Cx lesion is 30% stenosed.   Mid LM to Dist LM lesion is 30% stenosed.   Prox LAD to Mid LAD lesion is 30% stenosed.   Mid RCA lesion is 30% stenosed.   A drug-eluting stent was successfully placed using a STENT ONYX FRONTIER 2.75X15.   Post intervention, there is a 0% residual stenosis.  Severe stenosis in the proximal RCA (co-dominant vessel) Successful PTCA/DES x 1 proximal RCA Mild non-obstructive disease in the distal left main, proximal LAD and mid Circumflex Normal right heart pressures.  Severe aortic stenosis by echo. I did not cross the valve today.   Recommendations: Will continue DAPT with ASA and Plavix . Same day post PCI discharge (6 hours). Continue workup for TAVR.     )        Anesthesia Quick Evaluation

## 2024-04-16 DIAGNOSIS — H401213 Low-tension glaucoma, right eye, severe stage: Secondary | ICD-10-CM | POA: Diagnosis not present

## 2024-04-16 DIAGNOSIS — H401221 Low-tension glaucoma, left eye, mild stage: Secondary | ICD-10-CM | POA: Diagnosis not present

## 2024-04-16 DIAGNOSIS — H25813 Combined forms of age-related cataract, bilateral: Secondary | ICD-10-CM | POA: Diagnosis not present

## 2024-04-16 LAB — HM DIABETES EYE EXAM

## 2024-04-21 ENCOUNTER — Encounter (HOSPITAL_COMMUNITY): Payer: Self-pay | Admitting: Vascular Surgery

## 2024-04-21 ENCOUNTER — Encounter (HOSPITAL_COMMUNITY)
Admission: RE | Disposition: A | Discharge status: Home Health | Payer: Self-pay | Source: Home / Self Care | Attending: Vascular Surgery

## 2024-04-21 ENCOUNTER — Other Ambulatory Visit: Payer: Self-pay

## 2024-04-21 ENCOUNTER — Inpatient Hospital Stay (HOSPITAL_COMMUNITY)
Admission: RE | Admit: 2024-04-21 | Discharge: 2024-04-22 | DRG: 269 | Disposition: A | Discharge status: Home Health | Attending: Vascular Surgery | Admitting: Vascular Surgery

## 2024-04-21 ENCOUNTER — Inpatient Hospital Stay (HOSPITAL_COMMUNITY): Admitting: Certified Registered Nurse Anesthetist

## 2024-04-21 ENCOUNTER — Inpatient Hospital Stay (HOSPITAL_COMMUNITY): Admitting: Certified Registered"

## 2024-04-21 ENCOUNTER — Inpatient Hospital Stay (HOSPITAL_COMMUNITY): Payer: Self-pay | Admitting: Physician Assistant

## 2024-04-21 ENCOUNTER — Inpatient Hospital Stay (HOSPITAL_COMMUNITY)

## 2024-04-21 DIAGNOSIS — I2510 Atherosclerotic heart disease of native coronary artery without angina pectoris: Secondary | ICD-10-CM | POA: Diagnosis not present

## 2024-04-21 DIAGNOSIS — Z7982 Long term (current) use of aspirin: Secondary | ICD-10-CM | POA: Diagnosis not present

## 2024-04-21 DIAGNOSIS — Z91041 Radiographic dye allergy status: Secondary | ICD-10-CM | POA: Diagnosis not present

## 2024-04-21 DIAGNOSIS — E785 Hyperlipidemia, unspecified: Secondary | ICD-10-CM

## 2024-04-21 DIAGNOSIS — Z825 Family history of asthma and other chronic lower respiratory diseases: Secondary | ICD-10-CM

## 2024-04-21 DIAGNOSIS — Z881 Allergy status to other antibiotic agents status: Secondary | ICD-10-CM

## 2024-04-21 DIAGNOSIS — Z87891 Personal history of nicotine dependence: Secondary | ICD-10-CM | POA: Diagnosis not present

## 2024-04-21 DIAGNOSIS — N401 Enlarged prostate with lower urinary tract symptoms: Secondary | ICD-10-CM | POA: Diagnosis not present

## 2024-04-21 DIAGNOSIS — Z8249 Family history of ischemic heart disease and other diseases of the circulatory system: Secondary | ICD-10-CM

## 2024-04-21 DIAGNOSIS — I973 Postprocedural hypertension: Secondary | ICD-10-CM

## 2024-04-21 DIAGNOSIS — Z79899 Other long term (current) drug therapy: Secondary | ICD-10-CM

## 2024-04-21 DIAGNOSIS — I9581 Postprocedural hypotension: Secondary | ICD-10-CM | POA: Diagnosis not present

## 2024-04-21 DIAGNOSIS — Z888 Allergy status to other drugs, medicaments and biological substances status: Secondary | ICD-10-CM

## 2024-04-21 DIAGNOSIS — Z955 Presence of coronary angioplasty implant and graft: Secondary | ICD-10-CM

## 2024-04-21 DIAGNOSIS — Z83438 Family history of other disorder of lipoprotein metabolism and other lipidemia: Secondary | ICD-10-CM

## 2024-04-21 DIAGNOSIS — E039 Hypothyroidism, unspecified: Secondary | ICD-10-CM

## 2024-04-21 DIAGNOSIS — I7143 Infrarenal abdominal aortic aneurysm, without rupture: Secondary | ICD-10-CM

## 2024-04-21 DIAGNOSIS — Z7902 Long term (current) use of antithrombotics/antiplatelets: Secondary | ICD-10-CM | POA: Diagnosis not present

## 2024-04-21 DIAGNOSIS — Z952 Presence of prosthetic heart valve: Secondary | ICD-10-CM

## 2024-04-21 DIAGNOSIS — Z79890 Hormone replacement therapy (postmenopausal): Secondary | ICD-10-CM

## 2024-04-21 DIAGNOSIS — Z9889 Other specified postprocedural states: Secondary | ICD-10-CM

## 2024-04-21 DIAGNOSIS — I959 Hypotension, unspecified: Secondary | ICD-10-CM | POA: Diagnosis not present

## 2024-04-21 DIAGNOSIS — I714 Abdominal aortic aneurysm, without rupture, unspecified: Principal | ICD-10-CM | POA: Diagnosis present

## 2024-04-21 DIAGNOSIS — Z95828 Presence of other vascular implants and grafts: Secondary | ICD-10-CM

## 2024-04-21 DIAGNOSIS — N138 Other obstructive and reflux uropathy: Secondary | ICD-10-CM | POA: Diagnosis not present

## 2024-04-21 DIAGNOSIS — Z8551 Personal history of malignant neoplasm of bladder: Secondary | ICD-10-CM | POA: Diagnosis not present

## 2024-04-21 DIAGNOSIS — Z882 Allergy status to sulfonamides status: Secondary | ICD-10-CM | POA: Diagnosis not present

## 2024-04-21 DIAGNOSIS — Z887 Allergy status to serum and vaccine status: Secondary | ICD-10-CM | POA: Diagnosis not present

## 2024-04-21 DIAGNOSIS — I739 Peripheral vascular disease, unspecified: Secondary | ICD-10-CM | POA: Diagnosis present

## 2024-04-21 HISTORY — PX: AORTOGRAM: SHX6300

## 2024-04-21 HISTORY — PX: ABDOMINAL AORTIC ENDOVASCULAR STENT GRAFT: SHX5707

## 2024-04-21 HISTORY — PX: ULTRASOUND GUIDANCE FOR VASCULAR ACCESS: SHX6516

## 2024-04-21 LAB — BASIC METABOLIC PANEL WITH GFR
Anion gap: 8 (ref 5–15)
BUN: 8 mg/dL (ref 8–23)
CO2: 25 mmol/L (ref 22–32)
Calcium: 8.2 mg/dL — ABNORMAL LOW (ref 8.9–10.3)
Chloride: 105 mmol/L (ref 98–111)
Creatinine, Ser: 0.83 mg/dL (ref 0.61–1.24)
GFR, Estimated: >60 mL/min (ref >60)
Glucose, Bld: 105 mg/dL — ABNORMAL HIGH (ref 70–99)
Potassium: 3.6 mmol/L (ref 3.5–5.1)
Sodium: 138 mmol/L (ref 135–145)

## 2024-04-21 LAB — CBC
HCT: 33.7 % — ABNORMAL LOW (ref 39.0–52.0)
Hemoglobin: 10.7 g/dL — ABNORMAL LOW (ref 13.0–17.0)
MCH: 28.5 pg (ref 26.0–34.0)
MCHC: 31.8 g/dL (ref 30.0–36.0)
MCV: 89.6 fL (ref 80.0–100.0)
Platelets: 138 10*3/uL — ABNORMAL LOW (ref 150–400)
RBC: 3.76 MIL/uL — ABNORMAL LOW (ref 4.22–5.81)
RDW: 14 % (ref 11.5–15.5)
WBC: 9.1 10*3/uL (ref 4.0–10.5)
nRBC: 0 % (ref 0.0–0.2)

## 2024-04-21 LAB — POCT I-STAT 7, (LYTES, BLD GAS, ICA,H+H)
Acid-base deficit: 4 mmol/L — ABNORMAL HIGH (ref 0.0–2.0)
Bicarbonate: 21.3 mmol/L (ref 20.0–28.0)
Calcium, Ion: 1.16 mmol/L (ref 1.15–1.40)
HCT: 28 % — ABNORMAL LOW (ref 39.0–52.0)
Hemoglobin: 9.5 g/dL — ABNORMAL LOW (ref 13.0–17.0)
O2 Saturation: 99 %
Potassium: 3.6 mmol/L (ref 3.5–5.1)
Sodium: 140 mmol/L (ref 135–145)
TCO2: 22 mmol/L (ref 22–32)
pCO2 arterial: 39.3 mmHg (ref 32–48)
pH, Arterial: 7.342 — ABNORMAL LOW (ref 7.35–7.45)
pO2, Arterial: 133 mmHg — ABNORMAL HIGH (ref 83–108)

## 2024-04-21 LAB — PROTIME-INR
INR: 1.2 (ref 0.8–1.2)
Prothrombin Time: 15.6 s — ABNORMAL HIGH (ref 11.4–15.2)

## 2024-04-21 LAB — MAGNESIUM: Magnesium: 1.9 mg/dL (ref 1.7–2.4)

## 2024-04-21 LAB — APTT: aPTT: 39 s — ABNORMAL HIGH (ref 24–36)

## 2024-04-21 LAB — TROPONIN I (HIGH SENSITIVITY): Troponin I (High Sensitivity): 12 ng/L (ref <18)

## 2024-04-21 LAB — PREPARE RBC (CROSSMATCH)

## 2024-04-21 SURGERY — AORTOGRAM
Anesthesia: Monitor Anesthesia Care | Site: Groin

## 2024-04-21 SURGERY — INSERTION, ENDOVASCULAR STENT GRAFT, AORTA, ABDOMINAL
Anesthesia: General | Site: Groin | Laterality: Right

## 2024-04-21 MED ORDER — EPHEDRINE SULFATE-NACL 50-0.9 MG/10ML-% IV SOSY
PREFILLED_SYRINGE | INTRAVENOUS | Status: DC | PRN
  Administered 2024-04-21: 5 mg via INTRAVENOUS

## 2024-04-21 MED ORDER — ACETAMINOPHEN 650 MG RE SUPP: 325.0000 mg | RECTAL | Status: DC | PRN

## 2024-04-21 MED ORDER — PROPOFOL 10 MG/ML IV BOLUS
INTRAVENOUS | Status: DC | PRN
  Administered 2024-04-21: 100 mg via INTRAVENOUS
  Administered 2024-04-21: 50 mg via INTRAVENOUS

## 2024-04-21 MED ORDER — HYDROMORPHONE HCL 1 MG/ML IJ SOLN: 0.5000 mg | INTRAMUSCULAR | Status: DC | PRN

## 2024-04-21 MED ORDER — ONDANSETRON HCL 4 MG/2ML IJ SOLN
INTRAMUSCULAR | Status: DC | PRN
  Administered 2024-04-21: 4 mg via INTRAVENOUS

## 2024-04-21 MED ORDER — ROCURONIUM BROMIDE 10 MG/ML (PF) SYRINGE
PREFILLED_SYRINGE | INTRAVENOUS | Status: DC | PRN
  Administered 2024-04-21: 50 mg via INTRAVENOUS

## 2024-04-21 MED ORDER — CHLORHEXIDINE GLUCONATE CLOTH 2 % EX PADS: 6.0000 | MEDICATED_PAD | Freq: Once | CUTANEOUS | Status: DC

## 2024-04-21 MED ORDER — LEVOTHYROXINE SODIUM 25 MCG PO TABS
137.0000 ug | ORAL_TABLET | Freq: Every day | ORAL | Status: DC
  Administered 2024-04-22: 137 ug via ORAL
  Filled 2024-04-21: qty 1

## 2024-04-21 MED ORDER — LIDOCAINE-EPINEPHRINE (PF) 1 %-1:200000 IJ SOLN
INTRAMUSCULAR | Status: AC
  Filled 2024-04-21: qty 30

## 2024-04-21 MED ORDER — OXYCODONE-ACETAMINOPHEN 5-325 MG PO TABS
1.0000 | ORAL_TABLET | ORAL | Status: DC | PRN
Start: 2024-04-21 — End: 2024-04-22

## 2024-04-21 MED ORDER — HEPARIN 6000 UNIT IRRIGATION SOLUTION
Status: AC
  Filled 2024-04-21: qty 500

## 2024-04-21 MED ORDER — LIDOCAINE-EPINEPHRINE (PF) 1 %-1:200000 IJ SOLN
INTRAMUSCULAR | Status: DC | PRN
  Administered 2024-04-21: 7 mL

## 2024-04-21 MED ORDER — SENNOSIDES-DOCUSATE SODIUM 8.6-50 MG PO TABS: 1.0000 | ORAL_TABLET | Freq: Every evening | ORAL | Status: DC | PRN

## 2024-04-21 MED ORDER — PHENYLEPHRINE 80 MCG/ML (10ML) SYRINGE FOR IV PUSH (FOR BLOOD PRESSURE SUPPORT)
PREFILLED_SYRINGE | INTRAVENOUS | Status: DC | PRN
  Administered 2024-04-21: 160 ug via INTRAVENOUS

## 2024-04-21 MED ORDER — SODIUM CHLORIDE 0.9% IV SOLUTION: Freq: Once | INTRAVENOUS | Status: DC

## 2024-04-21 MED ORDER — LORATADINE 10 MG PO TABS
10.0000 mg | ORAL_TABLET | Freq: Every day | ORAL | Status: DC
  Administered 2024-04-22: 10 mg via ORAL
  Filled 2024-04-21: qty 1

## 2024-04-21 MED ORDER — HYOSCYAMINE SULFATE 0.125 MG SL SUBL: 0.1250 mg | SUBLINGUAL_TABLET | Freq: Four times a day (QID) | SUBLINGUAL | Status: DC | PRN

## 2024-04-21 MED ORDER — PHENYLEPHRINE HCL-NACL 20-0.9 MG/250ML-% IV SOLN
INTRAVENOUS | Status: DC | PRN
Start: 2024-04-21 — End: 2024-04-21
  Administered 2024-04-21: 25 ug/min via INTRAVENOUS

## 2024-04-21 MED ORDER — CEFAZOLIN SODIUM-DEXTROSE 2-4 GM/100ML-% IV SOLN
2.0000 g | INTRAVENOUS | Status: AC
  Administered 2024-04-21: 2 g via INTRAVENOUS
  Filled 2024-04-21: qty 100

## 2024-04-21 MED ORDER — ALUM & MAG HYDROXIDE-SIMETH 200-200-20 MG/5ML PO SUSP: 15.0000 mL | ORAL | Status: DC | PRN

## 2024-04-21 MED ORDER — HEPARIN 6000 UNIT IRRIGATION SOLUTION
Status: DC | PRN
  Administered 2024-04-21: 1

## 2024-04-21 MED ORDER — DIPHENHYDRAMINE HCL 50 MG/ML IJ SOLN
INTRAMUSCULAR | Status: AC
Start: 2024-04-21 — End: ?
  Filled 2024-04-21: qty 1

## 2024-04-21 MED ORDER — GUAIFENESIN-DM 100-10 MG/5ML PO SYRP: 15.0000 mL | ORAL_SOLUTION | ORAL | Status: DC | PRN

## 2024-04-21 MED ORDER — ONDANSETRON HCL 4 MG/2ML IJ SOLN: 4.0000 mg | Freq: Four times a day (QID) | INTRAMUSCULAR | Status: DC | PRN

## 2024-04-21 MED ORDER — LABETALOL HCL 5 MG/ML IV SOLN: 10.0000 mg | INTRAVENOUS | Status: DC | PRN

## 2024-04-21 MED ORDER — DOCUSATE SODIUM 100 MG PO CAPS
100.0000 mg | ORAL_CAPSULE | Freq: Every day | ORAL | Status: DC
  Administered 2024-04-22: 100 mg via ORAL
  Filled 2024-04-21: qty 1

## 2024-04-21 MED ORDER — PROPOFOL 10 MG/ML IV BOLUS
INTRAVENOUS | Status: AC
  Filled 2024-04-21: qty 20

## 2024-04-21 MED ORDER — MAGNESIUM SULFATE 2 GM/50ML IV SOLN: 2.0000 g | Freq: Every day | INTRAVENOUS | Status: DC | PRN

## 2024-04-21 MED ORDER — HEPARIN SODIUM (PORCINE) 5000 UNIT/ML IJ SOLN
5000.0000 [IU] | Freq: Three times a day (TID) | INTRAMUSCULAR | Status: DC
  Administered 2024-04-22: 5000 [IU] via SUBCUTANEOUS
  Filled 2024-04-21: qty 1

## 2024-04-21 MED ORDER — POTASSIUM CHLORIDE CRYS ER 20 MEQ PO TBCR: 20.0000 meq | EXTENDED_RELEASE_TABLET | Freq: Every day | ORAL | Status: DC | PRN

## 2024-04-21 MED ORDER — ATORVASTATIN CALCIUM 80 MG PO TABS
80.0000 mg | ORAL_TABLET | Freq: Every day | ORAL | Status: DC
  Administered 2024-04-22: 80 mg via ORAL
  Filled 2024-04-21: qty 1

## 2024-04-21 MED ORDER — PROPOFOL 500 MG/50ML IV EMUL
INTRAVENOUS | Status: DC | PRN
  Administered 2024-04-21: 50 ug/kg/min via INTRAVENOUS

## 2024-04-21 MED ORDER — METOPROLOL TARTRATE 5 MG/5ML IV SOLN
2.0000 mg | INTRAVENOUS | Status: DC | PRN
Start: 2024-04-21 — End: 2024-04-22

## 2024-04-21 MED ORDER — BRIMONIDINE TARTRATE 0.2 % OP SOLN
1.0000 [drp] | Freq: Three times a day (TID) | OPHTHALMIC | Status: DC
  Administered 2024-04-21 – 2024-04-22 (×2): 1 [drp] via OPHTHALMIC
  Filled 2024-04-21: qty 5

## 2024-04-21 MED ORDER — DIPHENHYDRAMINE HCL 50 MG/ML IJ SOLN
INTRAMUSCULAR | Status: DC | PRN
  Administered 2024-04-21: 25 mg via INTRAVENOUS

## 2024-04-21 MED ORDER — LIDOCAINE 2% (20 MG/ML) 5 ML SYRINGE
INTRAMUSCULAR | Status: DC | PRN
  Administered 2024-04-21: 40 mg via INTRAVENOUS

## 2024-04-21 MED ORDER — ESMOLOL HCL 100 MG/10ML IV SOLN
INTRAVENOUS | Status: DC | PRN
  Administered 2024-04-21: 20 mg via INTRAVENOUS

## 2024-04-21 MED ORDER — SODIUM CHLORIDE 0.9 % IV SOLN: INTRAVENOUS | Status: DC

## 2024-04-21 MED ORDER — ORAL CARE MOUTH RINSE: 15.0000 mL | Freq: Once | OROMUCOSAL | Status: AC

## 2024-04-21 MED ORDER — LIDOCAINE HCL (PF) 1 % IJ SOLN
INTRAMUSCULAR | Status: AC
  Filled 2024-04-21: qty 30

## 2024-04-21 MED ORDER — ALBUMIN HUMAN 5 % IV SOLN
12.5000 g | Freq: Once | INTRAVENOUS | Status: AC
  Administered 2024-04-21: 12.5 g via INTRAVENOUS

## 2024-04-21 MED ORDER — IODIXANOL 320 MG/ML IV SOLN
INTRAVENOUS | Status: DC | PRN
  Administered 2024-04-21: 30 mL via INTRA_ARTERIAL

## 2024-04-21 MED ORDER — SALINE SPRAY 0.65 % NA SOLN: 1.0000 | NASAL | Status: DC | PRN

## 2024-04-21 MED ORDER — CLOPIDOGREL BISULFATE 75 MG PO TABS
75.0000 mg | ORAL_TABLET | Freq: Every day | ORAL | Status: DC
  Administered 2024-04-22: 75 mg via ORAL
  Filled 2024-04-21: qty 1

## 2024-04-21 MED ORDER — IODIXANOL 320 MG/ML IV SOLN
INTRAVENOUS | Status: DC | PRN
  Administered 2024-04-21: 66 mL via INTRA_ARTERIAL

## 2024-04-21 MED ORDER — PHENOL 1.4 % MT LIQD: 1.0000 | OROMUCOSAL | Status: DC | PRN

## 2024-04-21 MED ORDER — CHLORHEXIDINE GLUCONATE 0.12 % MT SOLN
15.0000 mL | Freq: Once | OROMUCOSAL | Status: AC
  Administered 2024-04-21: 15 mL via OROMUCOSAL
  Filled 2024-04-21: qty 15

## 2024-04-21 MED ORDER — ASPIRIN 81 MG PO TBEC
81.0000 mg | DELAYED_RELEASE_TABLET | Freq: Every day | ORAL | Status: DC
  Administered 2024-04-22: 81 mg via ORAL
  Filled 2024-04-21: qty 1

## 2024-04-21 MED ORDER — PANTOPRAZOLE SODIUM 40 MG PO TBEC
40.0000 mg | DELAYED_RELEASE_TABLET | Freq: Every day | ORAL | Status: DC
  Administered 2024-04-21 – 2024-04-22 (×2): 40 mg via ORAL
  Filled 2024-04-21 (×2): qty 1

## 2024-04-21 MED ORDER — NALOXONE HCL 0.4 MG/ML IJ SOLN
INTRAMUSCULAR | Status: DC | PRN
  Administered 2024-04-21 (×2): 40 ug via INTRAVENOUS

## 2024-04-21 MED ORDER — LACTATED RINGERS IV SOLN: INTRAVENOUS | Status: DC

## 2024-04-21 MED ORDER — ALBUMIN HUMAN 5 % IV SOLN
INTRAVENOUS | Status: AC
  Filled 2024-04-21: qty 250

## 2024-04-21 MED ORDER — FENTANYL CITRATE (PF) 100 MCG/2ML IJ SOLN: 25.0000 ug | INTRAMUSCULAR | Status: DC | PRN

## 2024-04-21 MED ORDER — HYDRALAZINE HCL 20 MG/ML IJ SOLN: 5.0000 mg | INTRAMUSCULAR | Status: DC | PRN

## 2024-04-21 MED ORDER — DIPHENHYDRAMINE HCL 25 MG PO TABS: 25.0000 mg | ORAL_TABLET | Freq: Four times a day (QID) | ORAL | Status: DC | PRN

## 2024-04-21 MED ORDER — LATANOPROST 0.005 % OP SOLN
1.0000 [drp] | Freq: Every day | OPHTHALMIC | Status: DC
  Administered 2024-04-21: 1 [drp] via OPHTHALMIC
  Filled 2024-04-21: qty 2.5

## 2024-04-21 MED ORDER — SUGAMMADEX SODIUM 200 MG/2ML IV SOLN
INTRAVENOUS | Status: DC | PRN
  Administered 2024-04-21: 300 mg via INTRAVENOUS

## 2024-04-21 MED ORDER — CEFAZOLIN SODIUM-DEXTROSE 2-4 GM/100ML-% IV SOLN
2.0000 g | Freq: Three times a day (TID) | INTRAVENOUS | Status: AC
  Administered 2024-04-21 – 2024-04-22 (×2): 2 g via INTRAVENOUS
  Filled 2024-04-21 (×2): qty 100

## 2024-04-21 MED ORDER — ACETAMINOPHEN 325 MG PO TABS
325.0000 mg | ORAL_TABLET | ORAL | Status: DC | PRN
  Administered 2024-04-22: 650 mg via ORAL
  Filled 2024-04-21: qty 2

## 2024-04-21 MED ORDER — SODIUM CHLORIDE 0.9 % IV SOLN: 500.0000 mL | Freq: Once | INTRAVENOUS | Status: DC | PRN

## 2024-04-21 MED ORDER — PROTAMINE SULFATE 10 MG/ML IV SOLN
INTRAVENOUS | Status: DC | PRN
  Administered 2024-04-21: 50 mg via INTRAVENOUS

## 2024-04-21 MED ORDER — BISACODYL 5 MG PO TBEC: 5.0000 mg | DELAYED_RELEASE_TABLET | Freq: Every day | ORAL | Status: DC | PRN

## 2024-04-21 MED ORDER — FENTANYL CITRATE (PF) 250 MCG/5ML IJ SOLN
INTRAMUSCULAR | Status: AC
  Filled 2024-04-21: qty 5

## 2024-04-21 MED ORDER — FERROUS SULFATE 325 (65 FE) MG PO TABS
325.0000 mg | ORAL_TABLET | Freq: Every day | ORAL | Status: DC
  Administered 2024-04-22: 325 mg via ORAL
  Filled 2024-04-21: qty 1

## 2024-04-21 MED ORDER — DORZOLAMIDE HCL-TIMOLOL MAL 2-0.5 % OP SOLN
1.0000 [drp] | Freq: Two times a day (BID) | OPHTHALMIC | Status: DC
  Administered 2024-04-21 – 2024-04-22 (×2): 1 [drp] via OPHTHALMIC
  Filled 2024-04-21: qty 10

## 2024-04-21 MED ORDER — FENTANYL CITRATE (PF) 250 MCG/5ML IJ SOLN
INTRAMUSCULAR | Status: DC | PRN
  Administered 2024-04-21: 100 ug via INTRAVENOUS

## 2024-04-21 MED ORDER — NETARSUDIL-LATANOPROST 0.02-0.005 % OP SOLN: 1.0000 [drp] | Freq: Every day | OPHTHALMIC | Status: DC

## 2024-04-21 MED ORDER — PHENYLEPHRINE HCL-NACL 20-0.9 MG/250ML-% IV SOLN
INTRAVENOUS | Status: DC | PRN
Start: 2024-04-21 — End: 2024-04-21
  Administered 2024-04-21: 50 ug/min via INTRAVENOUS

## 2024-04-21 MED ORDER — MAGNESIUM OXIDE -MG SUPPLEMENT 400 (240 MG) MG PO TABS
400.0000 mg | ORAL_TABLET | Freq: Every day | ORAL | Status: DC
  Administered 2024-04-22: 400 mg via ORAL
  Filled 2024-04-21: qty 1

## 2024-04-21 SURGICAL SUPPLY — 49 items
BAG COUNTER SPONGE SURGICOUNT (BAG) ×3 IMPLANT
BANDAGE ESMARK 6X9 LF (GAUZE/BANDAGES/DRESSINGS) IMPLANT
CANISTER SUCTION 3000ML PPV (SUCTIONS) ×3 IMPLANT
CATH ANGIO 5F PIGTAIL 65CM (CATHETERS) ×1 IMPLANT
CATH OMNI FLUSH .035X70CM (CATHETERS) ×1 IMPLANT
CLIP TI MEDIUM 24 (CLIP) ×3 IMPLANT
CLIP TI WIDE RED SMALL 24 (CLIP) ×3 IMPLANT
CLOSURE MYNX CONTROL 5F (Vascular Products) IMPLANT
COVER PROBE W GEL 5X96 (DRAPES) IMPLANT
CUFF TOURN SGL QUICK 42 (TOURNIQUET CUFF) IMPLANT
CUFF TRNQT CYL 24X4X16.5-23 (TOURNIQUET CUFF) IMPLANT
CUFF TRNQT CYL 34X4.125X (TOURNIQUET CUFF) IMPLANT
DERMABOND ADVANCED .7 DNX12 (GAUZE/BANDAGES/DRESSINGS) ×1 IMPLANT
DEVICE CLOSURE MYNXGRIP 5F (Vascular Products) ×1 IMPLANT
DRAPE C-ARM 42X72 X-RAY (DRAPES) ×3 IMPLANT
ELECTRODE REM PT RTRN 9FT ADLT (ELECTROSURGICAL) ×3 IMPLANT
EVACUATOR SILICONE 100CC (DRAIN) IMPLANT
GLIDEWIRE ADV .035X180CM (WIRE) ×1 IMPLANT
GLOVE BIO SURGEON STRL SZ7.5 (GLOVE) ×3 IMPLANT
GLOVE BIOGEL PI IND STRL 8 (GLOVE) ×3 IMPLANT
GOWN STRL REUS W/ TWL LRG LVL3 (GOWN DISPOSABLE) ×6 IMPLANT
GOWN STRL REUS W/ TWL XL LVL3 (GOWN DISPOSABLE) ×6 IMPLANT
INSERT FOGARTY SM (MISCELLANEOUS) IMPLANT
KIT BASIN OR (CUSTOM PROCEDURE TRAY) ×3 IMPLANT
KIT TURNOVER KIT B (KITS) ×3 IMPLANT
LOOP VASCLR MAXI BLUE 18IN ST (MISCELLANEOUS) IMPLANT
LOOP VESSEL MINI RED (MISCELLANEOUS) ×2 IMPLANT
LOOPS VASCLR MAXI BLUE 18IN ST (MISCELLANEOUS) ×4 IMPLANT
NS IRRIG 1000ML POUR BTL (IV SOLUTION) ×6 IMPLANT
PACK PERIPHERAL VASCULAR (CUSTOM PROCEDURE TRAY) ×3 IMPLANT
PAD ARMBOARD POSITIONER FOAM (MISCELLANEOUS) ×6 IMPLANT
SHEATH PINNACLE 5F 10CM (SHEATH) ×1 IMPLANT
SHEATH PINNACLE ST 6F 45CM (SHEATH) ×1 IMPLANT
SUT ETHILON 3 0 PS 1 (SUTURE) IMPLANT
SUT MNCRL AB 4-0 PS2 18 (SUTURE) ×9 IMPLANT
SUT PROLENE 5 0 C 1 24 (SUTURE) ×3 IMPLANT
SUT PROLENE 6 0 BV (SUTURE) ×2 IMPLANT
SUT PROLENE 7 0 BV 1 (SUTURE) IMPLANT
SUT SILK 2 0 PERMA HAND 18 BK (SUTURE) ×2 IMPLANT
SUT SILK 3-0 18XBRD TIE 12 (SUTURE) IMPLANT
SUT VIC AB 2-0 CT1 TAPERPNT 27 (SUTURE) ×4 IMPLANT
SUT VIC AB 3-0 SH 27X BRD (SUTURE) ×6 IMPLANT
TOWEL GREEN STERILE (TOWEL DISPOSABLE) ×3 IMPLANT
TRAY FOLEY MTR SLVR 16FR STAT (SET/KITS/TRAYS/PACK) ×3 IMPLANT
TUBING EXTENTION W/L.L. (IV SETS) ×3 IMPLANT
TUBING INJECTOR 48 (MISCELLANEOUS) ×1 IMPLANT
UNDERPAD 30X36 HEAVY ABSORB (UNDERPADS AND DIAPERS) ×3 IMPLANT
WATER STERILE IRR 1000ML POUR (IV SOLUTION) ×3 IMPLANT
WIRE BENTSON .035X145CM (WIRE) ×1 IMPLANT

## 2024-04-21 SURGICAL SUPPLY — 32 items
BAG COUNTER SPONGE SURGICOUNT (BAG) ×3 IMPLANT
CANISTER SUCTION 3000ML PPV (SUCTIONS) ×3 IMPLANT
CATH BEACON 5.038 65CM KMP-01 (CATHETERS) ×3 IMPLANT
CATH OMNI FLUSH .035X70CM (CATHETERS) ×3 IMPLANT
DERMABOND ADVANCED .7 DNX12 (GAUZE/BANDAGES/DRESSINGS) ×3 IMPLANT
DEVICE CLOSURE PERCLS PRGLD 6F (VASCULAR PRODUCTS) ×12 IMPLANT
DRSG TEGADERM 2-3/8X2-3/4 SM (GAUZE/BANDAGES/DRESSINGS) ×6 IMPLANT
ELECTRODE REM PT RTRN 9FT ADLT (ELECTROSURGICAL) ×6 IMPLANT
EXCLUDER EXT ENDO 20X4.5 15F (Endovascular Graft) IMPLANT
GAUZE SPONGE 2X2 8PLY STRL LF (GAUZE/BANDAGES/DRESSINGS) ×3 IMPLANT
GLIDEWIRE ADV .035X180CM (WIRE) IMPLANT
GLOVE BIO SURGEON STRL SZ7.5 (GLOVE) ×3 IMPLANT
GLOVE BIOGEL PI IND STRL 8 (GLOVE) ×3 IMPLANT
GOWN STRL REUS W/ TWL LRG LVL3 (GOWN DISPOSABLE) ×9 IMPLANT
GOWN STRL REUS W/ TWL XL LVL3 (GOWN DISPOSABLE) ×3 IMPLANT
GRAFT BALLN CATH 65CM (BALLOONS) ×3 IMPLANT
KIT BASIN OR (CUSTOM PROCEDURE TRAY) ×3 IMPLANT
KIT TURNOVER KIT B (KITS) ×3 IMPLANT
NS IRRIG 1000ML POUR BTL (IV SOLUTION) ×3 IMPLANT
PACK ENDOVASCULAR (PACKS) ×3 IMPLANT
PAD ARMBOARD POSITIONER FOAM (MISCELLANEOUS) ×6 IMPLANT
SET MICROPUNCTURE 5F STIFF (MISCELLANEOUS) ×3 IMPLANT
SHEATH BRITE TIP 8FR 23CM (SHEATH) ×3 IMPLANT
SHEATH PINNACLE 8F 10CM (SHEATH) ×3 IMPLANT
STOPCOCK MORSE 400PSI 3WAY (MISCELLANEOUS) ×3 IMPLANT
SUT MNCRL AB 4-0 PS2 18 (SUTURE) ×3 IMPLANT
SYR 20ML LL LF (SYRINGE) ×3 IMPLANT
TOWEL GREEN STERILE (TOWEL DISPOSABLE) ×3 IMPLANT
TRAY FOLEY MTR SLVR 16FR STAT (SET/KITS/TRAYS/PACK) ×3 IMPLANT
TUBING HIGH PRESSURE 120CM (CONNECTOR) ×3 IMPLANT
WIRE AMPLATZ SS-J .035X180CM (WIRE) ×6 IMPLANT
WIRE BENTSON .035X145CM (WIRE) ×6 IMPLANT

## 2024-04-21 NOTE — Anesthesia Procedure Notes (Signed)
 Arterial Line Insertion Start/End5/14/2025 9:15 AM, 04/21/2024 9:35 AM Performed by: Bernell Brigham, CRNA  Patient location: Pre-op. Preanesthetic checklist: patient identified, IV checked, site marked, risks and benefits discussed, surgical consent, monitors and equipment checked, pre-op evaluation, timeout performed and anesthesia consent Lidocaine  1% used for infiltration Right, radial was placed Catheter size: 20 G Hand hygiene performed  and maximum sterile barriers used   Attempts: 2 Procedure performed using ultrasound guided technique. Ultrasound Notes:anatomy identified, needle tip was noted to be adjacent to the nerve/plexus identified and no ultrasound evidence of intravascular and/or intraneural injection Following insertion, dressing applied and Biopatch. Post procedure assessment: normal and unchanged  Patient tolerated the procedure well with no immediate complications.

## 2024-04-21 NOTE — Progress Notes (Signed)
 Pt arrived to 4e08 from PACU. Received report from [place], Charity fundraiser. See assessment.

## 2024-04-21 NOTE — Anesthesia Procedure Notes (Signed)
 Procedure Name: Intubation Date/Time: 04/21/2024 11:09 AM  Performed by: Delma Fern, CRNAPre-anesthesia Checklist: Patient identified, Emergency Drugs available, Suction available and Patient being monitored Patient Re-evaluated:Patient Re-evaluated prior to induction Oxygen Delivery Method: Circle System Utilized Preoxygenation: Pre-oxygenation with 100% oxygen Induction Type: IV induction Ventilation: Mask ventilation without difficulty Laryngoscope Size: Miller and 2 Grade View: Grade I Tube type: Oral Tube size: 7.5 mm Number of attempts: 1 Airway Equipment and Method: Stylet and Oral airway Placement Confirmation: ETT inserted through vocal cords under direct vision, positive ETCO2 and breath sounds checked- equal and bilateral Secured at: 22 cm Tube secured with: Tape Dental Injury: Teeth and Oropharynx as per pre-operative assessment

## 2024-04-21 NOTE — Transfer of Care (Signed)
 Immediate Anesthesia Transfer of Care Note  Patient: Anthony Dallas Cimo Jr.  Procedure(s) Performed: EXPLORATION, ARTERY, FEMORAL  Patient Location: PACU  Anesthesia Type:MAC  Level of Consciousness: awake, alert , oriented, and patient cooperative  Airway & Oxygen Therapy: Patient Spontanous Breathing  Post-op Assessment: Report given to RN, Post -op Vital signs reviewed and stable, and Patient moving all extremities X 4  Post vital signs: Reviewed and stable  Last Vitals:  Vitals Value Taken Time  BP 126/64 04/21/24 1403  Temp 96.8 ax   Pulse 80 04/21/24 1408  Resp 16 04/21/24 1408  SpO2 96 % 04/21/24 1408  Vitals shown include unfiled device data.  Last Pain:  Vitals:   04/21/24 0808  TempSrc:   PainSc: 0-No pain         Complications: No notable events documented.

## 2024-04-21 NOTE — Anesthesia Postprocedure Evaluation (Signed)
 Anesthesia Post Note  Patient: Anthony Doorley Meeker Jr.  Procedure(s) Performed: INSERTION, ENDOVASCULAR STENT GRAFT, AORTA, ABDOMINAL ULTRASOUND GUIDANCE, FOR VASCULAR ACCESS (Right: Groin)     Patient location during evaluation: PACU Anesthesia Type: General Level of consciousness: awake and alert Pain management: pain level controlled Vital Signs Assessment: post-procedure vital signs reviewed and stable Respiratory status: spontaneous breathing, nonlabored ventilation, respiratory function stable and patient connected to nasal cannula oxygen Cardiovascular status: blood pressure returned to baseline and stable Postop Assessment: no apparent nausea or vomiting Anesthetic complications: no Comments: Episode of hypotension in PACU. Responded to volume replacement and vasopressor gtt. Returned to OR. RBC's on call to OR.   No notable events documented.  Last Vitals:  Vitals:   04/21/24 1430 04/21/24 1445  BP: 131/72 126/68  Pulse: 77 75  Resp: 15 15  Temp:    SpO2: 96% 96%    Last Pain:  Vitals:   04/21/24 1403  TempSrc:   PainSc: 0-No pain                 Willian Harrow

## 2024-04-21 NOTE — H&P (Signed)
 Patient name: Anthony Data Streck Jr.  MRN: [id number]        DOB: 1941/01/25            Sex: male   REASON FOR CONSULT: 2 month follow-up, 3 cm saccular infra-renal AAA   HPI: Anthony French Callender Jr. is a 83 y.o. male, with history of carotid stenosis, CAD, severe aortic stenosis s/p TAVR, bladder cancer, Guillain-Barr that presents for 2 month follow-up of a 3 cm saccular infrarenal abdominal aortic aneurysm.     On evaluation for TAVR found to have a 3 cm infra-renal saccular aneurysm from PAU on CTA on 01/02/2024.     He has since undergone TAVR successfully on 02/10/2024.  He has been cleared to undergo aneurysm repair 6 weeks after TAVR after 03/23/2024.   No new complaints today.       Past Medical History:  Diagnosis Date   Anemia     Bilateral carotid artery stenosis without cerebral infarction      vascular--- dr Fulton Job;   hx CVA;  last duplex  in epic 02-11-2023 left ICA 1-39%,  righrt ICA 40-59%,  bilateral ECA >50%   , pt to maintain on ASA 81mg  and statin   BPH (benign prostatic hypertrophy) with urinary obstruction      urologist--- dr Inga Manges   CAD S/P percutaneous coronary angioplasty 12/25/2023    s/p PTCA/DES x 1 proximal RCA   Coronary artery disease     Diverticulosis of colon     Glaucoma     Guillain Barr syndrome (HCC) 2001   History of bladder cancer 2004    s/p TURBT  and BCG treatment's   History of colon polyps     History of pleural effusion 2001    s/p  VATS w/ drainage   History of vertebral fracture 2001    2001--   T12   Hyperlipidemia     Hypothyroidism      followed by pcp   S/P TAVR (transcatheter aortic valve replacement) 02/10/2024    s/p TAVR with a 26 mm Edwards S3UR via the TF approach using Sentinel Cerbral Embolic Protection by Dr. Abel Hoe & Dr. Sherene Dilling   Severe aortic stenosis     Tinnitus     Varicose veins of both lower extremities                 Past Surgical History:  Procedure Laterality Date   CORONARY STENT INTERVENTION  N/A 12/25/2023    Procedure: CORONARY STENT INTERVENTION;  Surgeon: Odie Benne, MD;  Location: [facility];  Service: Cardiovascular;  Laterality: N/A;   CYSTO/  BILATERAL RETROGRADE PYLOGRAM/  RESECTION BLADDER TUMOR   07/30/2011    @[facility]   by dr Inga Manges   CYSTOSCOPY WITH BIOPSY N/A 01/12/2015    Procedure: CYSTOSCOPY WITH BLADDER  BIOPSY, FULGERATION;  Surgeon: Willye Harvey, MD;  Location[facility];  Service: Urology;  Laterality: N/A;   CYSTOSCOPY WITH BIOPSY Bilateral 05/13/2023    Procedure: CYSTOSCOPY,BLADDER  BIOPSY WITH FULGURATION, BILATERAL RETROGRADE PYELOGRAM;  Surgeon: Homero Luster, MD;  Location: [facility];  Service: Urology;  Laterality: Bilateral;  1 HR FOR CASE   INTRAOPERATIVE TRANSTHORACIC ECHOCARDIOGRAM N/A 02/10/2024    Procedure: ECHOCARDIOGRAM, TRANSTHORACIC;  Surgeon: Odie Benne, MD;  Location: [facility];  Service: Cardiovascular;  Laterality: N/A;   ORIF RIGHT DISTAL RADIUS FX   12/31/2007    @[facility]   by dr c. Lucienne Ryder   RIGHT HEART  CATH AND CORONARY ANGIOGRAPHY N/A 12/25/2023    Procedure: RIGHT HEART CATH AND CORONARY ANGIOGRAPHY;  Surgeon: Odie Benne, MD;  Location: [facility];  Service: Cardiovascular;  Laterality: N/A;   TONSILLECTOMY        child   TRANSURETHRAL RESECTION OF BLADDER TUMOR WITH MITOMYCIN -C   06/07/2003    @[facility]  by dr Inga Manges   VIDEO ASSISTED THORACOSCOPY (VATS)/EMPYEMA Right 05/01/2000    @[facility]   by dr Luna Salinas;  and Drainage of effusion               Family History  Problem Relation Age of Onset   Other Mother          varicose veins   Cancer Mother          Ovarian   Varicose Veins Mother     Nephrolithiasis Mother     COPD Father     Hyperlipidemia Father     Cancer Maternal Grandmother     Bipolar disorder Daughter     Healthy Daughter     Hypertension Sister     Healthy Daughter     Colon cancer Neg Hx            SOCIAL HISTORY: Social History          Socioeconomic History   Marital status: Married      Spouse name: Not on file   Number of children: 2   Years of education: Not on file   Highest education level: Not on file  Occupational History   Occupation: retired      Comment: self employed-printing  Tobacco Use   Smoking status: Former      Current packs/day: 0.00      Average packs/day: 1 pack/day for 20.0 years (20.0 ttl pk-yrs)      Types: Cigarettes      Start date: 1963      Quit date: 1983      Years since quitting: 42.2      Passive exposure: Never   Smokeless tobacco: Never  Vaping Use   Vaping status: Never Used  Substance and Sexual Activity   Alcohol use: Yes      Alcohol/week: 2.0 standard drinks of alcohol      Types: 1 Shots of liquor, 1 Standard drinks or equivalent per week   Drug use: No      Comment: Hemp gummies   Sexual activity: Not on file  Other Topics Concern   Not on file  Social History Narrative    Lives at home with wife and daughter.       Social Drivers of Acupuncturist Strain: Low Risk  (05/02/2020)    Overall Financial Resource Strain (CARDIA)     Difficulty of Paying Living Expenses: Not hard at all  Food Insecurity: No Food Insecurity (02/10/2024)    Hunger Vital Sign     Worried About Running Out of Food in the Last Year: Never true     Ran Out of Food in the Last Year: Never true  Transportation Needs: No Transportation Needs (02/10/2024)    PRAPARE - Therapist, art (Medical): No     Lack of Transportation (Non-Medical): No  Physical Activity: Sufficiently Active (05/02/2020)    Exercise Vital Sign     Days of Exercise per Week: 5 days     Minutes of Exercise per Session: 60 min  Stress: No Stress  Concern Present (05/02/2020)    [name] of Occupational Health - Occupational Stress Questionnaire     Feeling of Stress : Not at all  Social Connections: Socially Integrated (02/10/2024)    Social Connection and Isolation Panel  [NHANES]     Frequency of Communication with Friends and Family: More than three times a week     Frequency of Social Gatherings with Friends and Family: More than three times a week     Attends Religious Services: More than 4 times per year     Active Member of [employer] or Organizations: Yes     Attends Engineer, structural: More than 4 times per year     Marital Status: Married  Catering manager Violence: Not At Risk (02/10/2024)    Humiliation, Afraid, Rape, and Kick questionnaire     Fear of Current or Ex-Partner: No     Emotionally Abused: No     Physically Abused: No     Sexually Abused: No      Allergies       Allergies  Allergen Reactions   Bcg Live Other (See Comments)      Treatment pt had- shakes   Septra [Bactrim] Other (See Comments)      Muscle weakness, numbness   Sulfa Antibiotics Other (See Comments)      Severe weakness   Pertussis Vaccines        History of Gillian barrie     Iodinated Contrast Media Rash              Current Outpatient Medications  Medication Sig Dispense Refill   acetaminophen  (TYLENOL ) 650 MG CR tablet Take 650 mg by mouth every 8 (eight) hours as needed for pain.       amoxicillin -clavulanate (AUGMENTIN ) 875-125 MG tablet Take 1 tablet by mouth 2 (two) times daily. (Patient not taking: Reported on 03/08/2024) 14 tablet 0   atorvastatin  (LIPITOR) 80 MG tablet Take 1 tablet (80 mg total) by mouth daily. 90 tablet 1   BAYER ASPIRIN  PO Take 81 mg by mouth daily.       benzonatate  (TESSALON ) 100 MG capsule Take 1 capsule (100 mg total) by mouth 3 (three) times daily as needed. (Patient not taking: Reported on 03/08/2024) 30 capsule 0   brimonidine (ALPHAGAN) 0.2 % ophthalmic solution Place 1 drop into the right eye 3 (three) times daily.       calcium  elemental as carbonate (TUMS ULTRA 1000) 400 MG chewable tablet Chew 1,000 mg by mouth at bedtime.       cholecalciferol (VITAMIN D3) 25 MCG (1000 UNIT) tablet Take 1,000 Units by mouth  daily.       clopidogrel  (PLAVIX ) 75 MG tablet Take 1 tablet (75 mg total) by mouth daily. 90 tablet 1   Coenzyme Q10 (COQ10) 400 MG CAPS Take 400 mg by mouth daily.       CRANBERRY PO Take 650 mg by mouth daily.       diphenhydrAMINE  (BENADRYL ) 25 MG tablet Take 25 mg by mouth every 6 (six) hours as needed for itching.       dorzolamide -timolol  (COSOPT ) 22.3-6.8 MG/ML ophthalmic solution Place 1 drop into the right eye 2 (two) times daily.       ferrous sulfate  325 (65 FE) MG tablet Take 325 mg by mouth daily.       hyoscyamine  (LEVSIN SL) 0.125 MG SL tablet DISSOLVE 1 TABLET IN MOUTH EVERY 8 HOURS AS NEEDED 120 tablet 2   levothyroxine  (  SYNTHROID ) 137 MCG tablet Take 1 tablet (137 mcg total) by mouth daily before breakfast. 90 tablet 1   loratadine  (CLARITIN ) 10 MG tablet Take 10 mg by mouth daily.       Magnesium  400 MG TABS Take 400 mg by mouth daily.       Multiple Vitamin (MULTIVITAMIN WITH MINERALS) TABS tablet Take 1 tablet by mouth daily. Centrum silver 50+       Multiple Vitamins-Minerals (HAIR SKIN NAILS PO) Take 1 tablet by mouth daily.       Netarsudil-Latanoprost (ROCKLATAN) 0.02-0.005 % SOLN Place 1 drop into both eyes at bedtime.       NON FORMULARY Take 15 mg by mouth at bedtime. CBD Gummy hemp isolate       NON FORMULARY Take 3 tablets by mouth 3 (three) times daily as needed (ear ring). Flavonoid       Omega-3 Fatty Acids (FISH OIL) 1000 MG CAPS Take 1,000 mg by mouth daily.       predniSONE  (STERAPRED UNI-PAK 21 TAB) 10 MG (21) TBPK tablet Take 6 tablets (60 mg total) by mouth daily for 1 day, THEN 5 tablets (50 mg total) daily for 1 day, THEN 4 tablets (40 mg total) daily for 1 day, THEN 3 tablets (30 mg total) daily for 1 day, THEN 2 tablets (20 mg total) daily for 1 day, THEN 1 tablet (10 mg total) daily for 1 day. 21 tablet 0   sodium chloride  (OCEAN) 0.65 % SOLN nasal spray Place 1 spray into both nostrils daily. May continue to use throughout the day as needed for  congestion       triamcinolone  (KENALOG ) 0.025 % ointment Apply 1 Application topically 2 (two) times daily. 30 g 0      No current facility-administered medications for this visit.        REVIEW OF SYSTEMS:  [X]  denotes positive finding, [ ]  denotes negative finding Cardiac   Comments:  Chest pain or chest pressure:      Shortness of breath upon exertion:      Short of breath when lying flat:      Irregular heart rhythm:             Vascular      Pain in calf, thigh, or hip brought on by ambulation:      Pain in feet at night that wakes you up from your sleep:       Blood clot in your veins:      Leg swelling:              Pulmonary      Oxygen at home:      Productive cough:       Wheezing:              Neurologic      Sudden weakness in arms or legs:       Sudden numbness in arms or legs:       Sudden onset of difficulty speaking or slurred speech:      Temporary loss of vision in one eye:       Problems with dizziness:              Gastrointestinal      Blood in stool:       Vomited blood:              Genitourinary      Burning when urinating:       Blood  in urine:             Psychiatric      Major depression:              Hematologic      Bleeding problems:      Problems with blood clotting too easily:             Skin      Rashes or ulcers:             Constitutional      Fever or chills:          PHYSICAL EXAM: There were no vitals filed for this visit.     GENERAL: The patient is a well-nourished male, in no acute distress. The vital signs are documented above. CARDIAC: There is a regular rate and rhythm.  VASCULAR:  Bilateral femoral pulses palpable Bilateral PT pulses palpable PULMONARY: No respiratory distress. ABDOMEN: Soft and non-tender. MUSCULOSKELETAL: There are no major deformities or cyanosis. NEUROLOGIC: No focal weakness or paresthesias are detected. SKIN: There are no ulcers or rashes noted. PSYCHIATRIC: The patient has a  normal affect.   DATA:    CTA reviewed from 01/02/2024 with 3 cm saccular infrarenal abdominal aortic aneurysm from penetrating aortic ulcer   Assessment/Plan:   83 y.o. male, with history of carotid stenosis, CAD, severe aortic stenosis s/p TAVR, bladder cancer, Guillain-Barr that presents for 2 month follow-up of a 3 cm saccular infrarenal abdominal aortic aneurysm.     On evaluation for TAVR found to have a 3 cm infra-renal saccular aneurysm from PAU on CTA on 01/02/2024.     He has since undergone TAVR successfully on 02/10/2024.He has been cleared to undergo aneurysm repair 6 weeks after TAVR after 03/23/2024. Again discussed my recommendation to repair his aneurysm with stent graft given saccular morphology. His native aorta is relatively small and only measures about 13 mm distal to the aneurysm.  I think we can repair his infrarenal aneurysm with likely two 20 mm aortic cuffs from a transfemoral approach.  He is leaning toward surgery in early May and I will have my office contact to schedule.  He does need to stay on aspirin  Plavix  through surgery which I discussed is okay given previous PCI and needs to be on dual antiplatelet therapy until July.     Young Hensen, MD [facility] Office: [phone]

## 2024-04-21 NOTE — Anesthesia Preprocedure Evaluation (Signed)
 Anesthesia Evaluation  Patient identified by MRN, date of birth, ID band Patient awake    Reviewed: Allergy & Precautions, NPO status , Patient's Chart, lab work & pertinent test results  Airway Mallampati: II  TM Distance: >3 FB Neck ROM: Full    Dental  (+) Teeth Intact, Dental Advisory Given   Pulmonary former smoker   breath sounds clear to auscultation       Cardiovascular + CAD, + Cardiac Stents and + Peripheral Vascular Disease  + Valvular Problems/Murmurs AS  Rhythm:Regular Rate:Normal  - s/p TAVR   Neuro/Psych  Neuromuscular disease  negative psych ROS   GI/Hepatic negative GI ROS, Neg liver ROS,,,  Endo/Other  Hypothyroidism    Renal/GU negative Renal ROS     Musculoskeletal negative musculoskeletal ROS (+)    Abdominal   Peds  Hematology  (+) Blood dyscrasia, anemia   Anesthesia Other Findings   Reproductive/Obstetrics                             Anesthesia Physical Anesthesia Plan  ASA: 3 and emergent  Anesthesia Plan: MAC   Post-op Pain Management:    Induction: Intravenous  PONV Risk Score and Plan: 3 and Ondansetron  and Treatment may vary due to age or medical condition  Airway Management Planned: Natural Airway and Simple Face Mask  Additional Equipment: None  Intra-op Plan:   Post-operative Plan:   Informed Consent: I have reviewed the patients History and Physical, chart, labs and discussed the procedure including the risks, benefits and alternatives for the proposed anesthesia with the patient or authorized representative who has indicated his/her understanding and acceptance.     Only emergency history available  Plan Discussed with: CRNA  Anesthesia Plan Comments: (Istat in PACU: hgb 9.5. Blood ordered to be in room.   PAT note by Rudy Costain, PA-C:  83 year old male follows with cardiology for history of carotid artery disease (40 to 59% RICA and  1 to 39% LICA by duplex 09/2023), CAD s/p PCI/DES to pRCA (12/25/23), AAA and severe aortic stenosis s/p TAVR (02/10/24). He was found to have severe aortic stenosis by echo in June 2024. Cardiac cath January 2025 with severe RCA stenosis treated with a drug eluting stent. CTA with 3 cm infrarenal saccular aneurysm with associated penetrating ulcer. He was seen in [facility] by Dr. Fulton Job and cleared to proceed with TAVR with likely future aortic repair. He underwent TAVR on 02/10/24.  Last seen in follow-up by Jeronimo Moors, PA-C on 03/08/2024.  Per note, echo at that time showed EF of 70%, normally functioning TAVR with a mean gradient of 9.4 mmHg and no PVL as well as a small basal septal VSD (previously noted, not felt to need any intervention).  He was noted to be euvolemic, back to playing golf without limitation.  Upcoming AAA repair also discussed.  Per note, "Discussed case with Dr. Rolm Clos and Dr. Abel Hoe who feel he is cleared from a cardiac standpoint for aneurysm repair at least 6 weeks out from TAVR (after 03/23/24) if it can be done while on dual antiplatelet therapy. If not, can wait until he comes off Plavix  in July."  Other pertinent history includes former smoker (20 pack years, quit 1983), remote history of pleural effusion s/p VATS 2001, hypothyroidism, history of Guillain-Barr syndrome 2001.  Preop labs reviewed, WNL.  EKG 02/20/2024: Sinus rhythm with occasional Premature ventricular complexes and Premature atrial complexes with ventricular escape complexes.  Rate  69. Left anterior fascicular block  TTE 03/08/2024: 1. Possible small basal setpal VSD as previously noted. Left ventricular  ejection fraction, by estimation, is 70 to 75%. Left ventricular ejection  fraction by PLAX is 73 %. The left ventricle has hyperdynamic function.  The left ventricle has no regional  wall motion abnormalities. Left ventricular diastolic parameters are  consistent with Grade I diastolic dysfunction (impaired  relaxation).  Elevated left ventricular end-diastolic pressure. The E/e' is 15.  2. Right ventricular systolic function is normal. The right ventricular  size is normal. There is normal pulmonary artery systolic pressure.  3. Left atrial size was moderately dilated.  4. The mitral valve is grossly normal. No evidence of mitral valve  regurgitation.  5. The aortic valve has been repaired/replaced. Aortic valve  regurgitation is trivial. There is a 26 mm Sapien prosthetic (TAVR) valve  present in the aortic position. Echo findings are consistent with  perivalvular leak of the aortic prosthesis. Aortic  valve area, by VTI measures 1.69 cm. Aortic valve mean gradient measures  9.4 mmHg. Aortic valve Vmax measures 2.07 m/s. Peak gradient 17.1 mmHg, DI  is 0.54.  6. The inferior vena cava is normal in size with greater than 50%  respiratory variability, suggesting right atrial pressure of 3 mmHg.   Comparison(s): Changes from prior study are noted. 02/11/2024: LVEF 70-75%,  TAVR MG 11 mmHg.    Cath and PCI 12/25/2023:    Prox RCA lesion is 99% stenosed.   Mid Cx to Dist Cx lesion is 30% stenosed.   Mid LM to Dist LM lesion is 30% stenosed.   Prox LAD to Mid LAD lesion is 30% stenosed.   Mid RCA lesion is 30% stenosed.   A drug-eluting stent was successfully placed using a STENT ONYX FRONTIER 2.75X15.   Post intervention, there is a 0% residual stenosis.  Severe stenosis in the proximal RCA (co-dominant vessel) Successful PTCA/DES x 1 proximal RCA Mild non-obstructive disease in the distal left main, proximal LAD and mid Circumflex Normal right heart pressures.  Severe aortic stenosis by echo. I did not cross the valve today.   Recommendations: Will continue DAPT with ASA and Plavix . Same day post PCI discharge (6 hours). Continue workup for TAVR.     )        Anesthesia Quick Evaluation

## 2024-04-21 NOTE — Op Note (Signed)
 Date: Apr 21, 2024  Preoperative diagnosis: 3 cm saccular infrarenal aortic aneurysm from penetrating aortic ulcer  Postoperative diagnosis: Same  Procedure: 1.  Ultrasound-guided access right common femoral artery with Perclose closure 2.  Aortogram with catheter selection of aorta 3.  Treatment of infrarenal aortic aneurysm with aortic tube graft (20 mm x 4.5 cm Gore cuff x 2)  Surgeon: Dr. Young Hensen, MD  Assistant: Dr. Bernadette Brewster, MD  Indications: 83 year old male that has been evaluated for TAVR and ultimately found to have penetrating aortic ulcer of his infrarenal aorta with saccular aneurysm.  He presents for stent graft repair after risk benefits discussed.  An assistant was needed given the complexity of the case.  Findings: Ultrasound-guided access of right common femoral artery with delivery of Perclose closure x 2.  Aortogram identified the renal arteries as well as the aortic bifurcation.  The infrarenal aortic aneurysm was then identified and treated with 2 cuffs using 20 mm x 4.5 cm Gore cuffs.  This was treated with a MOB balloon.  No filling of the aneurysm at completion and widely patent stent.  Anesthesia: General  Details: Patient was taken to the operating room after informed consent was obtained.  Placed on operative table in the supine position.  General endotracheal anesthesia was induced.  The abdominal wall both groins were prepped and draped in standard sterile fashion.  Antibiotics were given and timeout performed.  Initially used ultrasound to evaluate the right common femoral artery, it was patent, an image was saved.  This was accessed with a micro access needle placed a microwire and then made a stab incision with 11 blade scalpel and hemostat.  I then placed a micro sheath and then initially used a Bentson wire and had to exchanged for a Glidewire due to some iliac calcification.  I then dilated over this with an 8 French dilator and placed  Perclose closure devices at 10:00 and 2:00 in the right common femoral artery.  We then gave 100 units/kg IV heparin .  A 16 French Gore dry seal sheath was placed in the right common femoral artery.  A pigtail catheter was then advanced over a buddy wire and we got a infrarenal aortogram with catheter selection of the aorta.  Both renals were identified as well as the aortic bifurcation.  We then selected 20 mm Gore cuffs that were then deployed below the renals and then we deployed a second cuff to get adequate coverage of the saccular aneurysm.  We did a aortogram and elected to treat this with a MOB balloon carefully.  Final imaging showed widely patent infrarenal stents with patent renals and a patent bifurcation.  Wires and catheters were removed and we tied down Perclose closure in the right groin with good hemostasis and held pressure.  Taken to recovery in stable condition.  Complication: None  Condition: Stable  Young Hensen, MD [facility] [place] Office: [phone]   Young Hensen

## 2024-04-21 NOTE — Anesthesia Postprocedure Evaluation (Signed)
 Anesthesia Post Note  Patient: Anthony Capel Trias Jr.  Procedure(s) Performed: AORTOGRAM (Groin) ULTRASOUND GUIDANCE, FOR VASCULAR ACCESS (Groin)     Patient location during evaluation: PACU Anesthesia Type: MAC Level of consciousness: awake and alert Pain management: pain level controlled Vital Signs Assessment: post-procedure vital signs reviewed and stable Respiratory status: spontaneous breathing, nonlabored ventilation, respiratory function stable and patient connected to nasal cannula oxygen Cardiovascular status: stable and blood pressure returned to baseline Postop Assessment: no apparent nausea or vomiting Anesthetic complications: no Comments: No acute bleeding noted. Vasopressor gtt stopped. No transfusion required. Cardiology consulted for heart conduction assessment.   No notable events documented.  Last Vitals:  Vitals:   04/21/24 1430 04/21/24 1445  BP: 131/72 126/68  Pulse: 77 75  Resp: 15 15  Temp:    SpO2: 96% 96%    Last Pain:  Vitals:   04/21/24 1403  TempSrc:   PainSc: 0-No pain                 Willian Harrow

## 2024-04-21 NOTE — Op Note (Signed)
 Date: Apr 21, 2024  Preoperative diagnosis: Hypotensive after aortic stent graft with concern for retroperitoneal bleed  Postoperative diagnosis: Same  Procedure: 1.  Ultrasound-guided access left common femoral artery 2.  Aortogram with catheter selection of aorta 3.  Right iliac arteriogram including angiogram of the right femoral access site  Surgeon: Dr. Young Hensen, MD  Assistant: Dr. Bernadette Brewster, MD  Indications: 83 year old male that underwent right transfemoral access for aortic stenting of a saccular aneurysm earlier today with good technical results.  He had profound hypotension postoperatively and ultimately we recommended emergent takeback to the OR to ensure there was no complication from right femoral access site and/or RP bleed.  Findings: Ultrasound-guided access left common femoral artery.  Aortogram was performed that showed widely patent infrarenal aortic stents with no active blush.  The right iliac artery was widely patent with no blush.  The right common femoral access site was hemostatic and widely patent.  No bleeding source identified.  Anesthesia: General  Details: Patient was taken to the operating room emergently and placed back on the OR table in supine position.  We elected to leave him under monitored anesthesia care and avoid general anesthesia if possible.  His bilateral groins were then prepped and draped in standard sterile fashion.  Dr. Rosalva Comber then accessed the left common femoral artery with micro access needle placed a microwire and a microsheath.  I then used a Bentson wire and placed a 5 [place] sheath.  We then got a aortogram with catheter selection of the aorta.  Aortic stents were widely patent with no signs of active bleeding.  We then used a Glidewire advantage to cross the aortic bifurcation and advanced our Omni Flush catheter into the right iliac artery.  We then got a right lower extremity arteriogram looking at the iliac artery as  well as the right common femoral access site.  There was no active extravasation identified.  The arteries were widely patent.  Mynx closure was deployed in the left groin.  Hemodynamics improved and taken to recovery in stable condition.  Complication: None   Condition: Stable  Young Hensen, MD [facility] [place] Office: [phone]   Young Hensen

## 2024-04-21 NOTE — Progress Notes (Signed)
 Patient underwent right transfemoral access for aortic stenting.  Hypotensive in the recovery room.  Right groin looks okay on exam.  Concern for retroperitoneal bleed.  Will return to the OR for right lower extremity angiogram.  Have updated the family.  May require groin cutdown.  Young Hensen, MD [facility] [place] Office: [phone]   Young Hensen

## 2024-04-21 NOTE — Progress Notes (Signed)
 Patient urgently taken back to OR from PACU. Dr. Fulton Job, Cr. Rosalva Comber, Dr. Otis Blocker at bedside.

## 2024-04-21 NOTE — Transfer of Care (Signed)
 Immediate Anesthesia Transfer of Care Note  Patient: Anthony Dallas Morino Jr.  Procedure(s) Performed: INSERTION, ENDOVASCULAR STENT GRAFT, AORTA, ABDOMINAL ULTRASOUND GUIDANCE, FOR VASCULAR ACCESS (Right: Groin)  Patient Location: PACU  Anesthesia Type:General  Level of Consciousness: awake, alert , and patient cooperative  Airway & Oxygen Therapy: Patient Spontanous Breathing  Post-op Assessment: Report given to RN, Post -op Vital signs reviewed and stable, and Patient moving all extremities X 4  Post vital signs: Reviewed and stable  Last Vitals:  Vitals Value Taken Time  BP 134/77 04/21/24 1238  Temp WNL   Pulse 70 04/21/24 1244  Resp 14 04/21/24 1244  SpO2 97 % 04/21/24 1244  Vitals shown include unfiled device data.  Last Pain:  Vitals:   04/21/24 0808  TempSrc:   PainSc: 0-No pain         Complications: No notable events documented.

## 2024-04-22 ENCOUNTER — Encounter (HOSPITAL_COMMUNITY): Payer: Self-pay | Admitting: Vascular Surgery

## 2024-04-22 ENCOUNTER — Other Ambulatory Visit (HOSPITAL_COMMUNITY): Payer: Self-pay

## 2024-04-22 LAB — CBC
HCT: 31.4 % — ABNORMAL LOW (ref 39.0–52.0)
Hemoglobin: 10.3 g/dL — ABNORMAL LOW (ref 13.0–17.0)
MCH: 29.3 pg (ref 26.0–34.0)
MCHC: 32.8 g/dL (ref 30.0–36.0)
MCV: 89.5 fL (ref 80.0–100.0)
Platelets: 135 10*3/uL — ABNORMAL LOW (ref 150–400)
RBC: 3.51 MIL/uL — ABNORMAL LOW (ref 4.22–5.81)
RDW: 14.1 % (ref 11.5–15.5)
WBC: 7.6 10*3/uL (ref 4.0–10.5)
nRBC: 0 % (ref 0.0–0.2)

## 2024-04-22 LAB — BASIC METABOLIC PANEL WITH GFR
Anion gap: 9 (ref 5–15)
BUN: 9 mg/dL (ref 8–23)
CO2: 23 mmol/L (ref 22–32)
Calcium: 8.1 mg/dL — ABNORMAL LOW (ref 8.9–10.3)
Chloride: 104 mmol/L (ref 98–111)
Creatinine, Ser: 0.94 mg/dL (ref 0.61–1.24)
GFR, Estimated: >60 mL/min (ref >60)
Glucose, Bld: 106 mg/dL — ABNORMAL HIGH (ref 70–99)
Potassium: 3.7 mmol/L (ref 3.5–5.1)
Sodium: 136 mmol/L (ref 135–145)

## 2024-04-22 LAB — POCT ACTIVATED CLOTTING TIME: Activated Clotting Time: 291 s

## 2024-04-22 MED ORDER — OXYCODONE-ACETAMINOPHEN 5-325 MG PO TABS
1.0000 | ORAL_TABLET | Freq: Four times a day (QID) | ORAL | 0 refills | Status: DC | PRN
  Filled 2024-04-22: qty 15, 4d supply, fill #0

## 2024-04-22 NOTE — Progress Notes (Signed)
 Pt ambulated earlier with physical therapy, see note.  Now discharging home with wife.  All instructions given and reviewed, all questions answered.  Will stop at [facility] for meds on way out

## 2024-04-22 NOTE — TOC Transition Note (Signed)
 Transition of Care [facility]) - Discharge Note Sherin Dingwall RN, BSN Transitions of Care Unit 4E- RN Case Manager See Treatment Team for direct phone #   Patient Details  Name: Anthony Dallas Garciaperez Jr. MRN: [id number] Date of Birth: Feb 10, 1941  Transition of Care [facility] CM/SW Contact:  Rox Cope, RN Phone Number: 04/22/2024, 12:32 PM   Clinical Narrative:    Pt stable for transition home today, CM notified by [facility] liaison that [facility] office made referral for [facility] needs. Noted PT recs for no F/U. Due to age [facility] liaison to follow up with pt on return home for any [facility] needs.   Family to transport home. TOC pharmacy to fill medications.   No further TOC needs or interventions noted.    Final next level of care: Home w Home Health Services Barriers to Discharge: No Barriers Identified   Patient Goals and CMS Choice Patient states their goals for this hospitalization and ongoing recovery are:: return home   Choice offered to / list presented to : NA      Discharge Placement               Home w/ [facility]        Discharge Plan and Services Additional resources added to the After Visit Summary for     Discharge Planning Services: CM Consult Post Acute Care Choice: NA          DME Arranged: N/A DME Agency: NA         HH Agency: [facility] Date HH Agency Contacted: 04/22/24 Time HH Agency Contacted: 1000 Representative spoke with at [facility] Agency: Glenora Laos  Social Drivers of Health (SDOH) Interventions SDOH Screenings   Food Insecurity: No Food Insecurity (02/10/2024)  Housing: Unknown (04/16/2024)   Received from [facility]  Transportation Needs: No Transportation Needs (02/10/2024)  Utilities: Not At Risk (02/10/2024)  Alcohol Screen: Low Risk  (05/02/2020)  Depression (PHQ2-9): Low Risk  (03/19/2024)  Financial Resource Strain: Low Risk  (05/02/2020)  Physical Activity: Sufficiently Active (05/02/2020)  Social Connections:  Socially Integrated (02/10/2024)  Stress: No Stress Concern Present (05/02/2020)  Tobacco Use: Medium Risk (04/21/2024)     Readmission Risk Interventions    04/22/2024   12:32 PM  Readmission Risk Prevention Plan  Transportation Screening Complete  Home Care Screening Complete  Medication Review (RN CM) Complete

## 2024-04-22 NOTE — Evaluation (Signed)
 Physical Therapy Brief Evaluation and Discharge Note Patient Details Name: Anthony Dallas Balistreri Jr. MRN: [id number] DOB: 1941/01/13 Today's Date: 04/22/2024   History of Present Illness  Pt is a 83 y.o. 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 Significant PMH: CAD, severe aortic stenosis s/p TAVR, bladder CA, Guillain-Barre.  Clinical Impression  Patient evaluated by Physical Therapy with no further acute PT needs identified. Pt reporting R groin pain, but is overall mobilizing well. Pt ambulating 150 ft with a RW and negotiated 3 steps with a railing without physical assist. Additionally, ambulated limited room distance with no assistive device with no gross instability. Education provided regarding generalized walking program, activity restrictions and progression, and use of DME as needed for pain control and management. All education has been completed and the patient has no further questions. No follow-up Physical Therapy or equipment needs. PT is signing off. Thank you for this referral.      PT Assessment Patient does not need any further PT services  Assistance Needed at Discharge  PRN    Equipment Recommendations None recommended by PT  Recommendations for Other Services       Precautions/Restrictions Precautions Precautions: None Restrictions Weight Bearing Restrictions Per Provider Order: No        Mobility  Bed Mobility   Supine/Sidelying to sit: Supervision   General bed mobility comments: HOB flat, increased time, use of rail  Transfers Overall transfer level: Needs assistance Equipment used: None Transfers: Sit to/from Stand Sit to Stand: Supervision                Ambulation/Gait Ambulation/Gait assistance: Modified independent (Device/Increase time) Gait Distance (Feet): 150 Feet Assistive device: Rolling walker (2 wheels),  None Gait Pattern/deviations: Step-through pattern, Decreased stride length   General Gait Details: Ambulating with and without RW with slow and steady pace. Cues for RW use  Home Activity Instructions    Stairs Stairs: Yes Stairs assistance: Supervision Stair Management: One rail Right Number of Stairs: 3 General stair comments: cues for sequencing  Modified Rankin (Stroke Patients Only)        Balance Overall balance assessment: Mild deficits observed, not formally tested                        Pertinent Vitals/Pain PT - Brief Vital Signs All Vital Signs Stable: Yes Pain Assessment Pain Assessment: Faces Faces Pain Scale: Hurts even more Pain Location: R groin Pain Descriptors / Indicators: Grimacing, Guarding Pain Intervention(s): Limited activity within patient's tolerance, Monitored during session     Home Living Family/patient expects to be discharged to:: Private residence Living Arrangements: Spouse/significant other;Children (spouse, daughter) Available Help at Discharge: Family Home Environment: Stairs to enter  [place] of Steps: 2 Home Equipment: Agricultural consultant (2 wheels);Cane - single point        Prior Function Level of Independence: Independent Comments: Walks [place] golf [place], uses treadmill    UE/LE Assessment   UE ROM/Strength/Tone/Coordination: WFL    LE ROM/Strength/Tone/Coordination: [facility]      Communication   Communication Communication: No apparent difficulties     Cognition Overall Cognitive Status: Appears within functional limits for tasks assessed/performed       General Comments      Exercises     Assessment/Plan    PT Problem List         PT Visit Diagnosis Pain;Difficulty  in walking, not elsewhere classified (R26.2)    No Skilled PT All education completed;Patient will have necessary level of assist by caregiver at discharge;Patient is supervision for all activity/mobility    Co-evaluation                AMPAC 6 Clicks Help needed turning from your back to your side while in a flat bed without using bedrails?: None Help needed moving from lying on your back to sitting on the side of a flat bed without using bedrails?: A Little Help needed moving to and from a bed to a chair (including a wheelchair)?: A Little Help needed standing up from a chair using your arms (e.g., wheelchair or bedside chair)?: A Little Help needed to walk in hospital room?: None Help needed climbing 3-5 steps with a railing? : A Little 6 Click Score: 20      End of Session   Activity Tolerance: Patient tolerated treatment well Patient left: in bed;with call bell/phone within reach;with nursing/sitter in room;with family/visitor present Nurse Communication: Mobility status PT Visit Diagnosis: Pain;Difficulty in walking, not elsewhere classified (R26.2) Pain - Right/Left: Right Pain - part of body:  (groin)     Time: 6578-4696 PT Time Calculation (min) (ACUTE ONLY): 23 min  Charges:   PT Evaluation $PT Eval Low Complexity: 1 Low PT Treatments $Gait Training: 8-22 mins    Verdia Glad, PT, DPT Acute Rehabilitation Services Office [phone]   Claria Crofts  04/22/2024, 10:24 AM

## 2024-04-22 NOTE — Evaluation (Incomplete)
{  IP PT ALL EVAL NOTES:[id number]}

## 2024-04-22 NOTE — Plan of Care (Signed)

## 2024-04-22 NOTE — Discharge Instructions (Signed)
   [facility]   Discharge Instructions  Endovascular Aortic Aneurysm Repair  Please refer to the following instructions for your post-procedure care. Your surgeon or Physician Assistant will discuss any changes with you.  Activity  You are encouraged to walk as much as you can. You can slowly return to normal activities but must avoid strenuous activity and heavy lifting until your doctor tells you it's OK. Avoid activities such as vacuuming or swinging a gold club. It is normal to feel tired for several weeks after your surgery. Do not drive until your doctor gives the OK and you are no longer taking prescription pain medications. It is also normal to have difficulty with sleep habits, eating, and bowel movements after surgery. These will go away with time.  Bathing/Showering  You may shower after you go home. If you have an incision, do not soak in a bathtub, hot tub, or swim until the incision heals completely.  Incision Care  Shower every day. Clean your incision with mild soap and water. Pat the area dry with a clean towel. You do not need a bandage unless otherwise instructed. Do not apply any ointments or creams to your incision. If you clothing is irritating, you may cover your incision with a dry gauze pad.  Diet  Resume your normal diet. There are no special food restrictions following this procedure. A low fat/low cholesterol diet is recommended for all patients with vascular disease. In order to heal from your surgery, it is CRITICAL to get adequate nutrition. Your body requires vitamins, minerals, and protein. Vegetables are the best source of vitamins and minerals. Vegetables also provide the perfect balance of protein. Processed food has little nutritional value, so try to avoid this.  Medications  Resume taking all of your medications unless your doctor or nurse practitioner tells you not to. If your incision is causing pain, you may take  over-the-counter pain relievers such as acetaminophen (Tylenol). If you were prescribed a stronger pain medication, please be aware these medications can cause nausea and constipation. Prevent nausea by taking the medication with a snack or meal. Avoid constipation by drinking plenty of fluids and eating foods with a high amount of fiber, such as fruits, vegetables, and grains. Do not take Tylenol if you are taking prescription pain medications.   Follow up  [facility] office will schedule a follow-up appointment with a C.T. scan 3-4 weeks after your surgery.  Please call us immediately for any of the following conditions  Severe or worsening pain in your legs or feet or in your abdomen back or chest. Increased pain, redness, drainage (pus) from your incision sit. Increased abdominal pain, bloating, nausea, vomiting or persistent diarrhea. Fever of 101 degrees or higher. Swelling in your leg (s),  Reduce your risk of vascular disease  Stop smoking. If you would like help call [facility] at 1-800-QUIT-NOW 819-462-0294) or [place] at [phone]. Manage your cholesterol Maintain a desired weight Control your diabetes Keep your blood pressure down  If you have questions, please call the office at [phone].

## 2024-04-22 NOTE — Progress Notes (Addendum)
  Progress Note    04/22/2024 8:02 AM 1 Day Post-Op  Subjective:  no complaints this morning.  Wants to go home.   Vitals:   04/22/24 0039 04/22/24 0322  BP: (!) 115/51 125/60  Pulse: 63 66  Resp: 17 18  Temp: 97.6 F (36.4 C) 98.1 F (36.7 C)  SpO2: 98% 96%   Physical Exam: Lungs:  non labored Incisions:  groins soft without hematoma Extremities:  palpable DP pulses Abdomen:  soft Neurologic: A&O  CBC    Component Value Date/Time   WBC 7.6 04/22/2024 0508   RBC 3.51 (L) 04/22/2024 0508   HGB 10.3 (L) 04/22/2024 0508   HGB 13.7 12/30/2023 1002   HCT 31.4 (L) 04/22/2024 0508   HCT 42.5 12/30/2023 1002   PLT 135 (L) 04/22/2024 0508   PLT 222 12/30/2023 1002   MCV 89.5 04/22/2024 0508   MCV 91 12/30/2023 1002   MCH 29.3 04/22/2024 0508   MCHC 32.8 04/22/2024 0508   RDW 14.1 04/22/2024 0508   RDW 12.9 12/30/2023 1002   LYMPHSABS 1.4 12/30/2023 1002   EOSABS 0.2 12/30/2023 1002   BASOSABS 0.1 12/30/2023 1002    BMET    Component Value Date/Time   NA 136 04/22/2024 0508   NA 144 12/30/2023 1002   K 3.7 04/22/2024 0508   CL 104 04/22/2024 0508   CO2 23 04/22/2024 0508   GLUCOSE 106 (H) 04/22/2024 0508   BUN 9 04/22/2024 0508   BUN 16 12/30/2023 1002   CREATININE 0.94 04/22/2024 0508   CREATININE 1.08 03/24/2013 0832   CALCIUM  8.1 (L) 04/22/2024 0508   GFRNONAA >60 04/22/2024 0508   GFRAA 72 06/13/2020 0823    INR    Component Value Date/Time   INR 1.2 04/21/2024 1432   INR 1.0 05/01/2023 0815     Intake/Output Summary (Last 24 hours) at 04/22/2024 0802 Last data filed at 04/22/2024 0451 Gross per 24 hour  Intake 1768.77 ml  Output 2295 ml  Net -526.23 ml     Assessment/Plan:  83 y.o. male is s/p EVAR with subsequent aortogram due to concern for RP bleeding 1 Day Post-Op   BLE well perfused with palpable DP pulses Groin access sites without hematoma H&H stable this morning; patient is normotensive OOB and ambulate this morning Home if ok  with Dr. Fulton Job; office will arrange CTA a/p in 1 month   Cordie Deters, PA-C [facility] [phone] 04/22/2024 8:02 AM   I have seen and evaluated the patient. I agree with the PA note as documented above.  Postop day 1 status post right transfemoral access with aortic cuffs for treatment of saccular pseudoaneurysm.  Postop in the recovery room became profoundly hypotensive briefly.  Ultimately I took him back and did left transfemoral access with a right leg angio that showed no active extravasation including at the access site.  Today both groins look great.  He has palpable PT pulses.  Hemodynamically stable overnight.  Hemoglobin stable this morning at 10.3.  He wants to go home.  Follow-up in 1 month with CTA abdomen pelvis assuming he gets up and walks without any issue.  Continue aspirin  Lipitor Plavix .  Discussed he call with questions or concerns.  Young Hensen, MD [facility] [place] Office: [phone]

## 2024-04-23 ENCOUNTER — Telehealth: Payer: Self-pay

## 2024-04-23 ENCOUNTER — Other Ambulatory Visit (HOSPITAL_COMMUNITY): Payer: Self-pay

## 2024-04-23 ENCOUNTER — Other Ambulatory Visit: Payer: Self-pay

## 2024-04-23 DIAGNOSIS — I714 Abdominal aortic aneurysm, without rupture, unspecified: Secondary | ICD-10-CM | POA: Diagnosis not present

## 2024-04-23 DIAGNOSIS — Z48812 Encounter for surgical aftercare following surgery on the circulatory system: Secondary | ICD-10-CM | POA: Diagnosis not present

## 2024-04-23 DIAGNOSIS — Z952 Presence of prosthetic heart valve: Secondary | ICD-10-CM | POA: Diagnosis not present

## 2024-04-23 DIAGNOSIS — N138 Other obstructive and reflux uropathy: Secondary | ICD-10-CM | POA: Diagnosis not present

## 2024-04-23 DIAGNOSIS — Z7982 Long term (current) use of aspirin: Secondary | ICD-10-CM | POA: Diagnosis not present

## 2024-04-23 DIAGNOSIS — Z7902 Long term (current) use of antithrombotics/antiplatelets: Secondary | ICD-10-CM | POA: Diagnosis not present

## 2024-04-23 DIAGNOSIS — N401 Enlarged prostate with lower urinary tract symptoms: Secondary | ICD-10-CM | POA: Diagnosis not present

## 2024-04-23 DIAGNOSIS — I2510 Atherosclerotic heart disease of native coronary artery without angina pectoris: Secondary | ICD-10-CM | POA: Diagnosis not present

## 2024-04-23 DIAGNOSIS — I6523 Occlusion and stenosis of bilateral carotid arteries: Secondary | ICD-10-CM | POA: Diagnosis not present

## 2024-04-23 DIAGNOSIS — Z8551 Personal history of malignant neoplasm of bladder: Secondary | ICD-10-CM | POA: Diagnosis not present

## 2024-04-23 DIAGNOSIS — I7143 Infrarenal abdominal aortic aneurysm, without rupture: Secondary | ICD-10-CM

## 2024-04-23 DIAGNOSIS — E785 Hyperlipidemia, unspecified: Secondary | ICD-10-CM | POA: Diagnosis not present

## 2024-04-23 DIAGNOSIS — D649 Anemia, unspecified: Secondary | ICD-10-CM | POA: Diagnosis not present

## 2024-04-23 DIAGNOSIS — E039 Hypothyroidism, unspecified: Secondary | ICD-10-CM | POA: Diagnosis not present

## 2024-04-23 DIAGNOSIS — Z87891 Personal history of nicotine dependence: Secondary | ICD-10-CM | POA: Diagnosis not present

## 2024-04-23 MED ORDER — PREDNISONE 50 MG PO TABS
ORAL_TABLET | ORAL | 0 refills | Status: DC
Start: 2024-04-23 — End: 2024-06-29

## 2024-04-23 MED ORDER — DIPHENHYDRAMINE HCL 50 MG PO CAPS: ORAL_CAPSULE | ORAL | 0 refills | Status: DC

## 2024-04-23 NOTE — Transitions of Care (Post Inpatient/ED Visit) (Signed)
   04/23/2024  Name: [facility] Trippett Jr. MRN: [id number] DOB: [birth year]  Today's TOC FU Call Status:   Received voicemail from patient who was returning [facility] RN's call. Patient states his wife is not at home and we'd need to speak with her, states she has the discharge  paperwork, states Home Health PT is coming in and PT is what he feels he needs. Patient agreeable to call back Monday   Follow Up Plan: Additional outreach attempts will be made to reach the patient to complete the Transitions of Care (Post Inpatient/ED visit) call.   Tonia Frankel RN, CCM   [facility] RN Care Manager [phone]

## 2024-04-23 NOTE — Discharge Summary (Signed)
 EVAR Discharge Summary   [facility] Anthony Noble. October 08, 1941 83 y.o. male  MRN: [id number]  Admission Date: 04/21/2024  Discharge Date: 04/22/24  Physician: Dr. Fulton Job  Admission Diagnosis: Infrarenal abdominal aortic aneurysm (AAA) without rupture [facility]) [I71.43] AAA (abdominal aortic aneurysm) [facility]) [I71.40]  Discharge Day services:    See progress note 04/22/24  Hospital Course:  Anthony Noble is an 83 year old male who was brought in as an outpatient by Dr. Fulton Job on 04/21/2024 and underwent endovascular repair of saccular abdominal aortic aneurysm with tube stent graft.  He tolerated the procedure well however postoperatively he experienced hypotension.  Due to concern for retroperitoneal bleeding he was brought back to the operating room and underwent aortogram which was negative for any bleeding.  He was kept overnight in the stepdown unit.  Postoperative day #1 vital signs were stable.  Both groin access sites were without hematoma.  He did have some bruising in the right groin however this was soft.  He will follow-up with Dr. Fulton Job in [place] in 1 month with a CTA abdomen and pelvis.  He was prescribed 1 to 2 days of narcotic pain medication for continued postoperative pain control.  He was discharged home in stable condition.  CBC    Component Value Date/Time   WBC 7.6 04/22/2024 0508   RBC 3.51 (L) 04/22/2024 0508   HGB 10.3 (L) 04/22/2024 0508   HGB 13.7 12/30/2023 1002   HCT 31.4 (L) 04/22/2024 0508   HCT 42.5 12/30/2023 1002   PLT 135 (L) 04/22/2024 0508   PLT 222 12/30/2023 1002   MCV 89.5 04/22/2024 0508   MCV 91 12/30/2023 1002   MCH 29.3 04/22/2024 0508   MCHC 32.8 04/22/2024 0508   RDW 14.1 04/22/2024 0508   RDW 12.9 12/30/2023 1002   LYMPHSABS 1.4 12/30/2023 1002   EOSABS 0.2 12/30/2023 1002   BASOSABS 0.1 12/30/2023 1002    BMET    Component Value Date/Time   NA 136 04/22/2024 0508   NA 144 12/30/2023 1002   K 3.7 04/22/2024 0508   CL 104  04/22/2024 0508   CO2 23 04/22/2024 0508   GLUCOSE 106 (H) 04/22/2024 0508   BUN 9 04/22/2024 0508   BUN 16 12/30/2023 1002   CREATININE 0.94 04/22/2024 0508   CREATININE 1.08 03/24/2013 0832   CALCIUM  8.1 (L) 04/22/2024 0508   GFRNONAA >60 04/22/2024 0508   GFRAA 72 06/13/2020 0823         Discharge Diagnosis:  Infrarenal abdominal aortic aneurysm (AAA) without rupture (HCC) [I71.43] AAA (abdominal aortic aneurysm) (HCC) [I71.40]  Secondary Diagnosis: Patient Active Problem List   Diagnosis Date Noted   AAA (abdominal aortic aneurysm) (HCC) 04/21/2024   S/P TAVR (transcatheter aortic valve replacement) 02/10/2024   AAA (abdominal aortic aneurysm) without rupture (HCC) 01/13/2024   CAD S/P percutaneous coronary angioplasty 12/25/2023   Severe aortic stenosis    Bilateral carotid artery stenosis without cerebral infarction    Low tension glaucoma of right eye, severe stage 12/20/2021   Low-tension glaucoma of left eye, mild stage 12/20/2021   Senile purpura (HCC) 04/29/2018   Malignant neoplasm of urinary bladder (HCC) 04/06/2014   History of Guillain-Barre syndrome 04/06/2014   History of colonic polyps 04/06/2014   BPH (benign prostatic hyperplasia) 04/06/2014   Erectile dysfunction 04/06/2014   Hyperlipidemia 04/06/2014   Hypothyroidism 03/30/2013   BCG vaccine causing adverse effect in therapeutic use 02/06/2012   History of pleural effusion 2001   Past Medical History:  Diagnosis Date  AAA (abdominal aortic aneurysm) (HCC)    Anemia    Bilateral carotid artery stenosis without cerebral infarction    vascular--- dr Fulton Job;   hx CVA;  last duplex  in epic 02-11-2023 left ICA 1-39%,  righrt ICA 40-59%,  bilateral ECA >50%   , pt to maintain on ASA 81mg  and statin   BPH (benign prostatic hypertrophy) with urinary obstruction    urologist--- dr Inga Manges   CAD S/P percutaneous coronary angioplasty 12/25/2023   s/p PTCA/DES x 1 proximal RCA   Coronary artery disease     Diverticulosis of colon    Glaucoma    Guillain Barr syndrome (HCC) 2001   Heart murmur 2022   History of bladder cancer 2004   s/p TURBT  and BCG treatment's   History of colon polyps    History of pleural effusion 2001   s/p  VATS w/ drainage   History of vertebral fracture 2001   2001--   T12   Hyperlipidemia    Hypothyroidism    followed by pcp   Pneumonia 2001   S/P TAVR (transcatheter aortic valve replacement) 02/10/2024   s/p TAVR with a 26 mm Edwards S3UR via the TF approach using Sentinel Cerbral Embolic Protection by Dr. Abel Hoe & Dr. Sherene Dilling   Severe aortic stenosis    Tinnitus    Varicose veins of both lower extremities      Allergies as of 04/22/2024       Reactions   Bcg Live Other (See Comments)   Treatment pt had- shakes   Septra [bactrim] Other (See Comments)   Muscle weakness, numbness   Sulfa Antibiotics Other (See Comments)   Severe weakness   Pertussis Vaccines    History of Gillian barrie   Iodinated Contrast Media Rash        Medication List     TAKE these medications    acetaminophen  650 MG CR tablet Commonly known as: TYLENOL  Take 1,300 mg by mouth every 8 (eight) hours as needed for pain.   aspirin  EC 81 MG tablet Take 81 mg by mouth daily. Swallow whole.   atorvastatin  80 MG tablet Commonly known as: LIPITOR Take 1 tablet (80 mg total) by mouth daily.   betamethasone dipropionate 0.05 % ointment Commonly known as: DIPROLENE Apply 1 Application topically 2 (two) times daily.   brimonidine 0.2 % ophthalmic solution Commonly known as: ALPHAGAN Place 1 drop into the right eye 3 (three) times daily.   clopidogrel  75 MG tablet Commonly known as: PLAVIX  Take 1 tablet (75 mg total) by mouth daily.   CoQ10 400 MG Caps Take 400 mg by mouth daily.   CRANBERRY PO Take 500 mg by mouth daily.   diphenhydrAMINE  25 MG tablet Commonly known as: BENADRYL  Take 25 mg by mouth every 6 (six) hours as needed for itching.    dorzolamide -timolol  2-0.5 % ophthalmic solution Commonly known as: COSOPT  Place 1 drop into both eyes 2 (two) times daily.   ferrous sulfate  325 (65 FE) MG tablet Take 325 mg by mouth daily.   Fish Oil 1000 MG Caps Take 1,000 mg by mouth daily.   HAIR SKIN NAILS PO Take 1 tablet by mouth daily.   hyoscyamine  0.125 MG SL tablet Commonly known as: LEVSIN SL DISSOLVE 1 TABLET IN MOUTH EVERY 8 HOURS AS NEEDED   levothyroxine  137 MCG tablet Commonly known as: SYNTHROID  Take 1 tablet (137 mcg total) by mouth daily before breakfast.   loratadine  10 MG tablet Commonly known as: CLARITIN   Take 10 mg by mouth daily.   Magnesium  400 MG Tabs Take 400 mg by mouth daily.   multivitamin with minerals Tabs tablet Take 1 tablet by mouth daily. Centrum silver 50+   NON FORMULARY Take 15 mg by mouth at bedtime. CBD Gummy hemp isolate   oxyCODONE -acetaminophen  5-325 MG tablet Commonly known as: PERCOCET/ROXICET Take 1 tablet by mouth every 6 (six) hours as needed for moderate pain (pain score 4-6).   Rocklatan 0.02-0.005 % Soln Generic drug: Netarsudil-Latanoprost Place 1 drop into both eyes at bedtime.   sodium chloride  0.65 % Soln nasal spray Commonly known as: OCEAN Place 1 spray into both nostrils as needed for congestion. May continue to use throughout the day as needed for congestion   triamcinolone  cream 0.1 % Commonly known as: KENALOG  Apply 1 Application topically 2 (two) times daily.   Tums Ultra 1000 1000 MG chewable tablet Generic drug: calcium  elemental as carbonate Chew 1,000 mg by mouth at bedtime.   Vitamin D  50 MCG (2000 UT) Caps Take 2,000 Units by mouth daily.        Discharge Instructions:   [facility]  Discharge Instructions Endovascular Aortic Aneurysm Repair  Please refer to the following instructions for your post-procedure care. Your surgeon or Physician Assistant will discuss any changes with  you.  Activity  You are encouraged to walk as much as you can. You can slowly return to normal activities but must avoid strenuous activity and heavy lifting until your doctor tells you it's OK. Avoid activities such as vacuuming or swinging a gold club. It is normal to feel tired for several weeks after your surgery. Do not drive until your doctor gives the OK and you are no longer taking prescription pain medications. It is also normal to have difficulty with sleep habits, eating, and bowel movements after surgery. These will go away with time.  Bathing/Showering  You may shower after you go home. If you have an incision, do not soak in a bathtub, hot tub, or swim until the incision heals completely.  Incision Care  Shower every day. Clean your incision with mild soap and water . Pat the area dry with a clean towel. You do not need a bandage unless otherwise instructed. Do not apply any ointments or creams to your incision. If you clothing is irritating, you may cover your incision with a dry gauze pad.  Diet  Resume your normal diet. There are no special food restrictions following this procedure. A low fat/low cholesterol diet is recommended for all patients with vascular disease. In order to heal from your surgery, it is CRITICAL to get adequate nutrition. Your body requires vitamins, minerals, and protein. Vegetables are the best source of vitamins and minerals. Vegetables also provide the perfect balance of protein. Processed food has little nutritional value, so try to avoid this.  Medications  Resume taking all of your medications unless your doctor or Physician Assistnat tells you not to. If your incision is causing pain, you may take over-the-counter pain relievers such as acetaminophen  (Tylenol ). If you were prescribed a stronger pain medication, please be aware these medications can cause nausea and constipation. Prevent nausea by taking the medication with a snack or meal. Avoid  constipation by drinking plenty of fluids and eating foods with a high amount of fiber, such as fruits, vegetables, and grains. Do not take Tylenol  if you are taking prescription pain medications.   Follow up  Our office will schedule a follow-up  appointment with a C.T. scan 3-4 weeks after your surgery.  Please call us  immediately for any of the following conditions  Severe or worsening pain in your legs or feet or in your abdomen back or chest. Increased pain, redness, drainage (pus) from your incision sit. Increased abdominal pain, bloating, nausea, vomiting or persistent diarrhea. Fever of 101 degrees or higher. Swelling in your leg (s),  Reduce your risk of vascular disease  Stop smoking. If you would like help call [facility] at 1-800-QUIT-NOW (249-049-1697) or [place] at [phone]. Manage your cholesterol Maintain a desired weight Control your diabetes Keep your blood pressure down  If you have questions, please call the office at [phone].    Disposition: home  Patient's condition: is Good  Follow up: 1. Dr. Fulton Job in 4 weeks with CTA protocol   Cordie Deters, PA-C [facility] [phone] 04/23/2024  9:23 AM   - For VQI Registry use - Post-op:  Time to Extubation: [x]  In OR, [ ]  < 12 hrs, [ ]  12-24 hrs, [ ]  >=24 hrs Vasopressors Req. Post-op: No MI: No., [ ]  Troponin only, [ ]  EKG or Clinical New Arrhythmia: No CHF: No ICU Stay: 0 days  Complications: Resp failure: No., [ ]  Pneumonia, [ ]  Ventilator Chg in renal function: No., [ ]  Inc. Cr > 0.5, [ ]  Temp. Dialysis,  [ ]  Permanent dialysis Leg ischemia: No., no Surgery needed, [ ]  Yes, Surgery needed,  [ ]  Amputation Bowel ischemia: No., [ ]  Medical Rx, [ ]  Surgical Rx Wound complication: No., [ ]  Superficial separation/infection, [ ]  Return to OR Return to OR: Yes  Return to OR for bleeding: Yes, concern for RP hematoma Stroke: No., [ ]  Minor, [ ]  Major  Discharge  medications: Statin use:  Yes  ASA use:  Yes  Plavix  use:  Yes  Beta blocker use:  No  ARB use:  No ACEI use:  No CCB use:  No

## 2024-04-23 NOTE — Transitions of Care (Post Inpatient/ED Visit) (Signed)
   04/23/2024  Name: [facility] Gingras Jr. MRN: [id number] DOB: [birth year]  Today's TOC FU Call Status: Today's TOC FU Call Status:: Unsuccessful Call (1st Attempt) Unsuccessful Call (1st Attempt) Date: 04/23/24  Attempted to reach the patient regarding the most recent Inpatient/ED visit.  Follow Up Plan: Additional outreach attempts will be made to reach the patient to complete the Transitions of Care (Post Inpatient/ED visit) call.   Tonia Frankel RN, CCM   [facility] RN Care Manager [phone]

## 2024-04-25 LAB — TYPE AND SCREEN
ABO/RH(D): O POS
Antibody Screen: NEGATIVE
Unit division: 0
Unit division: 0

## 2024-04-25 LAB — BPAM RBC
Blood Product Expiration Date: 202506102359
Blood Product Expiration Date: 202506102359
ISSUE DATE / TIME: 202505141304
ISSUE DATE / TIME: 202505141304
Unit Type and Rh: 5100
Unit Type and Rh: 5100

## 2024-04-26 ENCOUNTER — Telehealth: Payer: Self-pay | Admitting: *Deleted

## 2024-04-26 ENCOUNTER — Other Ambulatory Visit: Payer: Self-pay | Admitting: Physician Assistant

## 2024-04-26 MED ORDER — TRAMADOL HCL 50 MG PO TABS: 50.0000 mg | ORAL_TABLET | Freq: Two times a day (BID) | ORAL | 0 refills | Status: AC

## 2024-04-26 NOTE — Telephone Encounter (Signed)
 Triage call regarding pain and discomfort from swelling and surgical incision. Pt prescribed percocet at discharge of surgery but doesn't want to take them due to risk of constipation and sleepiness  . PA called in 5 tabs of tramdol. Encouraged pt to walk and clean the area with soap and water  daily and to keep area dry. Also suggested placing a gauze between the groin to help with friction and to elevate his leg when sitting . Pt is to call if worsening symptoms .

## 2024-04-26 NOTE — Transitions of Care (Post Inpatient/ED Visit) (Signed)
   04/26/2024  Name: [facility] Gloss Jr. MRN: [id number] DOB: 19-Oct-1941  Today's TOC FU Call Status: Today's TOC FU Call Status:: Successful TOC FU Call Completed TOC FU Call Complete Date: 04/26/24 Patient's Name and Date of Birth confirmed.  Transition Care Management Follow-up Telephone Call Date of Discharge: 04/22/24 Discharge Facility: [facility]) Type of Discharge: Inpatient Admission Primary Inpatient Discharge Diagnosis:: Infrarenal abdominal aortic aneurysm (AAA) without rupture (HCC) (I71.43)  AAA (abdominal aortic aneurysm) (HCC) (I71.40) How have you been since you were released from the hospital?:  (unable to assess,  spouse declined) Any questions or concerns?:  (unable to assess)  Items Reviewed: Did you receive and understand the discharge instructions provided?:  (unable to assess) Medications obtained,verified, and reconciled?: No (pt/ spouse declined) Any new allergies since your discharge?: No Dietary orders reviewed?: No (unable to assess) Do you have support at home?: Yes People in Home [RPT]: spouse Name of Support/Comfort Primary Source: DPR  Lynda Student  Medications Reviewed Today: Medications Reviewed Today   Medications were not reviewed in this encounter     Home Care and Equipment/Supplies: Were Home Health Services Ordered?: Yes Name of Home Health Agency:: (unable to assess)   spouse states " PT and OT have started services" Has Agency set up a time to come to your home?: Yes Any new equipment or medical supplies ordered?: No  Functional Questionnaire: Do you need assistance with bathing/showering or dressing?:  (unable to assess functional status/ ADL)  Follow up appointments reviewed: PCP Follow-up appointment confirmed?: No (unable to assess further) MD Provider Line Number:[phone] Given: No Specialist Hospital Follow-up appointment confirmed?: Yes Date of Specialist follow-up appointment?: 06/01/24 Follow-Up Specialty  Provider:: Jimmye Moulds Do you need transportation to your follow-up appointment?: No Do you understand care options if your condition(s) worsen?: Yes-patient verbalized understanding    Cecilie Coffee [facility], BSN RN Care Manager/ Transition of Care [place]/ [facility] [phone]

## 2024-05-08 ENCOUNTER — Other Ambulatory Visit: Payer: Self-pay | Admitting: Family Medicine

## 2024-05-13 DIAGNOSIS — H2513 Age-related nuclear cataract, bilateral: Secondary | ICD-10-CM | POA: Diagnosis not present

## 2024-05-13 DIAGNOSIS — E119 Type 2 diabetes mellitus without complications: Secondary | ICD-10-CM | POA: Diagnosis not present

## 2024-05-13 DIAGNOSIS — H401213 Low-tension glaucoma, right eye, severe stage: Secondary | ICD-10-CM | POA: Diagnosis not present

## 2024-05-13 DIAGNOSIS — H401221 Low-tension glaucoma, left eye, mild stage: Secondary | ICD-10-CM | POA: Diagnosis not present

## 2024-05-14 NOTE — Progress Notes (Signed)
 [phone]) Low tension glaucoma of right eye, Severe stage (Y59.8778) Low-tension glaucoma of left eye, mild stage     he has the following risk factors: increased IOP level, thinner central corneas, older age and larger cup-to-disc ratio or small optic nerve rim area, diabetes, patient has h/o trumpet playing.  Manifested by Visual field loss, nerve tissue loss and increased cupping both eyes   I discussed the collaborative normal-tension glaucoma trial Discussed [facility] [employer] [place]  study on safety of CAI medications in setting of sulfa antibiotic allergies show that risk of cross-reactivity is very low and therefor it is reasonable to use as benefit likely outweighs risk and safe to use.   The risks and benefits of the following options were discussed with the patient including observation, medication, laser, or surgery.  I think given evidence of damage, it would be better to start medication to lower eye pressure rather than do SLT which may be utilized later if necessary.  Recommend checking nocturnal blood pressures as wife was a nurse thinks his pressure may go down in the evening which may predispose him to developing damage at lower pressures.  I had a long talk with him regarding absence of symptoms with glaucoma and the importance to take his medications and follow-up for appointments.  06/16/19 IOP high OU. Discussed adding second medication vs SLT. Patient chose medications  08/05/19 IOP is good OU.  Will adjust medications as below.  05/18/20 IOP is good OU. AVF possible progression OU. Difficult to assess given unreliability OU. Will continue current treatment.  02/15/21 IOP high OD, borderline OS Discussed options to lower IOP in the right eye: Laser vs adding 3rd drop. Patient chooses to add brimonidine  BID OD  04/04/21 IOP is high right eye, good left eye. Will start rocklatan  OD  05/17/21 IOP OD good OU  Start Rocklatan  QHS OU, cosopt  BID OU, AG BID  OD  12/20/2021 IOP good OU.  Baseline aVF 10-2 OD.  Patient asked about alternative for Rocklatan .  Discussed that there was no alternative other than surgery.  He wished to continue medication at this time.  He also may Rocklatan  nightly OU, Cosopt  twice daily OU, Alphagan  twice daily OU.  05/02/22 IOP is borderline OD, borderline OS. AVF stable OU. Will increase Brimonidine  to tid. Continue Rocklatan  and Cosopt .  09/05/22 AVF shows history of fluctuation OD, stable OS. IOP is borderline high OD 13, good OS 11. Patient states he is having difficulty getting in the mid day dose of Brimonidine . Discussed with patient how to do Brimonidine  every 8 hours.  01/01/23 IOP good OD 12, good OS 14. OCT NFL progression OU. Macula stable OU.  Patient reports he sometimes misses the mid day dose of Brimonidine  OD. Patient will reset the Eyedrop alarm to remind him to get his mid day dose in. Discussed with patient if he is unable to use his drops as instructed or if IOP not at target he may require surgery in the future. We may consider SLT in the future as well. Will cont Rock qhs OU, Dorz/tim bid OU, and Brim tid OD.  05/14/23 AVF Difficult to assess given unreliability, likely stable OD, likely stable OS. IOP high OD 14, good OS 14. Discussed with patient if progression on VF or IOP still elevated OD at next visit we may consider SLT.  09/22/23 IOP OD high 15, OS good 15.  OCT NFL progression OU.  Options of SLT vs surgery.  Patient would like to  try taking drops more reliably. Medications: Rocklatan  at bedtime both eyes Dorzolamide -Timolol  2 times a day both eyes Brimonidine  3 times a day right eye    11/03/23 IOP good OD 11, good OS 15. Will start Brimonidine  OU.  Medications: Rocklatan  at bedtime both eyes Dorzolamide -Timolol  2 times a day both eyes Brimonidine  3 times a day right eye only   04/16/24 IOP good OD 12, good OS 15, AVF progression OD, stable OS. Patient states he is abel to get his  drops in daily but not always at the same time for the mid day dose of Brimonidine . Discussed with patient he is showing fast progression on VF despite good IOP. We discussed option of diurnal curve vs repeat VF OD vs proceeding with surgery. Risks and benefits discussed with patient. Patient chose diurnal curve. Discussed with patient if he shows a spike in IOP or continued worsening on VF he will need Trabeculectomy.  Medications: Rocklatan  at bedtime both eyes Dorzolamide -Timolol  2 times a day both eyes Brimonidine  3 times a day right eye only  05/14/24 diurnal curve from 05/13/24 showed IOP was borderline. Will schedule SLT OD given increase in IOP.  Since IOP did not go up too high the SLT may be enough to get his IOP to target.   Indications for surgery include: Patient is at risk of vision loss if the eye pressure is not at or below target pressure. The options of glaucoma filtering procedures, glaucoma drainage implants, cyclodestructive procedures, combined procedures, and no surgery including the risks and benefits of each including but not limited to loss of vision, loss of the eye, pain, induction of astigmatism, cosmetic changes, low/high eye pressure, infection (short and long term), retinal detachment/swelling, double vision, corneal swelling and decompensation, need for more surgery, anesthetic drug reaction, cataract formation in phakic patients, failure requiring repeat or further procedures and/or medications, and progression of disease, were discussed with the patient. The patient selected Selective laser trabeculoplasty right eye   Medications: Rocklatan  at bedtime both eyes Dorzolamide -Timolol  2 times a day both eyes Brimonidine  3 times a day right eye only    IOP - Right OD OS  05/13/2024 <redacted file path> 11, 12, 12.5, 12, 12, 12, 13, 10, 10 12, 12.5, 13, 12.5, 11, 12, 13, 12, 11.   Target pressures are  OD 12, OS 17    (H25.13) Combined form cataract of both  eyes - early stages and without any symptoms at this time. Monitor for progression.    (E11.9) Type 2 diabetes mellitus without complication, without long-term current use of insulin (CMS-HCC)  -Recommend aggressive glucose control and encourage improved diet, exercise and BP control.  -Recommend dilated fundus exam in 12 months   -Results of today's exam communicated to patient's primary care providers     Retinoschisis, OS Followed by Dr. Valdemar     -Schedule SLT OD   This note is written by HULAN CANNER, in the presence of and acting as the scribe of Dr. DEMPSEY JINNY PARDON, MD.   I discussed the above diagnoses listed in the Impression and Plan with the patient/parent(s). I personally performed and evaluated this patient as documented by the resident/fellow/technician entries and reconciled when needed. The Chief Complaint and HPI as documented was explored in detail and no additional concerns were reported. I have reviewed past medical history, family history, social history, review of symptoms, medications and allergies as documented in the patient's electronic medical record and agree or have revised as indicated. I performed medication  reconciliation if medications were prescribed or dosages adjusted. I developed the management plan and counseled the patient/parents on treatment options as outlined above.

## 2024-05-20 ENCOUNTER — Ambulatory Visit (HOSPITAL_COMMUNITY)
Admission: RE | Admit: 2024-05-20 | Discharge: 2024-05-20 | Disposition: A | Discharge status: Home / Self Care | Source: Ambulatory Visit | Attending: Vascular Surgery | Admitting: Vascular Surgery

## 2024-05-20 DIAGNOSIS — I7143 Infrarenal abdominal aortic aneurysm, without rupture: Secondary | ICD-10-CM | POA: Diagnosis not present

## 2024-05-20 DIAGNOSIS — I700 Atherosclerosis of aorta: Secondary | ICD-10-CM | POA: Diagnosis not present

## 2024-05-20 DIAGNOSIS — K429 Umbilical hernia without obstruction or gangrene: Secondary | ICD-10-CM | POA: Diagnosis not present

## 2024-05-20 DIAGNOSIS — K5730 Diverticulosis of large intestine without perforation or abscess without bleeding: Secondary | ICD-10-CM | POA: Diagnosis not present

## 2024-05-20 MED ORDER — IOHEXOL 350 MG/ML SOLN
100.0000 mL | Freq: Once | INTRAVENOUS | Status: AC | PRN
  Administered 2024-05-20: 100 mL via INTRAVENOUS

## 2024-05-20 MED ORDER — SODIUM CHLORIDE (PF) 0.9 % IJ SOLN
INTRAMUSCULAR | Status: AC
Start: 2024-05-20 — End: 2024-05-20
  Filled 2024-05-20: qty 50

## 2024-06-01 ENCOUNTER — Encounter: Payer: Self-pay | Admitting: Vascular Surgery

## 2024-06-01 ENCOUNTER — Ambulatory Visit: Attending: Vascular Surgery | Admitting: Vascular Surgery

## 2024-06-01 VITALS — BP 120/75 | HR 65 | Temp 98.6°F | Resp 18 | Ht 68.0 in | Wt 180.9 lb

## 2024-06-01 DIAGNOSIS — I7143 Infrarenal abdominal aortic aneurysm, without rupture: Secondary | ICD-10-CM

## 2024-06-01 DIAGNOSIS — I6523 Occlusion and stenosis of bilateral carotid arteries: Secondary | ICD-10-CM

## 2024-06-01 NOTE — Progress Notes (Signed)
 Patient name: Anthony Heffner Simonich Jr. MRN: [id number] DOB: Aug 07, 1941 Sex: male  REASON FOR VISIT: Postop check after aortic stent for saccular aneurysm  HPI: Anthony Noble. is a 83 y.o. 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  He did develop hypotension in the PACU requiring takeback to the OR.  No bleeding source was identified.  Ultimately did well and was discharged.  States his groins were bruised but these have healed up.  Past Medical History:  Diagnosis Date   AAA (abdominal aortic aneurysm) (HCC)    Anemia    Bilateral carotid artery stenosis without cerebral infarction    vascular--- dr gretta;   hx CVA;  last duplex  in epic 02-11-2023 left ICA 1-39%,  righrt ICA 40-59%,  bilateral ECA >50%   , pt to maintain on ASA 81mg  and statin   BPH (benign prostatic hypertrophy) with urinary obstruction    urologist--- dr watt   CAD S/P percutaneous coronary angioplasty 12/25/2023   s/p PTCA/DES x 1 proximal RCA   Coronary artery disease    Diverticulosis of colon    Glaucoma    Guillain Barr syndrome (HCC) 2001   Heart murmur 2022   History of bladder cancer 2004   s/p TURBT  and BCG treatment's   History of colon polyps    History of pleural effusion 2001   s/p  VATS w/ drainage   History of vertebral fracture 2001   2001--   T12   Hyperlipidemia    Hypothyroidism    followed by pcp   Pneumonia 2001   S/P TAVR (transcatheter aortic valve replacement) 02/10/2024   s/p TAVR with a 26 mm Edwards S3UR via the TF approach using Sentinel Cerbral Embolic Protection by Dr. Verlin & Dr. Lucas   Severe aortic stenosis    Tinnitus    Varicose veins of both lower extremities     Past Surgical History:  Procedure Laterality Date   ABDOMINAL AORTIC ENDOVASCULAR STENT GRAFT N/A 04/21/2024   Procedure: INSERTION, ENDOVASCULAR STENT GRAFT, AORTA, ABDOMINAL;  Surgeon: gretta Lonni PARAS, MD;  Location: [facility] OR;  Service: Vascular;  Laterality: N/A;   AORTOGRAM  04/21/2024   Procedure: AORTOGRAM;  Surgeon: gretta Lonni PARAS, MD;  Location: [facility] OR;  Service: Vascular;;   CORONARY STENT INTERVENTION N/A 12/25/2023   Procedure: CORONARY STENT INTERVENTION;  Surgeon: Verlin Lonni BIRCH, MD;  Location: [facility] IN[facility]ASIVE [facility];  Service: Cardiovascular;  Laterality: N/A;   CYSTO/  BILATERAL RETROGRADE PYLOGRAM/  RESECTION BLADDER TUMOR  07/30/2011   @[facility]   by dr watt   CYSTOSCOPY WITH BIOPSY N/A 01/12/2015   Procedure: CYSTOSCOPY WITH BLADDER  BIOPSY, FULGERATION;  Surgeon: Norleen PARAS watt, MD;  Location: [facility];  Service: Urology;  Laterality: N/A;   CYSTOSCOPY WITH BIOPSY Bilateral 05/13/2023   Procedure: CYSTOSCOPY,BLADDER  BIOPSY WITH FULGURATION, BILATERAL RETROGRADE PYELOGRAM;  Surgeon: watt Norleen, MD;  Location: [facility]S;  Service: Urology;  Laterality: Bilateral;  1 HR FOR CASE   INTRAOPERATIVE TRANSTHORACIC ECHOCARDIOGRAM N/A 02/10/2024   Procedure: ECHOCARDIOGRAM, TRANSTHORACIC;  Surgeon: Verlin Lonni BIRCH, MD;  Location: [facility];  Service: Cardiovascular;  Laterality: N/A;   ORIF RIGHT DISTAL RADIUS FX  12/31/2007   @[facility]   by dr c. blackman   RIGHT HEART CATH AND CORONARY ANGIOGRAPHY N/A 12/25/2023   Procedure: RIGHT HEART CATH AND CORONARY ANGIOGRAPHY;  Surgeon: Verlin Lonni  D, MD;  Location: [facility];  Service: Cardiovascular;  Laterality: N/A;   TONSILLECTOMY     child   TRANSURETHRAL RESECTION OF BLADDER TUMOR WITH MITOMYCIN -C  06/07/2003   @[facility]  by dr watt   ULTRASOUND GUIDANCE FOR VASCULAR ACCESS  04/21/2024   Procedure: ULTRASOUND GUIDANCE, FOR VASCULAR ACCESS;  Surgeon: Gretta Lonni PARAS, MD;  Location: [facility] OR;  Service: Vascular;;   ULTRASOUND GUIDANCE FOR VASCULAR ACCESS Right 04/21/2024   Procedure: ULTRASOUND GUIDANCE, FOR VASCULAR ACCESS;  Surgeon: Gretta Lonni PARAS, MD;  Location: [facility] OR;   Service: Vascular;  Laterality: Right;   VIDEO ASSISTED THORACOSCOPY (VATS)/EMPYEMA Right 05/01/2000   @[facility]   by dr hendrickson;  and Drainage of effusion    Family History  Problem Relation Age of Onset   Other Mother        varicose veins   Cancer Mother        Ovarian   Varicose Veins Mother    Nephrolithiasis Mother    COPD Father    Hyperlipidemia Father    Cancer Maternal Grandmother    Bipolar disorder Daughter    Healthy Daughter    Hypertension Sister    Healthy Daughter    Colon cancer Neg Hx     SOCIAL HISTORY: Social History   Tobacco Use   Smoking status: Former    Current packs/day: 0.00    Average packs/day: 1 pack/day for 20.0 years (20.0 ttl pk-yrs)    Types: Cigarettes    Start date: 1963    Quit date: 1983    Years since quitting: 42.5    Passive exposure: Never   Smokeless tobacco: Never  Substance Use Topics   Alcohol use: Yes    Alcohol/week: 2.0 standard drinks of alcohol    Types: 1 Shots of liquor, 1 Standard drinks or equivalent per week    Allergies  Allergen Reactions   Bcg Live Other (See Comments)    Treatment pt had- shakes   Septra [Bactrim] Other (See Comments)    Muscle weakness, numbness   Sulfa Antibiotics Other (See Comments)    Severe weakness   Pertussis Vaccines     History of Gillian barrie    Iodinated Contrast Media Rash    Current Outpatient Medications  Medication Sig Dispense Refill   acetaminophen  (TYLENOL ) 650 MG CR tablet Take 1,300 mg by mouth every 8 (eight) hours as needed for pain.     aspirin  EC 81 MG tablet Take 81 mg by mouth daily. Swallow whole.     atorvastatin  (LIPITOR ) 80 MG tablet Take 1 tablet (80 mg total) by mouth daily. 90 tablet 1   betamethasone dipropionate (DIPROLENE) 0.05 % ointment Apply 1 Application topically 2 (two) times daily.     brimonidine  (ALPHAGAN ) 0.2 % ophthalmic solution Place 1 drop into the right eye 3 (three) times daily.     calcium  elemental as carbonate (TUMS ULTRA  1000) 400 MG chewable tablet Chew 1,000 mg by mouth at bedtime.     Cholecalciferol (VITAMIN D ) 50 MCG (2000 UT) CAPS Take 2,000 Units by mouth daily.     clopidogrel  (PLAVIX ) 75 MG tablet Take 1 tablet (75 mg total) by mouth daily. 90 tablet 1   Coenzyme Q10 (COQ10) 400 MG CAPS Take 400 mg by mouth daily.     CRANBERRY PO Take 500 mg by mouth daily.     diphenhydrAMINE  (BENADRYL ) 25 MG tablet Take 25 mg by mouth every 6 (six) hours as needed for itching.  diphenhydrAMINE  (BENADRYL ) 50 MG capsule Take one capsule 1 hour prior to scan. 1 capsule 0   dorzolamide -timolol  (COSOPT ) 22.3-6.8 MG/ML ophthalmic solution Place 1 drop into both eyes 2 (two) times daily.     ferrous sulfate  325 (65 FE) MG tablet Take 325 mg by mouth daily.     hyoscyamine  (LEVSIN  SL) 0.125 MG SL tablet DISSOLVE 1 TABLET IN MOUTH EVERY 8 HOURS AS NEEDED 120 tablet 1   levothyroxine  (SYNTHROID ) 137 MCG tablet TAKE 1 TABLET BY MOUTH ONCE DAILY BEFORE BREAKFAST 90 tablet 2   loratadine  (CLARITIN ) 10 MG tablet Take 10 mg by mouth daily.     Magnesium  400 MG TABS Take 400 mg by mouth daily.     Multiple Vitamin (MULTIVITAMIN WITH MINERALS) TABS tablet Take 1 tablet by mouth daily. Centrum silver 50+     Multiple Vitamins-Minerals (HAIR SKIN NAILS PO) Take 1 tablet by mouth daily.     Netarsudil -Latanoprost  (ROCKLATAN ) 0.02-0.005 % SOLN Place 1 drop into both eyes at bedtime.     NON FORMULARY Take 15 mg by mouth at bedtime. CBD Gummy hemp isolate     Omega-3 Fatty Acids (FISH OIL) 1000 MG CAPS Take 1,000 mg by mouth daily.     predniSONE  (DELTASONE ) 50 MG tablet Take one tablet 13 hours, 7 hours, and 1 hour prior to scan. 3 tablet 0   sodium chloride  (OCEAN) 0.65 % SOLN nasal spray Place 1 spray into both nostrils as needed for congestion. May continue to use throughout the day as needed for congestion     triamcinolone  cream (KENALOG ) 0.1 % Apply 1 Application topically 2 (two) times daily. (Patient not taking: Reported on  06/01/2024) 453.6 g 1   No current facility-administered medications for this visit.    REVIEW OF SYSTEMS:  [X]  denotes positive finding, [ ]  denotes negative finding Cardiac  Comments:  Chest pain or chest pressure:    Shortness of breath upon exertion:    Short of breath when lying flat:    Irregular heart rhythm:        Vascular    Pain in calf, thigh, or hip brought on by ambulation:    Pain in feet at night that wakes you up from your sleep:     Blood clot in your veins:    Leg swelling:         Pulmonary    Oxygen at home:    Productive cough:     Wheezing:         Neurologic    Sudden weakness in arms or legs:     Sudden numbness in arms or legs:     Sudden onset of difficulty speaking or slurred speech:    Temporary loss of vision in one eye:     Problems with dizziness:         Gastrointestinal    Blood in stool:     Vomited blood:         Genitourinary    Burning when urinating:     Blood in urine:        Psychiatric    Major depression:         Hematologic    Bleeding problems:    Problems with blood clotting too easily:        Skin    Rashes or ulcers:        Constitutional    Fever or chills:      PHYSICAL EXAM: Vitals:   06/01/24 1420  06/01/24 1422  BP: (!) 154/68 120/75  Pulse: 65   Resp: 18   Temp: 98.6 F (37 C)   TempSrc: Temporal   SpO2: 100%   Weight: 180 lb 14.4 oz (82.1 kg)   Height: 5' 8 (1.727 m)     GENERAL: The patient is a well-nourished male, in no acute distress. The vital signs are documented above. CARDIAC: There is a regular rate and rhythm.  VASCULAR:  Bilateral femoral pulses palpable, no groin hematoma Bilateral PT pulses palpable PULMONARY: No respiratory distress. ABDOMEN: Soft and non-tender.   DATA:   CTA abdomen pelvis reviewed from 05/20/2024 with aortic stents in good position with exclusion of the aneurysm sac.  Some concern for delayed type II endoleak from the IMA but no growth in aneurysm  sac  Assessment/Plan:  83 y.o. male that presents for postop check after aortic stent graft x 2 for 3 cm saccular aneurysm from PAU on 04/21/2024.  He did develop hypotension in the PACU requiring takeback to the OR.  No bleeding source was identified.  Discussed CTA confirms the aortic stent grafts are in good position.  Appropriate exclusion of the aneurysm.  Will monitor with future surveillance.  Not concerned about the type II endoleak that is very subtle.  Will monitor this unless there is growth otherwise will require no further intervention.  I will plan follow-up in 6 months with EVAR duplex and he is also due for carotid surveillance and will plan carotid duplex.   Lonni DOROTHA Gaskins, MD [facility] Office: [phone]

## 2024-06-02 ENCOUNTER — Other Ambulatory Visit: Payer: Self-pay

## 2024-06-02 DIAGNOSIS — I7143 Infrarenal abdominal aortic aneurysm, without rupture: Secondary | ICD-10-CM

## 2024-06-02 DIAGNOSIS — I6523 Occlusion and stenosis of bilateral carotid arteries: Secondary | ICD-10-CM

## 2024-06-16 DIAGNOSIS — H25813 Combined forms of age-related cataract, bilateral: Secondary | ICD-10-CM | POA: Diagnosis not present

## 2024-06-16 DIAGNOSIS — H401213 Low-tension glaucoma, right eye, severe stage: Secondary | ICD-10-CM | POA: Diagnosis not present

## 2024-06-16 DIAGNOSIS — E119 Type 2 diabetes mellitus without complications: Secondary | ICD-10-CM | POA: Diagnosis not present

## 2024-06-16 DIAGNOSIS — H401221 Low-tension glaucoma, left eye, mild stage: Secondary | ICD-10-CM | POA: Diagnosis not present

## 2024-06-16 NOTE — Progress Notes (Signed)
 [phone]) Low tension glaucoma of right eye, Severe stage (Y59.8778) Low-tension glaucoma of left eye, mild stage     he has the following risk factors: increased IOP level, thinner central corneas, older age and larger cup-to-disc ratio or small optic nerve rim area, diabetes, patient has h/o trumpet playing.  Manifested by Visual field loss, nerve tissue loss and increased cupping both eyes   I discussed the collaborative normal-tension glaucoma trial Discussed [employer] [facility] [employer] [place]  study on safety of CAI medications in setting of sulfa antibiotic allergies show that risk of cross-reactivity is very low and therefor it is reasonable to use as benefit likely outweighs risk and safe to use.   The risks and benefits of the following options were discussed with the patient including observation, medication, laser, or surgery.  I think given evidence of damage, it would be better to start medication to lower eye pressure rather than do SLT which may be utilized later if necessary.  Recommend checking nocturnal blood pressures as wife was a nurse thinks his pressure may go down in the evening which may predispose him to developing damage at lower pressures.  I had a long talk with him regarding absence of symptoms with glaucoma and the importance to take his medications and follow-up for appointments.  06/16/19 IOP high OU. Discussed adding second medication vs SLT. Patient chose medications  08/05/19 IOP is good OU.  Will adjust medications as below.  05/18/20 IOP is good OU. AVF possible progression OU. Difficult to assess given unreliability OU. Will continue current treatment.  02/15/21 IOP high OD, borderline OS Discussed options to lower IOP in the right eye: Laser vs adding 3rd drop. Patient chooses to add brimonidine  BID OD  04/04/21 IOP is high right eye, good left eye. Will start rocklatan  OD  05/17/21 IOP OD good OU  Start Rocklatan  QHS OU, cosopt  BID OU, AG BID  OD  12/20/2021 IOP good OU.  Baseline aVF 10-2 OD.  Patient asked about alternative for Rocklatan .  Discussed that there was no alternative other than surgery.  He wished to continue medication at this time.  He also may Rocklatan  nightly OU, Cosopt  twice daily OU, Alphagan  twice daily OU.  05/02/22 IOP is borderline OD, borderline OS. AVF stable OU. Will increase Brimonidine  to tid. Continue Rocklatan  and Cosopt .  09/05/22 AVF shows history of fluctuation OD, stable OS. IOP is borderline high OD 13, good OS 11. Patient states he is having difficulty getting in the mid day dose of Brimonidine . Discussed with patient how to do Brimonidine  every 8 hours.  01/01/23 IOP good OD 12, good OS 14. OCT NFL progression OU. Macula stable OU.  Patient reports he sometimes misses the mid day dose of Brimonidine  OD. Patient will reset the Eyedrop alarm to remind him to get his mid day dose in. Discussed with patient if he is unable to use his drops as instructed or if IOP not at target he may require surgery in the future. We may consider SLT in the future as well. Will cont Rock qhs OU, Dorz/tim bid OU, and Brim tid OD.  05/14/23 AVF Difficult to assess given unreliability, likely stable OD, likely stable OS. IOP high OD 14, good OS 14. Discussed with patient if progression on VF or IOP still elevated OD at next visit we may consider SLT.  09/22/23 IOP OD high 15, OS good 15.  OCT NFL progression OU.  Options of SLT vs surgery.  Patient would like to  try taking drops more reliably. Medications: Rocklatan  at bedtime both eyes Dorzolamide -Timolol  2 times a day both eyes Brimonidine  3 times a day right eye    11/03/23 IOP good OD 11, good OS 15. Will start Brimonidine  OU.  Medications: Rocklatan  at bedtime both eyes Dorzolamide -Timolol  2 times a day both eyes Brimonidine  3 times a day right eye only   04/16/24 IOP good OD 12, good OS 15, AVF progression OD, stable OS. Patient states he is abel to get his  drops in daily but not always at the same time for the mid day dose of Brimonidine . Discussed with patient he is showing fast progression on VF despite good IOP. We discussed option of diurnal curve vs repeat VF OD vs proceeding with surgery. Risks and benefits discussed with patient. Patient chose diurnal curve. Discussed with patient if he shows a spike in IOP or continued worsening on VF he will need Trabeculectomy.  Medications: Rocklatan  at bedtime both eyes Dorzolamide -Timolol  2 times a day both eyes Brimonidine  3 times a day right eye only      IOP - Right OD OS  05/13/2024 <redacted file path> 11, 12, 12.5, 12, 12, 12, 13, 10, 10 12, 12.5, 13, 12.5, 11, 12, 13, 12, 11.    06/16/24 IOP high OD 14, good OS 16.  Diurnal curve from 05/13/24 showed IOP was borderline OD; however patient is showing worsening on VF OD.  Discussed options of SLT vs Trabeculectomy.   Indications for surgery include: Patient is at risk of vision loss if the eye pressure is not at or below target pressure. The options of glaucoma filtering procedures, glaucoma drainage implants, cyclodestructive procedures, combined procedures, and no surgery including the risks and benefits of each including but not limited to loss of vision, loss of the eye, pain, induction of astigmatism, cosmetic changes, low/high eye pressure, infection (short and long term), retinal detachment/swelling, double vision, corneal swelling and decompensation, need for more surgery, anesthetic drug reaction, cataract formation in phakic patients, failure requiring repeat or further procedures and/or medications, and progression of disease, were discussed with the patient. The patient selected Selective laser trabeculoplasty right eye   Medications: Rocklatan  at bedtime both eyes Dorzolamide -Timolol  2 times a day both eyes Brimonidine  3 times a day right eye only   Target pressures are  OD 12, OS 17    (H25.13) Combined form cataract of  both eyes - mild stages and without any symptoms at this time. Monitor for progression.    (E11.9) Type 2 diabetes mellitus without complication, without long-term current use of insulin (CMS-HCC) Due for [facility] 04/2025     Retinoschisis, OS Followed by Dr. Valdemar     -Keep 8/28 SLT OD   This note is written by DEMPSEY PARAS MOYA, in the presence of and acting as the scribe of Dr. DEMPSEY PARAS PARDON, MD.   I discussed the above diagnoses listed in the Impression and Plan with the patient/parent(s). I personally performed and evaluated this patient as documented by the resident/fellow/technician entries and reconciled when needed. The Chief Complaint and HPI as documented was explored in detail and no additional concerns were reported. I have reviewed past medical history, family history, social history, review of symptoms, medications and allergies as documented in the patient's electronic medical record and agree or have revised as indicated. I performed medication reconciliation if medications were prescribed or dosages adjusted. I developed the management plan and counseled the patient/parents on treatment options as outlined above.

## 2024-06-22 ENCOUNTER — Telehealth: Payer: Self-pay

## 2024-06-22 NOTE — Telephone Encounter (Signed)
 Lynda Curenton called regarding her husband Anthony Noble. She asked about the patient's Plavix , whether or not to discontinue it. She was referred to PA Comer Hummer for this medication as it was not originally prescribed by this office.    She also reported that the patient has a swollen left ankle. She was advised to have the patient elevate his leg above heart level several times throughout the day and wear compression hose. The patient and spouse know to call the office back if there is an increase in pain, swelling or if there is a change in color or temperature.

## 2024-06-28 ENCOUNTER — Ambulatory Visit: Payer: Self-pay | Admitting: *Deleted

## 2024-06-28 NOTE — Telephone Encounter (Signed)
 Reason for Disposition  [1] MILD swelling of both ankles (i.e., pedal edema) AND [2] new-onset or getting worse  Answer Assessment - Initial Assessment Questions 1. ONSET: When did the swelling start? (e.g., minutes, hours, days)     Wife, Avel calling in.   Swelling in ankle area and right above it.  This has been going on since May.   He is to keep it elevated.   I'm concerned because it's pink, sore and swelling. 2. LOCATION: What part of the leg is swollen?  Are both legs swollen or just one leg?     Ankle and above the ankle that is swollen.   He can walk on it.    3. SEVERITY: How bad is the swelling? (e.g., localized; mild, moderate, severe)     You can see his ankle bones. 4. REDNESS: Is there redness or signs of infection?     It's pink looking 5. PAIN: Is the swelling painful to touch? If Yes, ask: How painful is it?   (Scale 1-10; mild, moderate or severe)     Yes 6. FEVER: Do you have a fever? If Yes, ask: What is it, how was it measured, and when did it start?      Not asked 7. CAUSE: What do you think is causing the leg swelling?     He has seen the vascular dr.   He had a heart stent put in and a hear valve replaced.   In May he had an and aneurysm.   His groin veins have been used a lot too from the procedures.   8. MEDICAL HISTORY: Do you have a history of blood clots (e.g., DVT), cancer, heart failure, kidney disease, or liver failure?     See above 9. RECURRENT SYMPTOM: Have you had leg swelling before? If Yes, ask: When was the last time? What happened that time?     Yes See above He has been on Plavix  since the stents in Jan.   He is off of that now since July 16.  He takes an aspirin  a day.    10. OTHER SYMPTOMS: Do you have any other symptoms? (e.g., chest pain, difficulty breathing)       No 11. PREGNANCY: Is there any chance you are pregnant? When was your last menstrual period?       N/A  Protocols used: Leg Swelling and  Edema-A-AH FYI Only or Action Required?: FYI only for provider.  Patient was last seen in primary care on 03/19/2024 by Gladis Mustard, FNP.  Called Nurse Triage reporting Leg Swelling. Left ankle and just above it have been swollen since May but is becoming pink and sore.     Symptoms began several months ago. Since May     Interventions attempted: Rest, hydration, or home remedies.Keeping legs elevated as much as possible but very active and plays golf, etc.  Symptoms are: gradually worsening.  Triage Disposition: See PCP When Office is Open (Within 3 Days)  Patient/caregiver understands and will follow disposition?: Yes Wife is going to check with him when he comes home this evening and call back for an appt.   I let her know since she has talked with a nurse he just needs to be scheduled.   He prefers Tues. And THur.

## 2024-06-28 NOTE — Telephone Encounter (Signed)
 Appt scheduled for 7/22 at 3:20 with Rakes. Wife made aware. Wife will have to confirm with pt that date/time is alright. She will call back if not.

## 2024-06-29 ENCOUNTER — Encounter: Payer: Self-pay | Admitting: Family Medicine

## 2024-06-29 ENCOUNTER — Ambulatory Visit (INDEPENDENT_AMBULATORY_CARE_PROVIDER_SITE_OTHER)

## 2024-06-29 VITALS — BP 152/79 | HR 63 | Temp 97.7°F | Ht 68.0 in | Wt 181.8 lb

## 2024-06-29 DIAGNOSIS — H9313 Tinnitus, bilateral: Secondary | ICD-10-CM | POA: Diagnosis not present

## 2024-06-29 DIAGNOSIS — I872 Venous insufficiency (chronic) (peripheral): Secondary | ICD-10-CM

## 2024-06-29 DIAGNOSIS — M26622 Arthralgia of left temporomandibular joint: Secondary | ICD-10-CM | POA: Diagnosis not present

## 2024-06-29 DIAGNOSIS — L309 Dermatitis, unspecified: Secondary | ICD-10-CM

## 2024-06-29 MED ORDER — CEPHALEXIN 500 MG PO CAPS: 500.0000 mg | ORAL_CAPSULE | Freq: Four times a day (QID) | ORAL | 0 refills | Status: AC

## 2024-06-29 NOTE — Progress Notes (Signed)
 Subjective:  Patient ID: Anthony Macleod Katherene Raddle., male    DOB: [birth year], 83 y.o.   MRN: [id number]  Patient Care Team: Dettinger, Fonda LABOR, MD as PCP - General (Family Medicine) O'Neal, Darryle Ned, MD as PCP - Cardiology (Cardiology) Verlin Lonni BIRCH, MD as PCP - Structural Heart (Cardiology) Watt Rush, MD (Urology) Cary Doffing, MD (Dermatology) Nickel, Elvie CROME, NP (Inactive) as Nurse Practitioner (Vascular Surgery) Aneita Gwendlyn DASEN, MD (Inactive) as Consulting Physician (Gastroenterology) Dettinger, Fonda LABOR, MD as Consulting Physician (Family Medicine) O'Neal, Darryle Ned, MD as Consulting Physician (Cardiology)   Chief Complaint:  Edema (Right lower leg and redness x 2 months )   HPI: Anthony Macleod Richardson Dubree. is a 83 y.o. male presenting on 06/29/2024 for Edema (Right lower leg and redness x 2 months )   Anthony Macleod Grindle Jr. is an 83 year old male with venous stasis dermatitis who presents with worsening eczema and itching.  He experiences persistent itching, particularly around his ankles, which began after a surgery involving red dye. The itching sometimes extends to his back and shoulders, accompanied by visible red patches of eczema. He has been using CeraVe cream with camoxazine, but the itching persists.  He has a history of varicose veins, with discoloration and changes in his legs, especially one leg. He does not consistently wear compression socks, particularly in the summer. He recalls a previous prescription for elastic hose, which he did not use. His family history includes varicose veins, with his mother having undergone vein stripping.  He experiences constant ringing in the ears, which is bothersome. He has tried hearing aids without success and uses a small dose of CBD, which he feels helps him sleep.  He recently developed TMJ symptoms, including joint locking and clicking, particularly on one side. He does not chew gum and is unsure of the cause.  The symptoms began last week.          Relevant past medical, surgical, family, and social history reviewed and updated as indicated.  Allergies and medications reviewed and updated. Data reviewed: Chart in Epic.   Past Medical History:  Diagnosis Date   AAA (abdominal aortic aneurysm) (HCC)    Anemia    Bilateral carotid artery stenosis without cerebral infarction    vascular--- dr gretta;   hx CVA;  last duplex  in epic 02-11-2023 left ICA 1-39%,  righrt ICA 40-59%,  bilateral ECA >50%   , pt to maintain on ASA 81mg  and statin   BPH (benign prostatic hypertrophy) with urinary obstruction    urologist--- dr watt   CAD S/P percutaneous coronary angioplasty 12/25/2023   s/p PTCA/DES x 1 proximal RCA   Coronary artery disease    Diverticulosis of colon    Glaucoma    Guillain Barr syndrome (HCC) 2001   Heart murmur 2022   History of bladder cancer 2004   s/p TURBT  and BCG treatment's   History of colon polyps    History of pleural effusion 2001   s/p  VATS w/ drainage   History of vertebral fracture 2001   2001--   T12   Hyperlipidemia    Hypothyroidism    followed by pcp   Pneumonia 2001   S/P TAVR (transcatheter aortic valve replacement) 02/10/2024   s/p TAVR with a 26 mm Edwards S3UR via the TF approach using Sentinel Cerbral Embolic Protection by Dr. Verlin & Dr. Lucas   Severe aortic stenosis    Tinnitus    Varicose veins  of both lower extremities     Past Surgical History:  Procedure Laterality Date   ABDOMINAL AORTIC ENDOVASCULAR STENT GRAFT N/A 04/21/2024   Procedure: INSERTION, ENDOVASCULAR STENT GRAFT, AORTA, ABDOMINAL;  Surgeon: Gretta Lonni PARAS, MD;  Location: [facility] OR;  Service: Vascular;  Laterality: N/A;   AORTOGRAM  04/21/2024   Procedure: AORTOGRAM;  Surgeon: Gretta Lonni PARAS, MD;  Location: [facility] OR;  Service: Vascular;;   CORONARY STENT INTERVENTION N/A 12/25/2023   Procedure: CORONARY STENT INTERVENTION;  Surgeon: Verlin Lonni BIRCH, MD;   Location: [facility] IN[facility] LAB;  Service: Cardiovascular;  Laterality: N/A;   CYSTO/  BILATERAL RETROGRADE PYLOGRAM/  RESECTION BLADDER TUMOR  07/30/2011   @[facility]   by dr watt   CYSTOSCOPY WITH BIOPSY N/A 01/12/2015   Procedure: CYSTOSCOPY WITH BLADDER  BIOPSY, FULGERATION;  Surgeon: Norleen PARAS watt, MD;  Location: [facility];  Service: Urology;  Laterality: N/A;   CYSTOSCOPY WITH BIOPSY Bilateral 05/13/2023   Procedure: CYSTOSCOPY,BLADDER  BIOPSY WITH FULGURATION, BILATERAL RETROGRADE PYELOGRAM;  Surgeon: watt Norleen, MD;  Location: [facility] ORS;  Service: Urology;  Laterality: Bilateral;  1 HR FOR CASE   INTRAOPERATIVE TRANSTHORACIC ECHOCARDIOGRAM N/A 02/10/2024   Procedure: ECHOCARDIOGRAM, TRANSTHORACIC;  Surgeon: Verlin Lonni BIRCH, MD;  Location: [facility];  Service: Cardiovascular;  Laterality: N/A;   ORIF RIGHT DISTAL RADIUS FX  12/31/2007   @[facility]   by dr c. blackman   RIGHT HEART CATH AND CORONARY ANGIOGRAPHY N/A 12/25/2023   Procedure: RIGHT HEART CATH AND CORONARY ANGIOGRAPHY;  Surgeon: Verlin Lonni BIRCH, MD;  Location: [facility];  Service: Cardiovascular;  Laterality: N/A;   TONSILLECTOMY     child   TRANSURETHRAL RESECTION OF BLADDER TUMOR WITH MITOMYCIN -C  06/07/2003   @[facility]  by dr watt   ULTRASOUND GUIDANCE FOR VASCULAR ACCESS  04/21/2024   Procedure: ULTRASOUND GUIDANCE, FOR VASCULAR ACCESS;  Surgeon: Gretta Lonni PARAS, MD;  Location: [facility] OR;  Service: Vascular;;   ULTRASOUND GUIDANCE FOR VASCULAR ACCESS Right 04/21/2024   Procedure: ULTRASOUND GUIDANCE, FOR VASCULAR ACCESS;  Surgeon: Gretta Lonni PARAS, MD;  Location: [facility] OR;  Service: Vascular;  Laterality: Right;   VIDEO ASSISTED THORACOSCOPY (VATS)/EMPYEMA Right 05/01/2000   @[facility]   by dr hendrickson;  and Drainage of effusion    Social History   Socioeconomic History   Marital status: Married    Spouse name: Not on file   Number of children: 2   Years of education: Not on file   Highest  education level: Not on file  Occupational History   Occupation: retired    Comment: self employed-printing  Tobacco Use   Smoking status: Former    Current packs/day: 0.00    Average packs/day: 1 pack/day for 20.0 years (20.0 ttl pk-yrs)    Types: Cigarettes    Start date: 1963    Quit date: 1983    Years since quitting: 42.5    Passive exposure: Never   Smokeless tobacco: Never  Vaping Use   Vaping status: Never Used  Substance and Sexual Activity   Alcohol use: Yes    Alcohol/week: 2.0 standard drinks of alcohol    Types: 1 Shots of liquor, 1 Standard drinks or equivalent per week   Drug use: No    Comment: Hemp gummies   Sexual activity: Not on file  Other Topics Concern   Not on file  Social History Narrative   Lives at home with wife and daughter.      Social Drivers of Health  Financial Resource Strain: Low Risk  (05/02/2020)   Overall Financial Resource Strain (CARDIA)    Difficulty of Paying Living Expenses: Not hard at all  Food Insecurity: No Food Insecurity (02/10/2024)   Hunger Vital Sign    Worried About Running Out of Food in the Last Year: Never true    Ran Out of Food in the Last Year: Never true  Transportation Needs: No Transportation Needs (02/10/2024)   PRAPARE - Administrator, Civil Service (Medical): No    Lack of Transportation (Non-Medical): No  Physical Activity: Sufficiently Active (05/02/2020)   Exercise Vital Sign    Days of Exercise per Week: 5 days    Minutes of Exercise per Session: 60 min  Stress: No Stress Concern Present (05/02/2020)   [name] of Occupational Health - Occupational Stress Questionnaire    Feeling of Stress : Not at all  Social Connections: Socially Integrated (02/10/2024)   Social Connection and Isolation Panel    Frequency of Communication with Friends and Family: More than three times a week    Frequency of Social Gatherings with Friends and Family: More than three times a week    Attends Religious  Services: More than 4 times per year    Active Member of [employer] or Organizations: Yes    Attends Engineer, structural: More than 4 times per year    Marital Status: Married  Catering manager Violence: Not At Risk (02/10/2024)   Humiliation, Afraid, Rape, and Kick questionnaire    Fear of Current or Ex-Partner: No    Emotionally Abused: No    Physically Abused: No    Sexually Abused: No    Outpatient Encounter Medications as of 06/29/2024  Medication Sig   acetaminophen  (TYLENOL ) 650 MG CR tablet Take 1,300 mg by mouth every 8 (eight) hours as needed for pain.   aspirin  EC 81 MG tablet Take 81 mg by mouth daily. Swallow whole.   atorvastatin  (LIPITOR ) 80 MG tablet Take 1 tablet (80 mg total) by mouth daily.   betamethasone dipropionate (DIPROLENE) 0.05 % ointment Apply 1 Application topically 2 (two) times daily.   brimonidine  (ALPHAGAN ) 0.2 % ophthalmic solution Place 1 drop into the right eye 3 (three) times daily.   calcium  elemental as carbonate (TUMS ULTRA 1000) 400 MG chewable tablet Chew 1,000 mg by mouth at bedtime.   cephALEXin  (KEFLEX ) 500 MG capsule Take 1 capsule (500 mg total) by mouth 4 (four) times daily for 7 days.   Cholecalciferol (VITAMIN D ) 50 MCG (2000 UT) CAPS Take 2,000 Units by mouth daily.   Coenzyme Q10 (COQ10) 400 MG CAPS Take 400 mg by mouth daily.   CRANBERRY PO Take 500 mg by mouth daily.   diphenhydrAMINE  (BENADRYL ) 25 MG tablet Take 25 mg by mouth every 6 (six) hours as needed for itching.   diphenhydrAMINE  (BENADRYL ) 50 MG capsule Take one capsule 1 hour prior to scan.   dorzolamide -timolol  (COSOPT ) 22.3-6.8 MG/ML ophthalmic solution Place 1 drop into both eyes 2 (two) times daily.   ferrous sulfate  325 (65 FE) MG tablet Take 325 mg by mouth daily.   hyoscyamine  (LEVSIN  SL) 0.125 MG SL tablet DISSOLVE 1 TABLET IN MOUTH EVERY 8 HOURS AS NEEDED   levothyroxine  (SYNTHROID ) 137 MCG tablet TAKE 1 TABLET BY MOUTH ONCE DAILY BEFORE BREAKFAST   loratadine   (CLARITIN ) 10 MG tablet Take 10 mg by mouth daily.   Magnesium  400 MG TABS Take 400 mg by mouth daily.   Multiple Vitamin (MULTIVITAMIN  WITH MINERALS) TABS tablet Take 1 tablet by mouth daily. Centrum silver 50+   Multiple Vitamins-Minerals (HAIR SKIN NAILS PO) Take 1 tablet by mouth daily.   Netarsudil -Latanoprost  (ROCKLATAN ) 0.02-0.005 % SOLN Place 1 drop into both eyes at bedtime.   NON FORMULARY Take 15 mg by mouth at bedtime. CBD Gummy hemp isolate   Omega-3 Fatty Acids (FISH OIL) 1000 MG CAPS Take 1,000 mg by mouth daily.   sodium chloride  (OCEAN) 0.65 % SOLN nasal spray Place 1 spray into both nostrils as needed for congestion. May continue to use throughout the day as needed for congestion   triamcinolone  cream (KENALOG ) 0.1 % Apply 1 Application topically 2 (two) times daily.   clopidogrel  (PLAVIX ) 75 MG tablet Take 1 tablet (75 mg total) by mouth daily. (Patient not taking: Reported on 06/29/2024)   [DISCONTINUED] predniSONE  (DELTASONE ) 50 MG tablet Take one tablet 13 hours, 7 hours, and 1 hour prior to scan.   No facility-administered encounter medications on file as of 06/29/2024.    Allergies  Allergen Reactions   Bcg Live Other (See Comments)    Treatment pt had- shakes   Septra [Bactrim] Other (See Comments)    Muscle weakness, numbness   Sulfa Antibiotics Other (See Comments)    Severe weakness   Pertussis Vaccines     History of Gillian barrie    Iodinated Contrast Media Rash    Pertinent ROS per HPI, otherwise unremarkable      Objective:  BP (!) 152/79   Pulse 63   Temp 97.7 F (36.5 C)   Ht 5' 8 (1.727 m)   Wt 181 lb 12.8 oz (82.5 kg)   SpO2 97%   BMI 27.64 kg/m    Wt Readings from Last 3 Encounters:  06/29/24 181 lb 12.8 oz (82.5 kg)  06/01/24 180 lb 14.4 oz (82.1 kg)  04/21/24 174 lb (78.9 kg)    Physical Exam Vitals and nursing note reviewed.  Constitutional:      General: He is not in acute distress.    Appearance: Normal appearance. He is  not ill-appearing, toxic-appearing or diaphoretic.  HENT:     Head: Normocephalic and atraumatic.     Comments: Left TMJ clicking with opening and closing. Large masseter muscle on left    Mouth/Throat:     Mouth: Mucous membranes are moist.  Eyes:     Conjunctiva/sclera: Conjunctivae normal.     Pupils: Pupils are equal, round, and reactive to light.  Cardiovascular:     Rate and Rhythm: Normal rate and regular rhythm.     Pulses:          Femoral pulses are 2+ on the right side and 2+ on the left side.      Popliteal pulses are 2+ on the right side and 2+ on the left side.       Dorsalis pedis pulses are 2+ on the right side and 2+ on the left side.       Posterior tibial pulses are 2+ on the right side and 2+ on the left side.     Comments: BLE with varicose veins. Bilateral corona phlebectatica. LLE: erythema and slight hyperpigmentation in the medial supramalleolar region. RLE: hyperpigmentation in the medial supramalleolar region. No femoral hematoma.  Pulmonary:     Effort: Pulmonary effort is normal.     Breath sounds: Normal breath sounds.  Musculoskeletal:     Left lower leg: Edema (trace) present.  Skin:    General: Skin is warm  and dry.     Capillary Refill: Capillary refill takes less than 2 seconds.     Findings: Rash (scaly and slightly erythematous rash to right anterior shoulder) present.  Neurological:     General: No focal deficit present.     Mental Status: He is alert and oriented to person, place, and time.  Psychiatric:        Mood and Affect: Mood normal.        Behavior: Behavior normal.        Thought Content: Thought content normal.        Judgment: Judgment normal.     Results for orders placed or performed during the hospital encounter of 04/21/24  POCT Activated clotting time   Collection Time: 04/21/24 11:58 AM  Result Value Ref Range   Activated Clotting Time 291 seconds  Prepare RBC   Collection Time: 04/21/24 12:57 PM  Result Value Ref  Range   Order Confirmation      ORDER PROCESSED BY BLOOD BANK Performed at [facility], [address], [place], [place] [address]   I-STAT 7, (LYTES, BLD GAS, ICA, H+H)   Collection Time: 04/21/24  1:03 PM  Result Value Ref Range   pH, Arterial 7.342 (L) 7.35 - 7.45   pCO2 arterial 39.3 32 - 48 mmHg   pO2, Arterial 133 (H) 83 - 108 mmHg   Bicarbonate 21.3 20.0 - 28.0 mmol/L   TCO2 22 22 - 32 mmol/L   O2 Saturation 99 %   Acid-base deficit 4.0 (H) 0.0 - 2.0 mmol/L   Sodium 140 135 - 145 mmol/L   Potassium 3.6 3.5 - 5.1 mmol/L   Calcium , Ion 1.16 1.15 - 1.40 mmol/L   HCT 28.0 (L) 39.0 - 52.0 %   Hemoglobin 9.5 (L) 13.0 - 17.0 g/dL   Sample type ARTERIAL   CBC   Collection Time: 04/21/24  2:32 PM  Result Value Ref Range   WBC 9.1 4.0 - 10.5 K/uL   RBC 3.76 (L) 4.22 - 5.81 MIL/uL   Hemoglobin 10.7 (L) 13.0 - 17.0 g/dL   HCT 66.2 (L) 60.9 - 47.9 %   MCV 89.6 80.0 - 100.0 fL   MCH 28.5 26.0 - 34.0 pg   MCHC 31.8 30.0 - 36.0 g/dL   RDW 85.9 88.4 - 84.4 %   Platelets 138 (L) 150 - 400 K/uL   nRBC 0.0 0.0 - 0.2 %  Basic metabolic panel   Collection Time: 04/21/24  2:32 PM  Result Value Ref Range   Sodium 138 135 - 145 mmol/L   Potassium 3.6 3.5 - 5.1 mmol/L   Chloride 105 98 - 111 mmol/L   CO2 25 22 - 32 mmol/L   Glucose, Bld 105 (H) 70 - 99 mg/dL   BUN 8 8 - 23 mg/dL   Creatinine, Ser 9.16 0.61 - 1.24 mg/dL   Calcium  8.2 (L) 8.9 - 10.3 mg/dL   GFR, Estimated >39 >39 mL/min   Anion gap 8 5 - 15  Protime-INR   Collection Time: 04/21/24  2:32 PM  Result Value Ref Range   Prothrombin Time 15.6 (H) 11.4 - 15.2 seconds   INR 1.2 0.8 - 1.2  APTT   Collection Time: 04/21/24  2:32 PM  Result Value Ref Range   aPTT 39 (H) 24 - 36 seconds  Magnesium    Collection Time: 04/21/24  2:32 PM  Result Value Ref Range   Magnesium  1.9 1.7 - 2.4 mg/dL  Troponin I (High  Sensitivity)   Collection Time: 04/21/24  2:32 PM  Result Value Ref Range   Troponin I (High Sensitivity) 12  <18 ng/L  CBC   Collection Time: 04/22/24  5:08 AM  Result Value Ref Range   WBC 7.6 4.0 - 10.5 K/uL   RBC 3.51 (L) 4.22 - 5.81 MIL/uL   Hemoglobin 10.3 (L) 13.0 - 17.0 g/dL   HCT 68.5 (L) 60.9 - 47.9 %   MCV 89.5 80.0 - 100.0 fL   MCH 29.3 26.0 - 34.0 pg   MCHC 32.8 30.0 - 36.0 g/dL   RDW 85.8 88.4 - 84.4 %   Platelets 135 (L) 150 - 400 K/uL   nRBC 0.0 0.0 - 0.2 %  Basic metabolic panel   Collection Time: 04/22/24  5:08 AM  Result Value Ref Range   Sodium 136 135 - 145 mmol/L   Potassium 3.7 3.5 - 5.1 mmol/L   Chloride 104 98 - 111 mmol/L   CO2 23 22 - 32 mmol/L   Glucose, Bld 106 (H) 70 - 99 mg/dL   BUN 9 8 - 23 mg/dL   Creatinine, Ser 9.05 0.61 - 1.24 mg/dL   Calcium  8.1 (L) 8.9 - 10.3 mg/dL   GFR, Estimated >39 >39 mL/min   Anion gap 9 5 - 15       Pertinent labs & imaging results that were available during my care of the patient were reviewed by me and considered in my medical decision making.  Assessment & Plan:  Anthony Noble was seen today for edema.  Diagnoses and all orders for this visit:  Venous stasis dermatitis of both lower extremities -     cephALEXin  (KEFLEX ) 500 MG capsule; Take 1 capsule (500 mg total) by mouth 4 (four) times daily for 7 days.  Eczema of right upper extremity Discussed treatment  Tinnitus of both ears Has seen audiology and ENT  TMJ tenderness, left Discussed treatment options.       Venous Stasis Dermatitis Venous stasis dermatitis primarily affects one leg, characterized by discoloration and inflammation due to venous insufficiency with leaky valves causing blood pooling. He has not been consistently wearing compression socks, which are essential for management. The dermatitis is not currently hot or red, but there is a risk of progression to cellulitis if not managed properly. Insurance mandates at least three months of conservative therapy with prescription strength compression socks before considering surgical options. - Encourage  consistent use of prescription strength compression socks, at least knee-high or thigh-high, especially when ambulatory. - Recommend Equate eczema cream with 2% colloid oatmeal for dry patches. - Prescribe Keflex  as a pocket script for potential cellulitis, to be taken four times a day for seven days if dermatitis becomes red, hot, or worsens.  Eczema Eczema presents as itchy, dry patches on the skin, including the back and shoulders, exacerbated by heat, stress, or irritants. He has been using CeraVe cream, but a cream with higher colloid oatmeal content is recommended for better efficacy. Eczema is intrinsic and can be triggered by various factors, including environmental changes. - Recommend Equate eczema cream with 2% colloid oatmeal for eczema patches. - Educate on the importance of using creams with high colloid oatmeal content for eczema management.  Temporomandibular Joint Disorder (TMJ) Recently developed TMJ, characterized by joint locking and clicking, particularly on the left side. The masseter muscle on the left is larger, indicating overactivity, which may contribute to TMJ symptoms. Botox or other neurotoxin injections can relax the masseter muscle if symptoms  worsen, though neuromuscular injections may not be suitable due to autoimmune conditions. - Discuss potential use of Botox or other neurotoxin injections to relax the masseter muscle if TMJ symptoms worsen.  Tinnitus Experiences persistent tinnitus, described as ringing in the ears, present 24/7. He has tried hearing aids without success and uses CBD to aid sleep. High doses of certain medications like furosemide  can cause tinnitus, but he is not on such medications. Hearing aids at low hertz may help block out tinnitus, although previous attempts were unsuccessful. - Discuss potential use of hearing aids at low hertz to help block out tinnitus, despite previous unsuccessful attempts. - Acknowledge use of CBD for sleep and its  potential benefits.      Total time spent with patient today was 40 minutes, this time was spent reviewing prior charts, labs, x-rays, discussing plan of care, and documenting the encounter.     Continue all other maintenance medications.  Follow up plan: Return if symptoms worsen or fail to improve.   Continue healthy lifestyle choices, including diet (rich in fruits, vegetables, and lean proteins, and low in salt and simple carbohydrates) and exercise (at least 30 minutes of moderate physical activity daily).  Educational handout given for VV and venous stasis dermatitis   The above assessment and management plan was discussed with the patient. The patient verbalized understanding of and has agreed to the management plan. Patient is aware to call the clinic if they develop any new symptoms or if symptoms persist or worsen. Patient is aware when to return to the clinic for a follow-up visit. Patient educated on when it is appropriate to go to the emergency department.   Rosaline Bruns, FNP-C [facility] [phone]

## 2024-07-04 ENCOUNTER — Other Ambulatory Visit: Payer: Self-pay | Admitting: Physician Assistant

## 2024-07-08 ENCOUNTER — Ambulatory Visit (INDEPENDENT_AMBULATORY_CARE_PROVIDER_SITE_OTHER): Admitting: Family Medicine

## 2024-07-08 ENCOUNTER — Encounter: Payer: Self-pay | Admitting: Family Medicine

## 2024-07-08 VITALS — BP 113/71 | HR 68 | Ht 68.0 in | Wt 180.0 lb

## 2024-07-08 DIAGNOSIS — E039 Hypothyroidism, unspecified: Secondary | ICD-10-CM

## 2024-07-08 DIAGNOSIS — N401 Enlarged prostate with lower urinary tract symptoms: Secondary | ICD-10-CM

## 2024-07-08 DIAGNOSIS — E7800 Pure hypercholesterolemia, unspecified: Secondary | ICD-10-CM

## 2024-07-08 DIAGNOSIS — R351 Nocturia: Secondary | ICD-10-CM | POA: Diagnosis not present

## 2024-07-08 LAB — LIPID PANEL

## 2024-07-08 NOTE — Progress Notes (Signed)
 BP 113/71   Pulse 68   Ht 5' 8 (1.727 m)   Wt 180 lb (81.6 kg)   SpO2 96%   BMI 27.37 kg/m    Subjective:   Patient ID: Anthony Dallas Schreier Jr., male    DOB: 1941/02/09, 83 y.o.   MRN: [id number]  HPI: Anthony Dallas Birdsell Jr. is a 83 y.o. male presenting on 07/08/2024 for Medical Management of Chronic Issues, nasal drainage, and Leg Pain (LLE vein)   HPI Hypothyroidism recheck Patient is coming in for thyroid  recheck today as well. They deny any issues with hair changes or heat or cold problems or diarrhea or constipation. They deny any chest pain or palpitations. They are currently on levothyroxine  137 micrograms   Hyperlipidemia Patient is coming in for recheck of his hyperlipidemia. The patient is currently taking fish oils and atorvastatin . They deny any issues with myalgias or history of liver damage from it. They deny any focal numbness or weakness or chest pain.   BPH Patient is coming in for recheck on BPH Symptoms: None currently Medication: None currently Last PSA: 0.5 in 2024  Relevant past medical, surgical, family and social history reviewed and updated as indicated. Interim medical history since our last visit reviewed. Allergies and medications reviewed and updated.  Review of Systems  Constitutional:  Negative for chills and fever.  Eyes:  Negative for visual disturbance.  Respiratory:  Negative for shortness of breath and wheezing.   Cardiovascular:  Positive for leg swelling. Negative for chest pain.  Musculoskeletal:  Negative for back pain and gait problem.  Skin:  Negative for rash.  Neurological:  Negative for dizziness and light-headedness.  All other systems reviewed and are negative.   Per HPI unless specifically indicated above   Allergies as of 07/08/2024       Reactions   Bcg Live Other (See Comments)   Treatment pt had- shakes   Septra [bactrim] Other (See Comments)   Muscle weakness, numbness   Sulfa Antibiotics Other (See Comments)    Severe weakness   Pertussis Vaccines    History of Gillian barrie   Iodinated Contrast Media Rash        Medication List        Accurate as of July 08, 2024  9:51 AM. If you have any questions, ask your nurse or doctor.          STOP taking these medications    clopidogrel  75 MG tablet Commonly known as: PLAVIX  Stopped by: Fonda LABOR Narya Beavin   loratadine  10 MG tablet Commonly known as: CLARITIN  Stopped by: Fonda LABOR Drayson Dorko       TAKE these medications    acetaminophen  650 MG CR tablet Commonly known as: TYLENOL  Take 1,300 mg by mouth every 8 (eight) hours as needed for pain.   aspirin  EC 81 MG tablet Take 81 mg by mouth daily. Swallow whole.   atorvastatin  80 MG tablet Commonly known as: LIPITOR  Take 1 tablet by mouth once daily   betamethasone dipropionate 0.05 % ointment Commonly known as: DIPROLENE Apply 1 Application topically 2 (two) times daily.   brimonidine  0.2 % ophthalmic solution Commonly known as: ALPHAGAN  Place 1 drop into the right eye 3 (three) times daily.   cetirizine 10 MG tablet Commonly known as: ZYRTEC Take 10 mg by mouth daily.   CoQ10 400 MG Caps Take 400 mg by mouth daily.   CRANBERRY PO Take 500 mg by mouth daily.   diphenhydrAMINE  25 MG tablet Commonly known  as: BENADRYL  Take 25 mg by mouth every 6 (six) hours as needed for itching. What changed: Another medication with the same name was removed. Continue taking this medication, and follow the directions you see here. Changed by: Fonda LABOR Lucila Klecka   dorzolamide -timolol  2-0.5 % ophthalmic solution Commonly known as: COSOPT  Place 1 drop into both eyes 2 (two) times daily.   ferrous sulfate  325 (65 FE) MG tablet Take 325 mg by mouth daily.   Fish Oil 1000 MG Caps Take 1,000 mg by mouth daily.   HAIR SKIN NAILS PO Take 1 tablet by mouth daily.   hyoscyamine  0.125 MG SL tablet Commonly known as: LEVSIN  SL DISSOLVE 1 TABLET IN MOUTH EVERY 8 HOURS AS NEEDED    levothyroxine  137 MCG tablet Commonly known as: SYNTHROID  TAKE 1 TABLET BY MOUTH ONCE DAILY BEFORE BREAKFAST   Magnesium  400 MG Tabs Take 400 mg by mouth daily.   multivitamin with minerals Tabs tablet Take 1 tablet by mouth daily. Centrum silver 50+   NON FORMULARY Take 15 mg by mouth at bedtime. CBD Gummy hemp isolate   NON FORMULARY Take 1 Dose by mouth daily. Take 1/2 of 750 mg CBD gummy 15mg  CBG   Rocklatan  0.02-0.005 % Soln Generic drug: Netarsudil -Latanoprost  Place 1 drop into both eyes at bedtime.   sodium chloride  0.65 % Soln nasal spray Commonly known as: OCEAN Place 1 spray into both nostrils as needed for congestion. May continue to use throughout the day as needed for congestion   triamcinolone  cream 0.1 % Commonly known as: KENALOG  Apply 1 Application topically 2 (two) times daily.   Tums Ultra 1000 1000 MG chewable tablet Generic drug: calcium  elemental as carbonate Chew 1,000 mg by mouth at bedtime.   Vitamin D  50 MCG (2000 UT) Caps Take 2,000 Units by mouth daily.         Objective:   BP 113/71   Pulse 68   Ht 5' 8 (1.727 m)   Wt 180 lb (81.6 kg)   SpO2 96%   BMI 27.37 kg/m   Wt Readings from Last 3 Encounters:  07/08/24 180 lb (81.6 kg)  06/29/24 181 lb 12.8 oz (82.5 kg)  06/01/24 180 lb 14.4 oz (82.1 kg)    Physical Exam Vitals and nursing note reviewed.  Constitutional:      General: He is not in acute distress.    Appearance: He is well-developed. He is not diaphoretic.  Eyes:     General: No scleral icterus.       Right eye: No discharge.     Conjunctiva/sclera: Conjunctivae normal.     Pupils: Pupils are equal, round, and reactive to light.  Neck:     Thyroid : No thyromegaly.  Cardiovascular:     Rate and Rhythm: Normal rate and regular rhythm.     Heart sounds: Normal heart sounds. No murmur heard. Pulmonary:     Effort: Pulmonary effort is normal. No respiratory distress.     Breath sounds: Normal breath sounds. No  wheezing.  Musculoskeletal:        General: Swelling (Trace edema in left lower extremity with inflamed varicose vein on the medial aspect of his left lower leg.) present. Normal range of motion.     Cervical back: Neck supple.  Lymphadenopathy:     Cervical: No cervical adenopathy.  Skin:    General: Skin is warm and dry.     Findings: No rash.  Neurological:     Mental Status: He is alert and  oriented to person, place, and time.     Coordination: Coordination normal.  Psychiatric:        Behavior: Behavior normal.       Assessment & Plan:   Problem List Items Addressed This Visit       Endocrine   Hypothyroidism - Primary   Relevant Orders   CBC with Differential/Platelet   Lipid panel   TSH     Genitourinary   BPH (benign prostatic hyperplasia)   Relevant Orders   PSA, total and free     Other   Hyperlipidemia   Relevant Orders   CBC with Differential/Platelet   CMP14+EGFR   Lipid panel    Will check blood work today, recommend increased Benadryl  and use topical anti-inflammatories for his leg and for his congestion. Blood pressure looks good today, no changes. Follow up plan: Return in about 6 months (around 01/08/2025), or if symptoms worsen or fail to improve, for Physical exam and thyroid .  Counseling provided for all of the vaccine components Orders Placed This Encounter  Procedures   CBC with Differential/Platelet   CMP14+EGFR   Lipid panel   TSH   PSA, total and free    Fonda Levins, MD [facility] 07/08/2024, 9:51 AM

## 2024-07-10 LAB — CBC WITH DIFFERENTIAL/PLATELET
Basophils Absolute: 0.1 x10E3/uL (ref 0.0–0.2)
Basos: 1 %
EOS (ABSOLUTE): 0.4 x10E3/uL (ref 0.0–0.4)
Eos: 5 %
Hematocrit: 42.3 % (ref 37.5–51.0)
Hemoglobin: 13.2 g/dL (ref 13.0–17.7)
Immature Grans (Abs): 0 x10E3/uL (ref 0.0–0.1)
Immature Granulocytes: 0 %
Lymphocytes Absolute: 1.4 x10E3/uL (ref 0.7–3.1)
Lymphs: 20 %
MCH: 29.3 pg (ref 26.6–33.0)
MCHC: 31.2 g/dL — ABNORMAL LOW (ref 31.5–35.7)
MCV: 94 fL (ref 79–97)
Monocytes Absolute: 0.7 x10E3/uL (ref 0.1–0.9)
Monocytes: 9 %
Neutrophils Absolute: 4.8 x10E3/uL (ref 1.4–7.0)
Neutrophils: 65 %
Platelets: 189 x10E3/uL (ref 150–450)
RBC: 4.51 x10E6/uL (ref 4.14–5.80)
RDW: 13.4 % (ref 11.6–15.4)
WBC: 7.4 x10E3/uL (ref 3.4–10.8)

## 2024-07-10 LAB — CMP14+EGFR
ALT: 17 IU/L (ref 0–44)
AST: 26 IU/L (ref 0–40)
Albumin: 4.5 g/dL (ref 3.7–4.7)
Alkaline Phosphatase: 78 IU/L (ref 44–121)
BUN/Creatinine Ratio: 16 (ref 10–24)
BUN: 17 mg/dL (ref 8–27)
Bilirubin Total: 0.5 mg/dL (ref 0.0–1.2)
CO2: 21 mmol/L (ref 20–29)
Calcium: 9.3 mg/dL (ref 8.6–10.2)
Chloride: 102 mmol/L (ref 96–106)
Creatinine, Ser: 1.08 mg/dL (ref 0.76–1.27)
Globulin, Total: 2.1 g/dL (ref 1.5–4.5)
Glucose: 119 mg/dL — ABNORMAL HIGH (ref 70–99)
Potassium: 4.6 mmol/L (ref 3.5–5.2)
Sodium: 143 mmol/L (ref 134–144)
Total Protein: 6.6 g/dL (ref 6.0–8.5)
eGFR: 69 mL/min/1.73 (ref >59)

## 2024-07-10 LAB — TSH: TSH: 4.8 u[IU]/mL — ABNORMAL HIGH (ref 0.450–4.500)

## 2024-07-10 LAB — LIPID PANEL
Chol/HDL Ratio: 3.1 ratio (ref 0.0–5.0)
Cholesterol, Total: 167 mg/dL (ref 100–199)
HDL: 54 mg/dL (ref >39)
LDL Chol Calc (NIH): 85 mg/dL (ref 0–99)
Triglycerides: 167 mg/dL — ABNORMAL HIGH (ref 0–149)
VLDL Cholesterol Cal: 28 mg/dL (ref 5–40)

## 2024-07-10 LAB — PSA, TOTAL AND FREE
PSA, Free Pct: 32 %
PSA, Free: 0.16 ng/mL
Prostate Specific Ag, Serum: 0.5 ng/mL (ref 0.0–4.0)

## 2024-07-13 ENCOUNTER — Telehealth: Payer: Self-pay

## 2024-07-13 NOTE — Telephone Encounter (Signed)
 Please review labs and advise.

## 2024-07-13 NOTE — Telephone Encounter (Signed)
 Left message to return call

## 2024-07-13 NOTE — Telephone Encounter (Signed)
 Copied from CRM #[id number]. Topic: Clinical - Lab/Test Results >> Jul 13, 2024 10:48 AM Montie POUR wrote: Reason for CRM:  Please call Lynda to discuss lab results for her husband. Her number is [phone]. Thanks

## 2024-07-14 ENCOUNTER — Ambulatory Visit: Payer: Self-pay | Admitting: Family Medicine

## 2024-07-14 ENCOUNTER — Telehealth: Payer: Self-pay

## 2024-07-14 NOTE — Telephone Encounter (Signed)
 This has not been done yet.   Per Dr Rosana message, Please increase his levothyroxine  to 150 mcg daily and send in prescription for 6 months.

## 2024-07-14 NOTE — Telephone Encounter (Signed)
 Copied from CRM #[id number]. Topic: Clinical - Prescription Issue >> Jul 14, 2024  2:55 PM Nathanel BROCKS wrote: Reason for CRM: levothyroxine  (SYNTHROID ) 137 MCG tablet  Pt got a message that dose was being increased. They have called to see if it has been called into the pharmacy yet and it has not. Please call it in and advise pt.

## 2024-07-15 MED ORDER — LEVOTHYROXINE SODIUM 150 MCG PO TABS: 150.0000 ug | ORAL_TABLET | Freq: Every day | ORAL | 3 refills | Status: AC

## 2024-07-15 NOTE — Telephone Encounter (Signed)
 Levothyroxine  sent into pharmacy and pt is aware.

## 2024-07-27 DIAGNOSIS — L308 Other specified dermatitis: Secondary | ICD-10-CM | POA: Diagnosis not present

## 2024-07-27 DIAGNOSIS — L821 Other seborrheic keratosis: Secondary | ICD-10-CM | POA: Diagnosis not present

## 2024-07-27 DIAGNOSIS — L300 Nummular dermatitis: Secondary | ICD-10-CM | POA: Diagnosis not present

## 2024-07-27 DIAGNOSIS — D1801 Hemangioma of skin and subcutaneous tissue: Secondary | ICD-10-CM | POA: Diagnosis not present

## 2024-07-27 DIAGNOSIS — L812 Freckles: Secondary | ICD-10-CM | POA: Diagnosis not present

## 2024-07-27 DIAGNOSIS — L570 Actinic keratosis: Secondary | ICD-10-CM | POA: Diagnosis not present

## 2024-07-28 ENCOUNTER — Other Ambulatory Visit: Payer: Self-pay | Admitting: Vascular Surgery

## 2024-08-05 DIAGNOSIS — H401213 Low-tension glaucoma, right eye, severe stage: Secondary | ICD-10-CM | POA: Diagnosis not present

## 2024-08-15 NOTE — Progress Notes (Unsigned)
 Cardiology Office Note:  .   Date:  08/17/2024  ID:  Anthony Noble., DOB [birth year], MRN [id number] PCP: Dettinger, Fonda LABOR, MD  [facility] Providers Cardiologist:  Darryle ONEIDA Decent, MD Structural Heart:  Lonni Cash, MD{ History of Present Illness: .    Chief Complaint  Patient presents with   Follow-up    [facility] Anthony Jr. is a 83 y.o. male with history of TAVR, AAA, CAD who presents for follow-up.    History of Present Illness   Anthony Himes Steinhauser Jr. is an 83 year old male with aortic stenosis status post TAVR, CAD status post PCI, and hyperlipidemia who presents for follow-up.  He has a history of aortic stenosis and underwent a TAVR procedure in March 2025. He reports no chest pain or trouble breathing since the procedure. He maintains an active lifestyle, playing golf three days a week and attending the gym five days a week.  He has a history of coronary artery disease and underwent PCI to the RCA in 2025. He is currently on aspirin  and discontinued Plavix  in July 2025.  He has hyperlipidemia and is currently on Lipitor  80 mg. His most recent lipid panel shows an LDL of 85. He has not taken Zetia  or ezetimibe  before.  He reports issues with his legs, specifically mentioning a 'pouch' on his ankle and a sensation like a 'pin sticking' in his leg. He wears compression stockings most of the time and elevates his legs to manage swelling. He has a family history of varicose veins from his mother.  He has a history of abdominal aortic aneurysm status post EVAR in May 2025 and carotid artery disease with 40-59% stenosis in the right internal carotid artery.  He is not currently monitoring his blood pressure at home but checks his BPMs daily using a watch. He has a Nutritional therapist for monitoring but has used it only a few times.          Problem List Aortic stenosis -26 mm S3 02/10/2024 2. Iatrogenic restrictive VSD -2/2 TAVR procedure  3.  AAA -EVAR 04/2024 4. CAD -PCI RCA 12/25/2023 5. Carotid artery disease  -R ICA 40-59% 6. HLD -T chol 167, HDL 54, LDL 85, TG 167    ROS: All other ROS reviewed and negative. Pertinent positives noted in the HPI.     Studies Reviewed: SABRA   EKG Interpretation Date/Time:  Tuesday August 17 2024 09:41:41 EDT Ventricular Rate:  64 PR Interval:  168 QRS Duration:  86 QT Interval:  434 QTC Calculation: 447 R Axis:   -58  Text Interpretation: Normal sinus rhythm Left anterior fascicular block Confirmed by Decent Darryle [phone]) on 08/17/2024 9:47:22 AM   TTE 03/08/2024  1. Possible small basal setpal VSD as previously noted. Left ventricular  ejection fraction, by estimation, is 70 to 75%. Left ventricular ejection  fraction by PLAX is 73 %. The left ventricle has hyperdynamic function.  The left ventricle has no regional   wall motion abnormalities. Left ventricular diastolic parameters are  consistent with Grade I diastolic dysfunction (impaired relaxation).  Elevated left ventricular end-diastolic pressure. The E/e' is 15.   2. Right ventricular systolic function is normal. The right ventricular  size is normal. There is normal pulmonary artery systolic pressure.   3. Left atrial size was moderately dilated.   4. The mitral valve is grossly normal. No evidence of mitral valve  regurgitation.   5. The aortic valve has been repaired/replaced. Aortic  valve  regurgitation is trivial. There is a 26 mm Sapien prosthetic (TAVR) valve  present in the aortic position. Echo findings are consistent with  perivalvular leak of the aortic prosthesis. Aortic  valve area, by VTI measures 1.69 cm. Aortic valve mean gradient measures  9.4 mmHg. Aortic valve Vmax measures 2.07 m/s. Peak gradient 17.1 mmHg, DI  is 0.54.   6. The inferior vena cava is normal in size with greater than 50%  respiratory variability, suggesting right atrial pressure of 3 mmHg.   Physical Exam:   VS:  BP (!) 140/70 (BP  Location: Right Arm, Patient Position: Sitting, Cuff Size: Normal)   Pulse 74   Ht 5' 9 (1.753 m)   Wt 180 lb 9.6 oz (81.9 kg)   SpO2 93%   BMI 26.67 kg/m    Wt Readings from Last 3 Encounters:  08/17/24 180 lb 9.6 oz (81.9 kg)  07/08/24 180 lb (81.6 kg)  06/29/24 181 lb 12.8 oz (82.5 kg)    GEN: Well nourished, well developed in no acute distress NECK: No JVD; No carotid bruits CARDIAC: RRR, no murmurs, rubs, gallops RESPIRATORY:  Clear to auscultation without rales, wheezing or rhonchi  ABDOMEN: Soft, non-tender, non-distended EXTREMITIES:  No edema; No deformity  ASSESSMENT AND PLAN: .   Assessment and Plan    Mixed hyperlipidemia LDL at 85 mg/dL on Lipitor  80 mg. Goal LDL <70 mg/dL. Discussed adding Zetia  to further lower cholesterol and prevent carotid artery stenosis progression. He agreed to try Zetia  if no side effects occur. - Add Zetia  10 mg daily. - Recheck fasting lipid panel in 6 weeks.  Carotid artery stenosis, right internal carotid, 40-59% Right internal carotid artery stenosis at 40-59%. Plan to monitor progression with annual ultrasound. - Continue monitoring with annual ultrasound. - Follow up with vascular surgery in December.  Coronary artery disease, post-PCI to RCA Status post PCI to RCA in January 2025. Completed Plavix  therapy. Continue aspirin  for secondary prevention. - Continue aspirin  81 mg daily. - lipids as above   Presence of prosthetic aortic heart valve (post-TAVR) with stable iatrogenic ventricular septal defect Status post TAVR with stable iatrogenic VSD. No murmur detected, suggesting VSD may be closed. Valve is stable. Continue aspirin  to prevent thromboembolic events. Advised antibiotics prior to dental procedures to prevent endocarditis. - Continue aspirin  81 mg daily. - Annual follow-up echocardiogram scheduled for March. - Advise antibiotics prior to dental procedures.  Abdominal aortic aneurysm, post-EVAR Status post EVAR for  AAA. - Follow up with vascular surgery in December.  Chronic venous insufficiency with varicose veins, bilateral lower extremities Bilateral varicose veins with swelling and redness. Compression stockings and leg elevation recommended. Reports discomfort and redness, especially in the right leg, uses compression stockings intermittently. - Advise wearing compression stockings. - Advise leg elevation as much as possible.              Follow-up: Return in about 1 year (around 08/17/2025).   Signed, Darryle DASEN. Barbaraann, MD, [facility]  [facility]  [address] [place], [place] [address] [phone]  10:07 AM

## 2024-08-16 DIAGNOSIS — H401221 Low-tension glaucoma, left eye, mild stage: Secondary | ICD-10-CM | POA: Diagnosis not present

## 2024-08-16 DIAGNOSIS — H401213 Low-tension glaucoma, right eye, severe stage: Secondary | ICD-10-CM | POA: Diagnosis not present

## 2024-08-16 DIAGNOSIS — H25813 Combined forms of age-related cataract, bilateral: Secondary | ICD-10-CM | POA: Diagnosis not present

## 2024-08-17 ENCOUNTER — Ambulatory Visit: Attending: Cardiovascular Disease | Admitting: Cardiovascular Disease

## 2024-08-17 ENCOUNTER — Encounter: Payer: Self-pay | Admitting: Cardiovascular Disease

## 2024-08-17 ENCOUNTER — Other Ambulatory Visit: Payer: Self-pay | Admitting: *Deleted

## 2024-08-17 VITALS — BP 140/70 | HR 74 | Ht 69.0 in | Wt 180.6 lb

## 2024-08-17 DIAGNOSIS — Z9861 Coronary angioplasty status: Secondary | ICD-10-CM

## 2024-08-17 DIAGNOSIS — Q210 Ventricular septal defect: Secondary | ICD-10-CM | POA: Diagnosis not present

## 2024-08-17 DIAGNOSIS — E782 Mixed hyperlipidemia: Secondary | ICD-10-CM

## 2024-08-17 DIAGNOSIS — I6521 Occlusion and stenosis of right carotid artery: Secondary | ICD-10-CM | POA: Diagnosis not present

## 2024-08-17 DIAGNOSIS — I2510 Atherosclerotic heart disease of native coronary artery without angina pectoris: Secondary | ICD-10-CM

## 2024-08-17 DIAGNOSIS — Z952 Presence of prosthetic heart valve: Secondary | ICD-10-CM

## 2024-08-17 DIAGNOSIS — I714 Abdominal aortic aneurysm, without rupture, unspecified: Secondary | ICD-10-CM | POA: Diagnosis not present

## 2024-08-17 MED ORDER — EZETIMIBE 10 MG PO TABS: 10.0000 mg | ORAL_TABLET | Freq: Every day | ORAL | 3 refills | Status: AC

## 2024-08-17 NOTE — Patient Instructions (Signed)
 Medication Instructions:  Your physician has recommended you make the following change in your medication: Start ezetimibe  10 mg by mouth daily   *If you need a refill on your cardiac medications before your next appointment, please call your pharmacy*  Lab Work: Have fasting lab work (lipid profile) drawn in the lab on the first floor of our building in 6 weeks.  You do not need an appointment If you have labs (blood work) drawn today and your tests are completely normal, you will receive your results only by: MyChart Message (if you have MyChart) OR A paper copy in the mail If you have any lab test that is abnormal or we need to change your treatment, we will call you to review the results.  Testing/Procedures: none  Follow-Up: At [facility], you and your health needs are our priority.  As part of our continuing mission to provide you with exceptional heart care, our providers are all part of one team.  This team includes your primary Cardiologist (physician) and Advanced Practice Providers or APPs (Physician Assistants and Nurse Practitioners) who all work together to provide you with the care you need, when you need it.  Your next appointment:   12 month(s)  Provider:   Darryle ONEIDA Decent, MD    We recommend signing up for the patient portal called MyChart.  Sign up information is provided on this After Visit Summary.  MyChart is used to connect with patients for Virtual Visits (Telemedicine).  Patients are able to view lab/test results, encounter notes, upcoming appointments, etc.  Non-urgent messages can be sent to your provider as well.   To learn more about what you can do with MyChart, go to [email or web address].   Other Instructions

## 2024-09-13 DIAGNOSIS — H401221 Low-tension glaucoma, left eye, mild stage: Secondary | ICD-10-CM | POA: Diagnosis not present

## 2024-09-13 DIAGNOSIS — H25813 Combined forms of age-related cataract, bilateral: Secondary | ICD-10-CM | POA: Diagnosis not present

## 2024-09-13 DIAGNOSIS — E119 Type 2 diabetes mellitus without complications: Secondary | ICD-10-CM | POA: Diagnosis not present

## 2024-09-13 DIAGNOSIS — H401213 Low-tension glaucoma, right eye, severe stage: Secondary | ICD-10-CM | POA: Diagnosis not present

## 2024-09-28 ENCOUNTER — Other Ambulatory Visit: Payer: Self-pay

## 2024-09-28 DIAGNOSIS — Z952 Presence of prosthetic heart valve: Secondary | ICD-10-CM

## 2024-09-28 DIAGNOSIS — E782 Mixed hyperlipidemia: Secondary | ICD-10-CM | POA: Diagnosis not present

## 2024-09-28 LAB — LIPID PANEL
Chol/HDL Ratio: 2.6 ratio (ref 0.0–5.0)
Cholesterol, Total: 143 mg/dL (ref 100–199)
HDL: 56 mg/dL (ref >39)
LDL Chol Calc (NIH): 71 mg/dL (ref 0–99)
Triglycerides: 85 mg/dL (ref 0–149)
VLDL Cholesterol Cal: 16 mg/dL (ref 5–40)

## 2024-09-29 ENCOUNTER — Ambulatory Visit: Payer: Self-pay | Admitting: Cardiovascular Disease

## 2024-11-14 NOTE — Progress Notes (Unsigned)
 Patient name: Anthony Sedano Eckstein Jr. MRN: [id number] DOB: 1941/11/05 Sex: male  REASON FOR VISIT: 92-month follow-up EVAR duplex and carotid artery surveillance  HPI: Anthony Dallas Murgia Jr. is a 83 y.o. male that presents for 38-month follow-up for EVAR duplex and carotid artery surveillance.  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  He did develop hypotension in the PACU requiring takeback to the OR.  No bleeding source was identified.  Ultimately did well and was discharged.   We are also following a 40 to 59% right ICA stenosis.  Past Medical History:  Diagnosis Date   AAA (abdominal aortic aneurysm)    Anemia    Bilateral carotid artery stenosis without cerebral infarction    vascular--- dr gretta;   hx CVA;  last duplex  in epic 02-11-2023 left ICA 1-39%,  righrt ICA 40-59%,  bilateral ECA >50%   , pt to maintain on ASA 81mg  and statin   BPH (benign prostatic hypertrophy) with urinary obstruction    urologist--- dr watt   CAD S/P percutaneous coronary angioplasty 12/25/2023   s/p PTCA/DES x 1 proximal RCA   Coronary artery disease    Diverticulosis of colon    Glaucoma    Guillain Barr syndrome 2001   Heart murmur 2022   History of bladder cancer 2004   s/p TURBT  and BCG treatment's   History of colon polyps    History of pleural effusion 2001   s/p  VATS w/ drainage   History of vertebral fracture 2001   2001--   T12   Hyperlipidemia    Hypothyroidism    followed by pcp   Pneumonia 2001   S/P TAVR (transcatheter aortic valve replacement) 02/10/2024   s/p TAVR with a 26 mm Edwards S3UR via the TF approach using Sentinel Cerbral Embolic Protection by Dr. Verlin & Dr. Lucas   Severe aortic stenosis    Tinnitus    Varicose veins of both lower extremities     Past Surgical History:  Procedure Laterality Date   ABDOMINAL AORTIC ENDOVASCULAR STENT GRAFT N/A 04/21/2024   Procedure: INSERTION,  ENDOVASCULAR STENT GRAFT, AORTA, ABDOMINAL;  Surgeon: Gretta Lonni PARAS, MD;  Location: [facility] OR;  Service: Vascular;  Laterality: N/A;   AORTOGRAM  04/21/2024   Procedure: AORTOGRAM;  Surgeon: Gretta Lonni PARAS, MD;  Location: [facility] OR;  Service: Vascular;;   CORONARY STENT INTERVENTION N/A 12/25/2023   Procedure: CORONARY STENT INTERVENTION;  Surgeon: Verlin Lonni BIRCH, MD;  Location: [facility] INVASIVE [facility];  Service: Cardiovascular;  Laterality: N/A;   CYSTO/  BILATERAL RETROGRADE PYLOGRAM/  RESECTION BLADDER TUMOR  07/30/2011   @[facility]   by dr watt   CYSTOSCOPY WITH BIOPSY N/A 01/12/2015   Procedure: CYSTOSCOPY WITH BLADDER  BIOPSY, FULGERATION;  Surgeon: Norleen PARAS Watt, MD;  Location: [facility];  Service: Urology;  Laterality: N/A;   CYSTOSCOPY WITH BIOPSY Bilateral 05/13/2023   Procedure: CYSTOSCOPY,BLADDER  BIOPSY WITH FULGURATION, BILATERAL RETROGRADE PYELOGRAM;  Surgeon: Watt Norleen, MD;  Location: [facility] ORS;  Service: Urology;  Laterality: Bilateral;  1 HR FOR CASE   INTRAOPERATIVE TRANSTHORACIC ECHOCARDIOGRAM N/A 02/10/2024   Procedure: ECHOCARDIOGRAM, TRANSTHORACIC;  Surgeon: Verlin Lonni BIRCH, MD;  Location: [facility];  Service: Cardiovascular;  Laterality: N/A;   ORIF RIGHT DISTAL RADIUS FX  12/31/2007   @[facility]   by dr c. blackman   RIGHT HEART CATH AND CORONARY ANGIOGRAPHY N/A 12/25/2023   Procedure: RIGHT  HEART CATH AND CORONARY ANGIOGRAPHY;  Surgeon: Verlin Lonni BIRCH, MD;  Location: [facility];  Service: Cardiovascular;  Laterality: N/A;   TONSILLECTOMY     child   TRANSURETHRAL RESECTION OF BLADDER TUMOR WITH MITOMYCIN -C  06/07/2003   @[facility]  by dr watt   ULTRASOUND GUIDANCE FOR VASCULAR ACCESS  04/21/2024   Procedure: ULTRASOUND GUIDANCE, FOR VASCULAR ACCESS;  Surgeon: Gretta Lonni PARAS, MD;  Location: [facility] OR;  Service: Vascular;;   ULTRASOUND GUIDANCE FOR VASCULAR ACCESS Right 04/21/2024   Procedure: ULTRASOUND GUIDANCE, FOR VASCULAR  ACCESS;  Surgeon: Gretta Lonni PARAS, MD;  Location: [facility] OR;  Service: Vascular;  Laterality: Right;   VIDEO ASSISTED THORACOSCOPY (VATS)/EMPYEMA Right 05/01/2000   @[facility]   by dr hendrickson;  and Drainage of effusion    Family History  Problem Relation Age of Onset   Other Mother        varicose veins   Cancer Mother        Ovarian   Varicose Veins Mother    Nephrolithiasis Mother    COPD Father    Hyperlipidemia Father    Cancer Maternal Grandmother    Bipolar disorder Daughter    Healthy Daughter    Hypertension Sister    Healthy Daughter    Colon cancer Neg Hx     SOCIAL HISTORY: Social History   Tobacco Use   Smoking status: Former    Current packs/day: 0.00    Average packs/day: 1 pack/day for 20.0 years (20.0 ttl pk-yrs)    Types: Cigarettes    Start date: 1963    Quit date: 1983    Years since quitting: 42.9    Passive exposure: Never   Smokeless tobacco: Never  Substance Use Topics   Alcohol use: Yes    Alcohol/week: 2.0 standard drinks of alcohol    Types: 1 Shots of liquor, 1 Standard drinks or equivalent per week    Allergies  Allergen Reactions   Bcg Live Other (See Comments)    Treatment pt had- shakes   Septra [Bactrim] Other (See Comments)    Muscle weakness, numbness   Sulfa Antibiotics Other (See Comments)    Severe weakness   Pertussis Vaccines     History of Gillian barrie    Iodinated Contrast Media Rash    Current Outpatient Medications  Medication Sig Dispense Refill   acetaminophen  (TYLENOL ) 650 MG CR tablet Take 1,300 mg by mouth every 8 (eight) hours as needed for pain.     aspirin  EC 81 MG tablet Take 81 mg by mouth daily. Swallow whole.     atorvastatin  (LIPITOR ) 80 MG tablet Take 1 tablet by mouth once daily 90 tablet 2   betamethasone dipropionate (DIPROLENE) 0.05 % ointment Apply 1 Application topically 2 (two) times daily.     brimonidine  (ALPHAGAN ) 0.2 % ophthalmic solution Place 1 drop into the right eye 3 (three) times  daily.     calcium  elemental as carbonate (TUMS ULTRA 1000) 400 MG chewable tablet Chew 1,000 mg by mouth at bedtime.     cetirizine (ZYRTEC) 10 MG tablet Take 10 mg by mouth daily.     Cholecalciferol (VITAMIN D ) 50 MCG (2000 UT) CAPS Take 2,000 Units by mouth daily.     Coenzyme Q10 (COQ10) 400 MG CAPS Take 400 mg by mouth daily.     CRANBERRY PO Take 500 mg by mouth daily.     diphenhydrAMINE  (BENADRYL ) 25 MG tablet Take 25 mg by mouth every 6 (six) hours as needed  for itching. (Patient not taking: Reported on 08/17/2024)     dorzolamide -timolol  (COSOPT ) 22.3-6.8 MG/ML ophthalmic solution Place 1 drop into both eyes 2 (two) times daily.     ezetimibe  (ZETIA ) 10 MG tablet Take 1 tablet (10 mg total) by mouth daily. 90 tablet 3   ferrous sulfate  325 (65 FE) MG tablet Take 325 mg by mouth daily.     hyoscyamine  (LEVSIN  SL) 0.125 MG SL tablet DISSOLVE 1 TABLET IN MOUTH EVERY 8 HOURS AS NEEDED 120 tablet 1   levothyroxine  (SYNTHROID ) 150 MCG tablet Take 1 tablet (150 mcg total) by mouth daily. 90 tablet 3   Magnesium  400 MG TABS Take 400 mg by mouth daily.     Multiple Vitamin (MULTIVITAMIN WITH MINERALS) TABS tablet Take 1 tablet by mouth daily. Centrum silver 50+     Multiple Vitamins-Minerals (HAIR SKIN NAILS PO) Take 1 tablet by mouth daily.     Netarsudil -Latanoprost  (ROCKLATAN ) 0.02-0.005 % SOLN Place 1 drop into both eyes at bedtime.     NON FORMULARY Take 15 mg by mouth at bedtime. CBD Gummy hemp isolate     NON FORMULARY Take 1 Dose by mouth daily. Take 1/2 of 750 mg CBD gummy 15mg  CBG     Omega-3 Fatty Acids (FISH OIL) 1000 MG CAPS Take 1,000 mg by mouth daily.     sodium chloride  (OCEAN) 0.65 % SOLN nasal spray Place 1 spray into both nostrils as needed for congestion. May continue to use throughout the day as needed for congestion     triamcinolone  cream (KENALOG ) 0.1 % Apply 1 Application topically 2 (two) times daily. 453.6 g 1   No current facility-administered medications for this  visit.    REVIEW OF SYSTEMS:  [X]  denotes positive finding, [ ]  denotes negative finding Cardiac  Comments:  Chest pain or chest pressure:    Shortness of breath upon exertion:    Short of breath when lying flat:    Irregular heart rhythm:        Vascular    Pain in calf, thigh, or hip brought on by ambulation:    Pain in feet at night that wakes you up from your sleep:     Blood clot in your veins:    Leg swelling:         Pulmonary    Oxygen at home:    Productive cough:     Wheezing:         Neurologic    Sudden weakness in arms or legs:     Sudden numbness in arms or legs:     Sudden onset of difficulty speaking or slurred speech:    Temporary loss of vision in one eye:     Problems with dizziness:         Gastrointestinal    Blood in stool:     Vomited blood:         Genitourinary    Burning when urinating:     Blood in urine:        Psychiatric    Major depression:         Hematologic    Bleeding problems:    Problems with blood clotting too easily:        Skin    Rashes or ulcers:        Constitutional    Fever or chills:      PHYSICAL EXAM: There were no vitals filed for this visit.   GENERAL: The patient is a  well-nourished male, in no acute distress. The vital signs are documented above. CARDIAC: There is a regular rate and rhythm.  VASCULAR:  Bilateral femoral pulses palpable, no groin hematoma Bilateral PT pulses palpable PULMONARY: No respiratory distress. ABDOMEN: Soft and non-tender.   DATA:   EVAR duplex  Carotid artery duplex  CTA abdomen pelvis reviewed from 05/20/2024 with aortic stents in good position with exclusion of the aneurysm sac.  Some concern for delayed type II endoleak from the IMA but no growth in aneurysm sac  Assessment/Plan:  83 y.o. male that presents for 6 month follow-up for surveillance of aortic stent and carotid artery surveillance.  Previously underwent aortic stent graft x 2 for 3 cm saccular  aneurysm from PAU on 04/21/2024.  He did develop hypotension in the PACU requiring takeback to the OR.  No bleeding source was identified.  Previously on postop CT had a subtotal type II endoleak.  We have previously been following a 40 to 59% right ICA stenosis.  I discussed his duplex stable today with 40 to 59% right ICA stenosis and 139% left ICA stenosis.  Discussed for asymptomatic right disease recommend surgery is greater than 80%.   Lonni DOROTHA Gaskins, MD [facility] Office: [phone]

## 2024-11-16 ENCOUNTER — Encounter: Payer: Self-pay | Admitting: Vascular Surgery

## 2024-11-16 ENCOUNTER — Ambulatory Visit: Admitting: Vascular Surgery

## 2024-11-16 ENCOUNTER — Ambulatory Visit (HOSPITAL_COMMUNITY)
Admission: RE | Admit: 2024-11-16 | Discharge: 2024-11-16 | Disposition: A | Source: Ambulatory Visit | Attending: Vascular Surgery

## 2024-11-16 VITALS — BP 145/77 | HR 56 | Temp 98.0°F | Resp 20 | Ht 69.0 in | Wt 177.4 lb

## 2024-11-16 DIAGNOSIS — I6523 Occlusion and stenosis of bilateral carotid arteries: Secondary | ICD-10-CM

## 2024-11-16 DIAGNOSIS — I7143 Infrarenal abdominal aortic aneurysm, without rupture: Secondary | ICD-10-CM

## 2024-11-17 ENCOUNTER — Telehealth: Payer: Self-pay | Admitting: Family Medicine

## 2024-11-17 DIAGNOSIS — E7800 Pure hypercholesterolemia, unspecified: Secondary | ICD-10-CM

## 2024-11-17 DIAGNOSIS — I2510 Atherosclerotic heart disease of native coronary artery without angina pectoris: Secondary | ICD-10-CM

## 2024-11-17 DIAGNOSIS — E039 Hypothyroidism, unspecified: Secondary | ICD-10-CM

## 2024-11-17 NOTE — Telephone Encounter (Signed)
 Pt would like appt to go over is medication

## 2024-11-18 NOTE — Telephone Encounter (Signed)
 Placed referral

## 2024-11-18 NOTE — Telephone Encounter (Signed)
 Contacted patient. Notified patient. Patient verbalized understanding.

## 2024-11-23 ENCOUNTER — Telehealth: Payer: Self-pay | Admitting: *Deleted

## 2024-11-23 NOTE — Progress Notes (Unsigned)
 Care Guide Pharmacy Note  11/23/2024 Name: [name] MRN: [id number] DOB: 12/14/1940  Referred By: Maryanne Fonda LABOR, MD Reason for referral: Call Attempt #1 and Complex Care Management (Outreach to schedule referral with pharmacist )   Anthony Macleod Doren Jr. is a 83 y.o. year old male who is a primary care patient of Dettinger, Fonda LABOR, MD.  Anthony Macleod Remmers Jr. was referred to the pharmacist for assistance related to: CAD  An unsuccessful telephone outreach was attempted today to contact the patient who was referred to the pharmacy team for assistance with medication management. Additional attempts will be made to contact the patient.  Thedford Franks, CMA [place]  [facility], [facility] Guide Direct Dial: [phone]  Fax: [phone] Website: [email or web address]

## 2024-11-24 NOTE — Progress Notes (Signed)
 Care Guide Pharmacy Note  11/24/2024 Name: [facility] Jr. MRN: [id number] DOB: 10/29/1941  Referred By: Maryanne Fonda LABOR, MD Reason for referral: Call Attempt #1 and Complex Care Management (Outreach to schedule referral with pharmacist )   Anthony Macleod Tantillo Jr. is a 83 y.o. year old male who is a primary care patient of Dettinger, Fonda LABOR, MD.  Anthony Macleod Ricardo Jr. was referred to the pharmacist for assistance related to: CAD  Successful contact was made with the patient to discuss pharmacy services including being ready for the pharmacist to call at least 5 minutes before the scheduled appointment time and to have medication bottles and any blood pressure readings ready for review. The patient agreed to meet with the pharmacist via telephone visit on 12/07/2024  Thedford Franks, CMA [place]  [facility], [facility] Guide Direct Dial: [phone]  Fax: [phone] Website: [email or web address]

## 2024-12-07 ENCOUNTER — Other Ambulatory Visit

## 2024-12-16 ENCOUNTER — Telehealth: Payer: Self-pay | Admitting: Pharmacist

## 2024-12-16 ENCOUNTER — Other Ambulatory Visit

## 2024-12-16 DIAGNOSIS — L240 Irritant contact dermatitis due to detergents: Secondary | ICD-10-CM

## 2024-12-16 MED ORDER — DIPHENHYDRAMINE HCL 25 MG PO TABS: 25.0000 mg | ORAL_TABLET | Freq: Four times a day (QID) | ORAL | Status: AC | PRN

## 2024-12-16 MED ORDER — TRIAMCINOLONE ACETONIDE 0.1 % EX CREA: 1.0000 | TOPICAL_CREAM | Freq: Two times a day (BID) | CUTANEOUS | 1 refills | Status: AC

## 2024-12-16 NOTE — Progress Notes (Unsigned)
 "  12/16/2024 Name: Anthony [facility] Mengel Jr. MRN: [id number] DOB: 16-Jan-1941  Chief Complaint  Patient presents with   Hyperlipidemia   Medication Management    [facility] Anthony Jr. is a 84 y.o. year old male who presented for a telephone visit. I connected with  Anthony Dallas Routson Jr. on 12/16/2024 by telephone and verified that I am speaking with the correct person using two identifiers.  I discussed the limitations of evaluation and management by telemedicine. The patient expressed understanding and agreed to proceed.  Patient was located in her home and PharmD in PCP office during this visit.   They were referred to the pharmacist by their PCP for assistance in managing hyperlipidemia/cardiovascular risk reduction and complex medication management.   Subjective:  Care Team: Primary Care Provider: Dettinger, Fonda LABOR, MD  {careteamprovider:[id number]}  Medication Access/Adherence  Current Pharmacy:  [facility] [address] [place] - [address] [place] [address] Phone: [phone] Fax: [phone]  [facility] [address] [place] [address] Phone: [phone] Fax: [phone]   Patient reports affordability concerns with their medications: No  Patient reports access/transportation concerns to their pharmacy: No  Patient reports adherence concerns with their medications:  No     Hyperlipidemia/ASCVD Risk Reduction  Current lipid lowering medications: atorvastatin , ezetimibe   Medications tried in the past:   Antiplatelet regimen:   ASCVD History:  Family History:  Risk Factors:   Current physical activity: encouraged as able   Current medication access support: ***  PREVENT Risk Score:  [email or web address] - 10 year risk of CVD: *** - 10 year risk of ASCVD: *** - 10 year risk of HF: ***   Objective:  No results  found for: HGBA1C  Lab Results  Component Value Date   CREATININE 1.08 07/08/2024   BUN 17 07/08/2024   NA 143 07/08/2024   K 4.6 07/08/2024   CL 102 07/08/2024   CO2 21 07/08/2024    Lab Results  Component Value Date   CHOL 143 09/28/2024   HDL 56 09/28/2024   LDLCALC 71 09/28/2024   TRIG 85 09/28/2024   CHOLHDL 2.6 09/28/2024    Medications Reviewed Today     Reviewed by Billee Mliss BIRCH, RPH-CPP (Pharmacist) on 12/16/24 at 1332  Med List Status: <None>   Medication Order Taking? Sig Documenting Provider Last Dose Status Informant  acetaminophen  (TYLENOL ) 650 MG CR tablet [id number]  Take 1,300 mg by mouth every 8 (eight) hours as needed for pain. [provider]  Active Spouse/Significant Other  aspirin  EC 81 MG tablet [id number]  Take 81 mg by mouth daily. Swallow whole. [provider]  Active Spouse/Significant Other  atorvastatin  (LIPITOR ) 80 MG tablet [id number]  Take 1 tablet by mouth once daily Sebastian Lamarr SAUNDERS, PA-C  Active   brimonidine  (ALPHAGAN ) 0.2 % ophthalmic solution [id number]  Place 1 drop into the right eye 3 (three) times daily. [provider]  Active Spouse/Significant Other  calcium  elemental as carbonate (TUMS ULTRA 1000) 400 MG chewable tablet [id number]  Chew 1,000 mg by mouth at bedtime. [provider]  Active Spouse/Significant Other  cetirizine (ZYRTEC) 10 MG tablet [id number]  Take 10 mg by mouth daily. [provider]  Active   Cholecalciferol (VITAMIN D ) 50 MCG (2000 UT) CAPS [id number]  Take 2,000 Units by mouth daily. [provider]  Active Spouse/Significant Other  Coenzyme Q10 (COQ10) 400 MG CAPS [id number]  Take 400 mg by mouth  daily. [provider]  Active Spouse/Significant Other  CRANBERRY PO [id number]  Take 500 mg by mouth daily. [provider]  Active Spouse/Significant Other  dorzolamide -timolol  (COSOPT ) 22.3-6.8 MG/ML ophthalmic solution [id number]  Place 1 drop into  both eyes 2 (two) times daily. [provider]  Active Spouse/Significant Other  ezetimibe  (ZETIA ) 10 MG tablet [id number]  Take 1 tablet (10 mg total) by mouth daily. Barbaraann Darryle Ned, MD  Active   ferrous sulfate  325 (65 FE) MG tablet [id number]  Take 325 mg by mouth daily. [provider]  Active Spouse/Significant Other  hyoscyamine  (LEVSIN  SL) 0.125 MG SL tablet [id number]  DISSOLVE 1 TABLET IN MOUTH EVERY 8 HOURS AS NEEDED Dettinger, Fonda LABOR, MD  Active   levothyroxine  (SYNTHROID ) 150 MCG tablet [id number]  Take 1 tablet (150 mcg total) by mouth daily. Dettinger, Fonda LABOR, MD  Active   Magnesium  400 MG TABS [id number]  Take 400 mg by mouth daily. [provider]  Active Spouse/Significant Other  Multiple Vitamin (MULTIVITAMIN WITH MINERALS) TABS tablet [id number]  Take 1 tablet by mouth daily. Centrum silver 50+ [provider]  Active Spouse/Significant Other  Multiple Vitamins-Minerals (HAIR SKIN NAILS PO) [id number]  Take 1 tablet by mouth daily. [provider]  Active Spouse/Significant Other  Netarsudil -Latanoprost  (ROCKLATAN ) 0.02-0.005 % SOLN [id number]  Place 1 drop into both eyes at bedtime. [provider]  Active Spouse/Significant Other  NON FORMULARY [id number]  Take 15 mg by mouth at bedtime. CBD Gummy hemp isolate [provider]  Active Spouse/Significant Other  NON FORMULARY [id number]  Take 1 Dose by mouth daily. Take 1/2 of 750 mg CBD gummy 15mg  CBG [provider]  Active   Omega-3 Fatty Acids (FISH OIL) 1000 MG CAPS [id number]  Take 1,000 mg by mouth daily. [provider]  Active Spouse/Significant Other  sodium chloride  (OCEAN) 0.65 % SOLN nasal spray [id number]  Place 1 spray into both nostrils as needed for congestion. May continue to use throughout the day as needed for congestion [provider]  Active Spouse/Significant Other  triamcinolone  cream (KENALOG ) 0.1 % [id number]  Apply 1  Application topically 2 (two) times daily. Gladis Mustard, FNP  Active Spouse/Significant Other              Assessment/Plan:   {Pharmacy A/P Choices:26421} Refill requested   Follow Up Plan: ***  ***  "

## 2025-01-04 ENCOUNTER — Telehealth: Payer: Self-pay

## 2025-01-04 NOTE — Telephone Encounter (Signed)
"  ° °  Pre-operative Risk Assessment    Patient Name: Anthony Kraynak Buley Jr.  DOB: 12-12-1940 MRN: [id number]   Date of last office visit: 08/17/24 Date of next office visit: 02/10/25   Request for Surgical Clearance    Procedure:  Cataract/Glaucoma Surgery  Date of Surgery:  Clearance 02/10/25 left eye, 03/01/25 right eye                               Surgeon:  Not provided Surgeon's Group or Practice Name:  [facility] Phone number:  [phone] Fax number:  [phone]   Type of Clearance Requested:   - Medical    Type of Anesthesia:  Not Indicated   Additional requests/questions:    Anthony Noble   01/04/2025, 4:53 PM   "

## 2025-01-05 NOTE — Telephone Encounter (Signed)
"  ° °  Patient Name: Anthony Donatelli Delano Jr.  DOB: [birth year] MRN: [id number]  Primary Cardiologist: Darryle ONEIDA Decent, MD  Chart reviewed as part of pre-operative protocol coverage. Cataract extractions are recognized in guidelines as low risk surgeries that do not typically require specific preoperative testing or holding of blood thinner therapy. Therefore, given past medical history and time since last visit, based on ACC/AHA guidelines, Anthony Dallas Kniss Jr. would be at acceptable risk for the planned procedure without further cardiovascular testing.   I will route this recommendation to the requesting party via Epic fax function and remove from pre-op pool.  Please call with questions.  Emmary Culbreath E Derinda Bartus, PA-C 01/05/2025, 7:49 AM  "

## 2025-02-10 ENCOUNTER — Other Ambulatory Visit (HOSPITAL_COMMUNITY)

## 2025-02-10 ENCOUNTER — Ambulatory Visit: Admitting: Physician Assistant
